# Patient Record
Sex: Female | Born: 1945 | Race: White | Hispanic: No | Smoking: Never smoker
Health system: Southern US, Community
[De-identification: ages and names within clinical notes are randomized; demographics above are authoritative.]

## PROBLEM LIST (undated history)

## (undated) DIAGNOSIS — F419 Anxiety disorder, unspecified: Secondary | ICD-10-CM

## (undated) DIAGNOSIS — I10 Essential (primary) hypertension: Secondary | ICD-10-CM

## (undated) DIAGNOSIS — Z9889 Other specified postprocedural states: Secondary | ICD-10-CM

## (undated) DIAGNOSIS — I499 Cardiac arrhythmia, unspecified: Secondary | ICD-10-CM

## (undated) DIAGNOSIS — Z87442 Personal history of urinary calculi: Secondary | ICD-10-CM

## (undated) DIAGNOSIS — M1990 Unspecified osteoarthritis, unspecified site: Secondary | ICD-10-CM

## (undated) DIAGNOSIS — R112 Nausea with vomiting, unspecified: Secondary | ICD-10-CM

## (undated) DIAGNOSIS — J189 Pneumonia, unspecified organism: Secondary | ICD-10-CM

## (undated) HISTORY — PX: OTHER SURGICAL HISTORY: SHX169

---

## 1998-06-29 ENCOUNTER — Other Ambulatory Visit: Admission: RE | Admit: 1998-06-29 | Discharge: 1998-06-29 | Payer: Self-pay | Admitting: Obstetrics and Gynecology

## 1998-08-03 ENCOUNTER — Ambulatory Visit (HOSPITAL_COMMUNITY): Admission: RE | Admit: 1998-08-03 | Discharge: 1998-08-03 | Payer: Self-pay | Admitting: Obstetrics and Gynecology

## 1998-09-29 ENCOUNTER — Ambulatory Visit (HOSPITAL_COMMUNITY): Admission: RE | Admit: 1998-09-29 | Discharge: 1998-09-29 | Payer: Self-pay | Admitting: Obstetrics and Gynecology

## 1999-09-21 ENCOUNTER — Other Ambulatory Visit: Admission: RE | Admit: 1999-09-21 | Discharge: 1999-09-21 | Payer: Self-pay | Admitting: Obstetrics and Gynecology

## 2000-11-05 ENCOUNTER — Other Ambulatory Visit: Admission: RE | Admit: 2000-11-05 | Discharge: 2000-11-05 | Payer: Self-pay | Admitting: Gynecology

## 2002-01-07 ENCOUNTER — Other Ambulatory Visit: Admission: RE | Admit: 2002-01-07 | Discharge: 2002-01-07 | Payer: Self-pay | Admitting: Gynecology

## 2016-08-03 ENCOUNTER — Ambulatory Visit (HOSPITAL_BASED_OUTPATIENT_CLINIC_OR_DEPARTMENT_OTHER): Payer: Self-pay | Admitting: Urology

## 2016-08-03 ENCOUNTER — Emergency Department (HOSPITAL_COMMUNITY): Payer: Medicare Other

## 2016-08-03 ENCOUNTER — Emergency Department (HOSPITAL_COMMUNITY): Payer: Medicare Other | Admitting: Certified Registered Nurse Anesthetist

## 2016-08-03 ENCOUNTER — Encounter (HOSPITAL_COMMUNITY)
Admission: EM | Disposition: A | Discharge status: Skilled Nursing Facility | Payer: Self-pay | Source: Home / Self Care | Attending: Internal Medicine

## 2016-08-03 ENCOUNTER — Inpatient Hospital Stay (HOSPITAL_COMMUNITY)
Admission: EM | Admit: 2016-08-03 | Discharge: 2016-08-09 | DRG: 871 | Disposition: A | Discharge status: Skilled Nursing Facility | Payer: Medicare Other | Attending: Internal Medicine | Admitting: Internal Medicine

## 2016-08-03 DIAGNOSIS — Z6831 Body mass index (BMI) 31.0-31.9, adult: Secondary | ICD-10-CM | POA: Diagnosis not present

## 2016-08-03 DIAGNOSIS — A4181 Sepsis due to Enterococcus: Secondary | ICD-10-CM | POA: Diagnosis not present

## 2016-08-03 DIAGNOSIS — F419 Anxiety disorder, unspecified: Secondary | ICD-10-CM | POA: Diagnosis present

## 2016-08-03 DIAGNOSIS — N132 Hydronephrosis with renal and ureteral calculous obstruction: Secondary | ICD-10-CM | POA: Diagnosis not present

## 2016-08-03 DIAGNOSIS — N136 Pyonephrosis: Secondary | ICD-10-CM | POA: Diagnosis present

## 2016-08-03 DIAGNOSIS — J9601 Acute respiratory failure with hypoxia: Secondary | ICD-10-CM | POA: Diagnosis not present

## 2016-08-03 DIAGNOSIS — R188 Other ascites: Secondary | ICD-10-CM | POA: Diagnosis not present

## 2016-08-03 DIAGNOSIS — I10 Essential (primary) hypertension: Secondary | ICD-10-CM | POA: Diagnosis present

## 2016-08-03 DIAGNOSIS — M6281 Muscle weakness (generalized): Secondary | ICD-10-CM | POA: Diagnosis not present

## 2016-08-03 DIAGNOSIS — J9811 Atelectasis: Secondary | ICD-10-CM | POA: Diagnosis present

## 2016-08-03 DIAGNOSIS — G9341 Metabolic encephalopathy: Secondary | ICD-10-CM | POA: Diagnosis present

## 2016-08-03 DIAGNOSIS — Z4682 Encounter for fitting and adjustment of non-vascular catheter: Secondary | ICD-10-CM | POA: Diagnosis not present

## 2016-08-03 DIAGNOSIS — K529 Noninfective gastroenteritis and colitis, unspecified: Secondary | ICD-10-CM | POA: Diagnosis present

## 2016-08-03 DIAGNOSIS — E162 Hypoglycemia, unspecified: Secondary | ICD-10-CM | POA: Diagnosis not present

## 2016-08-03 DIAGNOSIS — E861 Hypovolemia: Secondary | ICD-10-CM | POA: Diagnosis present

## 2016-08-03 DIAGNOSIS — R410 Disorientation, unspecified: Secondary | ICD-10-CM | POA: Diagnosis not present

## 2016-08-03 DIAGNOSIS — R7989 Other specified abnormal findings of blood chemistry: Secondary | ICD-10-CM | POA: Diagnosis present

## 2016-08-03 DIAGNOSIS — R140 Abdominal distension (gaseous): Secondary | ICD-10-CM | POA: Diagnosis not present

## 2016-08-03 DIAGNOSIS — R6520 Severe sepsis without septic shock: Secondary | ICD-10-CM

## 2016-08-03 DIAGNOSIS — N179 Acute kidney failure, unspecified: Secondary | ICD-10-CM | POA: Diagnosis present

## 2016-08-03 DIAGNOSIS — D696 Thrombocytopenia, unspecified: Secondary | ICD-10-CM | POA: Diagnosis present

## 2016-08-03 DIAGNOSIS — E669 Obesity, unspecified: Secondary | ICD-10-CM | POA: Diagnosis present

## 2016-08-03 DIAGNOSIS — R7881 Bacteremia: Secondary | ICD-10-CM | POA: Diagnosis present

## 2016-08-03 DIAGNOSIS — N202 Calculus of kidney with calculus of ureter: Secondary | ICD-10-CM | POA: Diagnosis not present

## 2016-08-03 DIAGNOSIS — E870 Hyperosmolality and hypernatremia: Secondary | ICD-10-CM | POA: Diagnosis present

## 2016-08-03 DIAGNOSIS — Z9289 Personal history of other medical treatment: Secondary | ICD-10-CM

## 2016-08-03 DIAGNOSIS — J9600 Acute respiratory failure, unspecified whether with hypoxia or hypercapnia: Secondary | ICD-10-CM | POA: Diagnosis not present

## 2016-08-03 DIAGNOSIS — J9690 Respiratory failure, unspecified, unspecified whether with hypoxia or hypercapnia: Secondary | ICD-10-CM | POA: Diagnosis not present

## 2016-08-03 DIAGNOSIS — F331 Major depressive disorder, recurrent, moderate: Secondary | ICD-10-CM | POA: Diagnosis not present

## 2016-08-03 DIAGNOSIS — N10 Acute tubulo-interstitial nephritis: Secondary | ICD-10-CM

## 2016-08-03 DIAGNOSIS — K922 Gastrointestinal hemorrhage, unspecified: Secondary | ICD-10-CM | POA: Diagnosis not present

## 2016-08-03 DIAGNOSIS — E872 Acidosis: Secondary | ICD-10-CM | POA: Diagnosis present

## 2016-08-03 DIAGNOSIS — E876 Hypokalemia: Secondary | ICD-10-CM | POA: Diagnosis present

## 2016-08-03 DIAGNOSIS — E860 Dehydration: Secondary | ICD-10-CM | POA: Diagnosis present

## 2016-08-03 DIAGNOSIS — K625 Hemorrhage of anus and rectum: Secondary | ICD-10-CM

## 2016-08-03 DIAGNOSIS — R404 Transient alteration of awareness: Secondary | ICD-10-CM | POA: Diagnosis not present

## 2016-08-03 DIAGNOSIS — I248 Other forms of acute ischemic heart disease: Secondary | ICD-10-CM | POA: Diagnosis present

## 2016-08-03 DIAGNOSIS — R030 Elevated blood-pressure reading, without diagnosis of hypertension: Secondary | ICD-10-CM | POA: Diagnosis present

## 2016-08-03 DIAGNOSIS — R6521 Severe sepsis with septic shock: Secondary | ICD-10-CM | POA: Diagnosis present

## 2016-08-03 DIAGNOSIS — A4151 Sepsis due to Escherichia coli [E. coli]: Principal | ICD-10-CM | POA: Diagnosis present

## 2016-08-03 DIAGNOSIS — K921 Melena: Secondary | ICD-10-CM | POA: Diagnosis not present

## 2016-08-03 DIAGNOSIS — A419 Sepsis, unspecified organism: Secondary | ICD-10-CM

## 2016-08-03 DIAGNOSIS — R918 Other nonspecific abnormal finding of lung field: Secondary | ICD-10-CM | POA: Diagnosis not present

## 2016-08-03 DIAGNOSIS — R2689 Other abnormalities of gait and mobility: Secondary | ICD-10-CM | POA: Diagnosis not present

## 2016-08-03 DIAGNOSIS — E161 Other hypoglycemia: Secondary | ICD-10-CM | POA: Diagnosis not present

## 2016-08-03 DIAGNOSIS — N12 Tubulo-interstitial nephritis, not specified as acute or chronic: Secondary | ICD-10-CM | POA: Diagnosis not present

## 2016-08-03 DIAGNOSIS — R4182 Altered mental status, unspecified: Secondary | ICD-10-CM | POA: Diagnosis not present

## 2016-08-03 DIAGNOSIS — Z452 Encounter for adjustment and management of vascular access device: Secondary | ICD-10-CM | POA: Diagnosis not present

## 2016-08-03 DIAGNOSIS — N200 Calculus of kidney: Secondary | ICD-10-CM | POA: Diagnosis not present

## 2016-08-03 HISTORY — PX: CYSTOSCOPY W/ URETERAL STENT PLACEMENT: SHX1429

## 2016-08-03 LAB — URINALYSIS, ROUTINE W REFLEX MICROSCOPIC
BILIRUBIN URINE: NEGATIVE
GLUCOSE, UA: NEGATIVE mg/dL
KETONES UR: NEGATIVE mg/dL
NITRITE: NEGATIVE
PH: 5.5 (ref 5.0–8.0)
PROTEIN: 100 mg/dL — AB
Specific Gravity, Urine: 1.025 (ref 1.005–1.030)

## 2016-08-03 LAB — COMPREHENSIVE METABOLIC PANEL
ALT: 70 U/L — AB (ref 14–54)
ANION GAP: 17 — AB (ref 5–15)
AST: 112 U/L — ABNORMAL HIGH (ref 15–41)
Albumin: 2.6 g/dL — ABNORMAL LOW (ref 3.5–5.0)
Alkaline Phosphatase: 188 U/L — ABNORMAL HIGH (ref 38–126)
BUN: 55 mg/dL — ABNORMAL HIGH (ref 6–20)
CHLORIDE: 100 mmol/L — AB (ref 101–111)
CO2: 17 mmol/L — AB (ref 22–32)
Calcium: 7.7 mg/dL — ABNORMAL LOW (ref 8.9–10.3)
Creatinine, Ser: 3.89 mg/dL — ABNORMAL HIGH (ref 0.44–1.00)
GFR, EST AFRICAN AMERICAN: 13 mL/min — AB (ref >60)
GFR, EST NON AFRICAN AMERICAN: 11 mL/min — AB (ref >60)
Glucose, Bld: 127 mg/dL — ABNORMAL HIGH (ref 65–99)
POTASSIUM: 3.1 mmol/L — AB (ref 3.5–5.1)
SODIUM: 134 mmol/L — AB (ref 135–145)
Total Bilirubin: 0.8 mg/dL (ref 0.3–1.2)
Total Protein: 6.1 g/dL — ABNORMAL LOW (ref 6.5–8.1)

## 2016-08-03 LAB — CBC WITH DIFFERENTIAL/PLATELET
BASOS ABS: 0 10*3/uL (ref 0.0–0.1)
Basophils Relative: 0 %
Eosinophils Absolute: 0 10*3/uL (ref 0.0–0.7)
Eosinophils Relative: 0 %
HCT: 33.8 % — ABNORMAL LOW (ref 36.0–46.0)
HEMOGLOBIN: 10.8 g/dL — AB (ref 12.0–15.0)
LYMPHS PCT: 5 %
Lymphs Abs: 0.3 10*3/uL — ABNORMAL LOW (ref 0.7–4.0)
MCH: 25.1 pg — AB (ref 26.0–34.0)
MCHC: 32 g/dL (ref 30.0–36.0)
MCV: 78.4 fL (ref 78.0–100.0)
MONOS PCT: 2 %
Monocytes Absolute: 0.1 10*3/uL (ref 0.1–1.0)
NEUTROS ABS: 5.5 10*3/uL (ref 1.7–7.7)
NEUTROS PCT: 93 %
PLATELETS: 41 10*3/uL — AB (ref 150–400)
RBC: 4.31 MIL/uL (ref 3.87–5.11)
RDW: 17.2 % — ABNORMAL HIGH (ref 11.5–15.5)
WBC MORPHOLOGY: INCREASED
WBC: 5.9 10*3/uL (ref 4.0–10.5)

## 2016-08-03 LAB — URINALYSIS, MICROSCOPIC (REFLEX)

## 2016-08-03 LAB — I-STAT CG4 LACTIC ACID, ED
LACTIC ACID, VENOUS: 6.64 mmol/L — AB (ref 0.5–1.9)
LACTIC ACID, VENOUS: 3.26 mmol/L — AB (ref 0.5–1.9)

## 2016-08-03 LAB — PROTIME-INR
INR: 1.64
PROTHROMBIN TIME: 19.6 s — AB (ref 11.4–15.2)

## 2016-08-03 LAB — CBG MONITORING, ED: Glucose-Capillary: 112 mg/dL — ABNORMAL HIGH (ref 65–99)

## 2016-08-03 SURGERY — CYSTOSCOPY, FLEXIBLE, WITH STENT REPLACEMENT
Anesthesia: General | Laterality: Bilateral

## 2016-08-03 MED ORDER — ROCURONIUM BROMIDE 10 MG/ML (PF) SYRINGE
PREFILLED_SYRINGE | INTRAVENOUS | Status: DC | PRN
  Administered 2016-08-03: 50 mg via INTRAVENOUS

## 2016-08-03 MED ORDER — PIPERACILLIN-TAZOBACTAM 3.375 G IVPB
3.3750 g | Freq: Three times a day (TID) | INTRAVENOUS | Status: DC
  Administered 2016-08-04 – 2016-08-07 (×10): 3.375 g via INTRAVENOUS
  Filled 2016-08-03 (×10): qty 50

## 2016-08-03 MED ORDER — SODIUM CHLORIDE 0.9 % IV BOLUS (SEPSIS)
1000.0000 mL | Freq: Once | INTRAVENOUS | Status: AC
  Administered 2016-08-03: 1000 mL via INTRAVENOUS

## 2016-08-03 MED ORDER — PHENYLEPHRINE 40 MCG/ML (10ML) SYRINGE FOR IV PUSH (FOR BLOOD PRESSURE SUPPORT)
PREFILLED_SYRINGE | INTRAVENOUS | Status: DC | PRN
  Administered 2016-08-03: 120 ug via INTRAVENOUS
  Administered 2016-08-03: 200 ug via INTRAVENOUS
  Administered 2016-08-03: 160 ug via INTRAVENOUS

## 2016-08-03 MED ORDER — VANCOMYCIN HCL IN DEXTROSE 1-5 GM/200ML-% IV SOLN: 1000.0000 mg | INTRAVENOUS | Status: DC

## 2016-08-03 MED ORDER — SODIUM CHLORIDE 0.9 % IV SOLN
Freq: Once | INTRAVENOUS | Status: AC
  Administered 2016-08-03: 22:00:00 via INTRAVENOUS

## 2016-08-03 MED ORDER — ACETAMINOPHEN 650 MG RE SUPP
650.0000 mg | Freq: Once | RECTAL | Status: AC
  Administered 2016-08-03: 650 mg via RECTAL
  Filled 2016-08-03: qty 1

## 2016-08-03 MED ORDER — PHENYLEPHRINE HCL 10 MG/ML IJ SOLN
INTRAVENOUS | Status: DC | PRN
  Administered 2016-08-03: 30 ug/min via INTRAVENOUS

## 2016-08-03 MED ORDER — VANCOMYCIN HCL IN DEXTROSE 1-5 GM/200ML-% IV SOLN
1000.0000 mg | Freq: Once | INTRAVENOUS | Status: AC
  Administered 2016-08-03: 1000 mg via INTRAVENOUS
  Filled 2016-08-03: qty 200

## 2016-08-03 MED ORDER — PIPERACILLIN-TAZOBACTAM 3.375 G IVPB 30 MIN
3.3750 g | Freq: Once | INTRAVENOUS | Status: AC
  Administered 2016-08-03: 3.375 g via INTRAVENOUS
  Filled 2016-08-03: qty 50

## 2016-08-03 MED ORDER — SODIUM CHLORIDE 0.9 % IR SOLN
Status: DC | PRN
  Administered 2016-08-03: 3000 mL via INTRAVESICAL

## 2016-08-03 MED ORDER — PROPOFOL 10 MG/ML IV BOLUS
INTRAVENOUS | Status: DC | PRN
  Administered 2016-08-03: 140 mg via INTRAVENOUS

## 2016-08-03 MED ORDER — SUCCINYLCHOLINE CHLORIDE 20 MG/ML IJ SOLN
INTRAMUSCULAR | Status: DC | PRN
  Administered 2016-08-03: 100 mg via INTRAVENOUS

## 2016-08-03 MED ORDER — SODIUM CHLORIDE 0.9 % IV BOLUS (SEPSIS)
250.0000 mL | Freq: Once | INTRAVENOUS | Status: AC
  Administered 2016-08-03: 250 mL via INTRAVENOUS

## 2016-08-03 MED ORDER — SODIUM CHLORIDE 0.9 % IV SOLN
INTRAVENOUS | Status: DC | PRN
  Administered 2016-08-03: 13 mL

## 2016-08-03 MED ORDER — FENTANYL CITRATE (PF) 100 MCG/2ML IJ SOLN
INTRAMUSCULAR | Status: DC | PRN
  Administered 2016-08-03: 100 ug via INTRAVENOUS

## 2016-08-03 MED ORDER — LIDOCAINE 2% (20 MG/ML) 5 ML SYRINGE
INTRAMUSCULAR | Status: DC | PRN
  Administered 2016-08-03: 50 mg via INTRAVENOUS

## 2016-08-03 MED ORDER — SODIUM BICARBONATE 8.4 % IV SOLN
INTRAVENOUS | Status: DC | PRN
  Administered 2016-08-03: 50 meq via INTRAVENOUS

## 2016-08-03 MED ORDER — MIDAZOLAM HCL 5 MG/5ML IJ SOLN
INTRAMUSCULAR | Status: DC | PRN
  Administered 2016-08-03: 2 mg via INTRAVENOUS

## 2016-08-03 MED ORDER — LACTATED RINGERS IV SOLN
INTRAVENOUS | Status: DC | PRN
  Administered 2016-08-03: 23:00:00 via INTRAVENOUS

## 2016-08-03 SURGICAL SUPPLY — 15 items
BAG URO CATCHER STRL LF (MISCELLANEOUS) ×2 IMPLANT
CATH FOLEY 2WAY SLVR  5CC 16FR (CATHETERS) ×1
CATH FOLEY 2WAY SLVR 5CC 16FR (CATHETERS) IMPLANT
CATH INTERMIT  6FR 70CM (CATHETERS) ×2 IMPLANT
CLOTH BEACON ORANGE TIMEOUT ST (SAFETY) ×2 IMPLANT
GLOVE BIOGEL M 8.0 STRL (GLOVE) ×2 IMPLANT
GOWN STRL REUS W/ TWL XL LVL3 (GOWN DISPOSABLE) ×1 IMPLANT
GOWN STRL REUS W/TWL XL LVL3 (GOWN DISPOSABLE) ×2
GUIDEWIRE STR DUAL SENSOR (WIRE) ×2 IMPLANT
MANIFOLD NEPTUNE II (INSTRUMENTS) ×2 IMPLANT
PACK CYSTO (CUSTOM PROCEDURE TRAY) ×2 IMPLANT
STENT URET 6FRX24 CONTOUR (STENTS) ×2 IMPLANT
SYR CONTROL 10ML LL (SYRINGE) ×1 IMPLANT
TUBING CONNECTING 10 (TUBING) ×2 IMPLANT
WATER STERILE IRR 500ML POUR (IV SOLUTION) ×1 IMPLANT

## 2016-08-03 NOTE — Discharge Instructions (Signed)

## 2016-08-03 NOTE — Progress Notes (Signed)
Called by [facility] ED physician Dr Oleta Mouse for admitting this patient tonight after trip to OR for ureteral stents. She presented with N/V/D and AMS.  In ED BP's good but tachy and tachypneic. Cr 3.8.  Has rec'd sepsis protocol IVF's and IV abx w vanc/ zosyn.  HR better. CT abd showed obstructing L renal stone with hydro, and extra renal pelvis on the R. Probable urosepsis.  Urology is going to take her from [facility] ED straight to OR for ureteral stent placement, then is to be admitted by [facility] after surgery.  Please call [facility] at [phone] after pt is done in the OR tonight.   Kelly Splinter MD [facility] 08/03/2016, 10:08 PM

## 2016-08-03 NOTE — ED Provider Notes (Addendum)
[place] DEPT Provider Note   CSN: [id number] Arrival date & time: 08/03/16  1659     History   Chief Complaint Chief Complaint  Patient presents with  . Altered Mental Status    HPI Regina Richards is a 70 y.o. female.  HPI Level V caveat due to AMS. History is provided by patient sisters. She has history of anxiety. They last saw her normal on Monday when she went to a concert with her sisters. On Tuesday she told them that she had some nausea and vomiting and diarrhea and was feeling unwell.  They have been unable to get a hold of her via telephone for the past 24 hours, and they came to find her today. She was sitting down without pants on, was confused and altered. Patient oriented to self. Unable to provide any history. Denies any pain.  No past medical history on file.  There are no active problems to display for this patient.   No past surgical history on file.  OB History    No data available       Home Medications    Prior to Admission medications   Not on File    Family History No family history on file.  reviewed, noncontributory  Social History Social History  Substance Use Topics  . Smoking status: Not on file  . Smokeless tobacco: Not on file  . Alcohol use Not on file     Allergies   Patient has no allergy information on record.   Review of Systems Review of Systems  Unable to obtain due to altered mental status  Physical Exam Updated Vital Signs BP 121/65   Pulse 116   Temp (S) 102 F (38.9 C) (Rectal)   Resp 22   Ht 5\' 2"  (1.575 m)   Wt 150 lb (68 kg)   SpO2 94%   BMI 27.44 kg/m   Physical Exam Physical Exam  Nursing note and vitals reviewed. Constitutional:  Diaphoretic, appears ill Head: Normocephalic and atraumatic.  Mouth/Throat: Oropharynx is clear and dry.  Neck: Normal range of motion. Neck supple.  Cardiovascular: Tachycardic rate and regular rhythm.  no edema Pulmonary/Chest: Tachypnea, difficult to  auscultate, grossly clear Abdominal: Soft. There is no tenderness. There is no rebound and no guarding.  Musculoskeletal: Normal range of motion.  Neurological: Alert, oriented only to self, no facial droop, moves all extremities symmetrically Skin: Skin is warm and dry.  Psychiatric: Cooperative   ED Treatments / Results  Labs (all labs ordered are listed, but only abnormal results are displayed) Labs Reviewed  COMPREHENSIVE METABOLIC PANEL - Abnormal; Notable for the following:       Result Value   Sodium 134 (*)    Potassium 3.1 (*)    Chloride 100 (*)    CO2 17 (*)    Glucose, Bld 127 (*)    BUN 55 (*)    Creatinine, Ser 3.89 (*)    Calcium 7.7 (*)    Total Protein 6.1 (*)    Albumin 2.6 (*)    AST 112 (*)    ALT 70 (*)    Alkaline Phosphatase 188 (*)    GFR calc non Af Amer 11 (*)    GFR calc Af Amer 13 (*)    Anion gap 17 (*)    All other components within normal limits  CBC WITH DIFFERENTIAL/PLATELET - Abnormal; Notable for the following:    Hemoglobin 10.8 (*)    HCT 33.8 (*)  MCH 25.1 (*)    RDW 17.2 (*)    Platelets 41 (*)    Lymphs Abs 0.3 (*)    All other components within normal limits  PROTIME-INR - Abnormal; Notable for the following:    Prothrombin Time 19.6 (*)    All other components within normal limits  URINALYSIS, ROUTINE W REFLEX MICROSCOPIC - Abnormal; Notable for the following:    APPearance HAZY (*)    Hgb urine dipstick LARGE (*)    Protein, ur 100 (*)    Leukocytes, UA MODERATE (*)    All other components within normal limits  URINALYSIS, MICROSCOPIC (REFLEX) - Abnormal; Notable for the following:    Bacteria, UA MANY (*)    Squamous Epithelial / LPF 0-5 (*)    All other components within normal limits  I-STAT CG4 LACTIC ACID, ED - Abnormal; Notable for the following:    Lactic Acid, Venous 6.64 (*)    All other components within normal limits  CBG MONITORING, ED - Abnormal; Notable for the following:    Glucose-Capillary 112 (*)      All other components within normal limits  CULTURE, BLOOD (ROUTINE X 2)  CULTURE, BLOOD (ROUTINE X 2)  URINE CULTURE  I-STAT CG4 LACTIC ACID, ED    EKG  EKG Interpretation  Date/Time:  Thursday August 03 2016 18:05:14 EST Ventricular Rate:  120 PR Interval:    QRS Duration: 82 QT Interval:  316 QTC Calculation: 447 R Axis:   54 Text Interpretation:  Sinus tachycardia Borderline repolarization abnormality other than tachycardia no other acute changes  Confirmed by Jehad Bisono MD, Sephira Zellman [phone]) on 08/03/2016 6:26:07 PM       Radiology Ct Abdomen Pelvis Wo Contrast  Result Date: 08/03/2016 CLINICAL DATA:  Sepsis with nausea, vomiting and diarrhea times 1-2 days. EXAM: CT ABDOMEN AND PELVIS WITHOUT CONTRAST TECHNIQUE: Multidetector CT imaging of the abdomen and pelvis was performed following the standard protocol without IV contrast. COMPARISON:  CXR from the same day FINDINGS: Lower chest: The visualized heart is within normal limits for size. There is no pericardial effusion. There is coronary arteriosclerosis as well as calcifications at the level of the aortic root. Trace bilateral pleural effusions. There is mild interstitial edema. There is bibasilar atelectasis with more confluent patchy airspace opacities in both lower lobes. Pneumonia is not excluded. Hepatobiliary: The unenhanced liver is unremarkable. The gallbladder is distended without calculi or wall thickening. No biliary dilatation. Pancreas: Unremarkable appearance of the pancreas. Spleen: No splenomegaly. Adrenals/Urinary Tract: There is a 4 mm left proximal ureteral stone causing mild to moderate left-sided hydronephrosis. There is an 8 x 9 mm nonobstructing right renal pelvic stone with extrarenal pelvis. Bilateral nephrolithiasis is noted. There are at least 3-4 punctate calcifications within the bladder which likely reflect previously passed stones. Bilateral perinephric fat stranding is noted. Cannot exclude urinary tract  infection and pyelonephritis given lack of IV contrast. Stomach/Bowel: Normal bowel rotation. No small bowel dilatation. Moderate stool burden in the rectosigmoid. There is mild transmural thickening of the sigmoid and colitis is not entirely exclude. Vascular/Lymphatic: The abdominal aorta is tortuous and partially calcified without aneurysm. Atherosclerosis of the left common iliac artery is also seen extending into the internal iliac. No pathologically enlarged lymph nodes. Reproductive: The uterus and adnexa are unremarkable. Other: Mild mesenteric fatty edema. Musculoskeletal: There is dextroconvex curvature of the lumbar spine with spondylosis. No acute osseous abnormality nor suspicious osseous abnormalities. IMPRESSION: 1. Bilateral nephrolithiasis with 4 mm left proximal ureteral stone  causing mild-to-moderate hydroureteronephrosis. There is an 8 x 9 mm nonobstructing right renal pelvic calculus within extrarenal pelvis. Bilateral perinephric fat stranding is seen. Cannot exclude urinary tract infection or pyelonephritis with these findings and in the absence of IV contrast. 2. Bilateral nephrolithiasis and tiny bladder calculi. 3. There is bibasilar patchy atelectasis with trace bilateral pleural effusions. More confluent areas of airspace opacity in both lower lobes cannot exclude pneumonia. 4. Mild transmural thickening of sigmoid colon. Findings are suspicious for colitis. Electronically Signed   By: Ashley Royalty M.D.   On: 08/03/2016 20:05   Ct Head Wo Contrast  Result Date: 08/03/2016 CLINICAL DATA:  70 year old female with confusion and altered mental status. Suspected sepsis. Initial encounter. EXAM: CT HEAD WITHOUT CONTRAST TECHNIQUE: Contiguous axial images were obtained from the base of the skull through the vertex without intravenous contrast. COMPARISON:  None. FINDINGS: Brain: Chronic encephalomalacia in the left frontal lobe along the anterior left frontal operculum. Mild ex vacuo  enlargement of the adjacent left lateral ventricle. Mild superimposed other white matter hypodensity, mostly along the frontal horns. No other cortical encephalomalacia identified. No midline shift, ventriculomegaly, mass effect, evidence of mass lesion, intracranial hemorrhage or evidence of cortically based acute infarction. Vascular: No suspicious intracranial vascular hyperdensity. Skull: No acute osseous abnormality identified. Sinuses/Orbits: Mild mucosal thickening right maxillary sinus. Trace fluid level right sphenoid sinus. Other Visualized paranasal sinuses and mastoids are well pneumatized. Other: Negative orbit and scalp soft tissues. IMPRESSION: 1.  No acute intracranial abnormality. 2. Chronic anterior division left MCA infarct. 3. Minimal right maxillary and sphenoid sinus inflammation, significance doubtful. Electronically Signed   By: Genevie Ann M.D.   On: 08/03/2016 18:34   Dg Chest Portable 1 View  Result Date: 08/03/2016 CLINICAL DATA:  Altered mental status EXAM: PORTABLE CHEST 1 VIEW COMPARISON:  None. FINDINGS: Low volume chest with streaky basilar opacities. No edema, effusion, or pneumothorax. Normal heart size and mediastinal contours when accounting for leftward rotation. IMPRESSION: Low volume chest with basilar atelectasis. Electronically Signed   By: Monte Fantasia M.D.   On: 08/03/2016 17:25    Procedures Procedures (including critical care time) CRITICAL CARE Performed by: Forde Dandy   Total critical care time: 45 minutes  Critical care time was exclusive of separately billable procedures and treating other patients.  Critical care was necessary to treat or prevent imminent or life-threatening deterioration.  Critical care was time spent personally by me on the following activities: development of treatment plan with patient and/or surrogate as well as nursing, discussions with consultants, evaluation of patient's response to treatment, examination of patient,  obtaining history from patient or surrogate, ordering and performing treatments and interventions, ordering and review of laboratory studies, ordering and review of radiographic studies, pulse oximetry and re-evaluation of patient's condition.  Medications Ordered in ED Medications  piperacillin-tazobactam (ZOSYN) IVPB 3.375 g (not administered)  vancomycin (VANCOCIN) IVPB 1000 mg/200 mL premix (not administered)  sodium chloride 0.9 % bolus 1,000 mL (0 mLs Intravenous Stopped 08/03/16 2117)    And  sodium chloride 0.9 % bolus 1,000 mL (0 mLs Intravenous Stopped 08/03/16 1812)    And  sodium chloride 0.9 % bolus 250 mL (0 mLs Intravenous Stopped 08/03/16 2117)  piperacillin-tazobactam (ZOSYN) IVPB 3.375 g (0 g Intravenous Stopped 08/03/16 1815)  vancomycin (VANCOCIN) IVPB 1000 mg/200 mL premix (0 mg Intravenous Stopped 08/03/16 2118)  acetaminophen (TYLENOL) suppository 650 mg (650 mg Rectal Given 08/03/16 1836)     Initial Impression / Assessment and  Plan / ED Course  I have reviewed the triage vital signs and the nursing notes.  Pertinent labs & imaging results that were available during my care of the patient were reviewed by me and considered in my medical decision making (see chart for details).  Clinical Course     Presenting with severe sepsis and code sepsis is paged on patient arrival. Patient confused, ill-appearing, with fever and tachycardia. Has been normotensive. With 2 L oxygen requirement. Sepsis workup is pursued, and she has notable lactic acidosis with a lactate above 6. She has acute kidney injury with a creatinine of 3.8. No leukocytosis but significant bandemia is noted. I received 30 mL/kg bolus of IV fluids, and started empirically on vancomycin and Zosyn.  Sepsis - Repeat Assessment  Performed at:   1800 08/03/2016   Vitals     Blood pressure 120/73, pulse 120, temperature (!) 96.1 F (35.6 C), temperature source Axillary, resp. rate 23, height 5\' 2"  (1.575 m),  weight 150 lb (68 kg), SpO2 99 %.  Sepsis - Repeat Assessment  Performed at:    1950 08/03/2016   Source of infection is due to infected ureteral stone. CT abdomen pelvis shows left 4 mm ureteral stone causing mild to moderate hydronephrosis. UA with evidence of infection. Discussed with Dr. Consuella Lose from urology who will plan on placing ureteral stents for source control. Plan to transfer to [place] and admit to hospitalist service afterward.  Final Clinical Impressions(s) / ED Diagnoses   Final diagnoses:  Severe sepsis ([place])  Acute kidney injury ([place])  Acute pyelonephritis  Ureteral stone with hydronephrosis    New Prescriptions New Prescriptions   No medications on file     Forde Dandy, MD 08/03/16 2133    Forde Dandy, MD 08/04/16 [phone]

## 2016-08-03 NOTE — Consult Note (Signed)
Urology Consult  CC: Referring physician: Forde Dandy, MD Reason for referral: Septic stone  Impression/Assessment: Left ureteral stone with obstruction, infected urine and sepsis - the patient is currently septic but stable.  She has a stone causing obstruction on the left side and source control is necessary.  In addition there is a stone free floating in the renal pelvis on the right side.  It is not causing obstruction currently however there is a significant enough risk for that to occur that I feel placement of a stent on both right and left sides is indicated at this time. I have discussed with the patient the procedure of cystoscopy and stent placement with the potential risks, complications and alternatives.  We discussed the probability of success as well as the anticipated postoperative course.  She will eventually need her stones treated once her infection has been controlled.   Plan:  1.  Emergent cystoscopy, bilateral retrograde pyelograms and bilateral double-J stent placement. 2.  Blood and urine cultures. 3.  Broad-spectrum antibiotics.    History of Present Illness:  Level V caveat due to AMS. History is provided by patient sisters. She has history of anxiety. They last saw her normal on Monday when she went to a concert with her sisters. On Tuesday she told them that she had some nausea and vomiting and diarrhea and was feeling unwell.  They have been unable to get a hold of her via telephone for the past 24 hours, and they came to find her today. She was sitting down without pants on, was confused and altered. Patient oriented to self. Unable to provide any history. Denies any pain.  She presented to the emergency room and was noted to be febrile to 102 and tachycardic.  Blood work was obtained which revealed an elevated lactic acid and her urinalysis appeared infected.  She was found to have an elevated creatinine of 3.89 and a CT scan was obtained which revealed a 4 mm stone  in the proximal left ureter with perinephric stranding noted.  She also had a stone located in the right renal pelvis.  It was not causing obstruction.    No past medical history on file. No past surgical history on file.  Medications: Prior to Admission:  (Not in a hospital admission)  Allergies: Allergies not on file  No family history on file.  Social History:  has no tobacco, alcohol, and drug history on file.  Review of Systems (10 point): Pertinent items are noted in HPI however patient was unable to give review of systems due to altered mental status.   Physical Exam:  Vital signs in last 24 hours: Temp:  [102 F (38.9 C)] 102 F (38.9 C) (12/07 1700) Pulse Rate:  [64-137] 116 (12/07 2045) Resp:  [21-31] 22 (12/07 2045) BP: (106-141)/(61-83) 121/65 (12/07 2045) SpO2:  [88 %-95 %] 94 % (12/07 2045) Weight:  [150 lb (68 kg)] 150 lb (68 kg) (12/07 1912) Constitutional:  Diaphoretic, appears ill Head: Normocephalic and atraumatic.  Mouth/Throat: Oropharynx is clear and dry.  Neck: Normal range of motion. Neck supple.  Cardiovascular: Tachycardic rate and regular rhythm.  no edema Pulmonary/Chest: Tachypnea, difficult to auscultate, grossly clear Abdominal: Soft. There is no tenderness. There is no rebound and no guarding.  Musculoskeletal: Normal range of motion.  Neurological: Alert, oriented only to self, no facial droop, moves all extremities symmetrically Skin: Skin is warm and dry.  Psychiatric: Cooperative  Laboratory Data:   Recent Labs  08/03/16 1655  WBC 5.9  HGB 10.8*  HCT 33.8*   BMET  Recent Labs  08/03/16 1655  NA 134*  K 3.1*  CL 100*  CO2 17*  GLUCOSE 127*  BUN 55*  CREATININE 3.89*  CALCIUM 7.7*    Recent Labs  08/03/16 1655  INR 1.64   No results for input(s): LABURIN in the last 72 hours. No results found for this or any previous visit. Creatinine:  Recent Labs  08/03/16 1655  CREATININE 3.89*    Imaging: Ct Abdomen  Pelvis Wo Contrast  Result Date: 08/03/2016 CLINICAL DATA:  Sepsis with nausea, vomiting and diarrhea times 1-2 days. EXAM: CT ABDOMEN AND PELVIS WITHOUT CONTRAST TECHNIQUE: Multidetector CT imaging of the abdomen and pelvis was performed following the standard protocol without IV contrast. COMPARISON:  CXR from the same day FINDINGS: Lower chest: The visualized heart is within normal limits for size. There is no pericardial effusion. There is coronary arteriosclerosis as well as calcifications at the level of the aortic root. Trace bilateral pleural effusions. There is mild interstitial edema. There is bibasilar atelectasis with more confluent patchy airspace opacities in both lower lobes. Pneumonia is not excluded. Hepatobiliary: The unenhanced liver is unremarkable. The gallbladder is distended without calculi or wall thickening. No biliary dilatation. Pancreas: Unremarkable appearance of the pancreas. Spleen: No splenomegaly. Adrenals/Urinary Tract: There is a 4 mm left proximal ureteral stone causing mild to moderate left-sided hydronephrosis. There is an 8 x 9 mm nonobstructing right renal pelvic stone with extrarenal pelvis. Bilateral nephrolithiasis is noted. There are at least 3-4 punctate calcifications within the bladder which likely reflect previously passed stones. Bilateral perinephric fat stranding is noted. Cannot exclude urinary tract infection and pyelonephritis given lack of IV contrast. Stomach/Bowel: Normal bowel rotation. No small bowel dilatation. Moderate stool burden in the rectosigmoid. There is mild transmural thickening of the sigmoid and colitis is not entirely exclude. Vascular/Lymphatic: The abdominal aorta is tortuous and partially calcified without aneurysm. Atherosclerosis of the left common iliac artery is also seen extending into the internal iliac. No pathologically enlarged lymph nodes. Reproductive: The uterus and adnexa are unremarkable. Other: Mild mesenteric fatty edema.  Musculoskeletal: There is dextroconvex curvature of the lumbar spine with spondylosis. No acute osseous abnormality nor suspicious osseous abnormalities. IMPRESSION: 1. Bilateral nephrolithiasis with 4 mm left proximal ureteral stone causing mild-to-moderate hydroureteronephrosis. There is an 8 x 9 mm nonobstructing right renal pelvic calculus within extrarenal pelvis. Bilateral perinephric fat stranding is seen. Cannot exclude urinary tract infection or pyelonephritis with these findings and in the absence of IV contrast. 2. Bilateral nephrolithiasis and tiny bladder calculi. 3. There is bibasilar patchy atelectasis with trace bilateral pleural effusions. More confluent areas of airspace opacity in both lower lobes cannot exclude pneumonia. 4. Mild transmural thickening of sigmoid colon. Findings are suspicious for colitis. Electronically Signed   By: Ashley Royalty M.D.   On: 08/03/2016 20:05   Ct Head Wo Contrast  Result Date: 08/03/2016 CLINICAL DATA:  70 year old female with confusion and altered mental status. Suspected sepsis. Initial encounter. EXAM: CT HEAD WITHOUT CONTRAST TECHNIQUE: Contiguous axial images were obtained from the base of the skull through the vertex without intravenous contrast. COMPARISON:  None. FINDINGS: Brain: Chronic encephalomalacia in the left frontal lobe along the anterior left frontal operculum. Mild ex vacuo enlargement of the adjacent left lateral ventricle. Mild superimposed other white matter hypodensity, mostly along the frontal horns. No other cortical encephalomalacia identified. No midline shift, ventriculomegaly, mass effect, evidence of mass lesion, intracranial  hemorrhage or evidence of cortically based acute infarction. Vascular: No suspicious intracranial vascular hyperdensity. Skull: No acute osseous abnormality identified. Sinuses/Orbits: Mild mucosal thickening right maxillary sinus. Trace fluid level right sphenoid sinus. Other Visualized paranasal sinuses and  mastoids are well pneumatized. Other: Negative orbit and scalp soft tissues. IMPRESSION: 1.  No acute intracranial abnormality. 2. Chronic anterior division left MCA infarct. 3. Minimal right maxillary and sphenoid sinus inflammation, significance doubtful. Electronically Signed   By: Genevie Ann M.D.   On: 08/03/2016 18:34   Dg Chest Portable 1 View  Result Date: 08/03/2016 CLINICAL DATA:  Altered mental status EXAM: PORTABLE CHEST 1 VIEW COMPARISON:  None. FINDINGS: Low volume chest with streaky basilar opacities. No edema, effusion, or pneumothorax. Normal heart size and mediastinal contours when accounting for leftward rotation. IMPRESSION: Low volume chest with basilar atelectasis. Electronically Signed   By: Monte Fantasia M.D.   On: 08/03/2016 17:25    CT scan images were independently reviewed as noted above.   Claybon Jabs 08/03/2016, 9:41 PM

## 2016-08-03 NOTE — H&P (Signed)
PULMONARY / CRITICAL CARE MEDICINE   Name: Regina Richards MRN: [id number] DOB: 1945/10/07    ADMISSION DATE:  08/03/2016 CONSULTATION DATE:  08/03/2016  REFERRING MD:  Sepsis secondary to obstructing nephrolithiasis.  CHIEF COMPLAINT:  AMS  HISTORY OF PRESENT ILLNESS:  Patient is encephalopathic and/or intubated. Therefore history has been obtained from chart review. 70 year old female with unknown medical history at this point. She apparently went to concert [id number]. Since that time she has had progressive nausea/vomiting and diarrhea. 12/7 her sisters attempted to contact her, which they had not been able to do for about 24 hours and went to her house where she was very confused. They brought her to ED where she was unable to provide any history. She was found to be febrile and tachycardic, bur normotensive. Lactic elevated at above 6 and she was given 60mL/Kg fluid bolus. She underwent CT to evaluate for septic source. CT abdomen showed 23mm L ureteral stone causing hydronephrosis. She was started on broad spectrum ABX and transferred to [facility] for urologic evaluation. She urgently underwent cytoscopy with bilateral double-J stent placement. Post-operatively she was left on ventilator and sent to ICU for recovery.   PAST MEDICAL HISTORY :  She  has no past medical history on file.  PAST SURGICAL HISTORY: She  has no past surgical history on file.  No Known Allergies  No current facility-administered medications on file prior to encounter.    No current outpatient prescriptions on file prior to encounter.    FAMILY HISTORY:  Her has no family status information on file.    SOCIAL HISTORY: She    REVIEW OF SYSTEMS:   Unable.   SUBJECTIVE:    VITAL SIGNS: BP 120/73   Pulse 120   Temp 97.8 F (36.6 C) (Oral)   Resp 23   Ht 5\' 2"  (1.575 m)   Wt 68 kg (150 lb)   SpO2 94%   BMI 27.44 kg/m   HEMODYNAMICS:    VENTILATOR SETTINGS:    INTAKE / OUTPUT: No intake/output data  recorded.  PHYSICAL EXAMINATION: General:  Obese female in NAD Neuro:  Sedated on vent HEENT:  [place]/AT, PERRL, no JVD Cardiovascular:  Tachy, regular, no MRG Lungs:  Clear Abdomen:  Soft, distended Musculoskeletal:  No acute deformity Skin:  Grossly intact  LABS:  BMET  Recent Labs Lab 08/03/16 1655  NA 134*  K 3.1*  CL 100*  CO2 17*  BUN 55*  CREATININE 3.89*  GLUCOSE 127*    Electrolytes  Recent Labs Lab 08/03/16 1655  CALCIUM 7.7*    CBC  Recent Labs Lab 08/03/16 1655  WBC 5.9  HGB 10.8*  HCT 33.8*  PLT 41*    Coag's  Recent Labs Lab 08/03/16 1655  INR 1.64    Sepsis Markers  Recent Labs Lab 08/03/16 1718 08/03/16 2134  LATICACIDVEN 6.64* 3.26*    ABG No results for input(s): PHART, PCO2ART, PO2ART in the last 168 hours.  Liver Enzymes  Recent Labs Lab 08/03/16 1655  AST 112*  ALT 70*  ALKPHOS 188*  BILITOT 0.8  ALBUMIN 2.6*    Cardiac Enzymes No results for input(s): TROPONINI, PROBNP in the last 168 hours.  Glucose  Recent Labs Lab 08/03/16 1703  GLUCAP 112*    Imaging Ct Abdomen Pelvis Wo Contrast  Result Date: 08/03/2016 CLINICAL DATA:  Sepsis with nausea, vomiting and diarrhea times 1-2 days. EXAM: CT ABDOMEN AND PELVIS WITHOUT CONTRAST TECHNIQUE: Multidetector CT imaging of the abdomen and pelvis  was performed following the standard protocol without IV contrast. COMPARISON:  CXR from the same day FINDINGS: Lower chest: The visualized heart is within normal limits for size. There is no pericardial effusion. There is coronary arteriosclerosis as well as calcifications at the level of the aortic root. Trace bilateral pleural effusions. There is mild interstitial edema. There is bibasilar atelectasis with more confluent patchy airspace opacities in both lower lobes. Pneumonia is not excluded. Hepatobiliary: The unenhanced liver is unremarkable. The gallbladder is distended without calculi or wall thickening. No biliary  dilatation. Pancreas: Unremarkable appearance of the pancreas. Spleen: No splenomegaly. Adrenals/Urinary Tract: There is a 4 mm left proximal ureteral stone causing mild to moderate left-sided hydronephrosis. There is an 8 x 9 mm nonobstructing right renal pelvic stone with extrarenal pelvis. Bilateral nephrolithiasis is noted. There are at least 3-4 punctate calcifications within the bladder which likely reflect previously passed stones. Bilateral perinephric fat stranding is noted. Cannot exclude urinary tract infection and pyelonephritis given lack of IV contrast. Stomach/Bowel: Normal bowel rotation. No small bowel dilatation. Moderate stool burden in the rectosigmoid. There is mild transmural thickening of the sigmoid and colitis is not entirely exclude. Vascular/Lymphatic: The abdominal aorta is tortuous and partially calcified without aneurysm. Atherosclerosis of the left common iliac artery is also seen extending into the internal iliac. No pathologically enlarged lymph nodes. Reproductive: The uterus and adnexa are unremarkable. Other: Mild mesenteric fatty edema. Musculoskeletal: There is dextroconvex curvature of the lumbar spine with spondylosis. No acute osseous abnormality nor suspicious osseous abnormalities. IMPRESSION: 1. Bilateral nephrolithiasis with 4 mm left proximal ureteral stone causing mild-to-moderate hydroureteronephrosis. There is an 8 x 9 mm nonobstructing right renal pelvic calculus within extrarenal pelvis. Bilateral perinephric fat stranding is seen. Cannot exclude urinary tract infection or pyelonephritis with these findings and in the absence of IV contrast. 2. Bilateral nephrolithiasis and tiny bladder calculi. 3. There is bibasilar patchy atelectasis with trace bilateral pleural effusions. More confluent areas of airspace opacity in both lower lobes cannot exclude pneumonia. 4. Mild transmural thickening of sigmoid colon. Findings are suspicious for colitis. Electronically Signed    By: Ashley Royalty M.D.   On: 08/03/2016 20:05   Ct Head Wo Contrast  Result Date: 08/03/2016 CLINICAL DATA:  70 year old female with confusion and altered mental status. Suspected sepsis. Initial encounter. EXAM: CT HEAD WITHOUT CONTRAST TECHNIQUE: Contiguous axial images were obtained from the base of the skull through the vertex without intravenous contrast. COMPARISON:  None. FINDINGS: Brain: Chronic encephalomalacia in the left frontal lobe along the anterior left frontal operculum. Mild ex vacuo enlargement of the adjacent left lateral ventricle. Mild superimposed other white matter hypodensity, mostly along the frontal horns. No other cortical encephalomalacia identified. No midline shift, ventriculomegaly, mass effect, evidence of mass lesion, intracranial hemorrhage or evidence of cortically based acute infarction. Vascular: No suspicious intracranial vascular hyperdensity. Skull: No acute osseous abnormality identified. Sinuses/Orbits: Mild mucosal thickening right maxillary sinus. Trace fluid level right sphenoid sinus. Other Visualized paranasal sinuses and mastoids are well pneumatized. Other: Negative orbit and scalp soft tissues. IMPRESSION: 1.  No acute intracranial abnormality. 2. Chronic anterior division left MCA infarct. 3. Minimal right maxillary and sphenoid sinus inflammation, significance doubtful. Electronically Signed   By: Genevie Ann M.D.   On: 08/03/2016 18:34   Dg Chest Portable 1 View  Result Date: 08/03/2016 CLINICAL DATA:  Altered mental status EXAM: PORTABLE CHEST 1 VIEW COMPARISON:  None. FINDINGS: Low volume chest with streaky basilar opacities. No edema, effusion, or  pneumothorax. Normal heart size and mediastinal contours when accounting for leftward rotation. IMPRESSION: Low volume chest with basilar atelectasis. Electronically Signed   By: Monte Fantasia M.D.   On: 08/03/2016 17:25     STUDIES:  CT abd 12/7 > Bilateral nephrolithiasis with 4 mm left proximal ureteral  stone causing mild-to-moderate hydroureteronephrosis. There is an 8 x 9 mm nonobstructing right renal pelvic calculus within extrarenal pelvis. There is bibasilar patchy atelectasis with trace bilateral pleural effusions.  CT head 12/7 > No acute intracranial abnormality. Chronic anterior division left MCA infarct. Minimal right maxillary and sphenoid sinus inflammation, significance doubtful.  CULTURES: Blood cultures 12/7 > Urine 12/7 >  ANTIBIOTICS: Zosyn 12/7 >> Vanco x1 12/7  SIGNIFICANT EVENTS: 12/7 admit, to OR for bilateral ureteral stent placement.  LINES/TUBES: ETT 12/8 >  DISCUSSION:   ASSESSMENT / PLAN:  PULMONARY A: Inability to protect airway in post-operative setting.  P:   Full vent support Follow CXR, ABG VAP bundle  CARDIOVASCULAR A:  Septic shock, likely a component of hypovolemia here as well  P:  Telemetry Map goal > 90mmHg Aggressive IVF phenylephrine for MAP goal, if dose increasing will place CVL, switch to norepinephrine. Ensure lactic clearing Strict I&O  RENAL A:   AKI likely post-renal in setting of below L hydronephrosis Bilateral nephrolithiasis Hypokalemia  P:   Hydrate Follow BMP Replete K  GASTROINTESTINAL A:   Nausea/vomiting Diarrhea  P:   NPO for now Monitor  HEMATOLOGIC A:   Anemia, unclear baseline Thrombocytopenia  P:  SCDs only for now Trend CBC  INFECTIOUS A:   Septic shock secondary to pyelo/hydronephrosis  P:   ABX as above Dc vanco Follow cultures  ENDOCRINE A:   No acute issues  P:    NEUROLOGIC A:   Anxiety P:   Holding home paroxetine Fentanyl gtt PRN versed  FAMILY  - Updates: sister updated bedside in ICU  - Inter-disciplinary family meet or Palliative Care meeting due by:  12/14   Georgann Housekeeper, AGACNP-BC [facility] Pager [phone] or [phone]  08/04/2016 12:32 AM

## 2016-08-03 NOTE — Progress Notes (Signed)
Pharmacy Antibiotic Note  Regina Richards is a 70 y.o. female admitted on 08/03/2016 with sepsis.  Pharmacy has been consulted for vancomycin and zosyn dosing.  Plan: Zosyn 3.375 gm iv q8h Vancomycin 1 g q48h Monitor cx, renal fx ,vt prn  Height: 5\' 2"  (157.5 cm) Weight: 150 lb (68 kg) IBW/kg (Calculated) : 50.1  Temp (24hrs), Avg:102 F (38.9 C), Min:102 F (38.9 C), Max:102 F (38.9 C)   Recent Labs Lab 08/03/16 1655 08/03/16 1718  WBC 5.9  --   CREATININE 3.89*  --   LATICACIDVEN  --  6.64*    Estimated Creatinine Clearance: 12.3 mL/min (by C-G formula based on SCr of 3.89 mg/dL (H)).    Allergies not on file  Levester Fresh, PharmD, BCPS, BCCCP Clinical Pharmacist Clinical phone for 08/03/2016 [phone] 08/03/2016 7:48 PM

## 2016-08-03 NOTE — ED Notes (Signed)
Pt transported to CT ?

## 2016-08-03 NOTE — ED Notes (Signed)
Pt being transferred by [facility] to [facility] OR. Report called to [place] RN. Pt family aware of transfer and consent signed.

## 2016-08-03 NOTE — Anesthesia Procedure Notes (Signed)
Procedure Name: Intubation Performed by: Justin Buechner J Pre-anesthesia Checklist: Patient identified, Emergency Drugs available, Suction available, Patient being monitored and Timeout performed Patient Re-evaluated:Patient Re-evaluated prior to inductionOxygen Delivery Method: Circle system utilized Preoxygenation: Pre-oxygenation with 100% oxygen Intubation Type: IV induction Ventilation: Mask ventilation without difficulty Laryngoscope Size: Mac and 4 Grade View: Grade II Tube type: Oral Tube size: 7.0 mm Number of attempts: 1 Airway Equipment and Method: Stylet Secured at: 21 cm Tube secured with: Tape Dental Injury: Teeth and Oropharynx as per pre-operative assessment      

## 2016-08-03 NOTE — ED Triage Notes (Signed)
Per EMS - pt from home, lives alone. Family went to visit pt today, noticed pt was not wearing pants and confused. Unknown if pt has fallen or had head injury. Pt confused upon initial assessment, inappropriately answering questions. Oriented only to self. Warm to touch, diaphoretic, tachycardic, tachypneic. Family reports pt having diarrhea x several days.

## 2016-08-03 NOTE — Op Note (Signed)
PATIENT:  Regina Richards  PRE-OPERATIVE DIAGNOSIS: 1. Obstructing left proximal ureteral stone. 2. Right renal pelvis stone 3. Florid sepsis  POST-OPERATIVE DIAGNOSIS: Same  PROCEDURE: 1. Cystoscopy with bilateral retrograde pyelograms including interpretation 2. Bilateral double-J stent placement  SURGEON:  Claybon Jabs  INDICATION: Regina Richards is a 70 year old female who was brought to the emergency room markedly confused and found to be septic. I was contacted regarding her obstructing left ureteral stone and we discussed the fact that source control was necessary. In discussing this with the ER physician I indicated that a percutaneous nephrostomy tube could be placed at [facility] however she felt the patient was stable enough to be transported to [facility] for a less invasive double-J stent placement. I discussed the case with her family who understand and elected to allow me to proceed.  ANESTHESIA:  General  EBL:  Minimal  DRAINS: 6 French, 24 cm double-J stents in the right and left ureters and a Foley catheter in the bladder  LOCAL MEDICATIONS USED:  None  SPECIMEN:  None  Description of procedure: After informed consent the patient was taken to the operating room and placed on the table in a supine position. General anesthesia was then administered. Once fully anesthetized the patient was moved to the dorsal lithotomy position and the genitalia were sterilely prepped and draped in standard fashion. An official timeout was then performed.  The 23 French cystoscope was then passed into the bladder. The urine was noted be cloudy and foul smelling. The bladder mucosa was normal appearing otherwise. Ureteral orifices were of normal position but somewhat flattened/effaced. A 6 French open-ended ureteral catheter was passed through the cystoscope and into the left ureteral orifice initially. Full-strength Omnipaque contrast was then used to perform a left retrograde pyelogram in the  standard fashion. While injecting full-strength contrast up the left ureter under direct fluoroscopy I noted a filling defect in the proximal ureter. Further contrast was not injected in order to maintain a low pressure.  A 0.038 inch floppy-tipped guidewire was then passed through the open ended catheter and up the left ureter beyond the stone into the area the renal pelvis under fluoroscopy. I removed the open-ended catheter and passed the double-J stent over the guidewire into the area the renal pelvis and as the guidewire was removed good curl was noted in the renal pelvis and bladder.  Attention was then directed to the right ureteral orifice and a right retrograde program was performed in the identical fashion. This revealed a normal ureter throughout its length. The intrarenal collecting system was normal. The stone in the renal pelvis was noted as a filling defect on the study. I then placed a stent on this side in identical fashion as to the contralateral side. I then drained the bladder, removed the cystoscope and placed a 16 French Foley catheter. The patient was then taken to the intensive care unit intubated. She tolerated the procedure well with no intraoperative complications.   PLAN OF CARE: Discharge to home after PACU  PATIENT DISPOSITION:  PACU - hemodynamically stable.

## 2016-08-03 NOTE — ED Notes (Signed)
Changed pt's brief 

## 2016-08-03 NOTE — Anesthesia Preprocedure Evaluation (Addendum)
Anesthesia Evaluation  Patient identified by MRN, date of birth, ID bandGeneral Assessment Comment:Has been confused acc to family  Reviewed: Patient's Chart, lab work & pertinent test results  Airway Mallampati: III  TM Distance: >3 FB Neck ROM: Full  Mouth opening: Limited Mouth Opening  Dental  (+) Teeth Intact   Pulmonary     + decreased breath sounds      Cardiovascular  Rhythm:Regular Rate:Tachycardia  tachycardic   Neuro/Psych    GI/Hepatic   Endo/Other    Renal/GU ARFRenal diseaseAcute pyelonephritis, sepsis likely increased CRT     Musculoskeletal   Abdominal (+) + obese,   Peds  Hematology  (+) anemia ,   Anesthesia Other Findings   Reproductive/Obstetrics                           Anesthesia Physical Anesthesia Plan  ASA: III and emergent  Anesthesia Plan: General   Post-op Pain Management:    Induction: Intravenous  Airway Management Planned: Oral ETT  Additional Equipment:   Intra-op Plan:   Post-operative Plan: Extubation in OR and Possible Post-op intubation/ventilation  Informed Consent: I have reviewed the patients History and Physical, chart, labs and discussed the procedure including the risks, benefits and alternatives for the proposed anesthesia with the patient or authorized representative who has indicated his/her understanding and acceptance.     Plan Discussed with:   Anesthesia Plan Comments:         Anesthesia Quick Evaluation

## 2016-08-04 ENCOUNTER — Inpatient Hospital Stay (HOSPITAL_COMMUNITY): Payer: Medicare Other

## 2016-08-04 ENCOUNTER — Encounter (HOSPITAL_COMMUNITY): Payer: Self-pay

## 2016-08-04 DIAGNOSIS — A419 Sepsis, unspecified organism: Secondary | ICD-10-CM | POA: Diagnosis present

## 2016-08-04 DIAGNOSIS — J9601 Acute respiratory failure with hypoxia: Secondary | ICD-10-CM

## 2016-08-04 DIAGNOSIS — N10 Acute tubulo-interstitial nephritis: Secondary | ICD-10-CM

## 2016-08-04 DIAGNOSIS — N179 Acute kidney failure, unspecified: Secondary | ICD-10-CM | POA: Diagnosis present

## 2016-08-04 DIAGNOSIS — R6521 Severe sepsis with septic shock: Secondary | ICD-10-CM

## 2016-08-04 LAB — ETHANOL: Alcohol, Ethyl (B): <5 mg/dL (ref <5)

## 2016-08-04 LAB — BLOOD GAS, VENOUS
Acid-base deficit: 5.8 mmol/L — ABNORMAL HIGH (ref 0.0–2.0)
Bicarbonate: 19.7 mmol/L — ABNORMAL LOW (ref 20.0–28.0)
Drawn by: 235321
FIO2: 40
MECHVT: 420 mL
O2 Saturation: 77.8 %
PATIENT TEMPERATURE: 98.6
PEEP: 5 cmH2O
PO2 VEN: 49.8 mmHg — AB (ref 32.0–45.0)
RATE: 30 resp/min
pCO2, Ven: 41.2 mmHg — ABNORMAL LOW (ref 44.0–60.0)
pH, Ven: 7.3 (ref 7.250–7.430)

## 2016-08-04 LAB — COMPREHENSIVE METABOLIC PANEL
ALT: 63 U/L — ABNORMAL HIGH (ref 14–54)
ANION GAP: 11 (ref 5–15)
AST: 96 U/L — AB (ref 15–41)
Albumin: 2.6 g/dL — ABNORMAL LOW (ref 3.5–5.0)
Alkaline Phosphatase: 92 U/L (ref 38–126)
BUN: 54 mg/dL — ABNORMAL HIGH (ref 6–20)
CHLORIDE: 108 mmol/L (ref 101–111)
CO2: 21 mmol/L — ABNORMAL LOW (ref 22–32)
Calcium: 7.1 mg/dL — ABNORMAL LOW (ref 8.9–10.3)
Creatinine, Ser: 3.06 mg/dL — ABNORMAL HIGH (ref 0.44–1.00)
GFR, EST AFRICAN AMERICAN: 17 mL/min — AB (ref >60)
GFR, EST NON AFRICAN AMERICAN: 15 mL/min — AB (ref >60)
Glucose, Bld: 87 mg/dL (ref 65–99)
POTASSIUM: 3.5 mmol/L (ref 3.5–5.1)
Sodium: 140 mmol/L (ref 135–145)
TOTAL PROTEIN: 5.6 g/dL — AB (ref 6.5–8.1)
Total Bilirubin: 0.9 mg/dL (ref 0.3–1.2)

## 2016-08-04 LAB — BLOOD GAS, ARTERIAL
Acid-base deficit: 7.6 mmol/L — ABNORMAL HIGH (ref 0.0–2.0)
BICARBONATE: 19.6 mmol/L — AB (ref 20.0–28.0)
Drawn by: 11249
FIO2: 100
LHR: 16 {breaths}/min
O2 Saturation: 98.5 %
PATIENT TEMPERATURE: 96.8
PCO2 ART: 48.4 mmHg — AB (ref 32.0–48.0)
PEEP: 5 cmH2O
VT: 420 mL
pH, Arterial: 7.225 — ABNORMAL LOW (ref 7.350–7.450)
pO2, Arterial: 170 mmHg — ABNORMAL HIGH (ref 83.0–108.0)

## 2016-08-04 LAB — BLOOD CULTURE ID PANEL (REFLEXED)
Acinetobacter baumannii: NOT DETECTED
CANDIDA ALBICANS: NOT DETECTED
CANDIDA GLABRATA: NOT DETECTED
Candida krusei: NOT DETECTED
Candida parapsilosis: NOT DETECTED
Candida tropicalis: NOT DETECTED
Carbapenem resistance: NOT DETECTED
ENTEROBACTER CLOACAE COMPLEX: NOT DETECTED
ENTEROBACTERIACEAE SPECIES: DETECTED — AB
ENTEROCOCCUS SPECIES: NOT DETECTED
ESCHERICHIA COLI: DETECTED — AB
Haemophilus influenzae: NOT DETECTED
Klebsiella oxytoca: NOT DETECTED
Klebsiella pneumoniae: NOT DETECTED
LISTERIA MONOCYTOGENES: NOT DETECTED
NEISSERIA MENINGITIDIS: NOT DETECTED
Proteus species: NOT DETECTED
Pseudomonas aeruginosa: NOT DETECTED
SERRATIA MARCESCENS: NOT DETECTED
STAPHYLOCOCCUS SPECIES: NOT DETECTED
STREPTOCOCCUS AGALACTIAE: NOT DETECTED
STREPTOCOCCUS PNEUMONIAE: NOT DETECTED
Staphylococcus aureus (BCID): NOT DETECTED
Streptococcus pyogenes: NOT DETECTED
Streptococcus species: NOT DETECTED

## 2016-08-04 LAB — CBC
HCT: 31.9 % — ABNORMAL LOW (ref 36.0–46.0)
Hemoglobin: 10 g/dL — ABNORMAL LOW (ref 12.0–15.0)
MCH: 25.2 pg — ABNORMAL LOW (ref 26.0–34.0)
MCHC: 31.3 g/dL (ref 30.0–36.0)
MCV: 80.4 fL (ref 78.0–100.0)
PLATELETS: 31 10*3/uL — AB (ref 150–400)
RBC: 3.97 MIL/uL (ref 3.87–5.11)
RDW: 17.5 % — AB (ref 11.5–15.5)
WBC: 15.5 10*3/uL — AB (ref 4.0–10.5)

## 2016-08-04 LAB — BASIC METABOLIC PANEL
Anion gap: 8 (ref 5–15)
BUN: 55 mg/dL — AB (ref 6–20)
CO2: 23 mmol/L (ref 22–32)
Calcium: 7.3 mg/dL — ABNORMAL LOW (ref 8.9–10.3)
Chloride: 109 mmol/L (ref 101–111)
Creatinine, Ser: 2.54 mg/dL — ABNORMAL HIGH (ref 0.44–1.00)
GFR calc Af Amer: 21 mL/min — ABNORMAL LOW (ref >60)
GFR, EST NON AFRICAN AMERICAN: 18 mL/min — AB (ref >60)
GLUCOSE: 102 mg/dL — AB (ref 65–99)
POTASSIUM: 4.2 mmol/L (ref 3.5–5.1)
Sodium: 140 mmol/L (ref 135–145)

## 2016-08-04 LAB — PHOSPHORUS
PHOSPHORUS: 5.5 mg/dL — AB (ref 2.5–4.6)
Phosphorus: 4.9 mg/dL — ABNORMAL HIGH (ref 2.5–4.6)

## 2016-08-04 LAB — MAGNESIUM
MAGNESIUM: 1.6 mg/dL — AB (ref 1.7–2.4)
Magnesium: 3 mg/dL — ABNORMAL HIGH (ref 1.7–2.4)

## 2016-08-04 LAB — LACTIC ACID, PLASMA
LACTIC ACID, VENOUS: 3.8 mmol/L — AB (ref 0.5–1.9)
LACTIC ACID, VENOUS: 1.5 mmol/L (ref 0.5–1.9)

## 2016-08-04 LAB — RAPID URINE DRUG SCREEN, HOSP PERFORMED
AMPHETAMINES: NOT DETECTED
BENZODIAZEPINES: POSITIVE — AB
Barbiturates: NOT DETECTED
COCAINE: NOT DETECTED
OPIATES: NOT DETECTED
TETRAHYDROCANNABINOL: NOT DETECTED

## 2016-08-04 LAB — MRSA PCR SCREENING: MRSA BY PCR: NEGATIVE

## 2016-08-04 MED ORDER — SODIUM CHLORIDE 0.9 % IV SOLN
25.0000 ug/h | INTRAVENOUS | Status: DC
  Administered 2016-08-04: 50 ug/h via INTRAVENOUS
  Filled 2016-08-04: qty 50

## 2016-08-04 MED ORDER — LORAZEPAM 0.5 MG PO TABS: 0.5000 mg | ORAL_TABLET | ORAL | Status: DC | PRN

## 2016-08-04 MED ORDER — CHLORHEXIDINE GLUCONATE 0.12% ORAL RINSE (MEDLINE KIT)
15.0000 mL | Freq: Two times a day (BID) | OROMUCOSAL | Status: DC
  Administered 2016-08-04 – 2016-08-06 (×5): 15 mL via OROMUCOSAL

## 2016-08-04 MED ORDER — ORAL CARE MOUTH RINSE
15.0000 mL | Freq: Four times a day (QID) | OROMUCOSAL | Status: DC
Start: 2016-08-04 — End: 2016-08-09
  Administered 2016-08-04 – 2016-08-09 (×16): 15 mL via OROMUCOSAL

## 2016-08-04 MED ORDER — SODIUM CHLORIDE 0.9 % IV SOLN
30.0000 meq | Freq: Once | INTRAVENOUS | Status: AC
  Administered 2016-08-04: 30 meq via INTRAVENOUS
  Filled 2016-08-04: qty 15

## 2016-08-04 MED ORDER — MIDAZOLAM HCL 2 MG/2ML IJ SOLN: 1.0000 mg | INTRAMUSCULAR | Status: DC | PRN

## 2016-08-04 MED ORDER — CHLORHEXIDINE GLUCONATE 0.12 % MT SOLN
OROMUCOSAL | Status: AC
  Administered 2016-08-04: 15 mL via OROMUCOSAL
  Filled 2016-08-04: qty 15

## 2016-08-04 MED ORDER — FENTANYL CITRATE (PF) 100 MCG/2ML IJ SOLN
50.0000 ug | Freq: Once | INTRAMUSCULAR | Status: AC
  Administered 2016-08-04: 50 ug via INTRAVENOUS

## 2016-08-04 MED ORDER — DEXMEDETOMIDINE HCL IN NACL 200 MCG/50ML IV SOLN: 0.2000 ug/kg/h | INTRAVENOUS | Status: DC

## 2016-08-04 MED ORDER — LIP MEDEX EX OINT
TOPICAL_OINTMENT | CUTANEOUS | Status: DC | PRN
  Filled 2016-08-04: qty 7

## 2016-08-04 MED ORDER — SODIUM CHLORIDE 0.9 % IV SOLN
INTRAVENOUS | Status: DC
  Administered 2016-08-04 – 2016-08-05 (×2): via INTRAVENOUS

## 2016-08-04 MED ORDER — SODIUM CHLORIDE 0.9 % IV BOLUS (SEPSIS)
1000.0000 mL | Freq: Once | INTRAVENOUS | Status: AC
  Administered 2016-08-04: 1000 mL via INTRAVENOUS

## 2016-08-04 MED ORDER — FAMOTIDINE IN NACL 20-0.9 MG/50ML-% IV SOLN
20.0000 mg | INTRAVENOUS | Status: DC
  Administered 2016-08-05 – 2016-08-06 (×3): 20 mg via INTRAVENOUS
  Filled 2016-08-04 (×3): qty 50

## 2016-08-04 MED ORDER — FENTANYL BOLUS VIA INFUSION
25.0000 ug | INTRAVENOUS | Status: DC | PRN
Start: 2016-08-04 — End: 2016-08-05
  Filled 2016-08-04: qty 25

## 2016-08-04 MED ORDER — MAGNESIUM SULFATE 4 GM/100ML IV SOLN
4.0000 g | Freq: Once | INTRAVENOUS | Status: AC
  Administered 2016-08-04: 4 g via INTRAVENOUS
  Filled 2016-08-04: qty 100

## 2016-08-04 MED ORDER — LORAZEPAM 2 MG/ML IJ SOLN
0.5000 mg | INTRAMUSCULAR | Status: DC | PRN
  Administered 2016-08-04: 1 mg via INTRAVENOUS
  Filled 2016-08-04: qty 1

## 2016-08-04 MED ORDER — DEXTROSE 5 % IV SOLN
2.0000 ug/min | INTRAVENOUS | Status: DC
  Administered 2016-08-04: 4 ug/min via INTRAVENOUS
  Filled 2016-08-04: qty 4

## 2016-08-04 NOTE — Progress Notes (Signed)
[place] Progress Note Patient Name: Regina Richards DOB: [birth year] MRN: [id number]   Date of Service  08/04/2016  HPI/Events of Note  Hypomag  eICU Interventions  Mag replaced     Intervention Category Intermediate Interventions: Electrolyte abnormality - evaluation and management  DETERDING,ELIZABETH 08/04/2016, 3:15 AM

## 2016-08-04 NOTE — Progress Notes (Signed)
PHARMACY - PHYSICIAN COMMUNICATION CRITICAL VALUE ALERT - BLOOD CULTURE IDENTIFICATION (BCID)  Results for orders placed or performed during the hospital encounter of 08/03/16  Blood Culture ID Panel (Reflexed) (Collected: 08/03/2016  5:30 PM)  Result Value Ref Range   Enterococcus species NOT DETECTED NOT DETECTED   Listeria monocytogenes NOT DETECTED NOT DETECTED   Staphylococcus species NOT DETECTED NOT DETECTED   Staphylococcus aureus NOT DETECTED NOT DETECTED   Streptococcus species NOT DETECTED NOT DETECTED   Streptococcus agalactiae NOT DETECTED NOT DETECTED   Streptococcus pneumoniae NOT DETECTED NOT DETECTED   Streptococcus pyogenes NOT DETECTED NOT DETECTED   Acinetobacter baumannii NOT DETECTED NOT DETECTED   Enterobacteriaceae species DETECTED (A) NOT DETECTED   Enterobacter cloacae complex NOT DETECTED NOT DETECTED   Escherichia coli DETECTED (A) NOT DETECTED   Klebsiella oxytoca NOT DETECTED NOT DETECTED   Klebsiella pneumoniae NOT DETECTED NOT DETECTED   Proteus species NOT DETECTED NOT DETECTED   Serratia marcescens NOT DETECTED NOT DETECTED   Carbapenem resistance NOT DETECTED NOT DETECTED   Haemophilus influenzae NOT DETECTED NOT DETECTED   Neisseria meningitidis NOT DETECTED NOT DETECTED   Pseudomonas aeruginosa NOT DETECTED NOT DETECTED   Candida albicans NOT DETECTED NOT DETECTED   Candida glabrata NOT DETECTED NOT DETECTED   Candida krusei NOT DETECTED NOT DETECTED   Candida parapsilosis NOT DETECTED NOT DETECTED   Candida tropicalis NOT DETECTED NOT DETECTED   Name of physician (or Provider) Contacted: Minor, Woody Seller  Changes to prescribed antibiotics required: none  Nyoka Cowden, Megha Agnes L 08/04/2016  9:05 AM

## 2016-08-04 NOTE — Transfer of Care (Signed)
Immediate Anesthesia Transfer of Care Note  Patient: Regina Richards  Procedure(s) Performed: Procedure(s): CYSTOSCOPY WITH BILATERAL STENT REPLACEMENT, BILATERAL RETROGRADE PYELOGRAM (Bilateral)  Patient Location: ICU  Anesthesia Type:General  Level of Consciousness: Patient remains intubated per anesthesia plan  Airway & Oxygen Therapy: Patient remains intubated per anesthesia plan  Post-op Assessment: Report given to RN and Post -op Vital signs reviewed and stable  Post vital signs: Reviewed and stable  Last Vitals:  Vitals:   08/03/16 2146 08/03/16 2200  BP: 126/78 120/73  Pulse: 120 120  Resp: 23 23  Temp: 36.6 C     Last Pain:  Vitals:   08/03/16 2216  TempSrc:   PainSc: 0-No pain         Complications: No apparent anesthesia complications

## 2016-08-04 NOTE — H&P (Signed)
PULMONARY / CRITICAL CARE MEDICINE   Name: Regina Richards MRN: [id number] DOB: [birth year]    ADMISSION DATE:  08/03/2016 CONSULTATION DATE:  08/03/2016  REFERRING MD:  Sepsis secondary to obstructing nephrolithiasis.  CHIEF COMPLAINT:  AMS  HISTORY OF PRESENT ILLNESS:  Patient is encephalopathic and/or intubated. Therefore history has been obtained from chart review. 70 year old female with unknown medical history at this point. She apparently went to concert [id number]. Since that time she has had progressive nausea/vomiting and diarrhea. 12/7 her sisters attempted to contact her, which they had not been able to do for about 24 hours and went to her house where she was very confused. They brought her to ED where she was unable to provide any history. She was found to be febrile and tachycardic, bur normotensive. Lactic elevated at above 6 and she was given 56mL/Kg fluid bolus. She underwent CT to evaluate for septic source. CT abdomen showed 48mm L ureteral stone causing hydronephrosis. She was started on broad spectrum ABX and transferred to [facility] for urologic evaluation. She urgently underwent cytoscopy with bilateral double-J stent placement. Post-operatively she was left on ventilator and sent to ICU for recovery.     SUBJECTIVE:   Awake and alert, placed on PS ventilation  VITAL SIGNS: BP (!) 87/51 (BP Location: Left Arm)   Pulse 89   Temp 97.7 F (36.5 C) (Oral)   Resp (!) 30   Ht 5\' 2"  (1.575 m)   Wt 167 lb 15.9 oz (76.2 kg)   LMP  (LMP Unknown) Comment: Pt is intubated and unable to tell me her LMP  SpO2 96%   BMI 30.73 kg/m   HEMODYNAMICS: CVP:  [2 mmHg-36 mmHg] 2 mmHg  VENTILATOR SETTINGS: Vent Mode: PSV;CPAP FiO2 (%):  [40 %-100 %] 40 % Set Rate:  [16 bmp-30 bmp] 30 bmp Vt Set:  [420 mL] 420 mL PEEP:  [5 cmH20] 5 cmH20 Pressure Support:  [5 cmH20] 5 cmH20 Plateau Pressure:  [18 cmH20-19 cmH20] 19 cmH20  INTAKE / OUTPUT: I/O last 3 completed shifts: In: 4418.8  [I.V.:1803.8; IV Piggyback:2615] Out: -   PHYSICAL EXAMINATION: General:  Obese female in NAD Neuro:  Sedated on vent, but awake and interactive HEENT:  [place]/AT, PERRL, no JVD Cardiovascular:  Tachy, regular, no MRG Lungs:  Clear Abdomen:  Soft, distended Musculoskeletal:  No acute deformity Skin:  Grossly intact  LABS:  BMET  Recent Labs Lab 08/03/16 1655 08/04/16 0201  NA 134* 140  K 3.1* 3.5  CL 100* 108  CO2 17* 21*  BUN 55* 54*  CREATININE 3.89* 3.06*  GLUCOSE 127* 87    Electrolytes  Recent Labs Lab 08/03/16 1655 08/04/16 0201  CALCIUM 7.7* 7.1*  MG  --  1.6*  PHOS  --  5.5*    CBC  Recent Labs Lab 08/03/16 1655 08/04/16 0201  WBC 5.9 15.5*  HGB 10.8* 10.0*  HCT 33.8* 31.9*  PLT 41* 31*    Coag's  Recent Labs Lab 08/03/16 1655  INR 1.64    Sepsis Markers  Recent Labs Lab 08/03/16 1718 08/03/16 2134 08/04/16 0201  LATICACIDVEN 6.64* 3.26* 3.8*    ABG  Recent Labs Lab 08/04/16 0030  PHART 7.225*  PCO2ART 48.4*  PO2ART 170*    Liver Enzymes  Recent Labs Lab 08/03/16 1655 08/04/16 0201  AST 112* 96*  ALT 70* 63*  ALKPHOS 188* 92  BILITOT 0.8 0.9  ALBUMIN 2.6* 2.6*    Cardiac Enzymes No results for input(s): TROPONINI, PROBNP  in the last 168 hours.  Glucose  Recent Labs Lab 08/03/16 1703  GLUCAP 112*    Imaging Ct Abdomen Pelvis Wo Contrast  Result Date: 08/03/2016 CLINICAL DATA:  Sepsis with nausea, vomiting and diarrhea times 1-2 days. EXAM: CT ABDOMEN AND PELVIS WITHOUT CONTRAST TECHNIQUE: Multidetector CT imaging of the abdomen and pelvis was performed following the standard protocol without IV contrast. COMPARISON:  CXR from the same day FINDINGS: Lower chest: The visualized heart is within normal limits for size. There is no pericardial effusion. There is coronary arteriosclerosis as well as calcifications at the level of the aortic root. Trace bilateral pleural effusions. There is mild interstitial  edema. There is bibasilar atelectasis with more confluent patchy airspace opacities in both lower lobes. Pneumonia is not excluded. Hepatobiliary: The unenhanced liver is unremarkable. The gallbladder is distended without calculi or wall thickening. No biliary dilatation. Pancreas: Unremarkable appearance of the pancreas. Spleen: No splenomegaly. Adrenals/Urinary Tract: There is a 4 mm left proximal ureteral stone causing mild to moderate left-sided hydronephrosis. There is an 8 x 9 mm nonobstructing right renal pelvic stone with extrarenal pelvis. Bilateral nephrolithiasis is noted. There are at least 3-4 punctate calcifications within the bladder which likely reflect previously passed stones. Bilateral perinephric fat stranding is noted. Cannot exclude urinary tract infection and pyelonephritis given lack of IV contrast. Stomach/Bowel: Normal bowel rotation. No small bowel dilatation. Moderate stool burden in the rectosigmoid. There is mild transmural thickening of the sigmoid and colitis is not entirely exclude. Vascular/Lymphatic: The abdominal aorta is tortuous and partially calcified without aneurysm. Atherosclerosis of the left common iliac artery is also seen extending into the internal iliac. No pathologically enlarged lymph nodes. Reproductive: The uterus and adnexa are unremarkable. Other: Mild mesenteric fatty edema. Musculoskeletal: There is dextroconvex curvature of the lumbar spine with spondylosis. No acute osseous abnormality nor suspicious osseous abnormalities. IMPRESSION: 1. Bilateral nephrolithiasis with 4 mm left proximal ureteral stone causing mild-to-moderate hydroureteronephrosis. There is an 8 x 9 mm nonobstructing right renal pelvic calculus within extrarenal pelvis. Bilateral perinephric fat stranding is seen. Cannot exclude urinary tract infection or pyelonephritis with these findings and in the absence of IV contrast. 2. Bilateral nephrolithiasis and tiny bladder calculi. 3. There is  bibasilar patchy atelectasis with trace bilateral pleural effusions. More confluent areas of airspace opacity in both lower lobes cannot exclude pneumonia. 4. Mild transmural thickening of sigmoid colon. Findings are suspicious for colitis. Electronically Signed   By: Ashley Royalty M.D.   On: 08/03/2016 20:05   Ct Head Wo Contrast  Result Date: 08/03/2016 CLINICAL DATA:  70 year old female with confusion and altered mental status. Suspected sepsis. Initial encounter. EXAM: CT HEAD WITHOUT CONTRAST TECHNIQUE: Contiguous axial images were obtained from the base of the skull through the vertex without intravenous contrast. COMPARISON:  None. FINDINGS: Brain: Chronic encephalomalacia in the left frontal lobe along the anterior left frontal operculum. Mild ex vacuo enlargement of the adjacent left lateral ventricle. Mild superimposed other white matter hypodensity, mostly along the frontal horns. No other cortical encephalomalacia identified. No midline shift, ventriculomegaly, mass effect, evidence of mass lesion, intracranial hemorrhage or evidence of cortically based acute infarction. Vascular: No suspicious intracranial vascular hyperdensity. Skull: No acute osseous abnormality identified. Sinuses/Orbits: Mild mucosal thickening right maxillary sinus. Trace fluid level right sphenoid sinus. Other Visualized paranasal sinuses and mastoids are well pneumatized. Other: Negative orbit and scalp soft tissues. IMPRESSION: 1.  No acute intracranial abnormality. 2. Chronic anterior division left MCA infarct. 3. Minimal right maxillary  and sphenoid sinus inflammation, significance doubtful. Electronically Signed   By: Genevie Ann M.D.   On: 08/03/2016 18:34   Dg Chest Port 1 View  Result Date: 08/04/2016 CLINICAL DATA:  Central line placement EXAM: PORTABLE CHEST 1 VIEW COMPARISON:  08/04/2016 at 00:17 FINDINGS: Right jugular central line has been placed. Tip is in the expected location of the SVC just below the azygos vein  junction. Endotracheal tube tip is 3.6 cm above the carina. Nasogastric tube extends into the stomach and beyond the inferior edge of the image. Dense consolidation persists in the left base with milder curvilinear opacity in the right base, unchanged. No pneumothorax. IMPRESSION: 1. New right jugular central line.  No pneumothorax. 2. ET and NG tubes appear satisfactorily positioned. 3. No significant interval change in the bilateral airspace opacities. Electronically Signed   By: Andreas Newport M.D.   On: 08/04/2016 01:59   Dg Chest Port 1 View  Result Date: 08/04/2016 CLINICAL DATA:  Encephalopathy.  Intubated. EXAM: PORTABLE CHEST 1 VIEW COMPARISON:  08/03/2016 FINDINGS: Endotracheal tube is 4.6 cm above the carina. Nasogastric tube extends into the stomach and beyond the inferior edge of the image. Airspace opacities are present in both bases, worsened on the left were there now was more confluent consolidation. Probable small left pleural effusion. No pneumothorax. IMPRESSION: 1.  Support equipment appears satisfactorily positioned. 2. Worsening airspace opacity in the left base. Electronically Signed   By: Andreas Newport M.D.   On: 08/04/2016 00:59   Dg Chest Portable 1 View  Result Date: 08/03/2016 CLINICAL DATA:  Altered mental status EXAM: PORTABLE CHEST 1 VIEW COMPARISON:  None. FINDINGS: Low volume chest with streaky basilar opacities. No edema, effusion, or pneumothorax. Normal heart size and mediastinal contours when accounting for leftward rotation. IMPRESSION: Low volume chest with basilar atelectasis. Electronically Signed   By: Monte Fantasia M.D.   On: 08/03/2016 17:25   Dg Abd Portable 1v  Result Date: 08/04/2016 CLINICAL DATA:  Abdominal distension. EXAM: PORTABLE ABDOMEN - 1 VIEW COMPARISON:  CT scan of August 03, 2016. FINDINGS: The bowel gas pattern is normal. Distal tip of nasogastric tube is seen in expected position of distal stomach. Bilateral ureteral stents are  noted. Right renal calculi are noted as described on prior CT scan. IMPRESSION: Right nephrolithiasis. Bilateral ureteral stents are noted. No evidence of bowel obstruction or ileus. Electronically Signed   By: Marijo Conception, M.D.   On: 08/04/2016 08:35     STUDIES:  CT abd 12/7 > Bilateral nephrolithiasis with 4 mm left proximal ureteral stone causing mild-to-moderate hydroureteronephrosis. There is an 8 x 9 mm nonobstructing right renal pelvic calculus within extrarenal pelvis. There is bibasilar patchy atelectasis with trace bilateral pleural effusions.  CT head 12/7 > No acute intracranial abnormality. Chronic anterior division left MCA infarct. Minimal right maxillary and sphenoid sinus inflammation, significance doubtful.  CULTURES: Blood cultures 12/7 > Urine 12/7 >  ANTIBIOTICS: Zosyn 12/7 >> Vanco x1 12/7  SIGNIFICANT EVENTS: 12/7 admit, to OR for bilateral ureteral stent placement.  LINES/TUBES: ETT 12/8 >  DISCUSSION: Awake and alert.  ASSESSMENT / PLAN:  PULMONARY A: Inability to protect airway in post-operative setting.  P:   Full vent support Follow CXR, ABG Vent bundle Wean to extubate    CARDIOVASCULAR A:  Septic shock, likely a component of hypovolemia here as well  P:  Telemetry Map goal > 45mmHg Aggressive IVF CVL with cvp 2 IVF Wean norepinephrine. Ensure lactic clearing Strict I&O  RENAL Lab Results  Component Value Date   CREATININE 3.06 (H) 08/04/2016   CREATININE 3.89 (H) 08/03/2016    Recent Labs Lab 08/03/16 1655 08/04/16 0201  K 3.1* 3.5     A:   AKI likely post-renal in setting of below L hydronephrosis Bilateral nephrolithiasis Hypokalemia Post bilateral stent placement  P:   Hydrate Follow BMP Replete K  GASTROINTESTINAL A:   Nausea/vomiting Diarrhea  P:   NPO for now Monitor  HEMATOLOGIC  Recent Labs  08/03/16 1655 08/04/16 0201  HGB 10.8* 10.0*    A:   Anemia, unclear  baseline Thrombocytopenia  P:  SCDs only for now Trend CBC  INFECTIOUS A:   Septic shock secondary to pyelo/hydronephrosis  P:   ABX as above Dc vanco Follow cultures  ENDOCRINE A:   No acute issues  P:    NEUROLOGIC A:   Anxiety Awake and alert P:   Holding home paroxetine Fentanyl gtt PRN versed  FAMILY  - Updates: sister updated bedside in ICU  - Inter-disciplinary family meet or Palliative Care meeting due by:  12/14   -CCT=30 min  Richardson Landry Minor ACNP Maryanna Shape PCCM Pager [phone] till 3 pm If no answer page [phone] 08/04/2016, 9:12 AM  ATTENDING NOTE / ATTESTATION NOTE :   I have discussed the case with the resident/APP  Richardson Landry Minor NP.   I agree with the resident/APP's  history, physical examination, assessment, and plans.    Briefly, patient admitted with worsening nausea, vomiting and abdominal pain. Workup revealed L hydronephrosis, bilateral nephrolithiasis. She presented with shock related to sepsis related to UTI related to kidney stones as well as hypovolemic shock.  Urology placed bilateral ureteral stents. She was hypotensive postoperatively and was placed on Neo-Synephrine which has since been weaned off. She did well on pressure support trial and has been extubated and she looks well.  Blood cultures growing Escherichia coli. Lactic acid is trending down. Still with acute kidney injury and leukocytosis. I anticipate she will tolerate extubation and transfer to the floors in the morning unless issues. Continue IV fluids. Continue antibiotics with Zosyn. Will d/c Vanc.  Check lytes today. Trend lactic acid.   I have edited the above note and modified it according to our agreed history, physical examination, assessment and plan.   I have spent 30  minutes of critical care time with this patient today.  Family :Family updated at length today.  Updated sisters at bedside.  Monica Becton, MD 08/04/2016, 12:40 PM [facility] Pager [phone] After 3 pm or if no answer, call [phone]

## 2016-08-04 NOTE — Progress Notes (Signed)
CRITICAL VALUE ALERT  Critical value received:  Lactic acid 3.8  Date of notification:  08/04/2016  Time of notification:  0313  Critical value read back:Yes.    Nurse who received alert:  Isabelle Course RN  MD notified (1st page):  CCMD  Time of first page:  0326  MD notified (2nd page):  Time of second page:  Responding MD:  ccmd  Time MD responded:  [phone]

## 2016-08-04 NOTE — Consult Note (Signed)
Assessment: Obstructing left ureteral stone and right renal pelvic stone with sepsis - double-J stents replaced last night and she has demonstrated some improvement in her renal function. She is much more alert and appears to have clinically improved. She is off pressors but remains intubated with anticipation of his getting her off of the ventilator today. She remains on broad spectrum antibiotics with cultures pending. She seems to be tolerating her stents with no pain or irritation.  Plan: 1. Continue broad-spectrum antibiotics. 2. Await culture results. 3. She will eventually follow up with me as an outpatient in order to schedule definitive treatment of her bilateral calculi.    Subjective: Patient remains intubated but is responsive and more alert. She does follow commands. She indicates she is not experiencing any pain.  Objective: Vital signs in last 24 hours: Temp:  [96.1 F (35.6 C)-102 F (38.9 C)] 97.1 F (36.2 C) (12/08 0406) Pulse Rate:  [64-137] 89 (12/08 0800) Resp:  [16-31] 30 (12/08 0800) BP: (74-173)/(49-110) 87/51 (12/08 0800) SpO2:  [88 %-100 %] 96 % (12/08 0800) FiO2 (%):  [40 %-100 %] 40 % (12/08 0800) Weight:  [150 lb (68 kg)-167 lb 15.9 oz (76.2 kg)] 167 lb 15.9 oz (76.2 kg) (12/08 0305)A  Intake/Output from previous day: 12/07 0701 - 12/08 0700 In: 4418.8 [I.V.:1803.8; IV Piggyback:2615] Out: -  Intake/Output this shift: No intake/output data recorded.  No past medical history on file.  Physical Exam:  Lungs - Remains intubated, chest expands symmetrically.  Abdomen - Soft, non-tender & non-distended. GU - Catheter draining clear urine.  Lab Results:  Recent Labs  08/03/16 1655 08/04/16 0201  WBC 5.9 15.5*  HGB 10.8* 10.0*  HCT 33.8* 31.9*   BMET  Recent Labs  08/03/16 1655 08/04/16 0201  NA 134* 140  K 3.1* 3.5  CL 100* 108  CO2 17* 21*  GLUCOSE 127* 87  BUN 55* 54*  CREATININE 3.89* 3.06*  CALCIUM 7.7* 7.1*   No results for  input(s): LABURIN in the last 72 hours. Results for orders placed or performed during the hospital encounter of 08/03/16  Culture, blood (Routine x 2)     Status: None (Preliminary result)   Collection Time: 08/03/16  5:20 PM  Result Value Ref Range Status   Specimen Description BLOOD RIGHT ANTECUBITAL  Final   Special Requests BOTTLES DRAWN AEROBIC AND ANAEROBIC 5CC  Final   Culture  Setup Time   Final    GRAM NEGATIVE RODS IN BOTH AEROBIC AND ANAEROBIC BOTTLES CRITICAL VALUE NOTED.  VALUE IS CONSISTENT WITH PREVIOUSLY REPORTED AND CALLED VALUE.    Culture PENDING  Incomplete   Report Status PENDING  Incomplete  Culture, blood (Routine x 2)     Status: None (Preliminary result)   Collection Time: 08/03/16  5:30 PM  Result Value Ref Range Status   Specimen Description BLOOD LEFT WRIST  Final   Special Requests BOTTLES DRAWN AEROBIC AND ANAEROBIC 5CC  Final   Culture  Setup Time   Final    GRAM NEGATIVE RODS IN BOTH AEROBIC AND ANAEROBIC BOTTLES Organism ID to follow    Culture PENDING  Incomplete   Report Status PENDING  Incomplete  MRSA PCR Screening     Status: None   Collection Time: 08/04/16 12:30 AM  Result Value Ref Range Status   MRSA by PCR NEGATIVE NEGATIVE Final    Comment:        The GeneXpert MRSA Assay (FDA approved for NASAL specimens only), is one component  of a comprehensive MRSA colonization surveillance program. It is not intended to diagnose MRSA infection nor to guide or monitor treatment for MRSA infections.     Studies/Results: Ct Abdomen Pelvis Wo Contrast  Result Date: 08/03/2016 CLINICAL DATA:  Sepsis with nausea, vomiting and diarrhea times 1-2 days. EXAM: CT ABDOMEN AND PELVIS WITHOUT CONTRAST TECHNIQUE: Multidetector CT imaging of the abdomen and pelvis was performed following the standard protocol without IV contrast. COMPARISON:  CXR from the same day FINDINGS: Lower chest: The visualized heart is within normal limits for size. There is no  pericardial effusion. There is coronary arteriosclerosis as well as calcifications at the level of the aortic root. Trace bilateral pleural effusions. There is mild interstitial edema. There is bibasilar atelectasis with more confluent patchy airspace opacities in both lower lobes. Pneumonia is not excluded. Hepatobiliary: The unenhanced liver is unremarkable. The gallbladder is distended without calculi or wall thickening. No biliary dilatation. Pancreas: Unremarkable appearance of the pancreas. Spleen: No splenomegaly. Adrenals/Urinary Tract: There is a 4 mm left proximal ureteral stone causing mild to moderate left-sided hydronephrosis. There is an 8 x 9 mm nonobstructing right renal pelvic stone with extrarenal pelvis. Bilateral nephrolithiasis is noted. There are at least 3-4 punctate calcifications within the bladder which likely reflect previously passed stones. Bilateral perinephric fat stranding is noted. Cannot exclude urinary tract infection and pyelonephritis given lack of IV contrast. Stomach/Bowel: Normal bowel rotation. No small bowel dilatation. Moderate stool burden in the rectosigmoid. There is mild transmural thickening of the sigmoid and colitis is not entirely exclude. Vascular/Lymphatic: The abdominal aorta is tortuous and partially calcified without aneurysm. Atherosclerosis of the left common iliac artery is also seen extending into the internal iliac. No pathologically enlarged lymph nodes. Reproductive: The uterus and adnexa are unremarkable. Other: Mild mesenteric fatty edema. Musculoskeletal: There is dextroconvex curvature of the lumbar spine with spondylosis. No acute osseous abnormality nor suspicious osseous abnormalities. IMPRESSION: 1. Bilateral nephrolithiasis with 4 mm left proximal ureteral stone causing mild-to-moderate hydroureteronephrosis. There is an 8 x 9 mm nonobstructing right renal pelvic calculus within extrarenal pelvis. Bilateral perinephric fat stranding is seen.  Cannot exclude urinary tract infection or pyelonephritis with these findings and in the absence of IV contrast. 2. Bilateral nephrolithiasis and tiny bladder calculi. 3. There is bibasilar patchy atelectasis with trace bilateral pleural effusions. More confluent areas of airspace opacity in both lower lobes cannot exclude pneumonia. 4. Mild transmural thickening of sigmoid colon. Findings are suspicious for colitis. Electronically Signed   By: Ashley Royalty M.D.   On: 08/03/2016 20:05   Ct Head Wo Contrast  Result Date: 08/03/2016 CLINICAL DATA:  70 year old female with confusion and altered mental status. Suspected sepsis. Initial encounter. EXAM: CT HEAD WITHOUT CONTRAST TECHNIQUE: Contiguous axial images were obtained from the base of the skull through the vertex without intravenous contrast. COMPARISON:  None. FINDINGS: Brain: Chronic encephalomalacia in the left frontal lobe along the anterior left frontal operculum. Mild ex vacuo enlargement of the adjacent left lateral ventricle. Mild superimposed other white matter hypodensity, mostly along the frontal horns. No other cortical encephalomalacia identified. No midline shift, ventriculomegaly, mass effect, evidence of mass lesion, intracranial hemorrhage or evidence of cortically based acute infarction. Vascular: No suspicious intracranial vascular hyperdensity. Skull: No acute osseous abnormality identified. Sinuses/Orbits: Mild mucosal thickening right maxillary sinus. Trace fluid level right sphenoid sinus. Other Visualized paranasal sinuses and mastoids are well pneumatized. Other: Negative orbit and scalp soft tissues. IMPRESSION: 1.  No acute intracranial abnormality. 2. Chronic  anterior division left MCA infarct. 3. Minimal right maxillary and sphenoid sinus inflammation, significance doubtful. Electronically Signed   By: Genevie Ann M.D.   On: 08/03/2016 18:34   Dg Chest Port 1 View  Result Date: 08/04/2016 CLINICAL DATA:  Central line placement EXAM:  PORTABLE CHEST 1 VIEW COMPARISON:  08/04/2016 at 00:17 FINDINGS: Right jugular central line has been placed. Tip is in the expected location of the SVC just below the azygos vein junction. Endotracheal tube tip is 3.6 cm above the carina. Nasogastric tube extends into the stomach and beyond the inferior edge of the image. Dense consolidation persists in the left base with milder curvilinear opacity in the right base, unchanged. No pneumothorax. IMPRESSION: 1. New right jugular central line.  No pneumothorax. 2. ET and NG tubes appear satisfactorily positioned. 3. No significant interval change in the bilateral airspace opacities. Electronically Signed   By: Andreas Newport M.D.   On: 08/04/2016 01:59   Dg Chest Port 1 View  Result Date: 08/04/2016 CLINICAL DATA:  Encephalopathy.  Intubated. EXAM: PORTABLE CHEST 1 VIEW COMPARISON:  08/03/2016 FINDINGS: Endotracheal tube is 4.6 cm above the carina. Nasogastric tube extends into the stomach and beyond the inferior edge of the image. Airspace opacities are present in both bases, worsened on the left were there now was more confluent consolidation. Probable small left pleural effusion. No pneumothorax. IMPRESSION: 1.  Support equipment appears satisfactorily positioned. 2. Worsening airspace opacity in the left base. Electronically Signed   By: Andreas Newport M.D.   On: 08/04/2016 00:59   Dg Chest Portable 1 View  Result Date: 08/03/2016 CLINICAL DATA:  Altered mental status EXAM: PORTABLE CHEST 1 VIEW COMPARISON:  None. FINDINGS: Low volume chest with streaky basilar opacities. No edema, effusion, or pneumothorax. Normal heart size and mediastinal contours when accounting for leftward rotation. IMPRESSION: Low volume chest with basilar atelectasis. Electronically Signed   By: Monte Fantasia M.D.   On: 08/03/2016 17:25      Vennela Jutte C 08/04/2016, 8:25 AM

## 2016-08-04 NOTE — Procedures (Signed)
Central Venous Catheter Insertion Procedure Note Kimila Synnott Kazlauskas [id number] [birth year]  Procedure: Insertion of Central Venous Catheter Indications: Assessment of intravascular volume, Drug and/or fluid administration and Frequent blood sampling  Procedure Details Consent: Risks of procedure as well as the alternatives and risks of each were explained to the (patient/caregiver).  Consent for procedure obtained. Time Out: Verified patient identification, verified procedure, site/side was marked, verified correct patient position, special equipment/implants available, medications/allergies/relevent history reviewed, required imaging and test results available.  Performed  Maximum sterile technique was used including antiseptics, cap, gloves, gown, hand hygiene, mask and sheet. Skin prep: Chlorhexidine; local anesthetic administered A antimicrobial bonded/coated triple lumen catheter was placed in the right internal jugular vein using the Seldinger technique. Ultrasound guidance used.Yes.   Catheter placed to 16 cm. Blood aspirated via all 3 ports and then flushed x 3. Line sutured x 2 and dressing applied.  Evaluation Blood flow good Complications: No apparent complications Patient did tolerate procedure well. Chest X-ray ordered to verify placement.  CXR: pending.  Georgann Housekeeper, AGACNP-BC [facility] Pulmonology/Critical Care Pager [phone] or [phone]  08/04/2016 1:24 AM

## 2016-08-04 NOTE — Care Management Note (Signed)
Case Management Note  Patient Details  Name: Regina Richards MRN: [id number] Date of Birth: [birth year]  Subjective/Objective:      Sepsis early pna, with ams requiring intubation              Action/Plan: from home Date:  August 04, 2016 Chart reviewed for concurrent status and case management needs. Will continue to follow patient progress. Discharge Planning: following for needs Expected discharge date: [id number] Velva Harman, BSN, [place], [place]  Expected Discharge Date:   (unknown)               Expected Discharge Plan:  Home/Self Care  In-House Referral:     Discharge planning Services     Post Acute Care Choice:    Choice offered to:     DME Arranged:    DME Agency:     HH Arranged:    HH Agency:     Status of Service:  In process, will continue to follow  If discussed at Long Length of Stay Meetings, dates discussed:    Additional Comments:  Leeroy Cha, RN 08/04/2016, 10:50 AM

## 2016-08-04 NOTE — Progress Notes (Addendum)
   [facility] PCCM  Pt extubated this am and she looks comfortable, NAD. VSS.  Some on and off confusion. Neuro exam unremarkable.  Pt drinks 3 beers nightly, last drink was Monday.  Will observe for withdrawal. Will check ETOH level. Will order CIWA protocol.   Case discussed with [facility] Dr. Tana Coast.  I wanted [facility] to pick up pt in am. She wanted [facility] on 12/9 to discuss case with her re:  transfer.   Monica Becton, MD 08/04/2016, 1:39 PM  Pulmonary and Critical Care Pager [phone] After 3 pm or if no answer, call [phone]

## 2016-08-04 NOTE — Anesthesia Postprocedure Evaluation (Signed)
Anesthesia Post Note  Patient: Maheen Dedios Giovanetti  Procedure(s) Performed: Procedure(s) (LRB): CYSTOSCOPY WITH BILATERAL STENT REPLACEMENT, BILATERAL RETROGRADE PYELOGRAM (Bilateral)  Patient location during evaluation: SICU Anesthesia Type: General Level of consciousness: patient remains intubated per anesthesia plan and sedated Pain management: pain level controlled Vital Signs Assessment: post-procedure vital signs reviewed and stable Respiratory status: patient remains intubated per anesthesia plan Cardiovascular status: tachycardic and unstable Postop Assessment: no signs of nausea or vomiting Anesthetic complications: no Comments: Patient septic and BP in 80s to SICU    Last Vitals:  Vitals:   08/03/16 2200 08/04/16 0009  BP: 120/73   Pulse: 120   Resp: 23   Temp:  (!) 35.6 C    Last Pain:  Vitals:   08/04/16 0009  TempSrc: Axillary  PainSc:                  Gauri Galvao,JAMES TERRILL

## 2016-08-05 ENCOUNTER — Inpatient Hospital Stay (HOSPITAL_COMMUNITY): Payer: Medicare Other

## 2016-08-05 LAB — BASIC METABOLIC PANEL WITH GFR
Anion gap: 7 (ref 5–15)
BUN: 56 mg/dL — ABNORMAL HIGH (ref 6–20)
CO2: 24 mmol/L (ref 22–32)
Calcium: 7.2 mg/dL — ABNORMAL LOW (ref 8.9–10.3)
Chloride: 113 mmol/L — ABNORMAL HIGH (ref 101–111)
Creatinine, Ser: 2.26 mg/dL — ABNORMAL HIGH (ref 0.44–1.00)
GFR calc Af Amer: 24 mL/min — ABNORMAL LOW
GFR calc non Af Amer: 21 mL/min — ABNORMAL LOW
Glucose, Bld: 96 mg/dL (ref 65–99)
Potassium: 3.9 mmol/L (ref 3.5–5.1)
Sodium: 144 mmol/L (ref 135–145)

## 2016-08-05 LAB — CBC
HCT: 28.9 % — ABNORMAL LOW (ref 36.0–46.0)
Hemoglobin: 9 g/dL — ABNORMAL LOW (ref 12.0–15.0)
MCH: 24.8 pg — ABNORMAL LOW (ref 26.0–34.0)
MCHC: 31.1 g/dL (ref 30.0–36.0)
MCV: 79.6 fL (ref 78.0–100.0)
Platelets: 32 K/uL — ABNORMAL LOW (ref 150–400)
RBC: 3.63 MIL/uL — ABNORMAL LOW (ref 3.87–5.11)
RDW: 17.5 % — ABNORMAL HIGH (ref 11.5–15.5)
WBC: 13.1 K/uL — ABNORMAL HIGH (ref 4.0–10.5)

## 2016-08-05 LAB — URINE CULTURE

## 2016-08-05 LAB — PROTIME-INR
INR: 1.2
Prothrombin Time: 15.3 s — ABNORMAL HIGH (ref 11.4–15.2)

## 2016-08-05 LAB — MAGNESIUM: MAGNESIUM: 2.6 mg/dL — AB (ref 1.7–2.4)

## 2016-08-05 LAB — PROCALCITONIN: Procalcitonin: 78.75 ng/mL

## 2016-08-05 LAB — APTT: aPTT: 28 s (ref 24–36)

## 2016-08-05 LAB — PHOSPHORUS: Phosphorus: 3.4 mg/dL (ref 2.5–4.6)

## 2016-08-05 MED ORDER — SODIUM CHLORIDE 0.9 % IV BOLUS (SEPSIS)
1000.0000 mL | Freq: Once | INTRAVENOUS | Status: AC
  Administered 2016-08-05: 1000 mL via INTRAVENOUS

## 2016-08-05 MED ORDER — SODIUM CHLORIDE 0.9 % IV SOLN
INTRAVENOUS | Status: DC
  Administered 2016-08-05 – 2016-08-06 (×2): via INTRAVENOUS

## 2016-08-05 MED ORDER — PAROXETINE HCL 20 MG PO TABS
40.0000 mg | ORAL_TABLET | Freq: Every day | ORAL | Status: DC
  Administered 2016-08-05 – 2016-08-09 (×5): 40 mg via ORAL
  Filled 2016-08-05 (×5): qty 2

## 2016-08-05 MED ORDER — HYDRALAZINE HCL 20 MG/ML IJ SOLN
10.0000 mg | INTRAMUSCULAR | Status: DC | PRN
  Administered 2016-08-07 – 2016-08-08 (×2): 10 mg via INTRAVENOUS
  Filled 2016-08-05 (×4): qty 1

## 2016-08-05 MED ORDER — CHLORHEXIDINE GLUCONATE 0.12 % MT SOLN
OROMUCOSAL | Status: AC
  Administered 2016-08-05: 15 mL via OROMUCOSAL
  Filled 2016-08-05: qty 15

## 2016-08-05 MED ORDER — DEXMEDETOMIDINE HCL IN NACL 200 MCG/50ML IV SOLN
0.2000 ug/kg/h | INTRAVENOUS | Status: DC
  Administered 2016-08-05: 0.2 ug/kg/h via INTRAVENOUS
  Administered 2016-08-06: 0.3 ug/kg/h via INTRAVENOUS
  Administered 2016-08-06: 0.2 ug/kg/h via INTRAVENOUS
  Filled 2016-08-05 (×3): qty 50

## 2016-08-05 NOTE — Progress Notes (Signed)
CRITICAL VALUE ALERT  Critical value received:  Platelet 29  Date of notification:  08/05/2016  Time of notification:  [id number]  Critical value read back:Yes.    Nurse who received alert:  Jeannie Fend RN  MD notified (1st page):  Dr. Katheran James  Time of first page:  [phone]  MD notified (2nd page):  Time of second page:  Responding MD:  Dr. Sabino Donovan  Time MD responded:  [phone]

## 2016-08-05 NOTE — Progress Notes (Signed)
Pt increasingly agitated, hypertensive, and had a small bloody BM.  Nothing PRN to administer.  Called [facility] and left message for Dr. Sabino Donovan.  Irven Baltimore, RN

## 2016-08-05 NOTE — Progress Notes (Signed)
[facility] Progress Note Patient Name: KRISSY MISQUEZ DOB: [birth year] MRN: [id number]   Date of Service  08/05/2016  HPI/Events of Note  Platelet count = 32 >> 29.   eICU Interventions  Will transfuse platelets if BRBPR persists.      Intervention Category Intermediate Interventions: Diagnostic test evaluation  Sommer,Steven Eugene 08/05/2016, 7:02 PM

## 2016-08-05 NOTE — Progress Notes (Signed)
Critical lab value platelets 29 received from bedside RN Shanon Brow to this [place].  Message relayed to Dr Quentin Ore MD.

## 2016-08-05 NOTE — Progress Notes (Signed)
PULMONARY / CRITICAL CARE MEDICINE   Name: Regina Richards MRN: [id number] DOB: [birth year]    ADMISSION DATE:  08/03/2016 CONSULTATION DATE:  08/03/2016  REFERRING MD:  Sepsis secondary to obstructing nephrolithiasis.  CHIEF COMPLAINT:  AMS   BRIEF:  Patient is encephalopathic and/or intubated. Therefore history has been obtained from chart review. 70 year old female with unknown medical history at this point. She apparently went to concert [id number]. Since that time she has had progressive nausea/vomiting and diarrhea. 12/7 her sisters attempted to contact her, which they had not been able to do for about 24 hours and went to her house where she was very confused. They brought her to ED where she was unable to provide any history. She was found to be febrile and tachycardic, bur normotensive. Lactic elevated at above 6 and she was given 81mL/Kg fluid bolus. She underwent CT to evaluate for septic source. CT abdomen showed 52mm L ureteral stone causing hydronephrosis. She was started on broad spectrum ABX and transferred to [facility] for urologic evaluation. She urgently underwent cytoscopy with bilateral double-J stent placement. Post-operatively she was left on ventilator and sent to ICU for recovery.    SIGNIFICANT EVENTS: 12/7 admit, to OR for bilateral ureteral stent placement. ETT 12/8 > 12/8   SUBJECTIVE/OVERNIGHT/INTERVAL HX 12/9 - fever resolved,  wbc down,  Remains off vent. But still very confused - hypoactie delirium. Did not recognize sister Dahlia Client at bedside. Did not know year. Not oritented to place. Sister gives hx of 3 beers/day and anxiety on PAXIL.Lives alone Only family is siblings.  > AKi Improving. Did   VITAL SIGNS: BP (!) 153/83   Pulse 94   Temp 99 F (37.2 C) (Axillary)   Resp 16   Ht 5\' 2"  (1.575 m)   Wt 78.4 kg (172 lb 13.5 oz)   LMP  (LMP Unknown) Comment: Pt is intubated and unable to tell me her LMP  SpO2 93%   BMI 31.61 kg/m   HEMODYNAMICS:    VENTILATOR  SETTINGS:    INTAKE / OUTPUT: I/O last 3 completed shifts: In: 6224.8 [I.V.:3409.8; IV [id number] Out: 2150 [Urine:2150]  PHYSICAL EXAMINATION: General:  Obese female in NAD Neuro: awake calm, follows confused. But not oriented HEENT:  [place]/AT, PERRL, no JVD. MOUTH LOOKS DRY Cardiovascular:  Tachy, regular, no MRG Lungs:  Clear Abdomen:  Soft, distended Musculoskeletal:  No acute deformity Skin:  Grossly intact  LABS:  PULMONARY  Recent Labs Lab 08/04/16 0030 08/04/16 0430  PHART 7.225*  --   PCO2ART 48.4*  --   PO2ART 170*  --   HCO3 19.6* 19.7*  O2SAT 98.5 77.8    CBC  Recent Labs Lab 08/03/16 1655 08/04/16 0201 08/05/16 0555  HGB 10.8* 10.0* 9.0*  HCT 33.8* 31.9* 28.9*  WBC 5.9 15.5* 13.1*  PLT 41* 31* 32*    COAGULATION  Recent Labs Lab 08/03/16 1655  INR 1.64    CARDIAC  No results for input(s): TROPONINI in the last 168 hours. No results for input(s): PROBNP in the last 168 hours.   CHEMISTRY  Recent Labs Lab 08/03/16 1655 08/04/16 0201 08/04/16 1204 08/05/16 0555  NA 134* 140 140 144  K 3.1* 3.5 4.2 3.9  CL 100* 108 109 113*  CO2 17* 21* 23 24  GLUCOSE 127* 87 102* 96  BUN 55* 54* 55* 56*  CREATININE 3.89* 3.06* 2.54* 2.26*  CALCIUM 7.7* 7.1* 7.3* 7.2*  MG  --  1.6* 3.0*  --   PHOS  --  5.5* 4.9*  --    Estimated Creatinine Clearance: 22.8 mL/min (by C-G formula based on SCr of 2.26 mg/dL (H)).   LIVER  Recent Labs Lab 08/03/16 1655 08/04/16 0201  AST 112* 96*  ALT 70* 63*  ALKPHOS 188* 92  BILITOT 0.8 0.9  PROT 6.1* 5.6*  ALBUMIN 2.6* 2.6*  INR 1.64  --      INFECTIOUS  Recent Labs Lab 08/03/16 2134 08/04/16 0201 08/04/16 1203  LATICACIDVEN 3.26* 3.8* 1.5     ENDOCRINE CBG (last 3)   Recent Labs  08/03/16 1703  GLUCAP 112*         IMAGING x48h  - image(s) personally visualized  -   highlighted in bold Ct Abdomen Pelvis Wo Contrast  Result Date: 08/03/2016 CLINICAL DATA:  Sepsis with  nausea, vomiting and diarrhea times 1-2 days. EXAM: CT ABDOMEN AND PELVIS WITHOUT CONTRAST TECHNIQUE: Multidetector CT imaging of the abdomen and pelvis was performed following the standard protocol without IV contrast. COMPARISON:  CXR from the same day FINDINGS: Lower chest: The visualized heart is within normal limits for size. There is no pericardial effusion. There is coronary arteriosclerosis as well as calcifications at the level of the aortic root. Trace bilateral pleural effusions. There is mild interstitial edema. There is bibasilar atelectasis with more confluent patchy airspace opacities in both lower lobes. Pneumonia is not excluded. Hepatobiliary: The unenhanced liver is unremarkable. The gallbladder is distended without calculi or wall thickening. No biliary dilatation. Pancreas: Unremarkable appearance of the pancreas. Spleen: No splenomegaly. Adrenals/Urinary Tract: There is a 4 mm left proximal ureteral stone causing mild to moderate left-sided hydronephrosis. There is an 8 x 9 mm nonobstructing right renal pelvic stone with extrarenal pelvis. Bilateral nephrolithiasis is noted. There are at least 3-4 punctate calcifications within the bladder which likely reflect previously passed stones. Bilateral perinephric fat stranding is noted. Cannot exclude urinary tract infection and pyelonephritis given lack of IV contrast. Stomach/Bowel: Normal bowel rotation. No small bowel dilatation. Moderate stool burden in the rectosigmoid. There is mild transmural thickening of the sigmoid and colitis is not entirely exclude. Vascular/Lymphatic: The abdominal aorta is tortuous and partially calcified without aneurysm. Atherosclerosis of the left common iliac artery is also seen extending into the internal iliac. No pathologically enlarged lymph nodes. Reproductive: The uterus and adnexa are unremarkable. Other: Mild mesenteric fatty edema. Musculoskeletal: There is dextroconvex curvature of the lumbar spine with  spondylosis. No acute osseous abnormality nor suspicious osseous abnormalities. IMPRESSION: 1. Bilateral nephrolithiasis with 4 mm left proximal ureteral stone causing mild-to-moderate hydroureteronephrosis. There is an 8 x 9 mm nonobstructing right renal pelvic calculus within extrarenal pelvis. Bilateral perinephric fat stranding is seen. Cannot exclude urinary tract infection or pyelonephritis with these findings and in the absence of IV contrast. 2. Bilateral nephrolithiasis and tiny bladder calculi. 3. There is bibasilar patchy atelectasis with trace bilateral pleural effusions. More confluent areas of airspace opacity in both lower lobes cannot exclude pneumonia. 4. Mild transmural thickening of sigmoid colon. Findings are suspicious for colitis. Electronically Signed   By: Ashley Royalty M.D.   On: 08/03/2016 20:05   Ct Head Wo Contrast  Result Date: 08/03/2016 CLINICAL DATA:  70 year old female with confusion and altered mental status. Suspected sepsis. Initial encounter. EXAM: CT HEAD WITHOUT CONTRAST TECHNIQUE: Contiguous axial images were obtained from the base of the skull through the vertex without intravenous contrast. COMPARISON:  None. FINDINGS: Brain: Chronic encephalomalacia in the left frontal lobe along the anterior left frontal operculum. Mild  ex vacuo enlargement of the adjacent left lateral ventricle. Mild superimposed other white matter hypodensity, mostly along the frontal horns. No other cortical encephalomalacia identified. No midline shift, ventriculomegaly, mass effect, evidence of mass lesion, intracranial hemorrhage or evidence of cortically based acute infarction. Vascular: No suspicious intracranial vascular hyperdensity. Skull: No acute osseous abnormality identified. Sinuses/Orbits: Mild mucosal thickening right maxillary sinus. Trace fluid level right sphenoid sinus. Other Visualized paranasal sinuses and mastoids are well pneumatized. Other: Negative orbit and scalp soft  tissues. IMPRESSION: 1.  No acute intracranial abnormality. 2. Chronic anterior division left MCA infarct. 3. Minimal right maxillary and sphenoid sinus inflammation, significance doubtful. Electronically Signed   By: Genevie Ann M.D.   On: 08/03/2016 18:34   Dg Chest Port 1 View  Result Date: 08/05/2016 CLINICAL DATA:  Respiratory failure EXAM: PORTABLE CHEST 1 VIEW COMPARISON:  08/04/2016 FINDINGS: Endotracheal tube is been removed. Central venous catheter tip remains in the SVC. NG removed. Hypoventilation with bibasilar atelectasis unchanged from the prior study. Decrease in vascular congestion since prior study. IMPRESSION: Hypoventilation with bibasilar atelectasis unchanged. Decrease in vascular congestion. Endotracheal tube and NG tube have been removed. Central venous catheter tip remains in the SVC. Electronically Signed   By: Franchot Gallo M.D.   On: 08/05/2016 06:58   Dg Chest Port 1 View  Result Date: 08/04/2016 CLINICAL DATA:  Central line placement EXAM: PORTABLE CHEST 1 VIEW COMPARISON:  08/04/2016 at 00:17 FINDINGS: Right jugular central line has been placed. Tip is in the expected location of the SVC just below the azygos vein junction. Endotracheal tube tip is 3.6 cm above the carina. Nasogastric tube extends into the stomach and beyond the inferior edge of the image. Dense consolidation persists in the left base with milder curvilinear opacity in the right base, unchanged. No pneumothorax. IMPRESSION: 1. New right jugular central line.  No pneumothorax. 2. ET and NG tubes appear satisfactorily positioned. 3. No significant interval change in the bilateral airspace opacities. Electronically Signed   By: Andreas Newport M.D.   On: 08/04/2016 01:59   Dg Chest Port 1 View  Result Date: 08/04/2016 CLINICAL DATA:  Encephalopathy.  Intubated. EXAM: PORTABLE CHEST 1 VIEW COMPARISON:  08/03/2016 FINDINGS: Endotracheal tube is 4.6 cm above the carina. Nasogastric tube extends into the stomach  and beyond the inferior edge of the image. Airspace opacities are present in both bases, worsened on the left were there now was more confluent consolidation. Probable small left pleural effusion. No pneumothorax. IMPRESSION: 1.  Support equipment appears satisfactorily positioned. 2. Worsening airspace opacity in the left base. Electronically Signed   By: Andreas Newport M.D.   On: 08/04/2016 00:59   Dg Chest Portable 1 View  Result Date: 08/03/2016 CLINICAL DATA:  Altered mental status EXAM: PORTABLE CHEST 1 VIEW COMPARISON:  None. FINDINGS: Low volume chest with streaky basilar opacities. No edema, effusion, or pneumothorax. Normal heart size and mediastinal contours when accounting for leftward rotation. IMPRESSION: Low volume chest with basilar atelectasis. Electronically Signed   By: Monte Fantasia M.D.   On: 08/03/2016 17:25   Dg Abd Portable 1v  Result Date: 08/04/2016 CLINICAL DATA:  Abdominal distension. EXAM: PORTABLE ABDOMEN - 1 VIEW COMPARISON:  CT scan of August 03, 2016. FINDINGS: The bowel gas pattern is normal. Distal tip of nasogastric tube is seen in expected position of distal stomach. Bilateral ureteral stents are noted. Right renal calculi are noted as described on prior CT scan. IMPRESSION: Right nephrolithiasis. Bilateral ureteral stents are noted.  No evidence of bowel obstruction or ileus. Electronically Signed   By: Marijo Conception, M.D.   On: 08/04/2016 08:35        STUDIES:  CT abd 12/7 > Bilateral nephrolithiasis with 4 mm left proximal ureteral stone causing mild-to-moderate hydroureteronephrosis. There is an 8 x 9 mm nonobstructing right renal pelvic calculus within extrarenal pelvis. There is bibasilar patchy atelectasis with trace bilateral pleural effusions.  CT head 12/7 > No acute intracranial abnormality. Chronic anterior division left MCA infarct. Minimal right maxillary and sphenoid sinus inflammation, significance doubtful.  CULTURES: Blood cultures  12/7 > Urine 12/7 >  ANTIBIOTICS: Zosyn 12/7 >> Vanco x1 12/7 DISCUSSION: Awake and alert.  ASSESSMENT / PLAN:  PULMONARY A: S/p extuation 12/8 and doing well 08/05/16  P:   pulm toilet   CARDIOVASCULAR A:  Septic shock, likely a component of hypovolemia here as well   - resolved as of 12/9 and off pressors but still looks dry clinically  P:  Telemetry Map goal > 65mmHg 2L fluid bolus and then 100cc/h   RENAL Lab Results  Component Value Date   CREATININE 2.26 (H) 08/05/2016   CREATININE 2.54 (H) 08/04/2016   CREATININE 3.06 (H) 08/04/2016    Recent Labs Lab 08/04/16 0201 08/04/16 1204 08/05/16 0555  K 3.5 4.2 3.9     A:   AKI likely post-renal in setting of below L hydronephrosis Bilateral nephrolithiasis Hypokalemia Post bilateral stent placement   - improving 12/9; looks dry  P:   Hydrate (2l bolus and then 100cc/h) Follow BMP Check mag / phos  GASTROINTESTINAL A:   Nausea/vomiting Diarrhea  P:   NPO for now Monitor  HEMATOLOGIC   Recent Labs Lab 08/03/16 1655 08/04/16 0201 08/05/16 0555  HGB 10.8* 10.0* 9.0*  HCT 33.8* 31.9* 28.9*  WBC 5.9 15.5* 13.1*  PLT 41* 31* 32*     A:   Anemia, unclear baseline Thrombocytopenia  P:  SCDs only for now Trend CBC  INFECTIOUS A:   Septic shock secondary to pyelo/hydronephrosis  P:   Check PCT Anti-infectives    Start     Dose/Rate Route Frequency Ordered Stop   08/05/16 1700  vancomycin (VANCOCIN) IVPB 1000 mg/200 mL premix  Status:  Discontinued     1,000 mg 200 mL/hr over 60 Minutes Intravenous Every 48 hours 08/03/16 1947 08/04/16 0035   08/04/16 0000  piperacillin-tazobactam (ZOSYN) IVPB 3.375 g     3.375 g 12.5 mL/hr over 240 Minutes Intravenous Every 8 hours 08/03/16 1947     08/03/16 1730  piperacillin-tazobactam (ZOSYN) IVPB 3.375 g     3.375 g 100 mL/hr over 30 Minutes Intravenous  Once 08/03/16 1729 08/03/16 1815   08/03/16 1730  vancomycin (VANCOCIN) IVPB  1000 mg/200 mL premix     1,000 mg 200 mL/hr over 60 Minutes Intravenous  Once 08/03/16 1729 08/03/16 2118       ENDOCRINE A:   No acute issues  P:    NEUROLOGIC A:   #baseline  - Anxiety on paxil - Baseline ETOH daily  #Currently  Hypoactgive delirum - signficant due to AKI, dehydration, sepsis and chronic issues. Non focal  P:   Restart  home paroxetine Aggressive IV fluids If agiated do precedex  Try to avoid benzo   FAMILY  - Updates: sister Dahlia Client and her husband  updated bedside in ICU 08/05/2016   - Inter-disciplinary family meet or Palliative Care meeting due by:  12/14   GLOBAL 12/9- move to SDU  08/05/2016 and then [facility] primary with PCCM off from 12/10. Awaiting page from Dr Tana Coast    The patient is critically ill with multiple organ systems failure and requires high complexity decision making for assessment and support, frequent evaluation and titration of therapies, application of advanced monitoring technologies and extensive interpretation of multiple databases.   Critical Care Time devoted to patient care services described in this note is  30  Minutes. This time reflects time of care of this signee Dr Brand Males. This critical care time does not reflect procedure time, or teaching time or supervisory time of PA/NP/Med student/Med Resident etc but could involve care discussion time    Dr. Brand Males, M.D., [facility].C.P Pulmonary and Critical Care Medicine Staff Physician [place] Pulmonary and Critical Care Pager: [phone], If no answer or between  15:00h - 7:00h: call [phone]  08/05/2016 10:28 AM

## 2016-08-05 NOTE — Progress Notes (Signed)
eLink Physician-Brief Progress Note Patient Name: Regina Richards DOB: 1945/10/16 MRN: [id number]   Date of Service  08/05/2016  HPI/Events of Note  Multiple issues: 1. Agitation, 2. Hypertension - BP = 175/85 and BRBPR X 2. Last Hgb = 9.0. Not on Lovenox or Heparin.  eICU Interventions  Will order: 1. Low dose Precedex IV infusion. Titrate to RASS = 0. 2. Hydralazine 10 mg IV Q 4 hours PRN SBP > 160 or DBP > 100. 3. CBC w/platelets. PT/INR and PTT now.      Intervention Category Intermediate Interventions: Hypertension - evaluation and management;Other: Minor Interventions: Agitation / anxiety - evaluation and management  Sommer,Steven Eugene 08/05/2016, 5:08 PM

## 2016-08-06 LAB — TROPONIN I
TROPONIN I: 0.05 ng/mL — AB (ref <0.03)
TROPONIN I: 0.04 ng/mL — AB (ref <0.03)
TROPONIN I: 0.04 ng/mL — AB (ref <0.03)
Troponin I: 0.04 ng/mL (ref <0.03)

## 2016-08-06 LAB — BASIC METABOLIC PANEL
ANION GAP: 6 (ref 5–15)
BUN: 51 mg/dL — AB (ref 6–20)
CALCIUM: 7.7 mg/dL — AB (ref 8.9–10.3)
CO2: 23 mmol/L (ref 22–32)
CREATININE: 1.95 mg/dL — AB (ref 0.44–1.00)
Chloride: 120 mmol/L — ABNORMAL HIGH (ref 101–111)
GFR calc Af Amer: 29 mL/min — ABNORMAL LOW (ref >60)
GFR, EST NON AFRICAN AMERICAN: 25 mL/min — AB (ref >60)
GLUCOSE: 110 mg/dL — AB (ref 65–99)
Potassium: 3.6 mmol/L (ref 3.5–5.1)
Sodium: 149 mmol/L — ABNORMAL HIGH (ref 135–145)

## 2016-08-06 LAB — CBC WITH DIFFERENTIAL/PLATELET
BASOS ABS: 0 10*3/uL (ref 0.0–0.1)
BASOS ABS: 0 10*3/uL (ref 0.0–0.1)
BASOS PCT: 0 %
Basophils Relative: 0 %
EOS ABS: 0 10*3/uL (ref 0.0–0.7)
EOS ABS: 0 10*3/uL (ref 0.0–0.7)
EOS PCT: 0 %
Eosinophils Relative: 0 %
HCT: 29.4 % — ABNORMAL LOW (ref 36.0–46.0)
HCT: 28.4 % — ABNORMAL LOW (ref 36.0–46.0)
HEMOGLOBIN: 9.2 g/dL — AB (ref 12.0–15.0)
Hemoglobin: 8.8 g/dL — ABNORMAL LOW (ref 12.0–15.0)
Lymphocytes Relative: 5 %
Lymphocytes Relative: 6 %
Lymphs Abs: 0.8 10*3/uL (ref 0.7–4.0)
Lymphs Abs: 0.7 10*3/uL (ref 0.7–4.0)
MCH: 24.8 pg — ABNORMAL LOW (ref 26.0–34.0)
MCH: 24.7 pg — ABNORMAL LOW (ref 26.0–34.0)
MCHC: 31.3 g/dL (ref 30.0–36.0)
MCHC: 31 g/dL (ref 30.0–36.0)
MCV: 79.2 fL (ref 78.0–100.0)
MCV: 79.8 fL (ref 78.0–100.0)
MONO ABS: 1.4 10*3/uL — AB (ref 0.1–1.0)
Monocytes Absolute: 1.2 10*3/uL — ABNORMAL HIGH (ref 0.1–1.0)
Monocytes Relative: 9 %
Monocytes Relative: 12 %
NEUTROS PCT: 86 %
Neutro Abs: 12 10*3/uL — ABNORMAL HIGH (ref 1.7–7.7)
Neutro Abs: 9.2 10*3/uL — ABNORMAL HIGH (ref 1.7–7.7)
Neutrophils Relative %: 82 %
PLATELETS: 36 10*3/uL — AB (ref 150–400)
Platelets: 29 10*3/uL — CL (ref 150–400)
RBC: 3.71 MIL/uL — AB (ref 3.87–5.11)
RBC: 3.56 MIL/uL — AB (ref 3.87–5.11)
RDW: 17.4 % — ABNORMAL HIGH (ref 11.5–15.5)
RDW: 17.7 % — AB (ref 11.5–15.5)
WBC: 14 10*3/uL — AB (ref 4.0–10.5)
WBC: 11.2 10*3/uL — AB (ref 4.0–10.5)

## 2016-08-06 LAB — HEPATIC FUNCTION PANEL
ALT: 57 U/L — ABNORMAL HIGH (ref 14–54)
AST: 60 U/L — ABNORMAL HIGH (ref 15–41)
Albumin: 2.1 g/dL — ABNORMAL LOW (ref 3.5–5.0)
Alkaline Phosphatase: 127 U/L — ABNORMAL HIGH (ref 38–126)
BILIRUBIN DIRECT: 0.3 mg/dL (ref 0.1–0.5)
BILIRUBIN INDIRECT: 0.8 mg/dL (ref 0.3–0.9)
BILIRUBIN TOTAL: 1.1 mg/dL (ref 0.3–1.2)
Total Protein: 5.5 g/dL — ABNORMAL LOW (ref 6.5–8.1)

## 2016-08-06 LAB — MAGNESIUM: MAGNESIUM: 2.5 mg/dL — AB (ref 1.7–2.4)

## 2016-08-06 LAB — PROCALCITONIN: PROCALCITONIN: 59.76 ng/mL

## 2016-08-06 LAB — PHOSPHORUS: Phosphorus: 3.8 mg/dL (ref 2.5–4.6)

## 2016-08-06 MED ORDER — DEXTROSE-NACL 5-0.45 % IV SOLN
INTRAVENOUS | Status: DC
  Administered 2016-08-06: 11:00:00 via INTRAVENOUS
  Administered 2016-08-06: 100 mL via INTRAVENOUS
  Administered 2016-08-07 – 2016-08-08 (×2): via INTRAVENOUS
  Administered 2016-08-09: 1000 mL via INTRAVENOUS

## 2016-08-06 MED ORDER — HALOPERIDOL LACTATE 5 MG/ML IJ SOLN
2.0000 mg | INTRAMUSCULAR | Status: DC | PRN
  Administered 2016-08-06: 2 mg via INTRAVENOUS
  Filled 2016-08-06 (×2): qty 1

## 2016-08-06 NOTE — Progress Notes (Signed)
PROGRESS NOTE                                                                                                                                                                                                             Patient Demographics:    Regina Richards, is a 70 y.o. female, DOB - May 02, 1946, NVB:[id number]  Admit date - 08/03/2016   Admitting Physician No admitting provider for patient encounter.  Outpatient Primary MD for the patient is No PCP Per Patient  LOS - 3   Chief Complaint  Patient presents with  . Altered Mental Status       Brief Narrative  70 year old female without significant past medical history, presents with altered mental status and sepsis secondary to infected bilateral obstructive renal stone, in septic shock, required intubation and pressor initially, successfully extubated and weaned off pressor on 12/8, at this bilateral J stent placement by urology, workup significant for acute renal failure, bacteremia and multiple electrolytes abnormalities, care was transferred [facility]10.  Significant events: 12/7 admit, to OR for bilateral ureteral stent placement. ETT 12/8 > 12/8   Subjective:    Regina Richards today Is awake, but confused, denies any headache, chest pain, abdominal pain nausea or vomiting .    Assessment  & Plan :    Active Problems:   Sepsis ([place])   Septic shock ([place])   Acute kidney injury ([place])   Acute pyelonephritis   Acute hypoxemic respiratory failure ([facility])   Septic shock secondary to infected obstructive uropathy and Escherichia coli bacteremia - Patient presents with sepsis, cruising tachypnea, tachycardia, fever and leukocytosis, at one point hypotensive requiring pressors, with evidence of end organ damage including acute renal failure and elevated lactic acid. - Currently weaned off pressors, continue to trend her calcitonin, lactic acid within normal limits. - Continue  with IV  fluid  Obstructing left ureteral stone and right renal pelvic stone with sepsis - Urology consult appreciated, status post double-J stent placement 12/7. - Renal Functions improving gradually - Urine culture growing Escherichia coli  Escherichia coli bacteremia - Continue to above, continue with IV Zosyn,  - We'll repeat blood cultures  Acute renal failure - Most likely related to affective uropathy and hypotension, improving with IV fluids, her baseline is unknown  Thrombocytopenia - Baseline is unknown, but this is most likely  due to sepsis, continue to monitor, no indication for transfusion, continue with SCD for DVT prophylaxis.  Elevated troponins - Denies any chest pain or shortness of breath, this is most likely in the setting of acute renal failure and sepsis, will trend  Hypernatremia - Secondary to volume depletion and dehydration, will change fluid to D5 half-normal saline  Encephalopathy - Patient 2 months confused, this is most likely due to hypoactive delirium, continue supportive care.  Patient with right IJ CVL, will discontinue in 24 H if  vital signs are stable.  Code Status : Full  Family Communication  : Spoke with sister via phone  Disposition Plan  : pending further work up, may downgrade from step down in am  Consults  :  PCCM, Urology  Procedures  : status post double-J stent placement 12/7  DVT Prophylaxis  : SCDs   Lab Results  Component Value Date   PLT 36 (L) 08/06/2016    Antibiotics  :    Anti-infectives    Start     Dose/Rate Route Frequency Ordered Stop   08/05/16 1700  vancomycin (VANCOCIN) IVPB 1000 mg/200 mL premix  Status:  Discontinued     1,000 mg 200 mL/hr over 60 Minutes Intravenous Every 48 hours 08/03/16 1947 08/04/16 0035   08/04/16 0000  piperacillin-tazobactam (ZOSYN) IVPB 3.375 g     3.375 g 12.5 mL/hr over 240 Minutes Intravenous Every 8 hours 08/03/16 1947     08/03/16 1730  piperacillin-tazobactam (ZOSYN) IVPB  3.375 g     3.375 g 100 mL/hr over 30 Minutes Intravenous  Once 08/03/16 1729 08/03/16 1815   08/03/16 1730  vancomycin (VANCOCIN) IVPB 1000 mg/200 mL premix     1,000 mg 200 mL/hr over 60 Minutes Intravenous  Once 08/03/16 1729 08/03/16 2118        Objective:   Vitals:   08/06/16 0500 08/06/16 0600 08/06/16 0700 08/06/16 0701  BP:  (!) 145/83 (!) 151/98   Pulse:  83 78   Resp:  13 13   Temp:    98.4 F (36.9 C)  TempSrc:    Oral  SpO2:  99% 98%   Weight: 80.2 kg (176 lb 12.9 oz)     Height:        Wt Readings from Last 3 Encounters:  08/06/16 80.2 kg (176 lb 12.9 oz)     Intake/Output Summary (Last 24 hours) at 08/06/16 1019 Last data filed at 08/06/16 0700  Gross per 24 hour  Intake          2302.72 ml  Output             1500 ml  Net           802.72 ml     Physical Exam  Awake Alert,confused Supple Neck,No JVD, right IJ CVL  Symmetrical Chest wall movement, Good air movement bilaterally, CTAB RRR,No Gallops,Rubs or new Murmurs, No Parasternal Heave +ve B.Sounds, Abd Soft, No tenderness, No rebound - guarding or rigidity. No Cyanosis, Clubbing or edema, No new Rash or bruise     Data Review:    CBC  Recent Labs Lab 08/03/16 1655 08/04/16 0201 08/05/16 0555 08/05/16 1730 08/06/16 0548  WBC 5.9 15.5* 13.1* 14.0* 11.2*  HGB 10.8* 10.0* 9.0* 9.2* 8.8*  HCT 33.8* 31.9* 28.9* 29.4* 28.4*  PLT 41* 31* 32* 29* 36*  MCV 78.4 80.4 79.6 79.2 79.8  MCH 25.1* 25.2* 24.8* 24.8* 24.7*  MCHC 32.0 31.3 31.1 31.3 31.0  RDW  17.2* 17.5* 17.5* 17.4* 17.7*  LYMPHSABS 0.3*  --   --  0.8 0.7  MONOABS 0.1  --   --  1.2* 1.4*  EOSABS 0.0  --   --  0.0 0.0  BASOSABS 0.0  --   --  0.0 0.0    Chemistries   Recent Labs Lab 08/03/16 1655 08/04/16 0201 08/04/16 1204 08/05/16 0555 08/05/16 1143 08/06/16 0059 08/06/16 0548  NA 134* 140 140 144  --  149*  --   K 3.1* 3.5 4.2 3.9  --  3.6  --   CL 100* 108 109 113*  --  120*  --   CO2 17* 21* 23 24  --  23  --    GLUCOSE 127* 87 102* 96  --  110*  --   BUN 55* 54* 55* 56*  --  51*  --   CREATININE 3.89* 3.06* 2.54* 2.26*  --  1.95*  --   CALCIUM 7.7* 7.1* 7.3* 7.2*  --  7.7*  --   MG  --  1.6* 3.0*  --  2.6*  --  2.5*  AST 112* 96*  --   --   --   --  60*  ALT 70* 63*  --   --   --   --  57*  ALKPHOS 188* 92  --   --   --   --  127*  BILITOT 0.8 0.9  --   --   --   --  1.1   ------------------------------------------------------------------------------------------------------------------ No results for input(s): CHOL, HDL, LDLCALC, TRIG, CHOLHDL, LDLDIRECT in the last 72 hours.  No results found for: HGBA1C ------------------------------------------------------------------------------------------------------------------ No results for input(s): TSH, T4TOTAL, T3FREE, THYROIDAB in the last 72 hours.  Invalid input(s): FREET3 ------------------------------------------------------------------------------------------------------------------ No results for input(s): VITAMINB12, FOLATE, FERRITIN, TIBC, IRON, RETICCTPCT in the last 72 hours.  Coagulation profile  Recent Labs Lab 08/03/16 1655 08/05/16 1730  INR 1.64 1.20    No results for input(s): DDIMER in the last 72 hours.  Cardiac Enzymes  Recent Labs Lab 08/06/16 0548  TROPONINI 0.05*   ------------------------------------------------------------------------------------------------------------------ No results found for: BNP  Inpatient Medications  Scheduled Meds: . chlorhexidine gluconate (MEDLINE KIT)  15 mL Mouth Rinse BID  . famotidine (PEPCID) IV  20 mg Intravenous Q24H  . mouth rinse  15 mL Mouth Rinse QID  . PARoxetine  40 mg Oral Daily  . piperacillin-tazobactam (ZOSYN)  IV  3.375 g Intravenous Q8H   Continuous Infusions: . dexmedetomidine 0.3 mcg/kg/hr (08/06/16 0719)  . dextrose 5 % and 0.45% NaCl     PRN Meds:.hydrALAZINE, lip balm  Micro Results Recent Results (from the past 240 hour(s))  Urine culture      Status: Abnormal   Collection Time: 08/03/16  5:10 PM  Result Value Ref Range Status   Specimen Description URINE, RANDOM  Final   Special Requests NONE  Final   Culture >=100,000 COLONIES/mL ESCHERICHIA COLI (A)  Final   Report Status 08/05/2016 FINAL  Final   Organism ID, Bacteria ESCHERICHIA COLI (A)  Final      Susceptibility   Escherichia coli - MIC*    AMPICILLIN >=32 RESISTANT Resistant     CEFAZOLIN <=4 SENSITIVE Sensitive     CEFTRIAXONE <=1 SENSITIVE Sensitive     CIPROFLOXACIN <=0.25 SENSITIVE Sensitive     GENTAMICIN <=1 SENSITIVE Sensitive     IMIPENEM <=0.25 SENSITIVE Sensitive     NITROFURANTOIN <=16 SENSITIVE Sensitive     TRIMETH/SULFA <=20 SENSITIVE Sensitive  AMPICILLIN/SULBACTAM 16 INTERMEDIATE Intermediate     PIP/TAZO <=4 SENSITIVE Sensitive     Extended ESBL NEGATIVE Sensitive     * >=100,000 COLONIES/mL ESCHERICHIA COLI  Culture, blood (Routine x 2)     Status: Abnormal (Preliminary result)   Collection Time: 08/03/16  5:20 PM  Result Value Ref Range Status   Specimen Description BLOOD RIGHT ANTECUBITAL  Final   Special Requests BOTTLES DRAWN AEROBIC AND ANAEROBIC 5CC  Final   Culture  Setup Time   Final    GRAM NEGATIVE RODS IN BOTH AEROBIC AND ANAEROBIC BOTTLES CRITICAL VALUE NOTED.  VALUE IS CONSISTENT WITH PREVIOUSLY REPORTED AND CALLED VALUE.    Culture ESCHERICHIA COLI (A)  Final   Report Status PENDING  Incomplete  Culture, blood (Routine x 2)     Status: Abnormal (Preliminary result)   Collection Time: 08/03/16  5:30 PM  Result Value Ref Range Status   Specimen Description BLOOD LEFT WRIST  Final   Special Requests BOTTLES DRAWN AEROBIC AND ANAEROBIC 5CC  Final   Culture  Setup Time   Final    GRAM NEGATIVE RODS IN BOTH AEROBIC AND ANAEROBIC BOTTLES Organism ID to follow CRITICAL RESULT CALLED TO, READ BACK BY AND VERIFIED WITH: Sheffield Slider, [place] AT 0349 ON [id number] BY Rhea Bleacher    Culture ESCHERICHIA COLI repeating susceptibility   (A)  Final   Report Status PENDING  Incomplete  Blood Culture ID Panel (Reflexed)     Status: Abnormal   Collection Time: 08/03/16  5:30 PM  Result Value Ref Range Status   Enterococcus species NOT DETECTED NOT DETECTED Final   Listeria monocytogenes NOT DETECTED NOT DETECTED Final   Staphylococcus species NOT DETECTED NOT DETECTED Final   Staphylococcus aureus NOT DETECTED NOT DETECTED Final   Streptococcus species NOT DETECTED NOT DETECTED Final   Streptococcus agalactiae NOT DETECTED NOT DETECTED Final   Streptococcus pneumoniae NOT DETECTED NOT DETECTED Final   Streptococcus pyogenes NOT DETECTED NOT DETECTED Final   Acinetobacter baumannii NOT DETECTED NOT DETECTED Final   Enterobacteriaceae species DETECTED (A) NOT DETECTED Final    Comment: CRITICAL RESULT CALLED TO, READ BACK BY AND VERIFIED WITH: Sheffield Slider, [place] AT [phone] ON [id number] BY S. YARBROUGH    Enterobacter cloacae complex NOT DETECTED NOT DETECTED Final   Escherichia coli DETECTED (A) NOT DETECTED Final    Comment: CRITICAL RESULT CALLED TO, READ BACK BY AND VERIFIED WITH: Sheffield Slider, [place] AT 5537 ON [id number] BY S. YARBROUGH    Klebsiella oxytoca NOT DETECTED NOT DETECTED Final   Klebsiella pneumoniae NOT DETECTED NOT DETECTED Final   Proteus species NOT DETECTED NOT DETECTED Final   Serratia marcescens NOT DETECTED NOT DETECTED Final   Carbapenem resistance NOT DETECTED NOT DETECTED Final   Haemophilus influenzae NOT DETECTED NOT DETECTED Final   Neisseria meningitidis NOT DETECTED NOT DETECTED Final   Pseudomonas aeruginosa NOT DETECTED NOT DETECTED Final   Candida albicans NOT DETECTED NOT DETECTED Final   Candida glabrata NOT DETECTED NOT DETECTED Final   Candida krusei NOT DETECTED NOT DETECTED Final   Candida parapsilosis NOT DETECTED NOT DETECTED Final   Candida tropicalis NOT DETECTED NOT DETECTED Final  MRSA PCR Screening     Status: None   Collection Time: 08/04/16 12:30 AM  Result Value Ref Range  Status   MRSA by PCR NEGATIVE NEGATIVE Final    Comment:        The GeneXpert MRSA Assay (FDA approved for NASAL specimens only), is one component  of a comprehensive MRSA colonization surveillance program. It is not intended to diagnose MRSA infection nor to guide or monitor treatment for MRSA infections.     Radiology Reports Ct Abdomen Pelvis Wo Contrast  Result Date: 08/03/2016 CLINICAL DATA:  Sepsis with nausea, vomiting and diarrhea times 1-2 days. EXAM: CT ABDOMEN AND PELVIS WITHOUT CONTRAST TECHNIQUE: Multidetector CT imaging of the abdomen and pelvis was performed following the standard protocol without IV contrast. COMPARISON:  CXR from the same day FINDINGS: Lower chest: The visualized heart is within normal limits for size. There is no pericardial effusion. There is coronary arteriosclerosis as well as calcifications at the level of the aortic root. Trace bilateral pleural effusions. There is mild interstitial edema. There is bibasilar atelectasis with more confluent patchy airspace opacities in both lower lobes. Pneumonia is not excluded. Hepatobiliary: The unenhanced liver is unremarkable. The gallbladder is distended without calculi or wall thickening. No biliary dilatation. Pancreas: Unremarkable appearance of the pancreas. Spleen: No splenomegaly. Adrenals/Urinary Tract: There is a 4 mm left proximal ureteral stone causing mild to moderate left-sided hydronephrosis. There is an 8 x 9 mm nonobstructing right renal pelvic stone with extrarenal pelvis. Bilateral nephrolithiasis is noted. There are at least 3-4 punctate calcifications within the bladder which likely reflect previously passed stones. Bilateral perinephric fat stranding is noted. Cannot exclude urinary tract infection and pyelonephritis given lack of IV contrast. Stomach/Bowel: Normal bowel rotation. No small bowel dilatation. Moderate stool burden in the rectosigmoid. There is mild transmural thickening of the sigmoid  and colitis is not entirely exclude. Vascular/Lymphatic: The abdominal aorta is tortuous and partially calcified without aneurysm. Atherosclerosis of the left common iliac artery is also seen extending into the internal iliac. No pathologically enlarged lymph nodes. Reproductive: The uterus and adnexa are unremarkable. Other: Mild mesenteric fatty edema. Musculoskeletal: There is dextroconvex curvature of the lumbar spine with spondylosis. No acute osseous abnormality nor suspicious osseous abnormalities. IMPRESSION: 1. Bilateral nephrolithiasis with 4 mm left proximal ureteral stone causing mild-to-moderate hydroureteronephrosis. There is an 8 x 9 mm nonobstructing right renal pelvic calculus within extrarenal pelvis. Bilateral perinephric fat stranding is seen. Cannot exclude urinary tract infection or pyelonephritis with these findings and in the absence of IV contrast. 2. Bilateral nephrolithiasis and tiny bladder calculi. 3. There is bibasilar patchy atelectasis with trace bilateral pleural effusions. More confluent areas of airspace opacity in both lower lobes cannot exclude pneumonia. 4. Mild transmural thickening of sigmoid colon. Findings are suspicious for colitis. Electronically Signed   By: Ashley Royalty M.D.   On: 08/03/2016 20:05   Ct Head Wo Contrast  Result Date: 08/03/2016 CLINICAL DATA:  70 year old female with confusion and altered mental status. Suspected sepsis. Initial encounter. EXAM: CT HEAD WITHOUT CONTRAST TECHNIQUE: Contiguous axial images were obtained from the base of the skull through the vertex without intravenous contrast. COMPARISON:  None. FINDINGS: Brain: Chronic encephalomalacia in the left frontal lobe along the anterior left frontal operculum. Mild ex vacuo enlargement of the adjacent left lateral ventricle. Mild superimposed other white matter hypodensity, mostly along the frontal horns. No other cortical encephalomalacia identified. No midline shift, ventriculomegaly, mass  effect, evidence of mass lesion, intracranial hemorrhage or evidence of cortically based acute infarction. Vascular: No suspicious intracranial vascular hyperdensity. Skull: No acute osseous abnormality identified. Sinuses/Orbits: Mild mucosal thickening right maxillary sinus. Trace fluid level right sphenoid sinus. Other Visualized paranasal sinuses and mastoids are well pneumatized. Other: Negative orbit and scalp soft tissues. IMPRESSION: 1.  No acute intracranial abnormality. 2.  Chronic anterior division left MCA infarct. 3. Minimal right maxillary and sphenoid sinus inflammation, significance doubtful. Electronically Signed   By: Genevie Ann M.D.   On: 08/03/2016 18:34   Dg Chest Port 1 View  Result Date: 08/05/2016 CLINICAL DATA:  Respiratory failure EXAM: PORTABLE CHEST 1 VIEW COMPARISON:  08/04/2016 FINDINGS: Endotracheal tube is been removed. Central venous catheter tip remains in the SVC. NG removed. Hypoventilation with bibasilar atelectasis unchanged from the prior study. Decrease in vascular congestion since prior study. IMPRESSION: Hypoventilation with bibasilar atelectasis unchanged. Decrease in vascular congestion. Endotracheal tube and NG tube have been removed. Central venous catheter tip remains in the SVC. Electronically Signed   By: Franchot Gallo M.D.   On: 08/05/2016 06:58   Dg Chest Port 1 View  Result Date: 08/04/2016 CLINICAL DATA:  Central line placement EXAM: PORTABLE CHEST 1 VIEW COMPARISON:  08/04/2016 at 00:17 FINDINGS: Right jugular central line has been placed. Tip is in the expected location of the SVC just below the azygos vein junction. Endotracheal tube tip is 3.6 cm above the carina. Nasogastric tube extends into the stomach and beyond the inferior edge of the image. Dense consolidation persists in the left base with milder curvilinear opacity in the right base, unchanged. No pneumothorax. IMPRESSION: 1. New right jugular central line.  No pneumothorax. 2. ET and NG tubes  appear satisfactorily positioned. 3. No significant interval change in the bilateral airspace opacities. Electronically Signed   By: Andreas Newport M.D.   On: 08/04/2016 01:59   Dg Chest Port 1 View  Result Date: 08/04/2016 CLINICAL DATA:  Encephalopathy.  Intubated. EXAM: PORTABLE CHEST 1 VIEW COMPARISON:  08/03/2016 FINDINGS: Endotracheal tube is 4.6 cm above the carina. Nasogastric tube extends into the stomach and beyond the inferior edge of the image. Airspace opacities are present in both bases, worsened on the left were there now was more confluent consolidation. Probable small left pleural effusion. No pneumothorax. IMPRESSION: 1.  Support equipment appears satisfactorily positioned. 2. Worsening airspace opacity in the left base. Electronically Signed   By: Andreas Newport M.D.   On: 08/04/2016 00:59   Dg Chest Portable 1 View  Result Date: 08/03/2016 CLINICAL DATA:  Altered mental status EXAM: PORTABLE CHEST 1 VIEW COMPARISON:  None. FINDINGS: Low volume chest with streaky basilar opacities. No edema, effusion, or pneumothorax. Normal heart size and mediastinal contours when accounting for leftward rotation. IMPRESSION: Low volume chest with basilar atelectasis. Electronically Signed   By: Monte Fantasia M.D.   On: 08/03/2016 17:25   Dg Abd Portable 1v  Result Date: 08/04/2016 CLINICAL DATA:  Abdominal distension. EXAM: PORTABLE ABDOMEN - 1 VIEW COMPARISON:  CT scan of August 03, 2016. FINDINGS: The bowel gas pattern is normal. Distal tip of nasogastric tube is seen in expected position of distal stomach. Bilateral ureteral stents are noted. Right renal calculi are noted as described on prior CT scan. IMPRESSION: Right nephrolithiasis. Bilateral ureteral stents are noted. No evidence of bowel obstruction or ileus. Electronically Signed   By: Marijo Conception, M.D.   On: 08/04/2016 08:35    Time Spent in minutes  35 minutes   Dayjah Selman M.D on 08/06/2016 at 10:19 AM  Between  7am to 7pm - Pager - [phone]  After 7pm go to [email or web address] - password [facility]  [facility] -  Office  [phone]

## 2016-08-06 NOTE — Progress Notes (Signed)
CRITICAL VALUE ALERT  Critical value received:  Troponin 0.05  Date of notification:  08/06/2016  Time of notification:  0700  Critical value read back:Yes.    Nurse who received alert:  Lorretta Harp RN  MD notified (1st page):  Dr. Waldron Labs  Time of first page:  0730  MD notified (2nd page):  Time of second page:  Responding MD:  Dr. Waldron Labs  Time MD responded:  [id number]

## 2016-08-06 NOTE — Progress Notes (Signed)
Pt bradys down into the 30's when drinking cold water.  Informed Dr. Waldron Labs.  See orders.  Irven Baltimore, RN

## 2016-08-07 ENCOUNTER — Inpatient Hospital Stay (HOSPITAL_COMMUNITY): Payer: Medicare Other

## 2016-08-07 LAB — CBC
HCT: 28.7 % — ABNORMAL LOW (ref 36.0–46.0)
Hemoglobin: 8.9 g/dL — ABNORMAL LOW (ref 12.0–15.0)
MCH: 23.9 pg — AB (ref 26.0–34.0)
MCHC: 31 g/dL (ref 30.0–36.0)
MCV: 76.9 fL — AB (ref 78.0–100.0)
PLATELETS: 42 10*3/uL — AB (ref 150–400)
RBC: 3.73 MIL/uL — AB (ref 3.87–5.11)
RDW: 17.6 % — AB (ref 11.5–15.5)
WBC: 13.2 10*3/uL — ABNORMAL HIGH (ref 4.0–10.5)

## 2016-08-07 LAB — CULTURE, BLOOD (ROUTINE X 2)

## 2016-08-07 LAB — BASIC METABOLIC PANEL
Anion gap: 7 (ref 5–15)
BUN: 42 mg/dL — AB (ref 6–20)
CO2: 20 mmol/L — ABNORMAL LOW (ref 22–32)
Calcium: 7.4 mg/dL — ABNORMAL LOW (ref 8.9–10.3)
Chloride: 116 mmol/L — ABNORMAL HIGH (ref 101–111)
Creatinine, Ser: 1.58 mg/dL — ABNORMAL HIGH (ref 0.44–1.00)
GFR calc Af Amer: 37 mL/min — ABNORMAL LOW (ref >60)
GFR, EST NON AFRICAN AMERICAN: 32 mL/min — AB (ref >60)
GLUCOSE: 87 mg/dL (ref 65–99)
POTASSIUM: 2.9 mmol/L — AB (ref 3.5–5.1)
Sodium: 143 mmol/L (ref 135–145)

## 2016-08-07 LAB — PROCALCITONIN: PROCALCITONIN: 29.32 ng/mL

## 2016-08-07 LAB — BLOOD GAS, ARTERIAL
ACID-BASE DEFICIT: 2.4 mmol/L — AB (ref 0.0–2.0)
Bicarbonate: 20 mmol/L (ref 20.0–28.0)
Drawn by: 308601
O2 Content: 2 L/min
O2 Saturation: 97.8 %
PCO2 ART: 27.7 mmHg — AB (ref 32.0–48.0)
PH ART: 7.473 — AB (ref 7.350–7.450)
Patient temperature: 37
pO2, Arterial: 97.9 mmHg (ref 83.0–108.0)

## 2016-08-07 LAB — PHOSPHORUS: Phosphorus: 1.5 mg/dL — ABNORMAL LOW (ref 2.5–4.6)

## 2016-08-07 LAB — MAGNESIUM: Magnesium: 2 mg/dL (ref 1.7–2.4)

## 2016-08-07 MED ORDER — POTASSIUM CHLORIDE CRYS ER 20 MEQ PO TBCR
20.0000 meq | EXTENDED_RELEASE_TABLET | Freq: Once | ORAL | Status: AC
  Administered 2016-08-07: 20 meq via ORAL
  Filled 2016-08-07: qty 1

## 2016-08-07 MED ORDER — HYDRALAZINE HCL 20 MG/ML IJ SOLN
10.0000 mg | Freq: Once | INTRAMUSCULAR | Status: AC
  Administered 2016-08-07: 10 mg via INTRAVENOUS
  Filled 2016-08-07: qty 1

## 2016-08-07 MED ORDER — DEXTROSE 5 % IV SOLN
2.0000 g | INTRAVENOUS | Status: DC
  Administered 2016-08-07 – 2016-08-09 (×3): 2 g via INTRAVENOUS
  Filled 2016-08-07 (×3): qty 2

## 2016-08-07 MED ORDER — POTASSIUM CHLORIDE CRYS ER 20 MEQ PO TBCR: 40.0000 meq | EXTENDED_RELEASE_TABLET | Freq: Two times a day (BID) | ORAL | Status: DC

## 2016-08-07 MED ORDER — SODIUM CHLORIDE 0.9 % IV SOLN
30.0000 meq | Freq: Once | INTRAVENOUS | Status: AC
  Administered 2016-08-07: 30 meq via INTRAVENOUS
  Filled 2016-08-07: qty 15

## 2016-08-07 MED ORDER — BISACODYL 10 MG RE SUPP
10.0000 mg | Freq: Every day | RECTAL | Status: DC | PRN
  Administered 2016-08-07: 10 mg via RECTAL
  Filled 2016-08-07: qty 1

## 2016-08-07 MED ORDER — IOPAMIDOL (ISOVUE-300) INJECTION 61%
INTRAVENOUS | Status: AC
  Filled 2016-08-07: qty 30

## 2016-08-07 MED ORDER — FLEET ENEMA 7-19 GM/118ML RE ENEM: 1.0000 | ENEMA | Freq: Once | RECTAL | Status: DC

## 2016-08-07 MED ORDER — BISACODYL 10 MG RE SUPP
10.0000 mg | Freq: Two times a day (BID) | RECTAL | Status: DC
  Administered 2016-08-09 (×2): 10 mg via RECTAL
  Filled 2016-08-07 (×2): qty 1

## 2016-08-07 MED ORDER — POTASSIUM CHLORIDE CRYS ER 20 MEQ PO TBCR
40.0000 meq | EXTENDED_RELEASE_TABLET | Freq: Once | ORAL | Status: AC
  Administered 2016-08-07: 40 meq via ORAL
  Filled 2016-08-07: qty 2

## 2016-08-07 MED ORDER — FAMOTIDINE 20 MG PO TABS
20.0000 mg | ORAL_TABLET | Freq: Every day | ORAL | Status: DC
  Administered 2016-08-07 – 2016-08-09 (×3): 20 mg via ORAL
  Filled 2016-08-07 (×3): qty 1

## 2016-08-07 MED ORDER — POTASSIUM PHOSPHATES 15 MMOLE/5ML IV SOLN
40.0000 meq | Freq: Once | INTRAVENOUS | Status: AC
  Administered 2016-08-07: 40 meq via INTRAVENOUS
  Filled 2016-08-07: qty 9.09

## 2016-08-07 MED ORDER — MAGNESIUM SULFATE 2 GM/50ML IV SOLN: 2.0000 g | Freq: Once | INTRAVENOUS | Status: DC

## 2016-08-07 MED ORDER — SIMETHICONE 40 MG/0.6ML PO SUSP
40.0000 mg | Freq: Four times a day (QID) | ORAL | Status: DC
  Administered 2016-08-07 – 2016-08-09 (×9): 40 mg via ORAL
  Filled 2016-08-07 (×13): qty 0.6

## 2016-08-07 NOTE — Progress Notes (Signed)
Reviewed at bedside.  Pt hemodynamically stable.  RN reports mild distention, small BM.  Bowel regimen in place - dulcolax + fleet).  Pt in no distress.  Hemodynamically stable.  AKI improving, PCT improving, lactate clearing.  K/phos being replaced. PCCM will be available PRN.  Please call back if new needs arise.    Noe Gens, NP-C [facility] Pgr: [phone] or if no answer [phone] 08/07/2016, 10:56 AM

## 2016-08-07 NOTE — Progress Notes (Signed)
PROGRESS NOTE                                                                                                                                                                                                             Patient Demographics:    Regina Richards, is a 70 y.o. female, DOB - October 15, 1945, [id number]  Admit date - 08/03/2016   Admitting Physician No admitting provider for patient encounter.  Outpatient Primary MD for the patient is No PCP Per Patient  LOS - 4   Chief Complaint  Patient presents with  . Altered Mental Status       Brief Narrative  70 year old female without significant past medical history, presents with altered mental status and sepsis secondary to infected bilateral obstructive renal stone, in septic shock, required intubation and pressor initially, successfully extubated and weaned off pressor on 12/8, at this bilateral J stent placement by urology, workup significant for acute renal failure, bacteremia and multiple electrolytes abnormalities, care was transferred to [facility] on 12/10.  Significant events: 12/7 admit, to OR for bilateral ureteral stent placement. ETT 12/8 > 12/8   Subjective:    Mckinley Coln today Is awake, but confused, denies any headache, chest pain, abdominal pain nausea or vomiting .    Assessment  & Plan :    Active Problems:   Sepsis ([place])   Septic shock ([place])   Acute kidney injury ([place])   Acute pyelonephritis   Acute hypoxemic respiratory failure ([facility])   Septic shock secondary to infected obstructive uropathy and Escherichia coli bacteremia - Patient presents with sepsis, cruising tachypnea, tachycardia, fever and leukocytosis, at one point hypotensive requiring pressors, with evidence of end organ damage including acute renal failure and elevated lactic acid. - Currently weaned off pressors, procalcitonin trending down, lactic acid within normal limits. - Continue  with IV  fluid  Obstructing left ureteral stone and right renal pelvic stone with sepsis - Urology consult appreciated, status post double-J stent placement 12/7. - Renal Functions improving gradually - Urine culture growing Escherichia coli  Escherichia coli bacteremia - Continue to above, treated with IV Zosyn, giving current susceptibilities, will change to Rocephin, a total of 14 days treatment - Follow on repeat blood cultures 12/10  Acute renal failure - Most likely related to affective uropathy and hypotension, improving with IV fluids, her baseline is  unknown  Thrombocytopenia - Baseline is unknown, but this is most likely due to sepsis, continue to monitor, no indication for transfusion, continue with SCD for DVT prophylaxis.  Elevated troponins - Denies any chest pain or shortness of breath, this is most likely in the setting of acute renal failure and sepsis, troponin trend  non-ACS pattern.  Hypernatremia - Secondary to volume depletion and dehydration, improving  D5 half-normal saline  Encephalopathy - Patient with significant confusion, this is most likely due to hypoactive delirium, continue supportive care.  Hypokalemia/hypophosphatemia/hypomagnesemia - Repeating, will continue to monitor closely.   Patient with abdominal distention today, abdominal exam nonacute, passing flatus and stools, x-ray with nonspecific gaseous distention, most likely to poor mobility, and possible early ileus with multiple electrolytes abnormalities, will continue with good bowel regimen, and will repeat electrolyte.  Patient with right IJ CVL, will discontinue today.  Code Status : Full  Family Communication  : Spoke with sister via phone  Disposition Plan  : pending further work up, may downgrade from step down in am  Consults  :  PCCM, Urology  Procedures  : status post double-J stent placement 12/7  DVT Prophylaxis  : SCDs   Lab Results  Component Value Date   PLT 42 (L)  08/07/2016    Antibiotics  :    Anti-infectives    Start     Dose/Rate Route Frequency Ordered Stop   08/05/16 1700  vancomycin (VANCOCIN) IVPB 1000 mg/200 mL premix  Status:  Discontinued     1,000 mg 200 mL/hr over 60 Minutes Intravenous Every 48 hours 08/03/16 1947 08/04/16 0035   08/04/16 0000  piperacillin-tazobactam (ZOSYN) IVPB 3.375 g     3.375 g 12.5 mL/hr over 240 Minutes Intravenous Every 8 hours 08/03/16 1947     08/03/16 1730  piperacillin-tazobactam (ZOSYN) IVPB 3.375 g     3.375 g 100 mL/hr over 30 Minutes Intravenous  Once 08/03/16 1729 08/03/16 1815   08/03/16 1730  vancomycin (VANCOCIN) IVPB 1000 mg/200 mL premix     1,000 mg 200 mL/hr over 60 Minutes Intravenous  Once 08/03/16 1729 08/03/16 2118        Objective:   Vitals:   08/07/16 0449 08/07/16 0500 08/07/16 0600 08/07/16 0730  BP: (!) 165/94  (!) 157/75   Pulse: (!) 44  82   Resp: (!) 21  17   Temp:    97.6 F (36.4 C)  TempSrc:    Oral  SpO2: 97%  100%   Weight:  82.9 kg (182 lb 12.2 oz)    Height:        Wt Readings from Last 3 Encounters:  08/07/16 82.9 kg (182 lb 12.2 oz)     Intake/Output Summary (Last 24 hours) at 08/07/16 1058 Last data filed at 08/07/16 0615  Gross per 24 hour  Intake          1891.68 ml  Output             1900 ml  Net            -8.32 ml     Physical Exam  Awake Alert,confused Supple Neck,No JVD, right IJ CVL  Symmetrical Chest wall movement, Good air movement bilaterally, CTAB RRR,No Gallops,Rubs or new Murmurs, No Parasternal Heave +ve B.Sounds, Abd Soft, No tenderness, No rebound - guarding or rigidity. No Cyanosis, Clubbing or edema, No new Rash or bruise     Data Review:    CBC  Recent Labs Lab 08/03/16 1655  08/04/16 0201 08/05/16 0555 08/05/16 1730 08/06/16 0548 08/07/16 0348  WBC 5.9 15.5* 13.1* 14.0* 11.2* 13.2*  HGB 10.8* 10.0* 9.0* 9.2* 8.8* 8.9*  HCT 33.8* 31.9* 28.9* 29.4* 28.4* 28.7*  PLT 41* 31* 32* 29* 36* 42*  MCV 78.4 80.4  79.6 79.2 79.8 76.9*  MCH 25.1* 25.2* 24.8* 24.8* 24.7* 23.9*  MCHC 32.0 31.3 31.1 31.3 31.0 31.0  RDW 17.2* 17.5* 17.5* 17.4* 17.7* 17.6*  LYMPHSABS 0.3*  --   --  0.8 0.7  --   MONOABS 0.1  --   --  1.2* 1.4*  --   EOSABS 0.0  --   --  0.0 0.0  --   BASOSABS 0.0  --   --  0.0 0.0  --     Chemistries   Recent Labs Lab 08/03/16 1655 08/04/16 0201 08/04/16 1204 08/05/16 0555 08/05/16 1143 08/06/16 0059 08/06/16 0548 08/07/16 0348  NA 134* 140 140 144  --  149*  --  143  K 3.1* 3.5 4.2 3.9  --  3.6  --  2.9*  CL 100* 108 109 113*  --  120*  --  116*  CO2 17* 21* 23 24  --  23  --  20*  GLUCOSE 127* 87 102* 96  --  110*  --  87  BUN 55* 54* 55* 56*  --  51*  --  42*  CREATININE 3.89* 3.06* 2.54* 2.26*  --  1.95*  --  1.58*  CALCIUM 7.7* 7.1* 7.3* 7.2*  --  7.7*  --  7.4*  MG  --  1.6* 3.0*  --  2.6*  --  2.5* 2.0  AST 112* 96*  --   --   --   --  60*  --   ALT 70* 63*  --   --   --   --  57*  --   ALKPHOS 188* 92  --   --   --   --  127*  --   BILITOT 0.8 0.9  --   --   --   --  1.1  --    ------------------------------------------------------------------------------------------------------------------ No results for input(s): CHOL, HDL, LDLCALC, TRIG, CHOLHDL, LDLDIRECT in the last 72 hours.  No results found for: HGBA1C ------------------------------------------------------------------------------------------------------------------ No results for input(s): TSH, T4TOTAL, T3FREE, THYROIDAB in the last 72 hours.  Invalid input(s): FREET3 ------------------------------------------------------------------------------------------------------------------ No results for input(s): VITAMINB12, FOLATE, FERRITIN, TIBC, IRON, RETICCTPCT in the last 72 hours.  Coagulation profile  Recent Labs Lab 08/03/16 1655 08/05/16 1730  INR 1.64 1.20    No results for input(s): DDIMER in the last 72 hours.  Cardiac Enzymes  Recent Labs Lab 08/06/16 1040 08/06/16 1654  08/06/16 2039  TROPONINI 0.04* 0.04* 0.04*   ------------------------------------------------------------------------------------------------------------------ No results found for: BNP  Inpatient Medications  Scheduled Meds: . bisacodyl  10 mg Rectal BID  . famotidine  20 mg Oral Daily  . magnesium sulfate 1 - 4 g bolus IVPB  2 g Intravenous Once  . mouth rinse  15 mL Mouth Rinse QID  . PARoxetine  40 mg Oral Daily  . piperacillin-tazobactam (ZOSYN)  IV  3.375 g Intravenous Q8H  . potassium chloride (KCL MULTIRUN) 30 mEq in 265 mL IVPB  30 mEq Intravenous Once  . potassium phosphate IVPB (mEq)  40 mEq Intravenous Once  . simethicone  40 mg Oral QID  . sodium phosphate  1 enema Rectal Once   Continuous Infusions: . dextrose 5 % and 0.45% NaCl 100 mL (  08/06/16 2000)   PRN Meds:.bisacodyl, haloperidol lactate, hydrALAZINE, lip balm  Micro Results Recent Results (from the past 240 hour(s))  Urine culture     Status: Abnormal   Collection Time: 08/03/16  5:10 PM  Result Value Ref Range Status   Specimen Description URINE, RANDOM  Final   Special Requests NONE  Final   Culture >=100,000 COLONIES/mL ESCHERICHIA COLI (A)  Final   Report Status 08/05/2016 FINAL  Final   Organism ID, Bacteria ESCHERICHIA COLI (A)  Final      Susceptibility   Escherichia coli - MIC*    AMPICILLIN >=32 RESISTANT Resistant     CEFAZOLIN <=4 SENSITIVE Sensitive     CEFTRIAXONE <=1 SENSITIVE Sensitive     CIPROFLOXACIN <=0.25 SENSITIVE Sensitive     GENTAMICIN <=1 SENSITIVE Sensitive     IMIPENEM <=0.25 SENSITIVE Sensitive     NITROFURANTOIN <=16 SENSITIVE Sensitive     TRIMETH/SULFA <=20 SENSITIVE Sensitive     AMPICILLIN/SULBACTAM 16 INTERMEDIATE Intermediate     PIP/TAZO <=4 SENSITIVE Sensitive     Extended ESBL NEGATIVE Sensitive     * >=100,000 COLONIES/mL ESCHERICHIA COLI  Culture, blood (Routine x 2)     Status: Abnormal   Collection Time: 08/03/16  5:20 PM  Result Value Ref Range Status    Specimen Description BLOOD RIGHT ANTECUBITAL  Final   Special Requests BOTTLES DRAWN AEROBIC AND ANAEROBIC 5CC  Final   Culture  Setup Time   Final    GRAM NEGATIVE RODS IN BOTH AEROBIC AND ANAEROBIC BOTTLES CRITICAL VALUE NOTED.  VALUE IS CONSISTENT WITH PREVIOUSLY REPORTED AND CALLED VALUE.    Culture (A)  Final    ESCHERICHIA COLI SUSCEPTIBILITIES PERFORMED ON PREVIOUS CULTURE WITHIN THE LAST 5 DAYS.    Report Status 08/07/2016 FINAL  Final  Culture, blood (Routine x 2)     Status: Abnormal   Collection Time: 08/03/16  5:30 PM  Result Value Ref Range Status   Specimen Description BLOOD LEFT WRIST  Final   Special Requests BOTTLES DRAWN AEROBIC AND ANAEROBIC 5CC  Final   Culture  Setup Time   Final    GRAM NEGATIVE RODS IN BOTH AEROBIC AND ANAEROBIC BOTTLES CRITICAL RESULT CALLED TO, READ BACK BY AND VERIFIED WITH: Sheffield Slider, [place] AT [id number] ON [id number] BY Rhea Bleacher    Culture ESCHERICHIA COLI (A)  Final   Report Status 08/07/2016 FINAL  Final   Organism ID, Bacteria ESCHERICHIA COLI  Final      Susceptibility   Escherichia coli - MIC*    AMPICILLIN >=32 RESISTANT Resistant     CEFAZOLIN <=4 SENSITIVE Sensitive     CEFEPIME <=1 SENSITIVE Sensitive     CEFTAZIDIME <=1 SENSITIVE Sensitive     CEFTRIAXONE <=1 SENSITIVE Sensitive     CIPROFLOXACIN <=0.25 SENSITIVE Sensitive     GENTAMICIN <=1 SENSITIVE Sensitive     IMIPENEM <=0.25 SENSITIVE Sensitive     TRIMETH/SULFA <=20 SENSITIVE Sensitive     AMPICILLIN/SULBACTAM 16 INTERMEDIATE Intermediate     PIP/TAZO <=4 SENSITIVE Sensitive     Extended ESBL NEGATIVE Sensitive     * ESCHERICHIA COLI  Blood Culture ID Panel (Reflexed)     Status: Abnormal   Collection Time: 08/03/16  5:30 PM  Result Value Ref Range Status   Enterococcus species NOT DETECTED NOT DETECTED Final   Listeria monocytogenes NOT DETECTED NOT DETECTED Final   Staphylococcus species NOT DETECTED NOT DETECTED Final   Staphylococcus aureus NOT DETECTED  NOT DETECTED Final  Streptococcus species NOT DETECTED NOT DETECTED Final   Streptococcus agalactiae NOT DETECTED NOT DETECTED Final   Streptococcus pneumoniae NOT DETECTED NOT DETECTED Final   Streptococcus pyogenes NOT DETECTED NOT DETECTED Final   Acinetobacter baumannii NOT DETECTED NOT DETECTED Final   Enterobacteriaceae species DETECTED (A) NOT DETECTED Final    Comment: CRITICAL RESULT CALLED TO, READ BACK BY AND VERIFIED WITH: Sheffield Slider, [place] AT 0853 ON [id number] BY S. YARBROUGH    Enterobacter cloacae complex NOT DETECTED NOT DETECTED Final   Escherichia coli DETECTED (A) NOT DETECTED Final    Comment: CRITICAL RESULT CALLED TO, READ BACK BY AND VERIFIED WITH: Sheffield Slider, [place] AT [id number] ON [id number] BY S. YARBROUGH    Klebsiella oxytoca NOT DETECTED NOT DETECTED Final   Klebsiella pneumoniae NOT DETECTED NOT DETECTED Final   Proteus species NOT DETECTED NOT DETECTED Final   Serratia marcescens NOT DETECTED NOT DETECTED Final   Carbapenem resistance NOT DETECTED NOT DETECTED Final   Haemophilus influenzae NOT DETECTED NOT DETECTED Final   Neisseria meningitidis NOT DETECTED NOT DETECTED Final   Pseudomonas aeruginosa NOT DETECTED NOT DETECTED Final   Candida albicans NOT DETECTED NOT DETECTED Final   Candida glabrata NOT DETECTED NOT DETECTED Final   Candida krusei NOT DETECTED NOT DETECTED Final   Candida parapsilosis NOT DETECTED NOT DETECTED Final   Candida tropicalis NOT DETECTED NOT DETECTED Final  MRSA PCR Screening     Status: None   Collection Time: 08/04/16 12:30 AM  Result Value Ref Range Status   MRSA by PCR NEGATIVE NEGATIVE Final    Comment:        The GeneXpert MRSA Assay (FDA approved for NASAL specimens only), is one component of a comprehensive MRSA colonization surveillance program. It is not intended to diagnose MRSA infection nor to guide or monitor treatment for MRSA infections.     Radiology Reports Ct Abdomen Pelvis Wo Contrast  Result  Date: 08/03/2016 CLINICAL DATA:  Sepsis with nausea, vomiting and diarrhea times 1-2 days. EXAM: CT ABDOMEN AND PELVIS WITHOUT CONTRAST TECHNIQUE: Multidetector CT imaging of the abdomen and pelvis was performed following the standard protocol without IV contrast. COMPARISON:  CXR from the same day FINDINGS: Lower chest: The visualized heart is within normal limits for size. There is no pericardial effusion. There is coronary arteriosclerosis as well as calcifications at the level of the aortic root. Trace bilateral pleural effusions. There is mild interstitial edema. There is bibasilar atelectasis with more confluent patchy airspace opacities in both lower lobes. Pneumonia is not excluded. Hepatobiliary: The unenhanced liver is unremarkable. The gallbladder is distended without calculi or wall thickening. No biliary dilatation. Pancreas: Unremarkable appearance of the pancreas. Spleen: No splenomegaly. Adrenals/Urinary Tract: There is a 4 mm left proximal ureteral stone causing mild to moderate left-sided hydronephrosis. There is an 8 x 9 mm nonobstructing right renal pelvic stone with extrarenal pelvis. Bilateral nephrolithiasis is noted. There are at least 3-4 punctate calcifications within the bladder which likely reflect previously passed stones. Bilateral perinephric fat stranding is noted. Cannot exclude urinary tract infection and pyelonephritis given lack of IV contrast. Stomach/Bowel: Normal bowel rotation. No small bowel dilatation. Moderate stool burden in the rectosigmoid. There is mild transmural thickening of the sigmoid and colitis is not entirely exclude. Vascular/Lymphatic: The abdominal aorta is tortuous and partially calcified without aneurysm. Atherosclerosis of the left common iliac artery is also seen extending into the internal iliac. No pathologically enlarged lymph nodes. Reproductive: The uterus and adnexa are unremarkable. Other:  Mild mesenteric fatty edema. Musculoskeletal: There is  dextroconvex curvature of the lumbar spine with spondylosis. No acute osseous abnormality nor suspicious osseous abnormalities. IMPRESSION: 1. Bilateral nephrolithiasis with 4 mm left proximal ureteral stone causing mild-to-moderate hydroureteronephrosis. There is an 8 x 9 mm nonobstructing right renal pelvic calculus within extrarenal pelvis. Bilateral perinephric fat stranding is seen. Cannot exclude urinary tract infection or pyelonephritis with these findings and in the absence of IV contrast. 2. Bilateral nephrolithiasis and tiny bladder calculi. 3. There is bibasilar patchy atelectasis with trace bilateral pleural effusions. More confluent areas of airspace opacity in both lower lobes cannot exclude pneumonia. 4. Mild transmural thickening of sigmoid colon. Findings are suspicious for colitis. Electronically Signed   By: Ashley Royalty M.D.   On: 08/03/2016 20:05   Ct Head Wo Contrast  Result Date: 08/03/2016 CLINICAL DATA:  70 year old female with confusion and altered mental status. Suspected sepsis. Initial encounter. EXAM: CT HEAD WITHOUT CONTRAST TECHNIQUE: Contiguous axial images were obtained from the base of the skull through the vertex without intravenous contrast. COMPARISON:  None. FINDINGS: Brain: Chronic encephalomalacia in the left frontal lobe along the anterior left frontal operculum. Mild ex vacuo enlargement of the adjacent left lateral ventricle. Mild superimposed other white matter hypodensity, mostly along the frontal horns. No other cortical encephalomalacia identified. No midline shift, ventriculomegaly, mass effect, evidence of mass lesion, intracranial hemorrhage or evidence of cortically based acute infarction. Vascular: No suspicious intracranial vascular hyperdensity. Skull: No acute osseous abnormality identified. Sinuses/Orbits: Mild mucosal thickening right maxillary sinus. Trace fluid level right sphenoid sinus. Other Visualized paranasal sinuses and mastoids are well  pneumatized. Other: Negative orbit and scalp soft tissues. IMPRESSION: 1.  No acute intracranial abnormality. 2. Chronic anterior division left MCA infarct. 3. Minimal right maxillary and sphenoid sinus inflammation, significance doubtful. Electronically Signed   By: Genevie Ann M.D.   On: 08/03/2016 18:34   Dg Chest Port 1 View  Result Date: 08/05/2016 CLINICAL DATA:  Respiratory failure EXAM: PORTABLE CHEST 1 VIEW COMPARISON:  08/04/2016 FINDINGS: Endotracheal tube is been removed. Central venous catheter tip remains in the SVC. NG removed. Hypoventilation with bibasilar atelectasis unchanged from the prior study. Decrease in vascular congestion since prior study. IMPRESSION: Hypoventilation with bibasilar atelectasis unchanged. Decrease in vascular congestion. Endotracheal tube and NG tube have been removed. Central venous catheter tip remains in the SVC. Electronically Signed   By: Franchot Gallo M.D.   On: 08/05/2016 06:58   Dg Chest Port 1 View  Result Date: 08/04/2016 CLINICAL DATA:  Central line placement EXAM: PORTABLE CHEST 1 VIEW COMPARISON:  08/04/2016 at 00:17 FINDINGS: Right jugular central line has been placed. Tip is in the expected location of the SVC just below the azygos vein junction. Endotracheal tube tip is 3.6 cm above the carina. Nasogastric tube extends into the stomach and beyond the inferior edge of the image. Dense consolidation persists in the left base with milder curvilinear opacity in the right base, unchanged. No pneumothorax. IMPRESSION: 1. New right jugular central line.  No pneumothorax. 2. ET and NG tubes appear satisfactorily positioned. 3. No significant interval change in the bilateral airspace opacities. Electronically Signed   By: Andreas Newport M.D.   On: 08/04/2016 01:59   Dg Chest Port 1 View  Result Date: 08/04/2016 CLINICAL DATA:  Encephalopathy.  Intubated. EXAM: PORTABLE CHEST 1 VIEW COMPARISON:  08/03/2016 FINDINGS: Endotracheal tube is 4.6 cm above the  carina. Nasogastric tube extends into the stomach and beyond the inferior edge of  the image. Airspace opacities are present in both bases, worsened on the left were there now was more confluent consolidation. Probable small left pleural effusion. No pneumothorax. IMPRESSION: 1.  Support equipment appears satisfactorily positioned. 2. Worsening airspace opacity in the left base. Electronically Signed   By: Andreas Newport M.D.   On: 08/04/2016 00:59   Dg Chest Portable 1 View  Result Date: 08/03/2016 CLINICAL DATA:  Altered mental status EXAM: PORTABLE CHEST 1 VIEW COMPARISON:  None. FINDINGS: Low volume chest with streaky basilar opacities. No edema, effusion, or pneumothorax. Normal heart size and mediastinal contours when accounting for leftward rotation. IMPRESSION: Low volume chest with basilar atelectasis. Electronically Signed   By: Monte Fantasia M.D.   On: 08/03/2016 17:25   Dg Abd Portable 1v  Result Date: 08/07/2016 CLINICAL DATA:  Abdominal distension. EXAM: PORTABLE ABDOMEN - 1 VIEW COMPARISON:  08/04/2016 and CT abdomen pelvis 08/03/2016. FINDINGS: Mild nonspecific gaseous distention of small bowel and colon. There are bilateral ureteral stents in place. Nasogastric tube has been removed. Dextroconvex scoliosis, centered in the mid lumbar spine. IMPRESSION: Mild nonspecific gaseous distention of small bowel and colon. Electronically Signed   By: Lorin Picket M.D.   On: 08/07/2016 07:07   Dg Abd Portable 1v  Result Date: 08/04/2016 CLINICAL DATA:  Abdominal distension. EXAM: PORTABLE ABDOMEN - 1 VIEW COMPARISON:  CT scan of August 03, 2016. FINDINGS: The bowel gas pattern is normal. Distal tip of nasogastric tube is seen in expected position of distal stomach. Bilateral ureteral stents are noted. Right renal calculi are noted as described on prior CT scan. IMPRESSION: Right nephrolithiasis. Bilateral ureteral stents are noted. No evidence of bowel obstruction or ileus. Electronically  Signed   By: Marijo Conception, M.D.   On: 08/04/2016 08:35    Time Spent in minutes  35 minutes   Karthika Glasper M.D on 08/07/2016 at 10:58 AM  Between 7am to 7pm - Pager - [phone]  After 7pm go to [email or web address] - password [facility]  [facility] -  Office  [phone]

## 2016-08-07 NOTE — Progress Notes (Signed)
Center Line Progress Note Patient Name: Regina Richards DOB: 09-12-1945 MRN: [id number]   Date of Service  08/07/2016  HPI/Events of Note    eICU Interventions  K Phos given     Intervention Category Minor Interventions: Electrolytes abnormality - evaluation and management  Witney Huie S. 08/07/2016, 6:08 AM

## 2016-08-07 NOTE — Progress Notes (Signed)
Date:  August 07, 2016 Chart reviewed for concurrent status and case management needs. Will continue to follow patient progress. Remains septic and on iv vasopressors. Discharge Planning: following for needs Expected discharge date: [id number] Velva Harman, BSN, [place], [place]

## 2016-08-07 NOTE — Progress Notes (Addendum)
Patient restless in bed, still trying to get out of bed. She is currently on 2L oxygen via nasal cannula and saturation in the high 90s. Chest and lungs sound clear. Abdomen more distended than what it was at the start of this shift, hypoactive bowel sounds noted in all 4 quadrants, tympanic to percussion and firm to palpation, she denied any pain. E- link MD made aware of these findings. Order for a KUB received and Ducolax for constipation. Patient will continue to be monitored thru this shift.

## 2016-08-07 NOTE — Final Consult Note (Signed)
Assessment: 1.  Obstructing left ureteral stone with sepsis - She now has a stent in place.  On antibiotics she has made significant improvement. 2.  Right renal pelvis stone - she was found to have a stone in the renal pelvis with the potential to cause obstruction and pain.  I therefore placed a stent on the right side as well.  This will need to be treated with either lithotripsy or ureteroscopy.  Plan:  1.  Continue antibiotics for 2 weeks upon discharge. 2.  She will follow-up with me as an outpatient to schedule definitive management of her bilateral stones.    Subjective: Patient reports   Objective: Vital signs in last 24 hours: Temp:  [97.2 F (36.2 C)-98.1 F (36.7 C)] 97.7 F (36.5 C) (12/11 0309) Pulse Rate:  [44-114] 82 (12/11 0600) Resp:  [10-26] 17 (12/11 0600) BP: (109-217)/(64-111) 157/75 (12/11 0600) SpO2:  [96 %-100 %] 100 % (12/11 0600) Weight:  [182 lb 12.2 oz (82.9 kg)] 182 lb 12.2 oz (82.9 kg) (12/11 0500)A  Intake/Output from previous day: 12/10 0701 - 12/11 0700 In: 1908.8 [I.V.:1586.3; IV Piggyback:222.5] Out: 2200 [Urine:2200] Intake/Output this shift: No intake/output data recorded.  History reviewed. No pertinent past medical history.  Physical Exam:  Lungs - Normal respiratory effort, chest expands symmetrically.  Abdomen - Soft, non-tender & non-distended.  Lab Results:  Recent Labs  08/05/16 1730 08/06/16 0548 08/07/16 0348  WBC 14.0* 11.2* 13.2*  HGB 9.2* 8.8* 8.9*  HCT 29.4* 28.4* 28.7*   BMET  Recent Labs  08/06/16 0059 08/07/16 0348  NA 149* 143  K 3.6 2.9*  CL 120* 116*  CO2 23 20*  GLUCOSE 110* 87  BUN 51* 42*  CREATININE 1.95* 1.58*  CALCIUM 7.7* 7.4*   No results for input(s): LABURIN in the last 72 hours. Results for orders placed or performed during the hospital encounter of 08/03/16  Urine culture     Status: Abnormal   Collection Time: 08/03/16  5:10 PM  Result Value Ref Range Status   Specimen  Description URINE, RANDOM  Final   Special Requests NONE  Final   Culture >=100,000 COLONIES/mL ESCHERICHIA COLI (A)  Final   Report Status 08/05/2016 FINAL  Final   Organism ID, Bacteria ESCHERICHIA COLI (A)  Final      Susceptibility   Escherichia coli - MIC*    AMPICILLIN >=32 RESISTANT Resistant     CEFAZOLIN <=4 SENSITIVE Sensitive     CEFTRIAXONE <=1 SENSITIVE Sensitive     CIPROFLOXACIN <=0.25 SENSITIVE Sensitive     GENTAMICIN <=1 SENSITIVE Sensitive     IMIPENEM <=0.25 SENSITIVE Sensitive     NITROFURANTOIN <=16 SENSITIVE Sensitive     TRIMETH/SULFA <=20 SENSITIVE Sensitive     AMPICILLIN/SULBACTAM 16 INTERMEDIATE Intermediate     PIP/TAZO <=4 SENSITIVE Sensitive     Extended ESBL NEGATIVE Sensitive     * >=100,000 COLONIES/mL ESCHERICHIA COLI  Culture, blood (Routine x 2)     Status: Abnormal (Preliminary result)   Collection Time: 08/03/16  5:20 PM  Result Value Ref Range Status   Specimen Description BLOOD RIGHT ANTECUBITAL  Final   Special Requests BOTTLES DRAWN AEROBIC AND ANAEROBIC 5CC  Final   Culture  Setup Time   Final    GRAM NEGATIVE RODS IN BOTH AEROBIC AND ANAEROBIC BOTTLES CRITICAL VALUE NOTED.  VALUE IS CONSISTENT WITH PREVIOUSLY REPORTED AND CALLED VALUE.    Culture ESCHERICHIA COLI (A)  Final   Report Status PENDING  Incomplete  Culture, blood (Routine x 2)     Status: Abnormal (Preliminary result)   Collection Time: 08/03/16  5:30 PM  Result Value Ref Range Status   Specimen Description BLOOD LEFT WRIST  Final   Special Requests BOTTLES DRAWN AEROBIC AND ANAEROBIC 5CC  Final   Culture  Setup Time   Final    GRAM NEGATIVE RODS IN BOTH AEROBIC AND ANAEROBIC BOTTLES Organism ID to follow CRITICAL RESULT CALLED TO, READ BACK BY AND VERIFIED WITH: Sheffield Slider, [place] AT [id number] ON [id number] BY Rhea Bleacher    Culture ESCHERICHIA COLI repeating susceptibility  (A)  Final   Report Status PENDING  Incomplete  Blood Culture ID Panel (Reflexed)     Status:  Abnormal   Collection Time: 08/03/16  5:30 PM  Result Value Ref Range Status   Enterococcus species NOT DETECTED NOT DETECTED Final   Listeria monocytogenes NOT DETECTED NOT DETECTED Final   Staphylococcus species NOT DETECTED NOT DETECTED Final   Staphylococcus aureus NOT DETECTED NOT DETECTED Final   Streptococcus species NOT DETECTED NOT DETECTED Final   Streptococcus agalactiae NOT DETECTED NOT DETECTED Final   Streptococcus pneumoniae NOT DETECTED NOT DETECTED Final   Streptococcus pyogenes NOT DETECTED NOT DETECTED Final   Acinetobacter baumannii NOT DETECTED NOT DETECTED Final   Enterobacteriaceae species DETECTED (A) NOT DETECTED Final    Comment: CRITICAL RESULT CALLED TO, READ BACK BY AND VERIFIED WITH: Sheffield Slider, PHARM AT [id number] ON [id number] BY S. YARBROUGH    Enterobacter cloacae complex NOT DETECTED NOT DETECTED Final   Escherichia coli DETECTED (A) NOT DETECTED Final    Comment: CRITICAL RESULT CALLED TO, READ BACK BY AND VERIFIED WITH: Sheffield Slider, PHARM AT [id number] ON [id number] BY S. YARBROUGH    Klebsiella oxytoca NOT DETECTED NOT DETECTED Final   Klebsiella pneumoniae NOT DETECTED NOT DETECTED Final   Proteus species NOT DETECTED NOT DETECTED Final   Serratia marcescens NOT DETECTED NOT DETECTED Final   Carbapenem resistance NOT DETECTED NOT DETECTED Final   Haemophilus influenzae NOT DETECTED NOT DETECTED Final   Neisseria meningitidis NOT DETECTED NOT DETECTED Final   Pseudomonas aeruginosa NOT DETECTED NOT DETECTED Final   Candida albicans NOT DETECTED NOT DETECTED Final   Candida glabrata NOT DETECTED NOT DETECTED Final   Candida krusei NOT DETECTED NOT DETECTED Final   Candida parapsilosis NOT DETECTED NOT DETECTED Final   Candida tropicalis NOT DETECTED NOT DETECTED Final  MRSA PCR Screening     Status: None   Collection Time: 08/04/16 12:30 AM  Result Value Ref Range Status   MRSA by PCR NEGATIVE NEGATIVE Final    Comment:        The GeneXpert MRSA Assay  (FDA approved for NASAL specimens only), is one component of a comprehensive MRSA colonization surveillance program. It is not intended to diagnose MRSA infection nor to guide or monitor treatment for MRSA infections.     Studies/Results: No results found.    Shelsie Tijerino C 08/07/2016, 7:04 AM

## 2016-08-07 NOTE — Progress Notes (Signed)
[place] Progress Note Patient Name: Regina Richards DOB: Jul 18, 1946 MRN: [id number]   Date of Service  08/07/2016  HPI/Events of Note  Called regarding patient's abdominal exam and respiratory pattern. Her abd has become distended and tympanitic. She is tachypneic, takes small breaths. Denies pain but also appears to be somnolent. RN indicates that she has progressive delirium. Entire picture concerning for evolving resp failure/ decompensation.   eICU Interventions  KUB ordered Dulcolax prn ABG now Will follow, have her evaluated at bedside if she changes.      Intervention Category Intermediate Interventions: Other:  Peggi Yono S. 08/07/2016, 4:17 AM

## 2016-08-07 NOTE — Progress Notes (Signed)
Patient becoming increasingly confused, trying to get out of bed saying " I'm ready to go home". She received 2mg  of Haldol at 2140- this seems to make her agitation worse. She is requiring a lot of redirecting and reorientation and high safety risk.[facility] floor coverage was made aware and order for a Tele sitter was received. Patient was educated on the purpose of the Telesitter, she will continue to be monitored through out this shift.

## 2016-08-07 NOTE — Progress Notes (Signed)
Pharmacy Antibiotic Note  Regina Richards is a 70 y.o. female admitted on 08/03/2016 with sepsis.  Pharmacy has been consulted for zosyn dosing.  E. Coli isolated from urine and blood cultures.    Day #4 antibiotics  SCr improving  WBC unchanged  No fevers  PCT trending down  UCx with sensitive E. Coli  Final susc for BCx pending  Plan:  Zosyn 3.375 gm iv q8h pending final Blood cx susceptilities.  Suggest de-escalation based on these results.  One would anticipate they would mirror urine cx results but taking longer than expected for susc to return.    Height: 5\' 2"  (157.5 cm) Weight: 182 lb 12.2 oz (82.9 kg) IBW/kg (Calculated) : 50.1  Temp (24hrs), Avg:97.5 F (36.4 C), Min:97.2 F (36.2 C), Max:98.1 F (36.7 C)   Recent Labs Lab 08/03/16 1718 08/03/16 2134 08/04/16 0201 08/04/16 1203 08/04/16 1204 08/05/16 0555 08/05/16 1730 08/06/16 0059 08/06/16 0548 08/07/16 0348  WBC  --   --  15.5*  --   --  13.1* 14.0*  --  11.2* 13.2*  CREATININE  --   --  3.06*  --  2.54* 2.26*  --  1.95*  --  1.58*  LATICACIDVEN 6.64* 3.26* 3.8* 1.5  --   --   --   --   --   --     Estimated Creatinine Clearance: 33.5 mL/min (by C-G formula based on SCr of 1.58 mg/dL (H)).    No Known Allergies  Antimicrobials this admission:  Vancomycin 12/7>> 12/8 Zosyn 12/7>>  Microbiology results:  12/7 BCx: 2/2 E coli, sens pending 12/7 UCx: E coli; Amp res, Unasyn intermed, o/w sens 12/8 MRSA PCR: neg 12/10 rpt BCx: sent  Doreene Eland, PharmD, BCPS.   Pager: [phone] 08/07/2016 8:41 AM

## 2016-08-07 NOTE — Progress Notes (Signed)
PHARMACIST - PHYSICIAN COMMUNICATION  DR:   TRH  CONCERNING: IV to Oral Route Change Policy  RECOMMENDATION: This patient is receiving famotidine by the intravenous route.  Based on criteria approved by the Pharmacy and Therapeutics Committee, the intravenous medication(s) is/are being converted to the equivalent oral dose form(s).   DESCRIPTION: These criteria include:  The patient is eating (either orally or via tube) and/or has been taking other orally administered medications for a least 24 hours  The patient has no evidence of active gastrointestinal bleeding or impaired GI absorption (gastrectomy, short bowel, patient on TNA or NPO).  If you have questions about this conversion, please contact the Pharmacy Department  []   [phone] )  [facility] []   [phone] )  [facility] []   [phone] )  [facility] []   [phone] )  [facility] [x]   [phone] )  [place], [place], [facility] 08/07/2016 8:49 AM

## 2016-08-07 NOTE — Consult Note (Signed)
Referring Provider: Dr. Waldron Labs Primary Care Physician:  No PCP Per Patient Primary Gastroenterologist:  Althia Forts  Reason for Consultation:  GI bleed; Abdominal distention  HPI: Regina Richards is a 70 y.o. female admitted on 08/03/16 for sepsis thought to be due to obstructive kidney stones with E. Coli bacteremia, who is extubated and off of pressors called for a consult today due to onset of bloody stools and abdominal distention. Had bilateral ureteral stents placed at admit. Had acute renal failure on admit that has improved with IVFs. Has had several episodes of bloody loose stools with persistent abdominal distention today. One episode of bloody stools today and one on 08/05/16 described as small amount each time. Denies any abdominal pain, nausea, or vomiting. No history of melena or hematemesis. Denies ever having a colonoscopy. Denies family history of colon cancer. Abd Xray today showed a nonobstructive bowel gas pattern. Noncontrast CT on admit showed mild sigmoid colon thickening and no other GI findings seen.  History reviewed. No pertinent past medical history.  Past Surgical History:  Procedure Laterality Date  . CYSTOSCOPY W/ URETERAL STENT PLACEMENT Bilateral 08/03/2016   Procedure: CYSTOSCOPY WITH BILATERAL STENT REPLACEMENT, BILATERAL RETROGRADE PYELOGRAM;  Surgeon: Kathie Rhodes, MD;  Location: [facility];  Service: Urology;  Laterality: Bilateral;    Prior to Admission medications   Medication Sig Start Date End Date Taking? Authorizing Provider  PARoxetine (PAXIL) 40 MG tablet Take 40 mg by mouth daily. 07/06/16   Historical Provider, MD    Scheduled Meds: . bisacodyl  10 mg Rectal BID  . cefTRIAXone (ROCEPHIN)  IV  2 g Intravenous Q24H  . famotidine  20 mg Oral Daily  . mouth rinse  15 mL Mouth Rinse QID  . PARoxetine  40 mg Oral Daily  . potassium chloride (KCL MULTIRUN) 30 mEq in 265 mL IVPB  30 mEq Intravenous Once  . simethicone  40 mg Oral QID  . sodium phosphate  1  enema Rectal Once   Continuous Infusions: . dextrose 5 % and 0.45% NaCl 100 mL (08/06/16 2000)   PRN Meds:.bisacodyl, haloperidol lactate, hydrALAZINE, lip balm  Allergies as of 08/03/2016  . (No Known Allergies)    History reviewed. No pertinent family history.  Social History   Social History  . Marital status: Married    Spouse name: N/A  . Number of children: N/A  . Years of education: N/A   Occupational History  . Not on file.   Social History Main Topics  . Smoking status: Never Smoker  . Smokeless tobacco: Never Used  . Alcohol use No  . Drug use: No  . Sexual activity: No   Other Topics Concern  . Not on file   Social History Narrative  . No narrative on file    Review of Systems: All negative except as stated above in HPI.  Physical Exam: Vital signs: Vitals:   08/07/16 1145 08/07/16 1545  BP:    Pulse:    Resp:    Temp: 97.6 F (36.4 C) 97.7 F (36.5 C)   BP 157/75 P 82  Last BM Date: 08/07/16 General:   Lethargic, elderly, no acute distress, well-nourished HEENT: anicteric sclera Neck: supple, nontender Lungs:  Coarse breath sounds Heart:  Regular rate and rhythm; no murmurs, clicks, rubs,  or gallops. Abdomen: marked distention without tenderness, active, high-pitched bowel sounds  Rectal:  Deferred Ext: no edema   GI:  Lab Results:  Recent Labs  08/05/16 1730 08/06/16 0548 08/07/16 0348  WBC 14.0* 11.2* 13.2*  HGB 9.2* 8.8* 8.9*  HCT 29.4* 28.4* 28.7*  PLT 29* 36* 42*   BMET  Recent Labs  08/05/16 0555 08/06/16 0059 08/07/16 0348  NA 144 149* 143  K 3.9 3.6 2.9*  CL 113* 120* 116*  CO2 24 23 20*  GLUCOSE 96 110* 87  BUN 56* 51* 42*  CREATININE 2.26* 1.95* 1.58*  CALCIUM 7.2* 7.7* 7.4*   LFT  Recent Labs  08/06/16 0548  PROT 5.5*  ALBUMIN 2.1*  AST 60*  ALT 57*  ALKPHOS 127*  BILITOT 1.1  BILIDIR 0.3  IBILI 0.8   PT/INR  Recent Labs  08/05/16 1730  LABPROT 15.3*  INR 1.20      Studies/Results: Dg Abd Portable 1v  Result Date: 08/07/2016 CLINICAL DATA:  Abdominal distension. EXAM: PORTABLE ABDOMEN - 1 VIEW COMPARISON:  08/04/2016 and CT abdomen pelvis 08/03/2016. FINDINGS: Mild nonspecific gaseous distention of small bowel and colon. There are bilateral ureteral stents in place. Nasogastric tube has been removed. Dextroconvex scoliosis, centered in the mid lumbar spine. IMPRESSION: Mild nonspecific gaseous distention of small bowel and colon. Electronically Signed   By: Lorin Picket M.D.   On: 08/07/2016 07:07    Impression/Plan: 70 yo with marked abdominal distention today and onset of bloody stools. She could have mild ischemic colitis causing the bleeding but is too stable to be having severe ischemic colitis with abdominal distention from that. Suspect her abdominal distention is multifactorial and could be due to an ileus due to her recent sepsis. Hypokalemia also a contributing factor. Will order a repeat noncontrast abd/pelvis CT to look for any obstructive features. I do not think a flex sig is needed at this time and would manage conservatively but if the CT is unrevealing and the bleeding continues then may need to reconsider that plan. Replace K to keep in the normal range (will request TRH to complete this need). Will follow.     LOS: 4 days   [place] C.  08/07/2016, 4:34 PM  Pager [phone]  If no answer or after 5 PM call [phone]

## 2016-08-08 DIAGNOSIS — K625 Hemorrhage of anus and rectum: Secondary | ICD-10-CM

## 2016-08-08 LAB — CBC
HCT: 33.9 % — ABNORMAL LOW (ref 36.0–46.0)
Hemoglobin: 10.7 g/dL — ABNORMAL LOW (ref 12.0–15.0)
MCH: 25.3 pg — AB (ref 26.0–34.0)
MCHC: 31.6 g/dL (ref 30.0–36.0)
MCV: 80.1 fL (ref 78.0–100.0)
PLATELETS: 55 10*3/uL — AB (ref 150–400)
RBC: 4.23 MIL/uL (ref 3.87–5.11)
RDW: 17.9 % — AB (ref 11.5–15.5)
WBC: 13.1 10*3/uL — ABNORMAL HIGH (ref 4.0–10.5)

## 2016-08-08 LAB — MAGNESIUM: Magnesium: 2 mg/dL (ref 1.7–2.4)

## 2016-08-08 LAB — BASIC METABOLIC PANEL
Anion gap: 8 (ref 5–15)
BUN: 30 mg/dL — AB (ref 6–20)
CHLORIDE: 111 mmol/L (ref 101–111)
CO2: 22 mmol/L (ref 22–32)
Calcium: 7.9 mg/dL — ABNORMAL LOW (ref 8.9–10.3)
Creatinine, Ser: 1.38 mg/dL — ABNORMAL HIGH (ref 0.44–1.00)
GFR calc Af Amer: 44 mL/min — ABNORMAL LOW (ref >60)
GFR calc non Af Amer: 38 mL/min — ABNORMAL LOW (ref >60)
GLUCOSE: 100 mg/dL — AB (ref 65–99)
POTASSIUM: 3.9 mmol/L (ref 3.5–5.1)
Sodium: 141 mmol/L (ref 135–145)

## 2016-08-08 LAB — PHOSPHORUS: Phosphorus: 2.9 mg/dL (ref 2.5–4.6)

## 2016-08-08 MED ORDER — POTASSIUM CHLORIDE CRYS ER 20 MEQ PO TBCR
40.0000 meq | EXTENDED_RELEASE_TABLET | Freq: Once | ORAL | Status: AC
  Administered 2016-08-08: 40 meq via ORAL
  Filled 2016-08-08: qty 2

## 2016-08-08 MED ORDER — AMLODIPINE BESYLATE 10 MG PO TABS
10.0000 mg | ORAL_TABLET | Freq: Every day | ORAL | Status: DC
  Administered 2016-08-08 – 2016-08-09 (×2): 10 mg via ORAL
  Filled 2016-08-08 (×3): qty 1

## 2016-08-08 MED ORDER — POTASSIUM CHLORIDE CRYS ER 20 MEQ PO TBCR
20.0000 meq | EXTENDED_RELEASE_TABLET | Freq: Once | ORAL | Status: AC
  Administered 2016-08-08: 20 meq via ORAL
  Filled 2016-08-08: qty 1

## 2016-08-08 NOTE — Progress Notes (Signed)
[facility] Gastroenterology Progress Note  Regina Richards 70 y.o. 26-Mar-1946   Subjective: Sitting in chair. Denies abdominal pain. No further rectal bleeding.  Objective: Vital signs in last 24 hours: Vitals:   08/08/16 1000 08/08/16 1100  BP: (!) 188/78 (!) 160/78  Pulse: 81 87  Resp: 16 19  Temp:     T 98.2  Physical Exam: Gen: lethargic, no acute distress HEENT: anicteric sclera CV: RRR Chest: CTA B Abd: distended, nontender, +BS  Lab Results:  Recent Labs  08/07/16 0348 08/08/16 0426  NA 143 141  K 2.9* 3.9  CL 116* 111  CO2 20* 22  GLUCOSE 87 100*  BUN 42* 30*  CREATININE 1.58* 1.38*  CALCIUM 7.4* 7.9*  MG 2.0 2.0  PHOS 1.5* 2.9    Recent Labs  08/06/16 0548  AST 60*  ALT 57*  ALKPHOS 127*  BILITOT 1.1  PROT 5.5*  ALBUMIN 2.1*    Recent Labs  08/05/16 1730 08/06/16 0548 08/07/16 0348 08/08/16 0426  WBC 14.0* 11.2* 13.2* 13.1*  NEUTROABS 12.0* 9.2*  --   --   HGB 9.2* 8.8* 8.9* 10.7*  HCT 29.4* 28.4* 28.7* 33.9*  MCV 79.2 79.8 76.9* 80.1  PLT 29* 36* 42* 55*    Recent Labs  08/05/16 1730  LABPROT 15.3*  INR 1.20      Assessment/Plan: Abdominal distention - suspect ileus. CT negative for obstruction or inflammation. Rectal bleeding has resolved and question a rectal outlet source. Continue supportive care. Outpt colonoscopy in 2018. Will sign off. Call if questions. F/U with me in 4-6 weeks.   [place] C. 08/08/2016, 12:15 PM   Pager [phone]  If no answer or after 5 PM call [phone] ID: Regina Richards, female   DOB: 13-Nov-1945, 70 y.o.   MRN: [id number]

## 2016-08-08 NOTE — Evaluation (Signed)
Physical Therapy Evaluation Patient Details Name: Regina Richards MRN: [id number] DOB: 1946/06/19 Today's Date: 08/08/2016   History of Present Illness   70 year old female found to be confused by family on 12/7and  brought her to ED . She was found to be febrile and tachycardic. Lactic elevated at above 6. CT of  abdomen showed 25mm L ureteral stone causing hydronephrosis.  She urgently underwent cytoscopy with bilateral double-J stent placement.   Clinical Impression  The patient is confused, not able to state month and day.he may benefit from post acute rehab unless family support is available. Pt admitted with above diagnosis. Pt currently with functional limitations due to the deficits listed below (see PT Problem List).  Pt will benefit from skilled PT to increase their independence and safety with mobility to allow discharge to the venue listed below.       Follow Up Recommendations SNF;Supervision/Assistance - 24 hour;Home health PT (depends on progress and family support)    Equipment Recommendations   (tbd)    Recommendations for Other Services       Precautions / Restrictions Precautions Precautions: Fall      Mobility  Bed Mobility Overal bed mobility: Needs Assistance Bed Mobility: Supine to Sit     Supine to sit: Mod assist     General bed mobility comments: extra time, cues to scoot  to edge, bed pad used to assist  Transfers Overall transfer level: Needs assistance Equipment used: Rolling walker (2 wheeled) Transfers: Sit to/from [facility] Sit to Stand: Min assist Stand pivot transfers: Min assist       General transfer comment: cues for safety, multimodal cues for following commands for safety. with RW able to take steps to  transfer to recliner. about 5 feet  Ambulation/Gait                Stairs            Wheelchair Mobility    Modified Rankin (Stroke Patients Only)       Balance Overall balance assessment:  Needs assistance Sitting-balance support: Feet supported;No upper extremity supported Sitting balance-Leahy Scale: Fair     Standing balance support: During functional activity;Bilateral upper extremity supported Standing balance-Leahy Scale: Fair                               Pertinent Vitals/Pain Pain Assessment: No/denies pain    Home Living Family/patient expects to be discharged to:: Private residence Living Arrangements: Alone Available Help at Discharge: Family;Available PRN/intermittently Type of Home: House         Home Equipment: None Additional Comments: 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    Prior Function                 Hand Dominance        Extremity/Trunk Assessment   Upper Extremity Assessment: Generalized weakness           Lower Extremity Assessment: RLE deficits/detail;LLE deficits/detail RLE Deficits / Details: noted mottling of the plantar surface, more on right than left LLE Deficits / Details: mottling of skin  Cervical / Trunk Assessment: Normal  Communication      Cognition Arousal/Alertness: Awake/alert Behavior During Therapy: WFL for tasks assessed/performed Overall Cognitive Status: Impaired/Different from baseline Area of Impairment: Following commands Orientation Level: Place;Time;Situation   Memory: Decreased short-term memory Following Commands: Follows one step commands with increased time  Awareness: Emergent        General Comments      Exercises     Assessment/Plan    PT Assessment Patient needs continued PT services  PT Problem List Decreased strength;Decreased activity tolerance;Decreased mobility;Decreased knowledge of precautions;Decreased safety awareness;Decreased knowledge of use of DME;Decreased cognition          PT Treatment Interventions DME instruction;Gait training;Functional mobility training;Therapeutic activities;Therapeutic exercise;Cognitive  remediation;Patient/family education    PT Goals (Current goals can be found in the Care Plan section)  Acute Rehab PT Goals Patient Stated Goal: agreed with OOB PT Goal Formulation: With patient/family Time For Goal Achievement: 08/22/16 Potential to Achieve Goals: Good    Frequency Min 3X/week   Barriers to discharge Decreased caregiver support uncertain if family is availabl;e    Co-evaluation               End of Session Equipment Utilized During Treatment: Gait belt Activity Tolerance: Patient tolerated treatment well Patient left: in chair;with call bell/phone within reach;with chair alarm set;with family/visitor present Nurse Communication: Mobility status         Time: [id number] PT Time Calculation (min) (ACUTE ONLY): 24 min   Charges:   PT Evaluation $PT Eval Low Complexity: 1 Procedure PT Treatments $Therapeutic Activity: 8-22 mins   PT G Codes:        Claretha Cooper 08/08/2016, 11:38 AM Tresa Endo PT [phone]

## 2016-08-08 NOTE — Progress Notes (Addendum)
PROGRESS NOTE                                                                                                                                                                                                             Patient Demographics:    Regina Richards, is a 70 y.o. female, DOB - 1945/09/26, PT:[id number]  Admit date - 08/03/2016   Admitting Physician No admitting provider for patient encounter.  Outpatient Primary MD for the patient is No PCP Per Patient  LOS - 5   Chief Complaint  Patient presents with  . Altered Mental Status       Brief Narrative  70 year old female without significant past medical history, presents with altered mental status and sepsis secondary to infected bilateral obstructive renal stone, in septic shock, required intubation and pressor initially, successfully extubated and weaned off pressor on 12/8, at this bilateral J stent placement by urology, workup significant for acute renal failure, bacteremia and multiple electrolytes abnormalities, care was transferred to triad on 12/10.  Failure continues to improve with IV fluids and after obstruction resolved, patients with significant abdominal distention and bright red blood per rectum 12/11, seen by GI, no acute finding on CT abdomen and pelvis.  Significant events: 12/7 admit, to OR for bilateral ureteral stent placement. ETT 12/8 > 12/8   Subjective:    Regina Richards today Is awake, More coherent today , denies any headache, chest pain, abdominal pain, nausea or vomiting , had 3 bowel movements with blood in stool yesterday, but nothing overnight .    Assessment  & Plan :    Active Problems:   Sepsis ([place])   Septic shock ([place])   Acute kidney injury ([place])   Acute pyelonephritis   Acute hypoxemic respiratory failure ([facility])   Septic shock secondary to infected obstructive uropathy and Escherichia coli bacteremia - Patient presents with sepsis, tachypnea,  tachycardia, fever and leukocytosis, at one point hypotensive requiring pressors, with evidence of end organ damage including acute renal failure and elevated lactic acid. - Currently weaned off pressors, procalcitonin trending down, lactic acid within normal limits. - Continue  with IV fluid - Old transferred to medical floor  Obstructing left ureteral stone and right renal pelvic stone with sepsis - Urology consult appreciated, status post double-J stent placement 12/7. - Renal Functions improving gradually - Urine culture  growing Escherichia coli - Urology signed off, to follow with them as an outpatient  Escherichia coli bacteremia - Continue to above, treated with IV Zosyn, changed to Rocephin giving susceptibilities, will need total of 14 days of antibiotic treatment, antibiotics can be transitioned to by mouth on discharge. - Follow on repeat blood cultures 12/10  Abdominal distention with bright red blood per rectum - Patient  with significant abdomen the abdominal distention on 12/11, new onset, otherwise abdominal exam benign, she denies any complaints, GI input greatly appreciated, no evidence of inflammation or ischemic colitis on CT abdomen and pelvis 12/11, possibly related to early ileus from multiple electrolytes abnormalities, monitor closely and replete aggressively, tolerating full liquid diet.  Acute renal failure - Most likely related to affective uropathy and hypotension, improving with IV fluids, her baseline is unknown  Thrombocytopenia - Baseline is unknown, but this is most likely due to sepsis, no indication for transfusion, continues to improve , continue with SCD for DVT prophylaxis.  Elevated troponins - Denies any chest pain or shortness of breath, this is most likely in the setting of acute renal failure and sepsis, troponin trend  non-ACS pattern.  Hypernatremia - Secondary to volume depletion and dehydration, improving  D5 half-normal  saline  Encephalopathy - Patient with significant confusion, this is most likely due to hypoactive delirium, continue supportive care.  Hypokalemia/hypophosphatemia/hypomagnesemia - Repeating, will continue to monitor closely.     Patient with right IJ CVL, discontinued 12/11  Code Status : Full  Family Communication  : Spoke with sister at bedside  Disposition Plan  : Downgraded to medical floor today, PT consult pending  Consults  :  PCCM, Urology, GI  Procedures  : status post double-J stent placement 12/7  DVT Prophylaxis  : SCDs , no chemical DVT prophylaxis with thrombocytopenia and bright red blood per rectum  Lab Results  Component Value Date   PLT 55 (L) 08/08/2016    Antibiotics  :    Anti-infectives    Start     Dose/Rate Route Frequency Ordered Stop   08/07/16 1200  cefTRIAXone (ROCEPHIN) 2 g in dextrose 5 % 50 mL IVPB     2 g 100 mL/hr over 30 Minutes Intravenous Every 24 hours 08/07/16 1109     08/05/16 1700  vancomycin (VANCOCIN) IVPB 1000 mg/200 mL premix  Status:  Discontinued     1,000 mg 200 mL/hr over 60 Minutes Intravenous Every 48 hours 08/03/16 1947 08/04/16 0035   08/04/16 0000  piperacillin-tazobactam (ZOSYN) IVPB 3.375 g  Status:  Discontinued     3.375 g 12.5 mL/hr over 240 Minutes Intravenous Every 8 hours 08/03/16 1947 08/07/16 1109   08/03/16 1730  piperacillin-tazobactam (ZOSYN) IVPB 3.375 g     3.375 g 100 mL/hr over 30 Minutes Intravenous  Once 08/03/16 1729 08/03/16 1815   08/03/16 1730  vancomycin (VANCOCIN) IVPB 1000 mg/200 mL premix     1,000 mg 200 mL/hr over 60 Minutes Intravenous  Once 08/03/16 1729 08/03/16 2118        Objective:   Vitals:   08/08/16 0500 08/08/16 0600 08/08/16 0700 08/08/16 0800  BP: (!) 171/77 (!) 170/86 (!) 157/83   Pulse: 83 81 80   Resp: 16 18 16    Temp:    98.2 F (36.8 C)  TempSrc:    Axillary  SpO2: 100% 100% 99%   Weight:  83.2 kg (183 lb 6.8 oz)    Height:        Wt Readings  from  Last 3 Encounters:  08/08/16 83.2 kg (183 lb 6.8 oz)     Intake/Output Summary (Last 24 hours) at 08/08/16 1116 Last data filed at 08/08/16 0700  Gross per 24 hour  Intake             2650 ml  Output             3450 ml  Net             -800 ml     Physical Exam  Awake Alert,Much more coherent and appropriate today Supple Neck,No JVD, right IJ CVL  Symmetrical Chest wall movement, Good air movement bilaterally, CTAB RRR,No Gallops,Rubs or new Murmurs, No Parasternal Heave +ve B.Sounds,Abdomen significantly distended, but benign on exam, no tenderness, guarding or rigidity. No Cyanosis, Clubbing or edema, No new Rash or bruise     Data Review:    CBC  Recent Labs Lab 08/03/16 1655  08/05/16 0555 08/05/16 1730 08/06/16 0548 08/07/16 0348 08/08/16 0426  WBC 5.9  < > 13.1* 14.0* 11.2* 13.2* 13.1*  HGB 10.8*  < > 9.0* 9.2* 8.8* 8.9* 10.7*  HCT 33.8*  < > 28.9* 29.4* 28.4* 28.7* 33.9*  PLT 41*  < > 32* 29* 36* 42* 55*  MCV 78.4  < > 79.6 79.2 79.8 76.9* 80.1  MCH 25.1*  < > 24.8* 24.8* 24.7* 23.9* 25.3*  MCHC 32.0  < > 31.1 31.3 31.0 31.0 31.6  RDW 17.2*  < > 17.5* 17.4* 17.7* 17.6* 17.9*  LYMPHSABS 0.3*  --   --  0.8 0.7  --   --   MONOABS 0.1  --   --  1.2* 1.4*  --   --   EOSABS 0.0  --   --  0.0 0.0  --   --   BASOSABS 0.0  --   --  0.0 0.0  --   --   < > = values in this interval not displayed.  Chemistries   Recent Labs Lab 08/03/16 1655  08/04/16 0201 08/04/16 1204 08/05/16 0555 08/05/16 1143 08/06/16 0059 08/06/16 0548 08/07/16 0348 08/08/16 0426  NA 134*  --  140 140 144  --  149*  --  143 141  K 3.1*  --  3.5 4.2 3.9  --  3.6  --  2.9* 3.9  CL 100*  --  108 109 113*  --  120*  --  116* 111  CO2 17*  --  21* 23 24  --  23  --  20* 22  GLUCOSE 127*  --  87 102* 96  --  110*  --  87 100*  BUN 55*  --  54* 55* 56*  --  51*  --  42* 30*  CREATININE 3.89*  --  3.06* 2.54* 2.26*  --  1.95*  --  1.58* 1.38*  CALCIUM 7.7*  --  7.1* 7.3* 7.2*  --  7.7*   --  7.4* 7.9*  MG  --   < > 1.6* 3.0*  --  2.6*  --  2.5* 2.0 2.0  AST 112*  --  96*  --   --   --   --  60*  --   --   ALT 70*  --  63*  --   --   --   --  57*  --   --   ALKPHOS 188*  --  92  --   --   --   --  127*  --   --   BILITOT 0.8  --  0.9  --   --   --   --  1.1  --   --   < > = values in this interval not displayed. ------------------------------------------------------------------------------------------------------------------ No results for input(s): CHOL, HDL, LDLCALC, TRIG, CHOLHDL, LDLDIRECT in the last 72 hours.  No results found for: HGBA1C ------------------------------------------------------------------------------------------------------------------ No results for input(s): TSH, T4TOTAL, T3FREE, THYROIDAB in the last 72 hours.  Invalid input(s): FREET3 ------------------------------------------------------------------------------------------------------------------ No results for input(s): VITAMINB12, FOLATE, FERRITIN, TIBC, IRON, RETICCTPCT in the last 72 hours.  Coagulation profile  Recent Labs Lab 08/03/16 1655 08/05/16 1730  INR 1.64 1.20    No results for input(s): DDIMER in the last 72 hours.  Cardiac Enzymes  Recent Labs Lab 08/06/16 1040 08/06/16 1654 08/06/16 2039  TROPONINI 0.04* 0.04* 0.04*   ------------------------------------------------------------------------------------------------------------------ No results found for: BNP  Inpatient Medications  Scheduled Meds: . amLODipine  10 mg Oral Daily  . bisacodyl  10 mg Rectal BID  . cefTRIAXone (ROCEPHIN)  IV  2 g Intravenous Q24H  . famotidine  20 mg Oral Daily  . mouth rinse  15 mL Mouth Rinse QID  . PARoxetine  40 mg Oral Daily  . simethicone  40 mg Oral QID  . sodium phosphate  1 enema Rectal Once   Continuous Infusions: . dextrose 5 % and 0.45% NaCl 100 mL/hr at 08/08/16 0100   PRN Meds:.bisacodyl, haloperidol lactate, hydrALAZINE, lip balm  Micro Results Recent  Results (from the past 240 hour(s))  Urine culture     Status: Abnormal   Collection Time: 08/03/16  5:10 PM  Result Value Ref Range Status   Specimen Description URINE, RANDOM  Final   Special Requests NONE  Final   Culture >=100,000 COLONIES/mL ESCHERICHIA COLI (A)  Final   Report Status 08/05/2016 FINAL  Final   Organism ID, Bacteria ESCHERICHIA COLI (A)  Final      Susceptibility   Escherichia coli - MIC*    AMPICILLIN >=32 RESISTANT Resistant     CEFAZOLIN <=4 SENSITIVE Sensitive     CEFTRIAXONE <=1 SENSITIVE Sensitive     CIPROFLOXACIN <=0.25 SENSITIVE Sensitive     GENTAMICIN <=1 SENSITIVE Sensitive     IMIPENEM <=0.25 SENSITIVE Sensitive     NITROFURANTOIN <=16 SENSITIVE Sensitive     TRIMETH/SULFA <=20 SENSITIVE Sensitive     AMPICILLIN/SULBACTAM 16 INTERMEDIATE Intermediate     PIP/TAZO <=4 SENSITIVE Sensitive     Extended ESBL NEGATIVE Sensitive     * >=100,000 COLONIES/mL ESCHERICHIA COLI  Culture, blood (Routine x 2)     Status: Abnormal   Collection Time: 08/03/16  5:20 PM  Result Value Ref Range Status   Specimen Description BLOOD RIGHT ANTECUBITAL  Final   Special Requests BOTTLES DRAWN AEROBIC AND ANAEROBIC 5CC  Final   Culture  Setup Time   Final    GRAM NEGATIVE RODS IN BOTH AEROBIC AND ANAEROBIC BOTTLES CRITICAL VALUE NOTED.  VALUE IS CONSISTENT WITH PREVIOUSLY REPORTED AND CALLED VALUE.    Culture (A)  Final    ESCHERICHIA COLI SUSCEPTIBILITIES PERFORMED ON PREVIOUS CULTURE WITHIN THE LAST 5 DAYS.    Report Status 08/07/2016 FINAL  Final  Culture, blood (Routine x 2)     Status: Abnormal   Collection Time: 08/03/16  5:30 PM  Result Value Ref Range Status   Specimen Description BLOOD LEFT WRIST  Final   Special Requests BOTTLES DRAWN AEROBIC AND ANAEROBIC 5CC  Final   Culture  Setup Time   Final    GRAM NEGATIVE RODS IN BOTH AEROBIC AND ANAEROBIC BOTTLES CRITICAL RESULT CALLED TO, READ BACK BY AND VERIFIED WITH: Sheffield Slider, [place] AT [id number] ON [id number]  BY Rhea Bleacher    Culture ESCHERICHIA COLI (A)  Final   Report Status 08/07/2016 FINAL  Final   Organism ID, Bacteria ESCHERICHIA COLI  Final      Susceptibility   Escherichia coli - MIC*    AMPICILLIN >=32 RESISTANT Resistant     CEFAZOLIN <=4 SENSITIVE Sensitive     CEFEPIME <=1 SENSITIVE Sensitive     CEFTAZIDIME <=1 SENSITIVE Sensitive     CEFTRIAXONE <=1 SENSITIVE Sensitive     CIPROFLOXACIN <=0.25 SENSITIVE Sensitive     GENTAMICIN <=1 SENSITIVE Sensitive     IMIPENEM <=0.25 SENSITIVE Sensitive     TRIMETH/SULFA <=20 SENSITIVE Sensitive     AMPICILLIN/SULBACTAM 16 INTERMEDIATE Intermediate     PIP/TAZO <=4 SENSITIVE Sensitive     Extended ESBL NEGATIVE Sensitive     * ESCHERICHIA COLI  Blood Culture ID Panel (Reflexed)     Status: Abnormal   Collection Time: 08/03/16  5:30 PM  Result Value Ref Range Status   Enterococcus species NOT DETECTED NOT DETECTED Final   Listeria monocytogenes NOT DETECTED NOT DETECTED Final   Staphylococcus species NOT DETECTED NOT DETECTED Final   Staphylococcus aureus NOT DETECTED NOT DETECTED Final   Streptococcus species NOT DETECTED NOT DETECTED Final   Streptococcus agalactiae NOT DETECTED NOT DETECTED Final   Streptococcus pneumoniae NOT DETECTED NOT DETECTED Final   Streptococcus pyogenes NOT DETECTED NOT DETECTED Final   Acinetobacter baumannii NOT DETECTED NOT DETECTED Final   Enterobacteriaceae species DETECTED (A) NOT DETECTED Final    Comment: CRITICAL RESULT CALLED TO, READ BACK BY AND VERIFIED WITH: Sheffield Slider, [place] AT [id number] ON [id number] BY S. YARBROUGH    Enterobacter cloacae complex NOT DETECTED NOT DETECTED Final   Escherichia coli DETECTED (A) NOT DETECTED Final    Comment: CRITICAL RESULT CALLED TO, READ BACK BY AND VERIFIED WITH: Sheffield Slider, [place] AT [id number] ON [id number] BY S. YARBROUGH    Klebsiella oxytoca NOT DETECTED NOT DETECTED Final   Klebsiella pneumoniae NOT DETECTED NOT DETECTED Final   Proteus species NOT DETECTED  NOT DETECTED Final   Serratia marcescens NOT DETECTED NOT DETECTED Final   Carbapenem resistance NOT DETECTED NOT DETECTED Final   Haemophilus influenzae NOT DETECTED NOT DETECTED Final   Neisseria meningitidis NOT DETECTED NOT DETECTED Final   Pseudomonas aeruginosa NOT DETECTED NOT DETECTED Final   Candida albicans NOT DETECTED NOT DETECTED Final   Candida glabrata NOT DETECTED NOT DETECTED Final   Candida krusei NOT DETECTED NOT DETECTED Final   Candida parapsilosis NOT DETECTED NOT DETECTED Final   Candida tropicalis NOT DETECTED NOT DETECTED Final  MRSA PCR Screening     Status: None   Collection Time: 08/04/16 12:30 AM  Result Value Ref Range Status   MRSA by PCR NEGATIVE NEGATIVE Final    Comment:        The GeneXpert MRSA Assay (FDA approved for NASAL specimens only), is one component of a comprehensive MRSA colonization surveillance program. It is not intended to diagnose MRSA infection nor to guide or monitor treatment for MRSA infections.   Culture, blood (Routine X 2) w Reflex to ID Panel     Status: None (Preliminary result)   Collection Time: 08/06/16 10:40 AM  Result Value Ref Range Status   Specimen Description BLOOD LEFT WRIST  10 ML IN AEROBIC ONLY  Final   Special Requests NONE  Final   Culture   Final    NO GROWTH < 24 HOURS Performed at [facility]    Report Status PENDING  Incomplete  Culture, blood (Routine X 2) w Reflex to ID Panel     Status: None (Preliminary result)   Collection Time: 08/06/16 10:40 AM  Result Value Ref Range Status   Specimen Description BLOOD RIGHT ARM  5 ML IN AEROBIC ONLY  Final   Special Requests NONE  Final   Culture   Final    NO GROWTH < 24 HOURS Performed at [facility]    Report Status PENDING  Incomplete    Radiology Reports Ct Abdomen Pelvis Wo Contrast  Result Date: 08/07/2016 CLINICAL DATA:  Sepsis in new onset of bloody stools and abdominal distention EXAM: CT ABDOMEN AND PELVIS WITHOUT  CONTRAST TECHNIQUE: Multidetector CT imaging of the abdomen and pelvis was performed following the standard protocol without IV contrast. COMPARISON:  08/03/2016 FINDINGS: Lower chest: Small bilateral pleural effusions are noted as well as bibasilar atelectasis. Hepatobiliary: The liver and gallbladder appear within normal limits. Pancreas: Unremarkable. No pancreatic ductal dilatation or surrounding inflammatory changes. Spleen: Normal in size without focal abnormality. Adrenals/Urinary Tract: Bilateral ureteral stents are now seen. The previously seen proximal left ureteral stone is again identified. The previously noted right renal pelvic stone is again identified. Additionally a second renal stone is noted on the right which was not as well appreciated on the prior exam. It lies in the interpolar region posteriorly. Bladder is decompressed by Foley catheter. Stomach/Bowel: No obstructive changes are seen. The appendix is not well visualized. No inflammatory changes are seen. Vascular/Lymphatic: No significant vascular findings are present. No enlarged abdominal or pelvic lymph nodes. Reproductive: Uterus and bilateral adnexa are unremarkable. Other: New ascites is seen of a mild degree when compared with the prior exam. Mild changes of anasarca are noted as well Musculoskeletal: Stable chronic scoliosis of the lumbar spine is seen. IMPRESSION: New ascites when compared with the prior study. Bilateral pleural effusions and bibasilar atelectasis. Bilateral ureteral stents with calculi as described. Electronically Signed   By: Inez Catalina M.D.   On: 08/07/2016 18:08   Ct Abdomen Pelvis Wo Contrast  Result Date: 08/03/2016 CLINICAL DATA:  Sepsis with nausea, vomiting and diarrhea times 1-2 days. EXAM: CT ABDOMEN AND PELVIS WITHOUT CONTRAST TECHNIQUE: Multidetector CT imaging of the abdomen and pelvis was performed following the standard protocol without IV contrast. COMPARISON:  CXR from the same day FINDINGS:  Lower chest: The visualized heart is within normal limits for size. There is no pericardial effusion. There is coronary arteriosclerosis as well as calcifications at the level of the aortic root. Trace bilateral pleural effusions. There is mild interstitial edema. There is bibasilar atelectasis with more confluent patchy airspace opacities in both lower lobes. Pneumonia is not excluded. Hepatobiliary: The unenhanced liver is unremarkable. The gallbladder is distended without calculi or wall thickening. No biliary dilatation. Pancreas: Unremarkable appearance of the pancreas. Spleen: No splenomegaly. Adrenals/Urinary Tract: There is a 4 mm left proximal ureteral stone causing mild to moderate left-sided hydronephrosis. There is an 8 x 9 mm nonobstructing right renal pelvic stone with extrarenal pelvis. Bilateral nephrolithiasis is noted. There are at least 3-4 punctate calcifications within the bladder which likely reflect previously passed stones. Bilateral perinephric fat stranding is noted. Cannot exclude urinary tract infection and pyelonephritis given lack of IV contrast. Stomach/Bowel: Normal  bowel rotation. No small bowel dilatation. Moderate stool burden in the rectosigmoid. There is mild transmural thickening of the sigmoid and colitis is not entirely exclude. Vascular/Lymphatic: The abdominal aorta is tortuous and partially calcified without aneurysm. Atherosclerosis of the left common iliac artery is also seen extending into the internal iliac. No pathologically enlarged lymph nodes. Reproductive: The uterus and adnexa are unremarkable. Other: Mild mesenteric fatty edema. Musculoskeletal: There is dextroconvex curvature of the lumbar spine with spondylosis. No acute osseous abnormality nor suspicious osseous abnormalities. IMPRESSION: 1. Bilateral nephrolithiasis with 4 mm left proximal ureteral stone causing mild-to-moderate hydroureteronephrosis. There is an 8 x 9 mm nonobstructing right renal pelvic  calculus within extrarenal pelvis. Bilateral perinephric fat stranding is seen. Cannot exclude urinary tract infection or pyelonephritis with these findings and in the absence of IV contrast. 2. Bilateral nephrolithiasis and tiny bladder calculi. 3. There is bibasilar patchy atelectasis with trace bilateral pleural effusions. More confluent areas of airspace opacity in both lower lobes cannot exclude pneumonia. 4. Mild transmural thickening of sigmoid colon. Findings are suspicious for colitis. Electronically Signed   By: Ashley Royalty M.D.   On: 08/03/2016 20:05   Ct Head Wo Contrast  Result Date: 08/03/2016 CLINICAL DATA:  70 year old female with confusion and altered mental status. Suspected sepsis. Initial encounter. EXAM: CT HEAD WITHOUT CONTRAST TECHNIQUE: Contiguous axial images were obtained from the base of the skull through the vertex without intravenous contrast. COMPARISON:  None. FINDINGS: Brain: Chronic encephalomalacia in the left frontal lobe along the anterior left frontal operculum. Mild ex vacuo enlargement of the adjacent left lateral ventricle. Mild superimposed other white matter hypodensity, mostly along the frontal horns. No other cortical encephalomalacia identified. No midline shift, ventriculomegaly, mass effect, evidence of mass lesion, intracranial hemorrhage or evidence of cortically based acute infarction. Vascular: No suspicious intracranial vascular hyperdensity. Skull: No acute osseous abnormality identified. Sinuses/Orbits: Mild mucosal thickening right maxillary sinus. Trace fluid level right sphenoid sinus. Other Visualized paranasal sinuses and mastoids are well pneumatized. Other: Negative orbit and scalp soft tissues. IMPRESSION: 1.  No acute intracranial abnormality. 2. Chronic anterior division left MCA infarct. 3. Minimal right maxillary and sphenoid sinus inflammation, significance doubtful. Electronically Signed   By: Genevie Ann M.D.   On: 08/03/2016 18:34   Dg Chest  Port 1 View  Result Date: 08/05/2016 CLINICAL DATA:  Respiratory failure EXAM: PORTABLE CHEST 1 VIEW COMPARISON:  08/04/2016 FINDINGS: Endotracheal tube is been removed. Central venous catheter tip remains in the SVC. NG removed. Hypoventilation with bibasilar atelectasis unchanged from the prior study. Decrease in vascular congestion since prior study. IMPRESSION: Hypoventilation with bibasilar atelectasis unchanged. Decrease in vascular congestion. Endotracheal tube and NG tube have been removed. Central venous catheter tip remains in the SVC. Electronically Signed   By: Franchot Gallo M.D.   On: 08/05/2016 06:58   Dg Chest Port 1 View  Result Date: 08/04/2016 CLINICAL DATA:  Central line placement EXAM: PORTABLE CHEST 1 VIEW COMPARISON:  08/04/2016 at 00:17 FINDINGS: Right jugular central line has been placed. Tip is in the expected location of the SVC just below the azygos vein junction. Endotracheal tube tip is 3.6 cm above the carina. Nasogastric tube extends into the stomach and beyond the inferior edge of the image. Dense consolidation persists in the left base with milder curvilinear opacity in the right base, unchanged. No pneumothorax. IMPRESSION: 1. New right jugular central line.  No pneumothorax. 2. ET and NG tubes appear satisfactorily positioned. 3. No significant interval change in  the bilateral airspace opacities. Electronically Signed   By: Andreas Newport M.D.   On: 08/04/2016 01:59   Dg Chest Port 1 View  Result Date: 08/04/2016 CLINICAL DATA:  Encephalopathy.  Intubated. EXAM: PORTABLE CHEST 1 VIEW COMPARISON:  08/03/2016 FINDINGS: Endotracheal tube is 4.6 cm above the carina. Nasogastric tube extends into the stomach and beyond the inferior edge of the image. Airspace opacities are present in both bases, worsened on the left were there now was more confluent consolidation. Probable small left pleural effusion. No pneumothorax. IMPRESSION: 1.  Support equipment appears  satisfactorily positioned. 2. Worsening airspace opacity in the left base. Electronically Signed   By: Andreas Newport M.D.   On: 08/04/2016 00:59   Dg Chest Portable 1 View  Result Date: 08/03/2016 CLINICAL DATA:  Altered mental status EXAM: PORTABLE CHEST 1 VIEW COMPARISON:  None. FINDINGS: Low volume chest with streaky basilar opacities. No edema, effusion, or pneumothorax. Normal heart size and mediastinal contours when accounting for leftward rotation. IMPRESSION: Low volume chest with basilar atelectasis. Electronically Signed   By: Monte Fantasia M.D.   On: 08/03/2016 17:25   Dg Abd Portable 1v  Result Date: 08/07/2016 CLINICAL DATA:  Abdominal distension. EXAM: PORTABLE ABDOMEN - 1 VIEW COMPARISON:  08/04/2016 and CT abdomen pelvis 08/03/2016. FINDINGS: Mild nonspecific gaseous distention of small bowel and colon. There are bilateral ureteral stents in place. Nasogastric tube has been removed. Dextroconvex scoliosis, centered in the mid lumbar spine. IMPRESSION: Mild nonspecific gaseous distention of small bowel and colon. Electronically Signed   By: Lorin Picket M.D.   On: 08/07/2016 07:07   Dg Abd Portable 1v  Result Date: 08/04/2016 CLINICAL DATA:  Abdominal distension. EXAM: PORTABLE ABDOMEN - 1 VIEW COMPARISON:  CT scan of August 03, 2016. FINDINGS: The bowel gas pattern is normal. Distal tip of nasogastric tube is seen in expected position of distal stomach. Bilateral ureteral stents are noted. Right renal calculi are noted as described on prior CT scan. IMPRESSION: Right nephrolithiasis. Bilateral ureteral stents are noted. No evidence of bowel obstruction or ileus. Electronically Signed   By: Marijo Conception, M.D.   On: 08/04/2016 08:35      Mikaella Escalona M.D on 08/08/2016 at 11:16 AM  Between 7am to 7pm - Pager - [phone]  After 7pm go to [email or web address] - password [facility]  [facility] -  Office  [phone]

## 2016-08-09 DIAGNOSIS — R7881 Bacteremia: Secondary | ICD-10-CM | POA: Diagnosis not present

## 2016-08-09 DIAGNOSIS — K625 Hemorrhage of anus and rectum: Secondary | ICD-10-CM | POA: Diagnosis not present

## 2016-08-09 DIAGNOSIS — N179 Acute kidney failure, unspecified: Secondary | ICD-10-CM

## 2016-08-09 DIAGNOSIS — N12 Tubulo-interstitial nephritis, not specified as acute or chronic: Secondary | ICD-10-CM | POA: Diagnosis not present

## 2016-08-09 DIAGNOSIS — J9601 Acute respiratory failure with hypoxia: Secondary | ICD-10-CM | POA: Diagnosis not present

## 2016-08-09 DIAGNOSIS — F329 Major depressive disorder, single episode, unspecified: Secondary | ICD-10-CM | POA: Diagnosis not present

## 2016-08-09 DIAGNOSIS — F331 Major depressive disorder, recurrent, moderate: Secondary | ICD-10-CM | POA: Diagnosis not present

## 2016-08-09 DIAGNOSIS — N10 Acute tubulo-interstitial nephritis: Secondary | ICD-10-CM | POA: Diagnosis not present

## 2016-08-09 DIAGNOSIS — R6521 Severe sepsis with septic shock: Secondary | ICD-10-CM | POA: Diagnosis not present

## 2016-08-09 DIAGNOSIS — A4181 Sepsis due to Enterococcus: Secondary | ICD-10-CM | POA: Diagnosis not present

## 2016-08-09 DIAGNOSIS — A419 Sepsis, unspecified organism: Secondary | ICD-10-CM | POA: Diagnosis not present

## 2016-08-09 DIAGNOSIS — G9341 Metabolic encephalopathy: Secondary | ICD-10-CM | POA: Diagnosis present

## 2016-08-09 DIAGNOSIS — D6949 Other primary thrombocytopenia: Secondary | ICD-10-CM | POA: Diagnosis not present

## 2016-08-09 DIAGNOSIS — R6520 Severe sepsis without septic shock: Secondary | ICD-10-CM | POA: Diagnosis not present

## 2016-08-09 DIAGNOSIS — N132 Hydronephrosis with renal and ureteral calculous obstruction: Secondary | ICD-10-CM

## 2016-08-09 DIAGNOSIS — R030 Elevated blood-pressure reading, without diagnosis of hypertension: Secondary | ICD-10-CM | POA: Diagnosis present

## 2016-08-09 DIAGNOSIS — N202 Calculus of kidney with calculus of ureter: Secondary | ICD-10-CM | POA: Diagnosis not present

## 2016-08-09 DIAGNOSIS — I10 Essential (primary) hypertension: Secondary | ICD-10-CM | POA: Diagnosis not present

## 2016-08-09 DIAGNOSIS — E43 Unspecified severe protein-calorie malnutrition: Secondary | ICD-10-CM | POA: Diagnosis not present

## 2016-08-09 DIAGNOSIS — N136 Pyonephrosis: Secondary | ICD-10-CM | POA: Diagnosis not present

## 2016-08-09 DIAGNOSIS — R2689 Other abnormalities of gait and mobility: Secondary | ICD-10-CM | POA: Diagnosis not present

## 2016-08-09 DIAGNOSIS — J9600 Acute respiratory failure, unspecified whether with hypoxia or hypercapnia: Secondary | ICD-10-CM | POA: Diagnosis not present

## 2016-08-09 DIAGNOSIS — R945 Abnormal results of liver function studies: Secondary | ICD-10-CM | POA: Diagnosis not present

## 2016-08-09 DIAGNOSIS — K922 Gastrointestinal hemorrhage, unspecified: Secondary | ICD-10-CM | POA: Diagnosis not present

## 2016-08-09 DIAGNOSIS — E876 Hypokalemia: Secondary | ICD-10-CM

## 2016-08-09 DIAGNOSIS — N200 Calculus of kidney: Secondary | ICD-10-CM | POA: Diagnosis not present

## 2016-08-09 DIAGNOSIS — D6489 Other specified anemias: Secondary | ICD-10-CM | POA: Diagnosis not present

## 2016-08-09 DIAGNOSIS — A4151 Sepsis due to Escherichia coli [E. coli]: Secondary | ICD-10-CM | POA: Diagnosis present

## 2016-08-09 DIAGNOSIS — M6281 Muscle weakness (generalized): Secondary | ICD-10-CM | POA: Diagnosis not present

## 2016-08-09 DIAGNOSIS — N201 Calculus of ureter: Secondary | ICD-10-CM | POA: Diagnosis not present

## 2016-08-09 DIAGNOSIS — J9610 Chronic respiratory failure, unspecified whether with hypoxia or hypercapnia: Secondary | ICD-10-CM | POA: Diagnosis not present

## 2016-08-09 LAB — CBC
HCT: 28.8 % — ABNORMAL LOW (ref 36.0–46.0)
HEMOGLOBIN: 9.1 g/dL — AB (ref 12.0–15.0)
MCH: 25.1 pg — ABNORMAL LOW (ref 26.0–34.0)
MCHC: 31.6 g/dL (ref 30.0–36.0)
MCV: 79.3 fL (ref 78.0–100.0)
Platelets: 79 10*3/uL — ABNORMAL LOW (ref 150–400)
RBC: 3.63 MIL/uL — AB (ref 3.87–5.11)
RDW: 17.6 % — ABNORMAL HIGH (ref 11.5–15.5)
WBC: 15 10*3/uL — ABNORMAL HIGH (ref 4.0–10.5)

## 2016-08-09 LAB — BASIC METABOLIC PANEL WITH GFR
Anion gap: 5 (ref 5–15)
BUN: 20 mg/dL (ref 6–20)
CO2: 25 mmol/L (ref 22–32)
Calcium: 7.7 mg/dL — ABNORMAL LOW (ref 8.9–10.3)
Chloride: 108 mmol/L (ref 101–111)
Creatinine, Ser: 1 mg/dL (ref 0.44–1.00)
GFR calc Af Amer: >60 mL/min
GFR calc non Af Amer: 56 mL/min — ABNORMAL LOW
Glucose, Bld: 123 mg/dL — ABNORMAL HIGH (ref 65–99)
Potassium: 3.4 mmol/L — ABNORMAL LOW (ref 3.5–5.1)
Sodium: 138 mmol/L (ref 135–145)

## 2016-08-09 LAB — MAGNESIUM: MAGNESIUM: 1.8 mg/dL (ref 1.7–2.4)

## 2016-08-09 LAB — PHOSPHORUS: PHOSPHORUS: 2.3 mg/dL — AB (ref 2.5–4.6)

## 2016-08-09 MED ORDER — AMLODIPINE BESYLATE 10 MG PO TABS: 10.0000 mg | ORAL_TABLET | Freq: Every day | ORAL | 0 refills | Status: DC

## 2016-08-09 MED ORDER — CEFPODOXIME PROXETIL 200 MG PO TABS: 200.0000 mg | ORAL_TABLET | Freq: Two times a day (BID) | ORAL | 0 refills | Status: AC

## 2016-08-09 MED ORDER — BISACODYL 10 MG RE SUPP: 10.0000 mg | Freq: Every day | RECTAL | 0 refills | Status: DC | PRN

## 2016-08-09 NOTE — Discharge Summary (Signed)
Physician Discharge Summary  Regina Richards [id number] DOB: 1946-08-06 DOA: 08/03/2016  PCP: No PCP Per Patient  Admit date: 08/03/2016 Discharge date: 08/09/2016  Admitted From: Home Disposition:  SNF  Recommendations for Outpatient Follow-up:  1. Follow-up with M.D. at SNF in 1 week. Patient will complete 2 weeks course of antibiotics on 12/21. 2. Patient to follow-up with urologist Dr. Karsten Ro in 2-3 weeks 3. Follow-up with Dr. Michail Sermon in 4-6 weeks.   Equipment/Devices: As per PT at SNF  Discharge Condition: Fair CODE STATUS: Full code Diet recommendation: 2 g sodium    Discharge Diagnoses:  Principal Problem:   Septic shock ([place])   Active Problems:   Acute kidney injury ([place])   Acute pyelonephritis   Acute hypoxemic respiratory failure ([place])   Sepsis due to Escherichia coli with acute renal failure ([place])   Bacteremia due to Escherichia coli   Essential hypertension, benign   Ureteral stone with hydronephrosis   Septic encephalopathy   Hypokalemia   Hypomagnesemia   Hypophosphatemia   Abdominal distention with bright red blood per rectum.  Brief narrative/history of present illness Please refer to admission H&P for details, in brief, 70 year old female with no past medical history presented with acute encephalopathy with septic shock secondary to infected bilateral obstructive renal stone required intubation and monitored in ICU on pressors. Patient was successfully extubated and weaned off pressors on 12/8 and underwent bilateral J stent placement by urology. Patient was found to have significant acute kidney injury, Escherichia coli bacteremia and multiple electrolyte abnormalities.   Hospital course Septic shock secondary to infected obstructive uropathy. Escherichia coli bacteremia. Required intubation and pressors with end organ damage including acute kidney injury and lactic acidosis. -Placed on empiric antibiotics, weaned off pressors and successfully  extubated. -Renal function normalized with IV hydration and double ureteral stent placement. -Antibiotics transitioned to IV Rocephin based on sensitivity. Will discharge on oral Vantin to complete total 2 weeks course.  Obstructive left ureteral stone and right renal pelvic stone with sepsis. Seen by urology and underwent double-J stent placement on 12/7. Off Foley. Renal function improved to normal. Urine culture growing Escherichia coli with bacteremia. Antibiotic course as above.  Abdominal distention with bright red blood per rectum New onset noted on 12/11. CT abdomen and pelvis done without signs of inflammation or ischemic colitis. Suspected to be related to early ileus with multiple ileal right abnormalities. -Seen by GI and no further recommendations except for outpatient follow-up with plan on colonoscopy. -Symptoms resolved with hydration and electrolytes corrected and added bowel regimen.  Acute kidney injury Combination of hypovolemia with sepsis and obstructive uropathy. Now resolved.  Elevated troponin Suspected dementia ischemia with sepsis. No chest pain symptoms or EKG changes.  Hypernatremia  secondary to dehydration. Improved with fluids.  Acute septic encephalopathy Resolved   Hypokalemia/hypomagnesemia/hypophosphatemia  Repleted  Essential hypertension Blood pressure persistently elevated. Added amlodipine. Monitor as outpatient.  Generalized weakness Seen by PT and recommends SNF. Patient lives alone and needs a lot of help at home.  transminitis Mild. Started due to sepsis. Improving. Follow-up as outpatient.  Family communication: Sister is at bedside  Consults: [facility] Urology (Dr. Karsten Ro) [facility] (schooler)  Discharge Instructions     Medication List    TAKE these medications   amLODipine 10 MG tablet Commonly known as:  NORVASC Take 1 tablet (10 mg total) by mouth daily. Start taking on:  08/10/2016   bisacodyl 10 MG  suppository Commonly known as:  DULCOLAX Place 1 suppository (10  mg total) rectally daily as needed for moderate constipation.   cefpodoxime 200 MG tablet Commonly known as:  VANTIN Take 1 tablet (200 mg total) by mouth 2 (two) times daily.   PARoxetine 40 MG tablet Commonly known as:  PAXIL Take 40 mg by mouth daily.      Follow-up Information    Call Claybon Jabs, MD.   Specialty:  Urology Why:  For an appointment in 1-2 weeks when you get home. Contact information: [place] [address] [phone]        MD at SNF in 1 week Follow up.          No Known Allergies   Procedures/Studies: Ct Abdomen Pelvis Wo Contrast  Result Date: 08/07/2016 CLINICAL DATA:  Sepsis in new onset of bloody stools and abdominal distention EXAM: CT ABDOMEN AND PELVIS WITHOUT CONTRAST TECHNIQUE: Multidetector CT imaging of the abdomen and pelvis was performed following the standard protocol without IV contrast. COMPARISON:  08/03/2016 FINDINGS: Lower chest: Small bilateral pleural effusions are noted as well as bibasilar atelectasis. Hepatobiliary: The liver and gallbladder appear within normal limits. Pancreas: Unremarkable. No pancreatic ductal dilatation or surrounding inflammatory changes. Spleen: Normal in size without focal abnormality. Adrenals/Urinary Tract: Bilateral ureteral stents are now seen. The previously seen proximal left ureteral stone is again identified. The previously noted right renal pelvic stone is again identified. Additionally a second renal stone is noted on the right which was not as well appreciated on the prior exam. It lies in the interpolar region posteriorly. Bladder is decompressed by Foley catheter. Stomach/Bowel: No obstructive changes are seen. The appendix is not well visualized. No inflammatory changes are seen. Vascular/Lymphatic: No significant vascular findings are present. No enlarged abdominal or pelvic lymph nodes. Reproductive: Uterus and  bilateral adnexa are unremarkable. Other: New ascites is seen of a mild degree when compared with the prior exam. Mild changes of anasarca are noted as well Musculoskeletal: Stable chronic scoliosis of the lumbar spine is seen. IMPRESSION: New ascites when compared with the prior study. Bilateral pleural effusions and bibasilar atelectasis. Bilateral ureteral stents with calculi as described. Electronically Signed   By: Inez Catalina M.D.   On: 08/07/2016 18:08   Ct Abdomen Pelvis Wo Contrast  Result Date: 08/03/2016 CLINICAL DATA:  Sepsis with nausea, vomiting and diarrhea times 1-2 days. EXAM: CT ABDOMEN AND PELVIS WITHOUT CONTRAST TECHNIQUE: Multidetector CT imaging of the abdomen and pelvis was performed following the standard protocol without IV contrast. COMPARISON:  CXR from the same day FINDINGS: Lower chest: The visualized heart is within normal limits for size. There is no pericardial effusion. There is coronary arteriosclerosis as well as calcifications at the level of the aortic root. Trace bilateral pleural effusions. There is mild interstitial edema. There is bibasilar atelectasis with more confluent patchy airspace opacities in both lower lobes. Pneumonia is not excluded. Hepatobiliary: The unenhanced liver is unremarkable. The gallbladder is distended without calculi or wall thickening. No biliary dilatation. Pancreas: Unremarkable appearance of the pancreas. Spleen: No splenomegaly. Adrenals/Urinary Tract: There is a 4 mm left proximal ureteral stone causing mild to moderate left-sided hydronephrosis. There is an 8 x 9 mm nonobstructing right renal pelvic stone with extrarenal pelvis. Bilateral nephrolithiasis is noted. There are at least 3-4 punctate calcifications within the bladder which likely reflect previously passed stones. Bilateral perinephric fat stranding is noted. Cannot exclude urinary tract infection and pyelonephritis given lack of IV contrast. Stomach/Bowel: Normal bowel rotation.  No small bowel dilatation. Moderate  stool burden in the rectosigmoid. There is mild transmural thickening of the sigmoid and colitis is not entirely exclude. Vascular/Lymphatic: The abdominal aorta is tortuous and partially calcified without aneurysm. Atherosclerosis of the left common iliac artery is also seen extending into the internal iliac. No pathologically enlarged lymph nodes. Reproductive: The uterus and adnexa are unremarkable. Other: Mild mesenteric fatty edema. Musculoskeletal: There is dextroconvex curvature of the lumbar spine with spondylosis. No acute osseous abnormality nor suspicious osseous abnormalities. IMPRESSION: 1. Bilateral nephrolithiasis with 4 mm left proximal ureteral stone causing mild-to-moderate hydroureteronephrosis. There is an 8 x 9 mm nonobstructing right renal pelvic calculus within extrarenal pelvis. Bilateral perinephric fat stranding is seen. Cannot exclude urinary tract infection or pyelonephritis with these findings and in the absence of IV contrast. 2. Bilateral nephrolithiasis and tiny bladder calculi. 3. There is bibasilar patchy atelectasis with trace bilateral pleural effusions. More confluent areas of airspace opacity in both lower lobes cannot exclude pneumonia. 4. Mild transmural thickening of sigmoid colon. Findings are suspicious for colitis. Electronically Signed   By: Ashley Royalty M.D.   On: 08/03/2016 20:05   Ct Head Wo Contrast  Result Date: 08/03/2016 CLINICAL DATA:  70 year old female with confusion and altered mental status. Suspected sepsis. Initial encounter. EXAM: CT HEAD WITHOUT CONTRAST TECHNIQUE: Contiguous axial images were obtained from the base of the skull through the vertex without intravenous contrast. COMPARISON:  None. FINDINGS: Brain: Chronic encephalomalacia in the left frontal lobe along the anterior left frontal operculum. Mild ex vacuo enlargement of the adjacent left lateral ventricle. Mild superimposed other white matter hypodensity,  mostly along the frontal horns. No other cortical encephalomalacia identified. No midline shift, ventriculomegaly, mass effect, evidence of mass lesion, intracranial hemorrhage or evidence of cortically based acute infarction. Vascular: No suspicious intracranial vascular hyperdensity. Skull: No acute osseous abnormality identified. Sinuses/Orbits: Mild mucosal thickening right maxillary sinus. Trace fluid level right sphenoid sinus. Other Visualized paranasal sinuses and mastoids are well pneumatized. Other: Negative orbit and scalp soft tissues. IMPRESSION: 1.  No acute intracranial abnormality. 2. Chronic anterior division left MCA infarct. 3. Minimal right maxillary and sphenoid sinus inflammation, significance doubtful. Electronically Signed   By: Genevie Ann M.D.   On: 08/03/2016 18:34   Dg Chest Port 1 View  Result Date: 08/05/2016 CLINICAL DATA:  Respiratory failure EXAM: PORTABLE CHEST 1 VIEW COMPARISON:  08/04/2016 FINDINGS: Endotracheal tube is been removed. Central venous catheter tip remains in the SVC. NG removed. Hypoventilation with bibasilar atelectasis unchanged from the prior study. Decrease in vascular congestion since prior study. IMPRESSION: Hypoventilation with bibasilar atelectasis unchanged. Decrease in vascular congestion. Endotracheal tube and NG tube have been removed. Central venous catheter tip remains in the SVC. Electronically Signed   By: Franchot Gallo M.D.   On: 08/05/2016 06:58   Dg Chest Port 1 View  Result Date: 08/04/2016 CLINICAL DATA:  Central line placement EXAM: PORTABLE CHEST 1 VIEW COMPARISON:  08/04/2016 at 00:17 FINDINGS: Right jugular central line has been placed. Tip is in the expected location of the SVC just below the azygos vein junction. Endotracheal tube tip is 3.6 cm above the carina. Nasogastric tube extends into the stomach and beyond the inferior edge of the image. Dense consolidation persists in the left base with milder curvilinear opacity in the right  base, unchanged. No pneumothorax. IMPRESSION: 1. New right jugular central line.  No pneumothorax. 2. ET and NG tubes appear satisfactorily positioned. 3. No significant interval change in the bilateral airspace opacities. Electronically Signed  By: Andreas Newport M.D.   On: 08/04/2016 01:59   Dg Chest Port 1 View  Result Date: 08/04/2016 CLINICAL DATA:  Encephalopathy.  Intubated. EXAM: PORTABLE CHEST 1 VIEW COMPARISON:  08/03/2016 FINDINGS: Endotracheal tube is 4.6 cm above the carina. Nasogastric tube extends into the stomach and beyond the inferior edge of the image. Airspace opacities are present in both bases, worsened on the left were there now was more confluent consolidation. Probable small left pleural effusion. No pneumothorax. IMPRESSION: 1.  Support equipment appears satisfactorily positioned. 2. Worsening airspace opacity in the left base. Electronically Signed   By: Andreas Newport M.D.   On: 08/04/2016 00:59   Dg Chest Portable 1 View  Result Date: 08/03/2016 CLINICAL DATA:  Altered mental status EXAM: PORTABLE CHEST 1 VIEW COMPARISON:  None. FINDINGS: Low volume chest with streaky basilar opacities. No edema, effusion, or pneumothorax. Normal heart size and mediastinal contours when accounting for leftward rotation. IMPRESSION: Low volume chest with basilar atelectasis. Electronically Signed   By: Monte Fantasia M.D.   On: 08/03/2016 17:25   Dg Abd Portable 1v  Result Date: 08/07/2016 CLINICAL DATA:  Abdominal distension. EXAM: PORTABLE ABDOMEN - 1 VIEW COMPARISON:  08/04/2016 and CT abdomen pelvis 08/03/2016. FINDINGS: Mild nonspecific gaseous distention of small bowel and colon. There are bilateral ureteral stents in place. Nasogastric tube has been removed. Dextroconvex scoliosis, centered in the mid lumbar spine. IMPRESSION: Mild nonspecific gaseous distention of small bowel and colon. Electronically Signed   By: Lorin Picket M.D.   On: 08/07/2016 07:07   Dg Abd Portable  1v  Result Date: 08/04/2016 CLINICAL DATA:  Abdominal distension. EXAM: PORTABLE ABDOMEN - 1 VIEW COMPARISON:  CT scan of August 03, 2016. FINDINGS: The bowel gas pattern is normal. Distal tip of nasogastric tube is seen in expected position of distal stomach. Bilateral ureteral stents are noted. Right renal calculi are noted as described on prior CT scan. IMPRESSION: Right nephrolithiasis. Bilateral ureteral stents are noted. No evidence of bowel obstruction or ileus. Electronically Signed   By: Marijo Conception, M.D.   On: 08/04/2016 08:35       Subjective: -Further abdominal pain or rectal bleed. Appetite improved. No overnight issues.  Discharge Exam: Vitals:   08/09/16 0456 08/09/16 1014  BP: (!) 163/83 (!) 173/84  Pulse: 85   Resp: 16   Temp: 97.7 F (36.5 C)    Vitals:   08/08/16 2143 08/09/16 0456 08/09/16 0607 08/09/16 1014  BP: (!) 161/94 (!) 163/83  (!) 173/84  Pulse: 96 85    Resp: 16 16    Temp: 97.8 F (36.6 C) 97.7 F (36.5 C)    TempSrc: Oral Oral    SpO2: 99% 99%    Weight:   84.2 kg (185 lb 10 oz)   Height:        General: Elderly female not in distress HEENT: No pallor, moist mucosa, supple neck Chest: Clear to auscultation bilaterally CVS: Normal S1 and S2, no murmurs or gallop GI: Soft, nondistended, nontender, bowel sounds present Musculoskeletal: Warm, no edema CNS: Alert and oriented    The results of significant diagnostics from this hospitalization (including imaging, microbiology, ancillary and laboratory) are listed below for reference.     Microbiology: Recent Results (from the past 240 hour(s))  Urine culture     Status: Abnormal   Collection Time: 08/03/16  5:10 PM  Result Value Ref Range Status   Specimen Description URINE, RANDOM  Final   Special Requests  NONE  Final   Culture >=100,000 COLONIES/mL ESCHERICHIA COLI (A)  Final   Report Status 08/05/2016 FINAL  Final   Organism ID, Bacteria ESCHERICHIA COLI (A)  Final       Susceptibility   Escherichia coli - MIC*    AMPICILLIN >=32 RESISTANT Resistant     CEFAZOLIN <=4 SENSITIVE Sensitive     CEFTRIAXONE <=1 SENSITIVE Sensitive     CIPROFLOXACIN <=0.25 SENSITIVE Sensitive     GENTAMICIN <=1 SENSITIVE Sensitive     IMIPENEM <=0.25 SENSITIVE Sensitive     NITROFURANTOIN <=16 SENSITIVE Sensitive     TRIMETH/SULFA <=20 SENSITIVE Sensitive     AMPICILLIN/SULBACTAM 16 INTERMEDIATE Intermediate     PIP/TAZO <=4 SENSITIVE Sensitive     Extended ESBL NEGATIVE Sensitive     * >=100,000 COLONIES/mL ESCHERICHIA COLI  Culture, blood (Routine x 2)     Status: Abnormal   Collection Time: 08/03/16  5:20 PM  Result Value Ref Range Status   Specimen Description BLOOD RIGHT ANTECUBITAL  Final   Special Requests BOTTLES DRAWN AEROBIC AND ANAEROBIC 5CC  Final   Culture  Setup Time   Final    GRAM NEGATIVE RODS IN BOTH AEROBIC AND ANAEROBIC BOTTLES CRITICAL VALUE NOTED.  VALUE IS CONSISTENT WITH PREVIOUSLY REPORTED AND CALLED VALUE.    Culture (A)  Final    ESCHERICHIA COLI SUSCEPTIBILITIES PERFORMED ON PREVIOUS CULTURE WITHIN THE LAST 5 DAYS.    Report Status 08/07/2016 FINAL  Final  Culture, blood (Routine x 2)     Status: Abnormal   Collection Time: 08/03/16  5:30 PM  Result Value Ref Range Status   Specimen Description BLOOD LEFT WRIST  Final   Special Requests BOTTLES DRAWN AEROBIC AND ANAEROBIC 5CC  Final   Culture  Setup Time   Final    GRAM NEGATIVE RODS IN BOTH AEROBIC AND ANAEROBIC BOTTLES CRITICAL RESULT CALLED TO, READ BACK BY AND VERIFIED WITH: Sheffield Slider, [place] AT [id number] ON [id number] BY Rhea Bleacher    Culture ESCHERICHIA COLI (A)  Final   Report Status 08/07/2016 FINAL  Final   Organism ID, Bacteria ESCHERICHIA COLI  Final      Susceptibility   Escherichia coli - MIC*    AMPICILLIN >=32 RESISTANT Resistant     CEFAZOLIN <=4 SENSITIVE Sensitive     CEFEPIME <=1 SENSITIVE Sensitive     CEFTAZIDIME <=1 SENSITIVE Sensitive     CEFTRIAXONE <=1  SENSITIVE Sensitive     CIPROFLOXACIN <=0.25 SENSITIVE Sensitive     GENTAMICIN <=1 SENSITIVE Sensitive     IMIPENEM <=0.25 SENSITIVE Sensitive     TRIMETH/SULFA <=20 SENSITIVE Sensitive     AMPICILLIN/SULBACTAM 16 INTERMEDIATE Intermediate     PIP/TAZO <=4 SENSITIVE Sensitive     Extended ESBL NEGATIVE Sensitive     * ESCHERICHIA COLI  Blood Culture ID Panel (Reflexed)     Status: Abnormal   Collection Time: 08/03/16  5:30 PM  Result Value Ref Range Status   Enterococcus species NOT DETECTED NOT DETECTED Final   Listeria monocytogenes NOT DETECTED NOT DETECTED Final   Staphylococcus species NOT DETECTED NOT DETECTED Final   Staphylococcus aureus NOT DETECTED NOT DETECTED Final   Streptococcus species NOT DETECTED NOT DETECTED Final   Streptococcus agalactiae NOT DETECTED NOT DETECTED Final   Streptococcus pneumoniae NOT DETECTED NOT DETECTED Final   Streptococcus pyogenes NOT DETECTED NOT DETECTED Final   Acinetobacter baumannii NOT DETECTED NOT DETECTED Final   Enterobacteriaceae species DETECTED (A) NOT DETECTED Final  Comment: CRITICAL RESULT CALLED TO, READ BACK BY AND VERIFIED WITH: Sheffield Slider, [place] AT [id number] ON [id number] BY S. YARBROUGH    Enterobacter cloacae complex NOT DETECTED NOT DETECTED Final   Escherichia coli DETECTED (A) NOT DETECTED Final    Comment: CRITICAL RESULT CALLED TO, READ BACK BY AND VERIFIED WITH: Sheffield Slider, [place] AT 0853 ON [id number] BY S. YARBROUGH    Klebsiella oxytoca NOT DETECTED NOT DETECTED Final   Klebsiella pneumoniae NOT DETECTED NOT DETECTED Final   Proteus species NOT DETECTED NOT DETECTED Final   Serratia marcescens NOT DETECTED NOT DETECTED Final   Carbapenem resistance NOT DETECTED NOT DETECTED Final   Haemophilus influenzae NOT DETECTED NOT DETECTED Final   Neisseria meningitidis NOT DETECTED NOT DETECTED Final   Pseudomonas aeruginosa NOT DETECTED NOT DETECTED Final   Candida albicans NOT DETECTED NOT DETECTED Final   Candida  glabrata NOT DETECTED NOT DETECTED Final   Candida krusei NOT DETECTED NOT DETECTED Final   Candida parapsilosis NOT DETECTED NOT DETECTED Final   Candida tropicalis NOT DETECTED NOT DETECTED Final  MRSA PCR Screening     Status: None   Collection Time: 08/04/16 12:30 AM  Result Value Ref Range Status   MRSA by PCR NEGATIVE NEGATIVE Final    Comment:        The GeneXpert MRSA Assay (FDA approved for NASAL specimens only), is one component of a comprehensive MRSA colonization surveillance program. It is not intended to diagnose MRSA infection nor to guide or monitor treatment for MRSA infections.   Culture, blood (Routine X 2) w Reflex to ID Panel     Status: None (Preliminary result)   Collection Time: 08/06/16 10:40 AM  Result Value Ref Range Status   Specimen Description BLOOD LEFT WRIST  10 ML IN AEROBIC ONLY  Final   Special Requests NONE  Final   Culture   Final    NO GROWTH 2 DAYS Performed at [facility]    Report Status PENDING  Incomplete  Culture, blood (Routine X 2) w Reflex to ID Panel     Status: None (Preliminary result)   Collection Time: 08/06/16 10:40 AM  Result Value Ref Range Status   Specimen Description BLOOD RIGHT ARM  5 ML IN AEROBIC ONLY  Final   Special Requests NONE  Final   Culture   Final    NO GROWTH 2 DAYS Performed at [facility]    Report Status PENDING  Incomplete     Labs: BNP (last 3 results) No results for input(s): BNP in the last 8760 hours. Basic Metabolic Panel:  Recent Labs Lab 08/05/16 0555 08/05/16 1143 08/06/16 0059 08/06/16 0548 08/07/16 0348 08/08/16 0426 08/09/16 0355  NA 144  --  149*  --  143 141 138  K 3.9  --  3.6  --  2.9* 3.9 3.4*  CL 113*  --  120*  --  116* 111 108  CO2 24  --  23  --  20* 22 25  GLUCOSE 96  --  110*  --  87 100* 123*  BUN 56*  --  51*  --  42* 30* 20  CREATININE 2.26*  --  1.95*  --  1.58* 1.38* 1.00  CALCIUM 7.2*  --  7.7*  --  7.4* 7.9* 7.7*  MG  --  2.6*  --  2.5*  2.0 2.0 1.8  PHOS  --  3.4  --  3.8 1.5* 2.9 2.3*   Liver Function Tests:  Recent Labs Lab 08/03/16 1655 08/04/16 0201 08/06/16 0548  AST 112* 96* 60*  ALT 70* 63* 57*  ALKPHOS 188* 92 127*  BILITOT 0.8 0.9 1.1  PROT 6.1* 5.6* 5.5*  ALBUMIN 2.6* 2.6* 2.1*   No results for input(s): LIPASE, AMYLASE in the last 168 hours. No results for input(s): AMMONIA in the last 168 hours. CBC:  Recent Labs Lab 08/03/16 1655  08/05/16 1730 08/06/16 0548 08/07/16 0348 08/08/16 0426 08/09/16 0355  WBC 5.9  < > 14.0* 11.2* 13.2* 13.1* 15.0*  NEUTROABS 5.5  --  12.0* 9.2*  --   --   --   HGB 10.8*  < > 9.2* 8.8* 8.9* 10.7* 9.1*  HCT 33.8*  < > 29.4* 28.4* 28.7* 33.9* 28.8*  MCV 78.4  < > 79.2 79.8 76.9* 80.1 79.3  PLT 41*  < > 29* 36* 42* 55* 79*  < > = values in this interval not displayed. Cardiac Enzymes:  Recent Labs Lab 08/06/16 0548 08/06/16 1040 08/06/16 1654 08/06/16 2039  TROPONINI 0.05* 0.04* 0.04* 0.04*   BNP: Invalid input(s): POCBNP CBG:  Recent Labs Lab 08/03/16 1703  GLUCAP 112*   D-Dimer No results for input(s): DDIMER in the last 72 hours. Hgb A1c No results for input(s): HGBA1C in the last 72 hours. Lipid Profile No results for input(s): CHOL, HDL, LDLCALC, TRIG, CHOLHDL, LDLDIRECT in the last 72 hours. Thyroid function studies No results for input(s): TSH, T4TOTAL, T3FREE, THYROIDAB in the last 72 hours.  Invalid input(s): FREET3 Anemia work up No results for input(s): VITAMINB12, FOLATE, FERRITIN, TIBC, IRON, RETICCTPCT in the last 72 hours. Urinalysis    Component Value Date/Time   COLORURINE YELLOW 08/03/2016 1710   APPEARANCEUR HAZY (A) 08/03/2016 1710   LABSPEC 1.025 08/03/2016 1710   PHURINE 5.5 08/03/2016 1710   GLUCOSEU NEGATIVE 08/03/2016 1710   HGBUR LARGE (A) 08/03/2016 1710   BILIRUBINUR NEGATIVE 08/03/2016 1710   KETONESUR NEGATIVE 08/03/2016 1710   PROTEINUR 100 (A) 08/03/2016 1710   NITRITE NEGATIVE 08/03/2016 1710    LEUKOCYTESUR MODERATE (A) 08/03/2016 1710   Sepsis Labs Invalid input(s): PROCALCITONIN,  WBC,  LACTICIDVEN Microbiology Recent Results (from the past 240 hour(s))  Urine culture     Status: Abnormal   Collection Time: 08/03/16  5:10 PM  Result Value Ref Range Status   Specimen Description URINE, RANDOM  Final   Special Requests NONE  Final   Culture >=100,000 COLONIES/mL ESCHERICHIA COLI (A)  Final   Report Status 08/05/2016 FINAL  Final   Organism ID, Bacteria ESCHERICHIA COLI (A)  Final      Susceptibility   Escherichia coli - MIC*    AMPICILLIN >=32 RESISTANT Resistant     CEFAZOLIN <=4 SENSITIVE Sensitive     CEFTRIAXONE <=1 SENSITIVE Sensitive     CIPROFLOXACIN <=0.25 SENSITIVE Sensitive     GENTAMICIN <=1 SENSITIVE Sensitive     IMIPENEM <=0.25 SENSITIVE Sensitive     NITROFURANTOIN <=16 SENSITIVE Sensitive     TRIMETH/SULFA <=20 SENSITIVE Sensitive     AMPICILLIN/SULBACTAM 16 INTERMEDIATE Intermediate     PIP/TAZO <=4 SENSITIVE Sensitive     Extended ESBL NEGATIVE Sensitive     * >=100,000 COLONIES/mL ESCHERICHIA COLI  Culture, blood (Routine x 2)     Status: Abnormal   Collection Time: 08/03/16  5:20 PM  Result Value Ref Range Status   Specimen Description BLOOD RIGHT ANTECUBITAL  Final   Special Requests BOTTLES DRAWN AEROBIC AND ANAEROBIC 5CC  Final   Culture  Setup Time   Final    GRAM NEGATIVE RODS IN BOTH AEROBIC AND ANAEROBIC BOTTLES CRITICAL VALUE NOTED.  VALUE IS CONSISTENT WITH PREVIOUSLY REPORTED AND CALLED VALUE.    Culture (A)  Final    ESCHERICHIA COLI SUSCEPTIBILITIES PERFORMED ON PREVIOUS CULTURE WITHIN THE LAST 5 DAYS.    Report Status 08/07/2016 FINAL  Final  Culture, blood (Routine x 2)     Status: Abnormal   Collection Time: 08/03/16  5:30 PM  Result Value Ref Range Status   Specimen Description BLOOD LEFT WRIST  Final   Special Requests BOTTLES DRAWN AEROBIC AND ANAEROBIC 5CC  Final   Culture  Setup Time   Final    GRAM NEGATIVE RODS IN  BOTH AEROBIC AND ANAEROBIC BOTTLES CRITICAL RESULT CALLED TO, READ BACK BY AND VERIFIED WITH: Sheffield Slider, [place] AT [id number] ON [id number] BY Rhea Bleacher    Culture ESCHERICHIA COLI (A)  Final   Report Status 08/07/2016 FINAL  Final   Organism ID, Bacteria ESCHERICHIA COLI  Final      Susceptibility   Escherichia coli - MIC*    AMPICILLIN >=32 RESISTANT Resistant     CEFAZOLIN <=4 SENSITIVE Sensitive     CEFEPIME <=1 SENSITIVE Sensitive     CEFTAZIDIME <=1 SENSITIVE Sensitive     CEFTRIAXONE <=1 SENSITIVE Sensitive     CIPROFLOXACIN <=0.25 SENSITIVE Sensitive     GENTAMICIN <=1 SENSITIVE Sensitive     IMIPENEM <=0.25 SENSITIVE Sensitive     TRIMETH/SULFA <=20 SENSITIVE Sensitive     AMPICILLIN/SULBACTAM 16 INTERMEDIATE Intermediate     PIP/TAZO <=4 SENSITIVE Sensitive     Extended ESBL NEGATIVE Sensitive     * ESCHERICHIA COLI  Blood Culture ID Panel (Reflexed)     Status: Abnormal   Collection Time: 08/03/16  5:30 PM  Result Value Ref Range Status   Enterococcus species NOT DETECTED NOT DETECTED Final   Listeria monocytogenes NOT DETECTED NOT DETECTED Final   Staphylococcus species NOT DETECTED NOT DETECTED Final   Staphylococcus aureus NOT DETECTED NOT DETECTED Final   Streptococcus species NOT DETECTED NOT DETECTED Final   Streptococcus agalactiae NOT DETECTED NOT DETECTED Final   Streptococcus pneumoniae NOT DETECTED NOT DETECTED Final   Streptococcus pyogenes NOT DETECTED NOT DETECTED Final   Acinetobacter baumannii NOT DETECTED NOT DETECTED Final   Enterobacteriaceae species DETECTED (A) NOT DETECTED Final    Comment: CRITICAL RESULT CALLED TO, READ BACK BY AND VERIFIED WITH: Sheffield Slider, [place] AT [id number] ON [id number] BY S. YARBROUGH    Enterobacter cloacae complex NOT DETECTED NOT DETECTED Final   Escherichia coli DETECTED (A) NOT DETECTED Final    Comment: CRITICAL RESULT CALLED TO, READ BACK BY AND VERIFIED WITH: Sheffield Slider, PHARM AT [id number] ON [id number] BY S. YARBROUGH     Klebsiella oxytoca NOT DETECTED NOT DETECTED Final   Klebsiella pneumoniae NOT DETECTED NOT DETECTED Final   Proteus species NOT DETECTED NOT DETECTED Final   Serratia marcescens NOT DETECTED NOT DETECTED Final   Carbapenem resistance NOT DETECTED NOT DETECTED Final   Haemophilus influenzae NOT DETECTED NOT DETECTED Final   Neisseria meningitidis NOT DETECTED NOT DETECTED Final   Pseudomonas aeruginosa NOT DETECTED NOT DETECTED Final   Candida albicans NOT DETECTED NOT DETECTED Final   Candida glabrata NOT DETECTED NOT DETECTED Final   Candida krusei NOT DETECTED NOT DETECTED Final   Candida parapsilosis NOT DETECTED NOT DETECTED Final   Candida tropicalis NOT DETECTED NOT DETECTED Final  MRSA PCR Screening  Status: None   Collection Time: 08/04/16 12:30 AM  Result Value Ref Range Status   MRSA by PCR NEGATIVE NEGATIVE Final    Comment:        The GeneXpert MRSA Assay (FDA approved for NASAL specimens only), is one component of a comprehensive MRSA colonization surveillance program. It is not intended to diagnose MRSA infection nor to guide or monitor treatment for MRSA infections.   Culture, blood (Routine X 2) w Reflex to ID Panel     Status: None (Preliminary result)   Collection Time: 08/06/16 10:40 AM  Result Value Ref Range Status   Specimen Description BLOOD LEFT WRIST  10 ML IN AEROBIC ONLY  Final   Special Requests NONE  Final   Culture   Final    NO GROWTH 2 DAYS Performed at [facility]    Report Status PENDING  Incomplete  Culture, blood (Routine X 2) w Reflex to ID Panel     Status: None (Preliminary result)   Collection Time: 08/06/16 10:40 AM  Result Value Ref Range Status   Specimen Description BLOOD RIGHT ARM  5 ML IN AEROBIC ONLY  Final   Special Requests NONE  Final   Culture   Final    NO GROWTH 2 DAYS Performed at [facility]    Report Status PENDING  Incomplete     Time coordinating discharge: Over 30  minutes  SIGNED:   Louellen Molder, MD  [facility] 08/09/2016, 2:05 PM Pager   If 7PM-7AM, please contact night-coverage [email or web address] Password [email or web address]

## 2016-08-09 NOTE — Clinical Social Work Placement (Signed)
   CLINICAL SOCIAL WORK PLACEMENT  NOTE  Date:  08/09/2016  Patient Details  Name: Regina Richards MRN: [id number] Date of Birth: 05-14-1946  Clinical Social Work is seeking post-discharge placement for this patient at the [place] level of care (*CSW will initial, date and re-position this form in  chart as items are completed):  Yes   Patient/family provided with [place] Work Department's list of facilities offering this level of care within the geographic area requested by the patient (or if unable, by the patient's family).  Yes   Patient/family informed of their freedom to choose among providers that offer the needed level of care, that participate in Medicare, Medicaid or managed care program needed by the patient, have an available bed and are willing to accept the patient.  Yes   Patient/family informed of [facility]'s ownership interest in [facility] and [facility], as well as of the fact that they are under no obligation to receive care at these facilities.  PASRR submitted to EDS on 08/09/16     PASRR number received on 08/09/16     Existing PASRR number confirmed on       FL2 transmitted to all facilities in geographic area requested by pt/family on 08/09/16     FL2 transmitted to all facilities within larger geographic area on       Patient informed that his/her managed care company has contracts with or will negotiate with certain facilities, including the following:            Patient/family informed of bed offers received.  Patient chooses bed at       Physician recommends and patient chooses bed at      Patient to be transferred to   on  .  Patient to be transferred to facility by       Patient family notified on   of transfer.  Name of family member notified:        PHYSICIAN       Additional Comment:    _______________________________________________ Standley Brooking, LCSW 08/09/2016, 11:00 AM

## 2016-08-09 NOTE — Progress Notes (Signed)
Nursing Note: Pt incont of small loose brown stool.Noted generalized edema to arms,legs and abd.A: Paged on-call and IV rate decreased to 75 cc/hr.Pt awake,alert and denies pain or any distress.wbb

## 2016-08-09 NOTE — Progress Notes (Signed)
Pt calm,cooperative and made no attempts to get OOB all shift.wbb

## 2016-08-09 NOTE — Clinical Social Work Placement (Signed)
Patient is set to discharge to [facility] today. Patient & sister, [name] made aware. Discharge packet given to RN, Festus Holts. [facility] called for transport to pickup at 4:00pm.     Raynaldo Opitz, [place] Worker cell #: [phone]    CLINICAL SOCIAL WORK PLACEMENT  NOTE  Date:  08/09/2016  Patient Details  Name: Regina Richards MRN: [id number] Date of Birth: Aug 23, 1946  Clinical Social Work is seeking post-discharge placement for this patient at the [place] level of care (*CSW will initial, date and re-position this form in  chart as items are completed):  Yes   Patient/family provided with [place] Work Department's list of facilities offering this level of care within the geographic area requested by the patient (or if unable, by the patient's family).  Yes   Patient/family informed of their freedom to choose among providers that offer the needed level of care, that participate in Medicare, Medicaid or managed care program needed by the patient, have an available bed and are willing to accept the patient.  Yes   Patient/family informed of [facility]'s ownership interest in [facility] and [facility], as well as of the fact that they are under no obligation to receive care at these facilities.  PASRR submitted to EDS on 08/09/16     PASRR number received on 08/09/16     Existing PASRR number confirmed on       FL2 transmitted to all facilities in geographic area requested by pt/family on 08/09/16     FL2 transmitted to all facilities within larger geographic area on       Patient informed that his/her managed care company has contracts with or will negotiate with certain facilities, including the following:        Yes   Patient/family informed of bed offers received.  Patient chooses bed at [facility]     Physician recommends and patient chooses bed at      Patient to be  transferred to [facility] on 08/09/16.  Patient to be transferred to facility by [facility]     Patient family notified on 08/09/16 of transfer.  Name of family member notified:  patient's sister, [name] via phone     PHYSICIAN       Additional Comment:    _______________________________________________ Standley Brooking, LCSW 08/09/2016, 1:54 PM

## 2016-08-09 NOTE — Care Management Important Message (Signed)
Important Message  Patient Details  Name: Regina Richards MRN: [id number] Date of Birth: 09-28-1945   Medicare Important Message Given:  Yes    Camillo Flaming 08/09/2016, 10:54 AMImportant Message  Patient Details  Name: Regina Richards MRN: [id number] Date of Birth: 07/09/1946   Medicare Important Message Given:  Yes    Camillo Flaming 08/09/2016, 10:54 AM

## 2016-08-09 NOTE — NC FL2 (Signed)
[place] LEVEL OF CARE SCREENING TOOL     IDENTIFICATION  Patient Name: Regina Richards [id number] Birthdate: [birth year] Sex: female Admission Date (Current Location): 08/03/2016  [facility] [place] and [place] Number:  Herbalist and Address:  [facility],  [place] [address] [place]      Provider Number: [id number]  Attending Physician Name and Address:  Louellen Molder, MD  Relative Name and Phone Number:       Current Level of Care: Hospital Recommended Level of Care: [place] Prior Approval Number:    Date Approved/Denied:   PASRR Number: [id number]  Discharge Plan: SNF    Current Diagnoses: Patient Active Problem List   Diagnosis Date Noted  . Septic shock ([place]) 08/04/2016  . Acute kidney injury ([place])   . Acute pyelonephritis   . Acute hypoxemic respiratory failure ([place])   . Sepsis ([place]) 08/03/2016    Orientation RESPIRATION BLADDER Height & Weight     Self, Time, Situation, Place  Normal Continent Weight: 185 lb 10 oz (84.2 kg) Height:  5\' 7"  (170.2 cm)  BEHAVIORAL SYMPTOMS/MOOD NEUROLOGICAL BOWEL NUTRITION STATUS      Continent Diet (Full Liquid Diet)  AMBULATORY STATUS COMMUNICATION OF NEEDS Skin   Extensive Assist Verbally Normal                       Personal Care Assistance Level of Assistance  Bathing, Dressing Bathing Assistance: Limited assistance   Dressing Assistance: Limited assistance     Functional Limitations Info             SPECIAL CARE FACTORS FREQUENCY  PT (By licensed PT), OT (By licensed OT)     PT Frequency: 5 OT Frequency: 5            Contractures      Additional Factors Info  Code Status, Allergies Code Status Info: Fullcode Allergies Info: NKDA           Current Medications (08/09/2016):  This is the current hospital active medication list Current Facility-Administered Medications  Medication Dose Route Frequency Provider Last Rate Last Dose  . amLODipine  (NORVASC) tablet 10 mg  10 mg Oral Daily Albertine Patricia, MD   10 mg at 08/09/16 1013  . bisacodyl (DULCOLAX) suppository 10 mg  10 mg Rectal Daily PRN Collene Gobble, MD   10 mg at 08/07/16 1036  . bisacodyl (DULCOLAX) suppository 10 mg  10 mg Rectal BID Albertine Patricia, MD   10 mg at 08/09/16 1013  . cefTRIAXone (ROCEPHIN) 2 g in dextrose 5 % 50 mL IVPB  2 g Intravenous Q24H Albertine Patricia, MD   2 g at 08/08/16 1222  . dextrose 5 %-0.45 % sodium chloride infusion   Intravenous Continuous Rhetta Mura Schorr, NP 75 mL/hr at 08/09/16 0455 1,000 mL at 08/09/16 0455  . famotidine (PEPCID) tablet 20 mg  20 mg Oral Daily Berton Mount, RPH   20 mg at 08/09/16 1013  . haloperidol lactate (HALDOL) injection 2 mg  2 mg Intravenous Q4H PRN Albertine Patricia, MD   2 mg at 08/06/16 2140  . hydrALAZINE (APRESOLINE) injection 10 mg  10 mg Intravenous Q4H PRN Anders Simmonds, MD   10 mg at 08/08/16 1020  . lip balm (CARMEX) ointment   Topical PRN Grace Bushy Minor, NP      . MEDLINE mouth rinse  15 mL Mouth Rinse QID Marshell Garfinkel, MD  15 mL at 08/09/16 0400  . PARoxetine (PAXIL) tablet 40 mg  40 mg Oral Daily Brand Males, MD   40 mg at 08/09/16 1013  . simethicone (MYLICON) 40 [id number] suspension 40 mg  40 mg Oral QID Albertine Patricia, MD   40 mg at 08/09/16 1025  . sodium phosphate (FLEET) 7-19 GM/118ML enema 1 enema  1 enema Rectal Once Albertine Patricia, MD   Stopped at 08/07/16 1200     Discharge Medications: Please see discharge summary for a list of discharge medications.  Relevant Imaging Results:  Relevant Lab Results:   Additional Information SSN: [id number]  Standley Brooking, LCSW

## 2016-08-09 NOTE — Progress Notes (Signed)
Report called to [facility] . Patient to be transported via ambulance.

## 2016-08-09 NOTE — Progress Notes (Signed)
Met with pt at bedside for DC planning. Pt appears alert and oriented however has had confusion reported during hospitalization. Pt gave this CM permission to contact sister to discuss DC plans. Per sister Bryson Ha, they plan to bring pt home with caregivers but would like pt to be faxed out to snfs as a back up plan. CSW made aware of conversation. Bryson Ha to call this CM back with decision on plan. Marney Doctor RN,BSN,NCM [phone]

## 2016-08-11 DIAGNOSIS — K922 Gastrointestinal hemorrhage, unspecified: Secondary | ICD-10-CM | POA: Diagnosis not present

## 2016-08-11 DIAGNOSIS — R945 Abnormal results of liver function studies: Secondary | ICD-10-CM | POA: Diagnosis not present

## 2016-08-11 DIAGNOSIS — F329 Major depressive disorder, single episode, unspecified: Secondary | ICD-10-CM | POA: Diagnosis not present

## 2016-08-11 DIAGNOSIS — N200 Calculus of kidney: Secondary | ICD-10-CM | POA: Diagnosis not present

## 2016-08-11 DIAGNOSIS — J9610 Chronic respiratory failure, unspecified whether with hypoxia or hypercapnia: Secondary | ICD-10-CM | POA: Diagnosis not present

## 2016-08-11 DIAGNOSIS — I10 Essential (primary) hypertension: Secondary | ICD-10-CM | POA: Diagnosis not present

## 2016-08-11 DIAGNOSIS — D6949 Other primary thrombocytopenia: Secondary | ICD-10-CM | POA: Diagnosis not present

## 2016-08-11 DIAGNOSIS — E43 Unspecified severe protein-calorie malnutrition: Secondary | ICD-10-CM | POA: Diagnosis not present

## 2016-08-11 DIAGNOSIS — D6489 Other specified anemias: Secondary | ICD-10-CM | POA: Diagnosis not present

## 2016-08-11 DIAGNOSIS — A4151 Sepsis due to Escherichia coli [E. coli]: Secondary | ICD-10-CM | POA: Diagnosis not present

## 2016-08-11 LAB — CULTURE, BLOOD (ROUTINE X 2)
Culture: NO GROWTH
Culture: NO GROWTH

## 2016-08-14 DIAGNOSIS — N201 Calculus of ureter: Secondary | ICD-10-CM | POA: Diagnosis not present

## 2016-08-14 DIAGNOSIS — I10 Essential (primary) hypertension: Secondary | ICD-10-CM | POA: Diagnosis not present

## 2016-08-14 DIAGNOSIS — N132 Hydronephrosis with renal and ureteral calculous obstruction: Secondary | ICD-10-CM | POA: Diagnosis not present

## 2016-08-14 DIAGNOSIS — R6521 Severe sepsis with septic shock: Secondary | ICD-10-CM | POA: Diagnosis not present

## 2016-08-14 DIAGNOSIS — N136 Pyonephrosis: Secondary | ICD-10-CM | POA: Diagnosis not present

## 2016-08-16 DIAGNOSIS — R6521 Severe sepsis with septic shock: Secondary | ICD-10-CM | POA: Diagnosis not present

## 2016-08-16 DIAGNOSIS — K625 Hemorrhage of anus and rectum: Secondary | ICD-10-CM | POA: Diagnosis not present

## 2016-08-16 DIAGNOSIS — N201 Calculus of ureter: Secondary | ICD-10-CM | POA: Diagnosis not present

## 2016-08-16 DIAGNOSIS — N136 Pyonephrosis: Secondary | ICD-10-CM | POA: Diagnosis not present

## 2016-08-16 DIAGNOSIS — N132 Hydronephrosis with renal and ureteral calculous obstruction: Secondary | ICD-10-CM | POA: Diagnosis not present

## 2016-08-17 DIAGNOSIS — N202 Calculus of kidney with calculus of ureter: Secondary | ICD-10-CM | POA: Diagnosis not present

## 2016-08-18 ENCOUNTER — Other Ambulatory Visit: Payer: Self-pay | Admitting: Urology

## 2016-08-22 DIAGNOSIS — Z9181 History of falling: Secondary | ICD-10-CM | POA: Diagnosis not present

## 2016-08-22 DIAGNOSIS — R2689 Other abnormalities of gait and mobility: Secondary | ICD-10-CM | POA: Diagnosis not present

## 2016-08-22 DIAGNOSIS — M6281 Muscle weakness (generalized): Secondary | ICD-10-CM | POA: Diagnosis not present

## 2016-08-22 DIAGNOSIS — I10 Essential (primary) hypertension: Secondary | ICD-10-CM | POA: Diagnosis not present

## 2016-08-22 DIAGNOSIS — F329 Major depressive disorder, single episode, unspecified: Secondary | ICD-10-CM | POA: Diagnosis not present

## 2016-08-24 DIAGNOSIS — I10 Essential (primary) hypertension: Secondary | ICD-10-CM | POA: Diagnosis not present

## 2016-08-24 DIAGNOSIS — F329 Major depressive disorder, single episode, unspecified: Secondary | ICD-10-CM | POA: Diagnosis not present

## 2016-08-24 DIAGNOSIS — M6281 Muscle weakness (generalized): Secondary | ICD-10-CM | POA: Diagnosis not present

## 2016-08-24 DIAGNOSIS — R2689 Other abnormalities of gait and mobility: Secondary | ICD-10-CM | POA: Diagnosis not present

## 2016-08-24 DIAGNOSIS — Z9181 History of falling: Secondary | ICD-10-CM | POA: Diagnosis not present

## 2016-08-29 DIAGNOSIS — Z9181 History of falling: Secondary | ICD-10-CM | POA: Diagnosis not present

## 2016-08-29 DIAGNOSIS — F329 Major depressive disorder, single episode, unspecified: Secondary | ICD-10-CM | POA: Diagnosis not present

## 2016-08-29 DIAGNOSIS — M6281 Muscle weakness (generalized): Secondary | ICD-10-CM | POA: Diagnosis not present

## 2016-08-29 DIAGNOSIS — R2689 Other abnormalities of gait and mobility: Secondary | ICD-10-CM | POA: Diagnosis not present

## 2016-08-29 DIAGNOSIS — I10 Essential (primary) hypertension: Secondary | ICD-10-CM | POA: Diagnosis not present

## 2016-09-01 DIAGNOSIS — Z9181 History of falling: Secondary | ICD-10-CM | POA: Diagnosis not present

## 2016-09-01 DIAGNOSIS — F329 Major depressive disorder, single episode, unspecified: Secondary | ICD-10-CM | POA: Diagnosis not present

## 2016-09-01 DIAGNOSIS — I10 Essential (primary) hypertension: Secondary | ICD-10-CM | POA: Diagnosis not present

## 2016-09-01 DIAGNOSIS — R2689 Other abnormalities of gait and mobility: Secondary | ICD-10-CM | POA: Diagnosis not present

## 2016-09-01 DIAGNOSIS — M6281 Muscle weakness (generalized): Secondary | ICD-10-CM | POA: Diagnosis not present

## 2016-09-04 DIAGNOSIS — M6281 Muscle weakness (generalized): Secondary | ICD-10-CM | POA: Diagnosis not present

## 2016-09-04 DIAGNOSIS — I10 Essential (primary) hypertension: Secondary | ICD-10-CM | POA: Diagnosis not present

## 2016-09-04 DIAGNOSIS — F329 Major depressive disorder, single episode, unspecified: Secondary | ICD-10-CM | POA: Diagnosis not present

## 2016-09-04 DIAGNOSIS — Z9181 History of falling: Secondary | ICD-10-CM | POA: Diagnosis not present

## 2016-09-04 DIAGNOSIS — R2689 Other abnormalities of gait and mobility: Secondary | ICD-10-CM | POA: Diagnosis not present

## 2016-09-05 NOTE — Patient Instructions (Signed)
Zaina Kemmerling Kanode  09/05/2016   Your procedure is scheduled on: 09/08/16  Report to [facility] Main  Entrance take [place]  elevators to 3rd floor to  [place] at   0900 AM.  Call this number if you have problems the morning of surgery [phone]   Remember: ONLY 1 PERSON MAY GO WITH YOU TO SHORT STAY TO GET  READY MORNING OF [place].  Do not eat food or drink liquids :After Midnight.     Take these medicines the morning of surgery with A SIP OF WATER: Norvasc,Paxil             You may not have any metal on your body including hair pins and              piercings  Do not wear jewelry, make-up, lotions, powders or perfumes, deodorant             Do not wear nail polish.  Do not shave  48 hours prior to surgery.                Do not bring valuables to the hospital. [place].  Contacts, dentures or bridgework may not be worn into surgery.     Patients discharged the day of surgery will not be allowed to drive home.  Name and phone number of your driver:  Special Instructions: N/A              Please read over the following fact sheets you were given: _____________________________________________________________________             [facility] - Preparing for Surgery Before surgery, you can play an important role.  Because skin is not sterile, your skin needs to be as free of germs as possible.  You can reduce the number of germs on your skin by washing with CHG (chlorahexidine gluconate) soap before surgery.  CHG is an antiseptic cleaner which kills germs and bonds with the skin to continue killing germs even after washing. Please DO NOT use if you have an allergy to CHG or antibacterial soaps.  If your skin becomes reddened/irritated stop using the CHG and inform your nurse when you arrive at Short Stay. Do not shave (including legs and underarms) for at least 48 hours prior to the first CHG shower.   You may shave your face/neck. Please follow these instructions carefully:  1.  Shower with CHG Soap the night before surgery and the  morning of Surgery.  2.  If you choose to wash your hair, wash your hair first as usual with your  normal  shampoo.  3.  After you shampoo, rinse your hair and body thoroughly to remove the  shampoo.                           4.  Use CHG as you would any other liquid soap.  You can apply chg directly  to the skin and wash                       Gently with a scrungie or clean washcloth.  5.  Apply the CHG Soap to your body ONLY FROM THE NECK DOWN.   Do not use  on face/ open                           Wound or open sores. Avoid contact with eyes, ears mouth and genitals (private parts).                       Wash face,  Genitals (private parts) with your normal soap.             6.  Wash thoroughly, paying special attention to the area where your surgery  will be performed.  7.  Thoroughly rinse your body with warm water from the neck down.  8.  DO NOT shower/wash with your normal soap after using and rinsing off  the CHG Soap.                9.  Pat yourself dry with a clean towel.            10.  Wear clean pajamas.            11.  Place clean sheets on your bed the night of your first shower and do not  sleep with pets. Day of Surgery : Do not apply any lotions/deodorants the morning of surgery.  Please wear clean clothes to the hospital/surgery center.  FAILURE TO FOLLOW THESE INSTRUCTIONS MAY RESULT IN THE CANCELLATION OF YOUR SURGERY PATIENT SIGNATURE_________________________________  NURSE SIGNATURE__________________________________  ________________________________________________________________________

## 2016-09-06 ENCOUNTER — Ambulatory Visit (HOSPITAL_COMMUNITY)
Admission: RE | Admit: 2016-09-06 | Discharge: 2016-09-06 | Disposition: A | Discharge status: Home / Self Care | Payer: Medicare Other | Source: Ambulatory Visit | Attending: Anesthesiology | Admitting: Anesthesiology

## 2016-09-06 ENCOUNTER — Encounter (HOSPITAL_COMMUNITY)
Admission: RE | Admit: 2016-09-06 | Discharge: 2016-09-06 | Disposition: A | Discharge status: Home / Self Care | Payer: Medicare Other | Source: Ambulatory Visit | Attending: Urology | Admitting: Urology

## 2016-09-06 ENCOUNTER — Encounter (HOSPITAL_COMMUNITY): Payer: Self-pay

## 2016-09-06 ENCOUNTER — Encounter (INDEPENDENT_AMBULATORY_CARE_PROVIDER_SITE_OTHER): Payer: Self-pay

## 2016-09-06 DIAGNOSIS — J9601 Acute respiratory failure with hypoxia: Secondary | ICD-10-CM | POA: Diagnosis not present

## 2016-09-06 DIAGNOSIS — Z01812 Encounter for preprocedural laboratory examination: Secondary | ICD-10-CM | POA: Diagnosis not present

## 2016-09-06 DIAGNOSIS — E876 Hypokalemia: Secondary | ICD-10-CM | POA: Insufficient documentation

## 2016-09-06 DIAGNOSIS — R509 Fever, unspecified: Secondary | ICD-10-CM | POA: Insufficient documentation

## 2016-09-06 DIAGNOSIS — Z01810 Encounter for preprocedural cardiovascular examination: Secondary | ICD-10-CM | POA: Insufficient documentation

## 2016-09-06 DIAGNOSIS — R05 Cough: Secondary | ICD-10-CM | POA: Diagnosis not present

## 2016-09-06 DIAGNOSIS — N179 Acute kidney failure, unspecified: Secondary | ICD-10-CM | POA: Diagnosis not present

## 2016-09-06 DIAGNOSIS — R0600 Dyspnea, unspecified: Secondary | ICD-10-CM | POA: Diagnosis not present

## 2016-09-06 DIAGNOSIS — Z01818 Encounter for other preprocedural examination: Secondary | ICD-10-CM | POA: Insufficient documentation

## 2016-09-06 DIAGNOSIS — I10 Essential (primary) hypertension: Secondary | ICD-10-CM | POA: Insufficient documentation

## 2016-09-06 DIAGNOSIS — I252 Old myocardial infarction: Secondary | ICD-10-CM | POA: Insufficient documentation

## 2016-09-06 DIAGNOSIS — N10 Acute tubulo-interstitial nephritis: Secondary | ICD-10-CM | POA: Insufficient documentation

## 2016-09-06 DIAGNOSIS — N132 Hydronephrosis with renal and ureteral calculous obstruction: Secondary | ICD-10-CM | POA: Insufficient documentation

## 2016-09-06 HISTORY — DX: Cardiac arrhythmia, unspecified: I49.9

## 2016-09-06 HISTORY — DX: Essential (primary) hypertension: I10

## 2016-09-06 HISTORY — DX: Unspecified osteoarthritis, unspecified site: M19.90

## 2016-09-06 HISTORY — DX: Personal history of urinary calculi: Z87.442

## 2016-09-06 HISTORY — DX: Nausea with vomiting, unspecified: Z98.890

## 2016-09-06 HISTORY — DX: Pneumonia, unspecified organism: J18.9

## 2016-09-06 HISTORY — DX: Other specified postprocedural states: R11.2

## 2016-09-06 HISTORY — DX: Anxiety disorder, unspecified: F41.9

## 2016-09-06 LAB — CBC
HCT: 30.6 % — ABNORMAL LOW (ref 36.0–46.0)
Hemoglobin: 9.5 g/dL — ABNORMAL LOW (ref 12.0–15.0)
MCH: 26 pg (ref 26.0–34.0)
MCHC: 31 g/dL (ref 30.0–36.0)
MCV: 83.8 fL (ref 78.0–100.0)
Platelets: 227 10*3/uL (ref 150–400)
RBC: 3.65 MIL/uL — AB (ref 3.87–5.11)
RDW: 18.7 % — ABNORMAL HIGH (ref 11.5–15.5)
WBC: 8.3 10*3/uL (ref 4.0–10.5)

## 2016-09-06 LAB — BASIC METABOLIC PANEL
ANION GAP: 7 (ref 5–15)
BUN: 17 mg/dL (ref 6–20)
CALCIUM: 8.9 mg/dL (ref 8.9–10.3)
CHLORIDE: 105 mmol/L (ref 101–111)
CO2: 26 mmol/L (ref 22–32)
Creatinine, Ser: 1.14 mg/dL — ABNORMAL HIGH (ref 0.44–1.00)
GFR calc non Af Amer: 48 mL/min — ABNORMAL LOW (ref >60)
GFR, EST AFRICAN AMERICAN: 56 mL/min — AB (ref >60)
Glucose, Bld: 95 mg/dL (ref 65–99)
Potassium: 2.9 mmol/L — ABNORMAL LOW (ref 3.5–5.1)
SODIUM: 138 mmol/L (ref 135–145)

## 2016-09-06 NOTE — Progress Notes (Signed)
EKG done 09/06/16 shown to anesthesia ( Dr Lissa Hoard).  No new orders given.

## 2016-09-06 NOTE — Progress Notes (Signed)
Final EKG done 09/06/16- epic

## 2016-09-06 NOTE — Progress Notes (Addendum)
CBC and BMP done 09/06/2016 faxed via epic to Dr. Karsten Ro. Spoke with Nira Conn (triage) at [facility] and she will make sure Dr. Karsten Ro is aware of K 2.9 done 1/10 .

## 2016-09-08 ENCOUNTER — Encounter (HOSPITAL_COMMUNITY): Payer: Self-pay | Admitting: *Deleted

## 2016-09-08 ENCOUNTER — Ambulatory Visit (HOSPITAL_COMMUNITY): Payer: Medicare Other | Admitting: Certified Registered Nurse Anesthetist

## 2016-09-08 ENCOUNTER — Encounter (HOSPITAL_COMMUNITY)
Admission: RE | Disposition: A | Discharge status: Home / Self Care | Payer: Self-pay | Source: Ambulatory Visit | Attending: Urology

## 2016-09-08 ENCOUNTER — Ambulatory Visit (HOSPITAL_COMMUNITY)
Admission: RE | Admit: 2016-09-08 | Discharge: 2016-09-08 | Disposition: A | Discharge status: Home / Self Care | Payer: Medicare Other | Source: Ambulatory Visit | Attending: Urology | Admitting: Urology

## 2016-09-08 DIAGNOSIS — N132 Hydronephrosis with renal and ureteral calculous obstruction: Secondary | ICD-10-CM

## 2016-09-08 DIAGNOSIS — F329 Major depressive disorder, single episode, unspecified: Secondary | ICD-10-CM | POA: Diagnosis not present

## 2016-09-08 DIAGNOSIS — N200 Calculus of kidney: Secondary | ICD-10-CM | POA: Insufficient documentation

## 2016-09-08 DIAGNOSIS — E876 Hypokalemia: Secondary | ICD-10-CM | POA: Diagnosis not present

## 2016-09-08 DIAGNOSIS — Z8249 Family history of ischemic heart disease and other diseases of the circulatory system: Secondary | ICD-10-CM | POA: Insufficient documentation

## 2016-09-08 DIAGNOSIS — I10 Essential (primary) hypertension: Secondary | ICD-10-CM | POA: Insufficient documentation

## 2016-09-08 DIAGNOSIS — N202 Calculus of kidney with calculus of ureter: Secondary | ICD-10-CM | POA: Diagnosis not present

## 2016-09-08 HISTORY — PX: CYSTOSCOPY/RETROGRADE/URETEROSCOPY/STONE EXTRACTION WITH BASKET: SHX5317

## 2016-09-08 LAB — POTASSIUM: Potassium: 3.2 mmol/L — ABNORMAL LOW (ref 3.5–5.1)

## 2016-09-08 SURGERY — CYSTOSCOPY, WITH CALCULUS REMOVAL USING BASKET
Anesthesia: General | Laterality: Bilateral

## 2016-09-08 MED ORDER — ONDANSETRON HCL 4 MG/2ML IJ SOLN
INTRAMUSCULAR | Status: DC | PRN
  Administered 2016-09-08: 4 mg via INTRAVENOUS

## 2016-09-08 MED ORDER — EPHEDRINE SULFATE-NACL 50-0.9 MG/10ML-% IV SOSY
PREFILLED_SYRINGE | INTRAVENOUS | Status: DC | PRN
  Administered 2016-09-08: 10 mg via INTRAVENOUS

## 2016-09-08 MED ORDER — FENTANYL CITRATE (PF) 100 MCG/2ML IJ SOLN
INTRAMUSCULAR | Status: AC
  Filled 2016-09-08: qty 2

## 2016-09-08 MED ORDER — FENTANYL CITRATE (PF) 100 MCG/2ML IJ SOLN
INTRAMUSCULAR | Status: DC | PRN
  Administered 2016-09-08: 50 ug via INTRAVENOUS

## 2016-09-08 MED ORDER — SODIUM CHLORIDE 0.9 % IR SOLN
Status: DC | PRN
  Administered 2016-09-08: 4500 mL via INTRAVESICAL

## 2016-09-08 MED ORDER — PHENYLEPHRINE 40 MCG/ML (10ML) SYRINGE FOR IV PUSH (FOR BLOOD PRESSURE SUPPORT)
PREFILLED_SYRINGE | INTRAVENOUS | Status: AC
  Filled 2016-09-08: qty 20

## 2016-09-08 MED ORDER — LIDOCAINE 2% (20 MG/ML) 5 ML SYRINGE
INTRAMUSCULAR | Status: DC | PRN
  Administered 2016-09-08: 60 mg via INTRAVENOUS

## 2016-09-08 MED ORDER — PROPOFOL 10 MG/ML IV BOLUS
INTRAVENOUS | Status: DC | PRN
  Administered 2016-09-08: 160 mg via INTRAVENOUS

## 2016-09-08 MED ORDER — CEFAZOLIN SODIUM-DEXTROSE 2-4 GM/100ML-% IV SOLN
INTRAVENOUS | Status: AC
  Filled 2016-09-08: qty 100

## 2016-09-08 MED ORDER — MIDAZOLAM HCL 2 MG/2ML IJ SOLN
INTRAMUSCULAR | Status: AC
  Filled 2016-09-08: qty 2

## 2016-09-08 MED ORDER — TAMSULOSIN HCL 0.4 MG PO CAPS: 0.4000 mg | ORAL_CAPSULE | ORAL | 0 refills | Status: DC

## 2016-09-08 MED ORDER — MEPERIDINE HCL 50 MG/ML IJ SOLN: 6.2500 mg | INTRAMUSCULAR | Status: DC | PRN

## 2016-09-08 MED ORDER — EPHEDRINE 5 MG/ML INJ
INTRAVENOUS | Status: AC
  Filled 2016-09-08: qty 10

## 2016-09-08 MED ORDER — IOHEXOL 300 MG/ML  SOLN
INTRAMUSCULAR | Status: DC | PRN
  Administered 2016-09-08: 4 mL via URETHRAL

## 2016-09-08 MED ORDER — MIDAZOLAM HCL 5 MG/5ML IJ SOLN
INTRAMUSCULAR | Status: DC | PRN
  Administered 2016-09-08: 2 mg via INTRAVENOUS

## 2016-09-08 MED ORDER — HYDROMORPHONE HCL 1 MG/ML IJ SOLN: 0.2500 mg | INTRAMUSCULAR | Status: DC | PRN

## 2016-09-08 MED ORDER — ONDANSETRON HCL 4 MG/2ML IJ SOLN: 4.0000 mg | Freq: Once | INTRAMUSCULAR | Status: DC | PRN

## 2016-09-08 MED ORDER — LACTATED RINGERS IV SOLN
INTRAVENOUS | Status: DC
  Administered 2016-09-08 (×2): via INTRAVENOUS

## 2016-09-08 MED ORDER — CEFAZOLIN SODIUM-DEXTROSE 2-4 GM/100ML-% IV SOLN
2.0000 g | INTRAVENOUS | Status: AC
  Administered 2016-09-08: 2 g via INTRAVENOUS

## 2016-09-08 MED ORDER — OXYCODONE HCL 10 MG PO TABS: 10.0000 mg | ORAL_TABLET | ORAL | 0 refills | Status: DC | PRN

## 2016-09-08 SURGICAL SUPPLY — 18 items
BAG URO CATCHER STRL LF (MISCELLANEOUS) ×2 IMPLANT
BASKET DAKOTA 1.9FR 11X120 (BASKET) ×1 IMPLANT
BASKET ZERO TIP 1.9FR (BASKET) ×2 IMPLANT
BSKT STON RTRVL ZERO TP 1.9FR (BASKET) ×1
CATH INTERMIT  6FR 70CM (CATHETERS) ×2 IMPLANT
CLOTH BEACON ORANGE TIMEOUT ST (SAFETY) ×2 IMPLANT
FIBER LASER TRAC TIP (UROLOGICAL SUPPLIES) ×1 IMPLANT
GLOVE BIOGEL M 8.0 STRL (GLOVE) ×2 IMPLANT
GOWN STRL REUS W/TWL XL LVL3 (GOWN DISPOSABLE) ×2 IMPLANT
GUIDEWIRE STR DUAL SENSOR (WIRE) ×2 IMPLANT
IV NS 1000ML (IV SOLUTION) ×2
IV NS 1000ML BAXH (IV SOLUTION) ×1 IMPLANT
MANIFOLD NEPTUNE II (INSTRUMENTS) ×2 IMPLANT
PACK CYSTO (CUSTOM PROCEDURE TRAY) ×2 IMPLANT
SHEATH ACCESS URETERAL 24CM (SHEATH) IMPLANT
SHEATH ACCESS URETERAL 38CM (SHEATH) ×1 IMPLANT
SHEATH ACCESS URETERAL 54CM (SHEATH) IMPLANT
TUBING CONNECTING 10 (TUBING) ×2 IMPLANT

## 2016-09-08 NOTE — Op Note (Signed)
PATIENT:  Regina Richards  PRE-OPERATIVE DIAGNOSIS: 1. Left renal calculus 2. Right renal calculi  POST-OPERATIVE DIAGNOSIS: Same  PROCEDURE:  1. Left ureteral stent removal  2. Left ureteroscopy and stone extraction 3. Right ureteral stent removal 4. Right retrograde pyelogram with interpretation 5. Right ureteroscopy, laser lithotripsy, stone extraction  6. Fluoroscopy used greater than one hour and less than 2 hours   SURGEON: Claybon Jabs, MD  INDICATION: Mrs. Yohn is a 71 year old female who developed urosepsis due to an obstructing 4 mm left proximal ureteral stone. She was taken emergently to the operating room for stent placement and on her CT scan she also had a stone that was free-floating in the right renal pelvis. Because of my concern that this could cause obstruction a right double-J stent was placed as well. Her right kidney had 2 large stones. The left kidney had the single 4 mm stone. She brought to the operating room today for management of her bilateral renal calculi.  ANESTHESIA:  General  EBL:  Minimal  DRAINS: None   SPECIMEN:  Stone given to patient  DESCRIPTION OF PROCEDURE: The patient was taken to the major OR and placed on the table. General anesthesia was administered and then the patient was moved to the dorsal lithotomy position. The genitalia was sterilely prepped and draped. An official timeout was performed.  Initially the 76 French cystoscope with 30 lens was passed under direct vision into the bladder. The bladder was then fully inspected. It was noted be free of any tumors, stones or inflammatory lesions. Ureteral orifices were of normal configuration and position with stents exiting both ureteral orifices. The stent on the left-hand side was grasped with alligator grasper and withdrawn through the urethral meatus. A 0.038 inch floppy-tipped guidewire was then passed through the stent and into the area the renal pelvis under fluoroscopy. This was  left in place and the stent was removed. Over the guidewire I was able to pass a ureteral access sheath that easily slid up the ureter to the area of the UPJ. I then used a 6 [place], digital, flexible ureteroscope and passed this through the access sheath into the renal pelvis and identified the stone. I used a [employer] and was able to engage the stone and withdrew it into the access sheath. I then was able to easily remove the access sheath, ureteroscope and stone simultaneously.  I then directed my attention to the right ureteral stent. The cystoscope was reinserted and the bladder, the stent was grasped and drawn to the meatus and a guidewire was passed up the stent into the renal pelvis and left in place. The ureteral access sheath was then passed over the guidewire and I positioned this at the ureteral pelvic junction and remove the guidewire. Through the ureteral access sheath I then performed a right retrograde pyelogram.  Full-strength Omnipaque contrast was then injected through the access sheath and this outlined a stone in the renal pelvis as well as in the upper pole. I therefore removed the inner portion of the access sheath leaving the access sheath in place and then passed the 6 French ureteroscope through the access sheath into the upper pole and used the 200  holmium laser fiber to fragment the stone. Once the stone was fragmented into smaller pieces I used the [place] basket to engage the stone and remove all of the fragments that were larger then 0.5 mm. I then directed my attention to the stone in the renal pelvis  and grasped this with a basket and placed in the upper pole. I then used the holmium laser fiber to fragment the stone in an identical fashion and then remove the fragments. Reinspection of all of the calyces was then undertaken with fragments noted in some of these which were grasped and extracted. A final inspection revealed no fragments greater than 0.5 mm could be  identified. I therefore removed the ureteral access sheath and ureteroscope simultaneously and because the sheath had advanced up the ureter so smoothly and easily I elected not to leave a double-J stent but we'll place the patient on alpha blockade therapy for 5 days.   The patient tolerated the procedure well no intraoperative complications.  PLAN OF CARE: Discharge to home after PACU  PATIENT DISPOSITION:  PACU - hemodynamically stable.

## 2016-09-08 NOTE — Anesthesia Procedure Notes (Signed)
Procedure Name: LMA Insertion Performed by: Andrell Tallman J Pre-anesthesia Checklist: Patient identified, Emergency Drugs available, Suction available, Patient being monitored and Timeout performed Patient Re-evaluated:Patient Re-evaluated prior to inductionOxygen Delivery Method: Circle system utilized Preoxygenation: Pre-oxygenation with 100% oxygen Intubation Type: IV induction Ventilation: Mask ventilation without difficulty LMA: LMA inserted LMA Size: 4.0 Number of attempts: 1 Placement Confirmation: positive ETCO2,  CO2 detector and breath sounds checked- equal and bilateral Tube secured with: Tape Dental Injury: Teeth and Oropharynx as per pre-operative assessment        

## 2016-09-08 NOTE — H&P (Signed)
Regina Richards is a 71 year-old female established patient who is here for a post operative visit.  Denies passing any stone material. States she feels really good and is being discharged today from rehab.    The surgery she had done was cystourethroscopy, (B) RPG, (B) double J stent . Her surgery was done 08/04/2016.   She has not had post operative nausea. She has not had vomiting. She has not had post operative fever. She has not had post operative chills. She does have a good appetite. BOWEL HABITS: her bowels are moving normally.     ALLERGIES: None   MEDICATIONS: Cefpodoxime Proxetil 200 mg tablet  Dulcolax 10 mg suppository, rectal  Florastor 250 mg capsule  Norvasc 10 mg tablet  Paxil 40 mg tablet     GU PSH: Cystoscopy Insert Stent - 08/04/2016    NON-GU PSH: None   GU PMH: None   NON-GU PMH: Depression Hypertension    FAMILY HISTORY: Heart problem - Father   SOCIAL HISTORY: Marital Status: Single Current Smoking Status: Patient has never smoked.   Tobacco Use Assessment Completed: Used Tobacco in last 30 days? Moderate Drinker.  Drinks 1 caffeinated drink per day. Patient's occupation Chiropodist.    REVIEW OF SYSTEMS:    GU Review Female:   Patient denies frequent urination, hard to postpone urination, burning /pain with urination, get up at night to urinate, leakage of urine, stream starts and stops, trouble starting your stream, have to strain to urinate, and currently pregnant.  Gastrointestinal (Upper):   Patient reports vomiting. Patient denies nausea and indigestion/ heartburn.  Gastrointestinal (Lower):   Patient reports diarrhea. Patient denies constipation.  Constitutional:   Patient denies fever, night sweats, weight loss, and fatigue.  Skin:   Patient denies skin rash/ lesion and itching.  Eyes:   Patient denies blurred vision and double vision.  Ears/ Nose/ Throat:   Patient denies sore throat and sinus problems.  Hematologic/Lymphatic:    Patient denies swollen glands and easy bruising.  Cardiovascular:   Patient denies leg swelling and chest pains.  Respiratory:   Patient denies cough and shortness of breath.  Endocrine:   Patient denies excessive thirst.  Musculoskeletal:   Patient denies joint pain and back pain.  Neurological:   Patient denies headaches and dizziness.  Psychologic:   Patient denies depression and anxiety.   VITAL SIGNS:      08/17/2016 08:56 AM Weight 168 lb / 76.2 kg  Height 66 in / 167.64 cm  BP 124/70 mmHg  Pulse 98 /min  Temperature 97.4 F / 36 C  BMI 27.1 kg/m   MULTI-SYSTEM PHYSICAL EXAMINATION:    Constitutional: Well-nourished. No physical deformities. Normally developed. Good grooming.   Respiratory: No labored breathing, no use of accessory muscles.   Cardiovascular: Normal temperature, normal extremity pulses, no swelling, no varicosities.   Neurologic / Psychiatric: Oriented to time, oriented to place, oriented to person. No depression, no anxiety, no agitation.   Gastrointestinal: No mass, no tenderness, no rigidity, non obese abdomen.      PAST DATA REVIEWED:  Source Of History:  Patient  Records Review:   Previous Hospital Records   PROCEDURES:         KUB - 74000  A single view of the abdomen is obtained.      Bilateral ureteral stents in good position. No definitive left ureteral calculus noted along stent. Large right UPJ calculus noted.    ASSESSMENT:      ICD-10  Details  2 GU:   Calculus Kidney and Ureter - N20.2 Bilateral, Discussed with Dr. Karsten Ro and he recommends to proceed with repeat cystourethroscopy, (B) RPG, (B) stone extraction, and possible (B) double J stent exchange. Risks and benefits discussed and pt voices understanding. Will proceed.   1 NON-GU:   Other specified postprocedural states - Z98.89 cystourethroscopy, (B) RPG, (B) double J stent    PLAN: Cysto, bilat stent removal, bilat ureteroscopy, laser litho and poss. bilat stent replacement.

## 2016-09-08 NOTE — Anesthesia Preprocedure Evaluation (Signed)
Anesthesia Evaluation  Patient identified by MRN, date of birth, ID band Patient awake    Reviewed: Allergy & Precautions, NPO status , Patient's Chart, lab work & pertinent test results  History of Anesthesia Complications (+) PONV  Airway Mallampati: I  TM Distance: >3 FB Neck ROM: Full    Dental   Pulmonary    Pulmonary exam normal        Cardiovascular hypertension, Pt. on medications Normal cardiovascular exam     Neuro/Psych    GI/Hepatic   Endo/Other    Renal/GU      Musculoskeletal   Abdominal   Peds  Hematology   Anesthesia Other Findings   Reproductive/Obstetrics                             Anesthesia Physical Anesthesia Plan  ASA: II  Anesthesia Plan: General   Post-op Pain Management:    Induction: Intravenous  Airway Management Planned: LMA  Additional Equipment:   Intra-op Plan:   Post-operative Plan: Extubation in OR  Informed Consent: I have reviewed the patients History and Physical, chart, labs and discussed the procedure including the risks, benefits and alternatives for the proposed anesthesia with the patient or authorized representative who has indicated his/her understanding and acceptance.     Plan Discussed with: CRNA and Surgeon  Anesthesia Plan Comments:         Anesthesia Quick Evaluation

## 2016-09-08 NOTE — Discharge Instructions (Signed)

## 2016-09-08 NOTE — Transfer of Care (Signed)
Immediate Anesthesia Transfer of Care Note  Patient: Regina Richards  Procedure(s) Performed: Procedure(s): BILATERAL CYSTOSCOPY/RETROGRADE/URETEROSCOPY/STONE EXTRACTION / STENT EXCHANGE/ HOLMIUM LASER (Bilateral)  Patient Location: PACU  Anesthesia Type:General  Level of Consciousness: awake, alert  and oriented  Airway & Oxygen Therapy: Patient Spontanous Breathing and Patient connected to face mask oxygen  Post-op Assessment: Report given to RN and Post -op Vital signs reviewed and stable  Post vital signs: Reviewed and stable  Last Vitals:  Vitals:   09/08/16 0914  BP: (!) 160/89  Pulse: 100  Resp: 18  Temp: 36.5 C    Last Pain:  Vitals:   09/08/16 0914  TempSrc: Oral         Complications: No apparent anesthesia complications

## 2016-09-08 NOTE — Anesthesia Postprocedure Evaluation (Signed)
Anesthesia Post Note  Patient: Daune Sobelman Pontillo  Procedure(s) Performed: Procedure(s) (LRB): BILATERAL CYSTOSCOPY/RETROGRADE/URETEROSCOPY/STONE EXTRACTION / STENT EXCHANGE/ HOLMIUM LASER (Bilateral)  Patient location during evaluation: PACU Anesthesia Type: General Level of consciousness: awake and alert Pain management: pain level controlled Vital Signs Assessment: post-procedure vital signs reviewed and stable Respiratory status: spontaneous breathing, nonlabored ventilation, respiratory function stable and patient connected to nasal cannula oxygen Cardiovascular status: blood pressure returned to baseline and stable Postop Assessment: no signs of nausea or vomiting Anesthetic complications: no       Last Vitals:  Vitals:   09/08/16 1315 09/08/16 1336  BP: 137/76 125/72  Pulse: 89 87  Resp: 14 16  Temp: 36.4 C 36.5 C    Last Pain:  Vitals:   09/08/16 1315  TempSrc:   PainSc: 0-No pain                 Jasten Guyette DAVID

## 2016-09-11 ENCOUNTER — Encounter (HOSPITAL_COMMUNITY): Payer: Self-pay | Admitting: Urology

## 2016-09-21 DIAGNOSIS — N202 Calculus of kidney with calculus of ureter: Secondary | ICD-10-CM | POA: Diagnosis not present

## 2016-10-18 DIAGNOSIS — N3080 Other cystitis without hematuria: Secondary | ICD-10-CM | POA: Diagnosis not present

## 2016-11-10 DIAGNOSIS — N200 Calculus of kidney: Secondary | ICD-10-CM | POA: Diagnosis not present

## 2016-11-10 DIAGNOSIS — N3080 Other cystitis without hematuria: Secondary | ICD-10-CM | POA: Diagnosis not present

## 2016-12-12 DIAGNOSIS — N952 Postmenopausal atrophic vaginitis: Secondary | ICD-10-CM | POA: Diagnosis not present

## 2016-12-12 DIAGNOSIS — R300 Dysuria: Secondary | ICD-10-CM | POA: Diagnosis not present

## 2016-12-12 DIAGNOSIS — N3021 Other chronic cystitis with hematuria: Secondary | ICD-10-CM | POA: Diagnosis not present

## 2016-12-29 DIAGNOSIS — N3021 Other chronic cystitis with hematuria: Secondary | ICD-10-CM | POA: Diagnosis not present

## 2017-01-16 DIAGNOSIS — N3021 Other chronic cystitis with hematuria: Secondary | ICD-10-CM | POA: Diagnosis not present

## 2017-01-16 DIAGNOSIS — R300 Dysuria: Secondary | ICD-10-CM | POA: Diagnosis not present

## 2017-02-27 DIAGNOSIS — M546 Pain in thoracic spine: Secondary | ICD-10-CM | POA: Diagnosis not present

## 2017-02-27 DIAGNOSIS — M9903 Segmental and somatic dysfunction of lumbar region: Secondary | ICD-10-CM | POA: Diagnosis not present

## 2017-02-27 DIAGNOSIS — M9902 Segmental and somatic dysfunction of thoracic region: Secondary | ICD-10-CM | POA: Diagnosis not present

## 2017-02-27 DIAGNOSIS — M545 Low back pain: Secondary | ICD-10-CM | POA: Diagnosis not present

## 2017-02-28 DIAGNOSIS — M9903 Segmental and somatic dysfunction of lumbar region: Secondary | ICD-10-CM | POA: Diagnosis not present

## 2017-02-28 DIAGNOSIS — M546 Pain in thoracic spine: Secondary | ICD-10-CM | POA: Diagnosis not present

## 2017-02-28 DIAGNOSIS — M545 Low back pain: Secondary | ICD-10-CM | POA: Diagnosis not present

## 2017-02-28 DIAGNOSIS — M9902 Segmental and somatic dysfunction of thoracic region: Secondary | ICD-10-CM | POA: Diagnosis not present

## 2017-03-20 DIAGNOSIS — Z008 Encounter for other general examination: Secondary | ICD-10-CM | POA: Diagnosis not present

## 2017-04-13 DIAGNOSIS — N952 Postmenopausal atrophic vaginitis: Secondary | ICD-10-CM | POA: Diagnosis not present

## 2017-04-13 DIAGNOSIS — N3021 Other chronic cystitis with hematuria: Secondary | ICD-10-CM | POA: Diagnosis not present

## 2018-07-28 DIAGNOSIS — N390 Urinary tract infection, site not specified: Secondary | ICD-10-CM | POA: Diagnosis not present

## 2018-08-07 ENCOUNTER — Telehealth: Payer: Self-pay | Admitting: Physician Assistant

## 2018-08-07 NOTE — Telephone Encounter (Signed)
Left voicemail we need to know pharmacy

## 2018-08-07 NOTE — Telephone Encounter (Signed)
Patient stated if she ever needed medication to take the edge off you would be ever to prescribe something. She couldn't remember the name. Next appointment 5/20. Chart in your box.

## 2018-08-07 NOTE — Telephone Encounter (Signed)
Please call in Xanax 0.25 mg #30, 1 q8h prn anxiety.  No RF

## 2018-08-08 ENCOUNTER — Other Ambulatory Visit: Payer: Self-pay

## 2018-08-08 MED ORDER — ALPRAZOLAM 0.25 MG PO TABS: 0.2500 mg | ORAL_TABLET | Freq: Three times a day (TID) | ORAL | 0 refills | Status: DC | PRN

## 2018-08-08 NOTE — Telephone Encounter (Signed)
Called back with pharmacy, called into [place]

## 2018-08-09 DIAGNOSIS — R310 Gross hematuria: Secondary | ICD-10-CM | POA: Diagnosis not present

## 2018-08-09 DIAGNOSIS — N200 Calculus of kidney: Secondary | ICD-10-CM | POA: Diagnosis not present

## 2018-08-09 DIAGNOSIS — N3021 Other chronic cystitis with hematuria: Secondary | ICD-10-CM | POA: Diagnosis not present

## 2018-08-19 DIAGNOSIS — K7460 Unspecified cirrhosis of liver: Secondary | ICD-10-CM | POA: Diagnosis not present

## 2018-08-19 DIAGNOSIS — R310 Gross hematuria: Secondary | ICD-10-CM | POA: Diagnosis not present

## 2018-08-27 DIAGNOSIS — R310 Gross hematuria: Secondary | ICD-10-CM | POA: Diagnosis not present

## 2018-09-03 DIAGNOSIS — N200 Calculus of kidney: Secondary | ICD-10-CM | POA: Diagnosis not present

## 2018-09-03 DIAGNOSIS — R310 Gross hematuria: Secondary | ICD-10-CM | POA: Diagnosis not present

## 2018-09-12 ENCOUNTER — Other Ambulatory Visit: Payer: Self-pay | Admitting: Urology

## 2018-09-18 ENCOUNTER — Other Ambulatory Visit: Payer: Self-pay

## 2018-09-18 ENCOUNTER — Encounter (HOSPITAL_BASED_OUTPATIENT_CLINIC_OR_DEPARTMENT_OTHER): Payer: Self-pay | Admitting: *Deleted

## 2018-09-18 NOTE — Progress Notes (Signed)
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

## 2018-09-20 NOTE — H&P (Signed)
HPI: Regina Richards is a 73 year-old female with recurrent right renal calculi.  The problem is on both sides. Her symptoms include blood in urine. Patient denies having flank pain, back pain, groin pain, nausea, vomiting, fever, and chills. She first began experiencing symptoms 07/27/2018. This is not her first kidney stone. Her first stone was 08/03/2016. She has had 2 stones prior to getting this one. She is not currently having flank pain, back pain, groin pain, nausea, vomiting, fever or chills. She has not caught a stone in her urine strainer since her symptoms began.   She has had Ureteral Stent and Ureteroscopy for treatment of her stones in the past.   Calculus disease: On 08/03/16 the patient underwent emergent bilateral stent placement for obstructing left ureteral stone and a right UPJ stone with sepsis. She subsequently underwent ureteroscopic management of her right renal and left ureteral stones on 09/08/16.     CC: I have blood in my urine.  HPI: She first noticed the symptoms 07/27/2018. She did see the blood in her urine. She has not seen blood clots.   She does not have a burning sensation when she urinates. She is not currently having trouble urinating.   She has had kidney stones. She is not having pain. She has not recently had unwanted weight loss.   Her last U/S or CT Scan was 08/19/2018. This condition would be considered of mild to moderate severity with no modifying factors or associated signs or symptoms other than as noted above.   09/03/18: She is doing well. She has no flank pain and has not experienced any further hematuria. She underwent further evaluation of her upper tract with a CT scan.     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 Karsten Ro and he recommends to proceed with repeat cystourethroscopy, (B) RPG, (B) stone extraction, and possible (B) double J stent exchange. Risks and benefits discussed and pt voices understanding. Will proceed. - 08/17/2016    NON-GU PMH: Other specified postprocedural states, cystourethroscopy, (B) RPG, (B) double J stent - 08/17/2016 Depression Hypertension    FAMILY HISTORY: Death - Father, Mother Heart problem - Father   SOCIAL HISTORY: Marital Status: Single Preferred Language: English; Ethnicity: Not Hispanic Or Latino; Race: White Current Smoking Status: Patient has never smoked.   Tobacco Use Assessment Completed: Used Tobacco in last 30 days? Types of alcohol consumed: Beer. Social Drinker.  Drinks 1 caffeinated drink per day. Patient's occupation Chiropodist.    REVIEW OF SYSTEMS:    GU Review Female:   Patient denies frequent urination, hard to postpone urination, burning /pain with urination, get up at night to urinate, leakage of urine, stream starts and stops, trouble starting your stream, have to strain to urinate, and being pregnant.  Gastrointestinal (Upper):   Patient denies nausea, vomiting, and indigestion/ heartburn.  Gastrointestinal (Lower):   Patient denies diarrhea and constipation.  Constitutional:  Patient denies fever, night sweats, weight loss, and fatigue.  Skin:   Patient denies skin rash/ lesion and itching.  Eyes:   Patient denies blurred vision and double vision.  Ears/ Nose/ Throat:   Patient denies sore throat and sinus problems.   Hematologic/Lymphatic:   Patient denies swollen glands and easy bruising.  Cardiovascular:   Patient denies chest pains and leg swelling.  Respiratory:   Patient denies cough and shortness of breath.  Endocrine:   Patient denies excessive thirst.  Musculoskeletal:   Patient denies back pain and joint pain.  Neurological:   Patient denies headaches and dizziness.  Psychologic:   Patient denies depression and anxiety.   VITAL SIGNS:    Weight 160 lb / 72.57 kg  Height 64 in / 162.56 cm  BP 182/92 mmHg  Pulse 115 /min  BMI 27.5 kg/m   MULTI-SYSTEM PHYSICAL EXAMINATION:    Constitutional: Well-nourished. No physical deformities. Normally developed. Good grooming.   Respiratory: No labored breathing, no use of accessory muscles.   Cardiovascular: Normal temperature, normal extremity pulses, no swelling, no varicosities.   Neurologic / Psychiatric: Oriented to time, oriented to place, oriented to person. No depression, no anxiety, no agitation.   Gastrointestinal: No mass, no tenderness, no rigidity, non obese abdomen.  PAST DATA REVIEWED:  Source Of History:  Patient  Lab Test Review:   BUN/Creatinine, Calcium  Records Review:   Previous Patient Records, POC Tool  X-Ray Review: C.T. Abdomen/Pelvis: Reviewed Films. Reviewed Report. Discussed With Patient. IMPRESSION: 1. Hepatic cirrhosis. Faint micronodularity throughout the liver is likely a manifestation of this hepatic cirrhosis. Reactive lymph nodes in the porta hepatis. Portal venous hypertension with a segment 4 portal venous branch extending through the liver capsule to anterior collateral vessels which extend up into the thorax. 2. Numerous bilateral nonobstructive renal calculi. Is mild right hydronephrosis and prominent right collecting system without hydroureter, possibly from a narrowing at the right UPJ. There is also a nonobstructive 5 mm calculus in the right renal collecting system, not currently obstructive. 3. Other imaging  findings of potential clinical significance: Mild cardiomegaly. Left anterior descending coronary artery atherosclerosis. Aortic Atherosclerosis (ICD10-I70.0). Prominent dextroconvex lumbar scoliosis with rotary component and solid interbody fusion of most of the lumbar levels.    08/09/18  General Chemistry  Creatinine 0.6 mg/dL  Notes:                     She was found to have a normal serum calcium in 1/18 of 8.9.   PROCEDURES:          Urinalysis w/Scope Dipstick Dipstick Cont'd Micro  Color: Yellow Bilirubin: Neg mg/dL WBC/hpf: 0 - 5/hpf  Appearance: Cloudy Ketones: Neg mg/dL RBC/hpf: 0 - 2/hpf  Specific Gravity: 1.015 Blood: Neg ery/uL Bacteria: Rare (0-9/hpf)  pH: 7.5 Protein: Trace mg/dL Cystals: Amorph Phosphates  Glucose: Neg mg/dL Urobilinogen: 0.2 mg/dL Casts: NS (Not Seen)    Nitrites: Neg Trichomonas: Not Present    Leukocyte Esterase: 3+ leu/uL Mucous: Not Present      Epithelial Cells: 0 - 5/hpf      Yeast: NS (Not Seen)      Sperm: Not Present    ASSESSMENT/PLAN:      ICD-10 Details  1 GU:   Renal calculus - N20.0 Bilateral, Stable - She was found to have redeveloped bilateral renal calculi with a stone that was nonobstructing in the lower pole of her left kidney, punctate right renal calculi that were peripherally located  and a single stone lying in a dependent portion of a capacious right renal pelvis. The stone measured 5 x 6 mm with Hounsfield units of 1100.  Although she has bilateral renal calculi the stone located in the renal pelvis of her right kidney is what is causing her hematuria and we therefore have discussed proceeding with treatment of this stone. Once treated she will need to undergo a metabolic evaluation.   2   Gross hematuria - R31.0 Stable - We discussed the fact that her hematuria is almost certainly secondary to the stone present in her right renal pelvis. I will evaluate her completely at the time that I dressed her stone with cystoscopy.

## 2018-09-22 NOTE — Anesthesia Preprocedure Evaluation (Addendum)
Anesthesia Evaluation  Patient identified by MRN, date of birth, ID band Patient awake    Reviewed: Allergy & Precautions, H&P , NPO status , Patient's Chart, lab work & pertinent test results  History of Anesthesia Complications (+) PONV  Airway Mallampati: II  TM Distance: >3 FB Neck ROM: Full    Dental no notable dental hx. (+) Teeth Intact, Dental Advisory Given   Pulmonary    Pulmonary exam normal breath sounds clear to auscultation       Cardiovascular Exercise Tolerance: Good hypertension, Normal cardiovascular exam Rhythm:Regular Rate:Normal  09/23/2018 EKG Sr R 98   Neuro/Psych Anxiety negative neurological ROS     GI/Hepatic negative GI ROS, Neg liver ROS,   Endo/Other  negative endocrine ROS  Renal/GU Cr 1.14, K+ 3.2     Musculoskeletal   Abdominal   Peds  Hematology  (+) anemia , Hgb 9.5   Anesthesia Other Findings   Reproductive/Obstetrics                           Lab Results  Component Value Date   CREATININE 1.14 (H) 09/06/2016   BUN 17 09/06/2016   NA 138 09/06/2016   K 3.2 (L) 09/08/2016   CL 105 09/06/2016   CO2 26 09/06/2016    Lab Results  Component Value Date   WBC 8.3 09/06/2016   HGB 9.5 (L) 09/06/2016   HCT 30.6 (L) 09/06/2016   MCV 83.8 09/06/2016   PLT 227 09/06/2016    Anesthesia Physical Anesthesia Plan  ASA: III  Anesthesia Plan: General   Post-op Pain Management:    Induction: Intravenous  PONV Risk Score and Plan: 4 or greater and Treatment may vary due to age or medical condition, Ondansetron and Dexamethasone  Airway Management Planned: LMA  Additional Equipment:   Intra-op Plan:   Post-operative Plan:   Informed Consent: I have reviewed the patients History and Physical, chart, labs and discussed the procedure including the risks, benefits and alternatives for the proposed anesthesia with the patient or authorized  representative who has indicated his/her understanding and acceptance.     Dental advisory given  Plan Discussed with: CRNA  Anesthesia Plan Comments:       Anesthesia Quick Evaluation

## 2018-09-23 ENCOUNTER — Encounter (HOSPITAL_BASED_OUTPATIENT_CLINIC_OR_DEPARTMENT_OTHER): Payer: Self-pay | Admitting: Anesthesiology

## 2018-09-23 ENCOUNTER — Encounter (HOSPITAL_BASED_OUTPATIENT_CLINIC_OR_DEPARTMENT_OTHER)
Admission: RE | Disposition: A | Discharge status: Home / Self Care | Payer: Self-pay | Source: Home / Self Care | Attending: Urology

## 2018-09-23 ENCOUNTER — Ambulatory Visit (HOSPITAL_COMMUNITY): Payer: Medicare Other

## 2018-09-23 ENCOUNTER — Ambulatory Visit (HOSPITAL_BASED_OUTPATIENT_CLINIC_OR_DEPARTMENT_OTHER)
Admission: RE | Admit: 2018-09-23 | Discharge: 2018-09-23 | Disposition: A | Discharge status: Home / Self Care | Payer: Medicare Other | Attending: Urology | Admitting: Urology

## 2018-09-23 ENCOUNTER — Ambulatory Visit (HOSPITAL_BASED_OUTPATIENT_CLINIC_OR_DEPARTMENT_OTHER): Payer: Medicare Other | Admitting: Anesthesiology

## 2018-09-23 ENCOUNTER — Other Ambulatory Visit: Payer: Self-pay

## 2018-09-23 DIAGNOSIS — N200 Calculus of kidney: Secondary | ICD-10-CM | POA: Insufficient documentation

## 2018-09-23 DIAGNOSIS — I10 Essential (primary) hypertension: Secondary | ICD-10-CM | POA: Diagnosis not present

## 2018-09-23 DIAGNOSIS — Z79899 Other long term (current) drug therapy: Secondary | ICD-10-CM | POA: Diagnosis not present

## 2018-09-23 DIAGNOSIS — F329 Major depressive disorder, single episode, unspecified: Secondary | ICD-10-CM | POA: Insufficient documentation

## 2018-09-23 DIAGNOSIS — Z880 Allergy status to penicillin: Secondary | ICD-10-CM | POA: Diagnosis not present

## 2018-09-23 DIAGNOSIS — Z881 Allergy status to other antibiotic agents status: Secondary | ICD-10-CM | POA: Insufficient documentation

## 2018-09-23 DIAGNOSIS — E876 Hypokalemia: Secondary | ICD-10-CM | POA: Diagnosis not present

## 2018-09-23 DIAGNOSIS — Z87442 Personal history of urinary calculi: Secondary | ICD-10-CM | POA: Insufficient documentation

## 2018-09-23 DIAGNOSIS — R310 Gross hematuria: Secondary | ICD-10-CM | POA: Insufficient documentation

## 2018-09-23 DIAGNOSIS — Z882 Allergy status to sulfonamides status: Secondary | ICD-10-CM | POA: Diagnosis not present

## 2018-09-23 HISTORY — PX: CYSTOSCOPY/URETEROSCOPY/HOLMIUM LASER/STENT PLACEMENT: SHX6546

## 2018-09-23 LAB — POCT I-STAT 4, (NA,K, GLUC, HGB,HCT)
Glucose, Bld: 114 mg/dL — ABNORMAL HIGH (ref 70–99)
HCT: 28 % — ABNORMAL LOW (ref 36.0–46.0)
Hemoglobin: 9.5 g/dL — ABNORMAL LOW (ref 12.0–15.0)
Potassium: 3.1 mmol/L — ABNORMAL LOW (ref 3.5–5.1)
Sodium: 144 mmol/L (ref 135–145)

## 2018-09-23 SURGERY — CYSTOSCOPY/URETEROSCOPY/HOLMIUM LASER/STENT PLACEMENT
Anesthesia: General | Site: Ureter | Laterality: Right

## 2018-09-23 MED ORDER — ONDANSETRON HCL 4 MG/2ML IJ SOLN
4.0000 mg | Freq: Once | INTRAMUSCULAR | Status: DC | PRN
  Filled 2018-09-23: qty 2

## 2018-09-23 MED ORDER — PHENYLEPHRINE 40 MCG/ML (10ML) SYRINGE FOR IV PUSH (FOR BLOOD PRESSURE SUPPORT)
PREFILLED_SYRINGE | INTRAVENOUS | Status: DC | PRN
  Administered 2018-09-23: 120 ug via INTRAVENOUS
  Administered 2018-09-23: 80 ug via INTRAVENOUS

## 2018-09-23 MED ORDER — PROPOFOL 10 MG/ML IV BOLUS
INTRAVENOUS | Status: AC
  Filled 2018-09-23: qty 40

## 2018-09-23 MED ORDER — ONDANSETRON HCL 4 MG/2ML IJ SOLN
INTRAMUSCULAR | Status: DC | PRN
  Administered 2018-09-23: 4 mg via INTRAVENOUS

## 2018-09-23 MED ORDER — FENTANYL CITRATE (PF) 100 MCG/2ML IJ SOLN
25.0000 ug | INTRAMUSCULAR | Status: DC | PRN
  Filled 2018-09-23: qty 1

## 2018-09-23 MED ORDER — LIDOCAINE 2% (20 MG/ML) 5 ML SYRINGE
INTRAMUSCULAR | Status: DC | PRN
  Administered 2018-09-23: 100 mg via INTRAVENOUS

## 2018-09-23 MED ORDER — PROPOFOL 10 MG/ML IV BOLUS
INTRAVENOUS | Status: DC | PRN
  Administered 2018-09-23: 150 mg via INTRAVENOUS

## 2018-09-23 MED ORDER — EPHEDRINE 5 MG/ML INJ
INTRAVENOUS | Status: AC
  Filled 2018-09-23: qty 10

## 2018-09-23 MED ORDER — MIDAZOLAM HCL 5 MG/5ML IJ SOLN
INTRAMUSCULAR | Status: DC | PRN
  Administered 2018-09-23: 1 mg via INTRAVENOUS

## 2018-09-23 MED ORDER — ARTIFICIAL TEARS OPHTHALMIC OINT
TOPICAL_OINTMENT | OPHTHALMIC | Status: AC
  Filled 2018-09-23: qty 3.5

## 2018-09-23 MED ORDER — ACETAMINOPHEN 500 MG PO TABS
1000.0000 mg | ORAL_TABLET | Freq: Once | ORAL | Status: AC
  Administered 2018-09-23: 1000 mg via ORAL
  Filled 2018-09-23: qty 2

## 2018-09-23 MED ORDER — FENTANYL CITRATE (PF) 100 MCG/2ML IJ SOLN
INTRAMUSCULAR | Status: AC
  Filled 2018-09-23: qty 2

## 2018-09-23 MED ORDER — HYDROCODONE-ACETAMINOPHEN 10-325 MG PO TABS: 1.0000 | ORAL_TABLET | ORAL | 0 refills | Status: DC | PRN

## 2018-09-23 MED ORDER — PHENYLEPHRINE 40 MCG/ML (10ML) SYRINGE FOR IV PUSH (FOR BLOOD PRESSURE SUPPORT)
PREFILLED_SYRINGE | INTRAVENOUS | Status: AC
  Filled 2018-09-23: qty 10

## 2018-09-23 MED ORDER — PHENAZOPYRIDINE HCL 200 MG PO TABS: 200.0000 mg | ORAL_TABLET | Freq: Three times a day (TID) | ORAL | 0 refills | Status: DC | PRN

## 2018-09-23 MED ORDER — LACTATED RINGERS IV SOLN
INTRAVENOUS | Status: DC
  Administered 2018-09-23: 11:00:00 via INTRAVENOUS
  Administered 2018-09-23: 50 mL/h via INTRAVENOUS
  Filled 2018-09-23: qty 1000

## 2018-09-23 MED ORDER — CIPROFLOXACIN IN D5W 400 MG/200ML IV SOLN
INTRAVENOUS | Status: AC
  Filled 2018-09-23: qty 200

## 2018-09-23 MED ORDER — KETOROLAC TROMETHAMINE 15 MG/ML IJ SOLN
15.0000 mg | Freq: Once | INTRAMUSCULAR | Status: DC | PRN
  Filled 2018-09-23: qty 1

## 2018-09-23 MED ORDER — LIDOCAINE 2% (20 MG/ML) 5 ML SYRINGE
INTRAMUSCULAR | Status: AC
  Filled 2018-09-23: qty 5

## 2018-09-23 MED ORDER — DEXAMETHASONE SODIUM PHOSPHATE 10 MG/ML IJ SOLN
INTRAMUSCULAR | Status: AC
  Filled 2018-09-23: qty 1

## 2018-09-23 MED ORDER — ONDANSETRON HCL 4 MG/2ML IJ SOLN
INTRAMUSCULAR | Status: AC
  Filled 2018-09-23: qty 2

## 2018-09-23 MED ORDER — DEXAMETHASONE SODIUM PHOSPHATE 4 MG/ML IJ SOLN
INTRAMUSCULAR | Status: DC | PRN
  Administered 2018-09-23: 10 mg via INTRAVENOUS

## 2018-09-23 MED ORDER — ACETAMINOPHEN 500 MG PO TABS
ORAL_TABLET | ORAL | Status: AC
  Filled 2018-09-23: qty 2

## 2018-09-23 MED ORDER — IOHEXOL 300 MG/ML  SOLN
INTRAMUSCULAR | Status: DC | PRN
  Administered 2018-09-23: 10 mL via INTRAVENOUS

## 2018-09-23 MED ORDER — MIDAZOLAM HCL 2 MG/2ML IJ SOLN
INTRAMUSCULAR | Status: AC
  Filled 2018-09-23: qty 2

## 2018-09-23 MED ORDER — EPHEDRINE SULFATE-NACL 50-0.9 MG/10ML-% IV SOSY
PREFILLED_SYRINGE | INTRAVENOUS | Status: DC | PRN
  Administered 2018-09-23 (×2): 10 mg via INTRAVENOUS
  Administered 2018-09-23: 15 mg via INTRAVENOUS

## 2018-09-23 MED ORDER — CIPROFLOXACIN IN D5W 400 MG/200ML IV SOLN
400.0000 mg | Freq: Once | INTRAVENOUS | Status: AC
  Administered 2018-09-23: 400 mg via INTRAVENOUS
  Filled 2018-09-23: qty 200

## 2018-09-23 MED ORDER — SODIUM CHLORIDE 0.9 % IR SOLN
Status: DC | PRN
  Administered 2018-09-23: 3000 mL

## 2018-09-23 MED ORDER — FENTANYL CITRATE (PF) 100 MCG/2ML IJ SOLN
INTRAMUSCULAR | Status: DC | PRN
  Administered 2018-09-23: 50 ug via INTRAVENOUS

## 2018-09-23 SURGICAL SUPPLY — 31 items
BAG DRAIN URO-CYSTO SKYTR STRL (DRAIN) ×2 IMPLANT
BAG DRN UROCATH (DRAIN) ×1
BASKET ZERO TIP NITINOL 2.4FR (BASKET) ×1 IMPLANT
BSKT STON RTRVL ZERO TP 2.4FR (BASKET) ×1
CATH INTERMIT  6FR 70CM (CATHETERS) ×1 IMPLANT
CATH URET 5FR 28IN CONE TIP (BALLOONS)
CATH URET 5FR 70CM CONE TIP (BALLOONS) IMPLANT
CATH URET DUAL LUMEN 6-10FR 50 (CATHETERS) ×1 IMPLANT
CLOTH BEACON ORANGE TIMEOUT ST (SAFETY) ×2 IMPLANT
EXTRACTOR STONE 1.7FRX115CM (UROLOGICAL SUPPLIES) IMPLANT
FIBER LASER FLEXIVA 365 (UROLOGICAL SUPPLIES) IMPLANT
FIBER LASER TRAC TIP (UROLOGICAL SUPPLIES) IMPLANT
GLOVE BIO SURGEON STRL SZ7 (GLOVE) ×1 IMPLANT
GLOVE BIO SURGEON STRL SZ8 (GLOVE) ×2 IMPLANT
GLOVE BIOGEL PI IND STRL 7.0 (GLOVE) IMPLANT
GLOVE BIOGEL PI INDICATOR 7.0 (GLOVE) ×1
GOWN STRL REUS W/TWL LRG LVL3 (GOWN DISPOSABLE) ×1 IMPLANT
GOWN STRL REUS W/TWL XL LVL3 (GOWN DISPOSABLE) ×2 IMPLANT
GUIDEWIRE ANG ZIPWIRE 038X150 (WIRE) ×1 IMPLANT
GUIDEWIRE STR DUAL SENSOR (WIRE) ×2 IMPLANT
INFUSOR MANOMETER BAG 3000ML (MISCELLANEOUS) ×2 IMPLANT
IV NS IRRIG 3000ML ARTHROMATIC (IV SOLUTION) ×3 IMPLANT
KIT TURNOVER CYSTO (KITS) ×2 IMPLANT
MANIFOLD NEPTUNE II (INSTRUMENTS) ×1 IMPLANT
NS IRRIG 500ML POUR BTL (IV SOLUTION) ×2 IMPLANT
PACK CYSTO (CUSTOM PROCEDURE TRAY) ×2 IMPLANT
SHEATH URETERAL 12FRX35CM (MISCELLANEOUS) ×1 IMPLANT
STENT POLARIS 5FRX24 (STENTS) ×1 IMPLANT
TUBE CONNECTING 12X1/4 (SUCTIONS) ×1 IMPLANT
TUBING UROLOGY SET (TUBING) ×1 IMPLANT
WATER STERILE IRR 3000ML UROMA (IV SOLUTION) IMPLANT

## 2018-09-23 NOTE — Anesthesia Procedure Notes (Signed)
Procedure Name: LMA Insertion Date/Time: 09/23/2018 9:53 AM Performed by: Barnet Glasgow, MD Pre-anesthesia Checklist: Patient identified, Emergency Drugs available, Suction available and Patient being monitored Patient Re-evaluated:Patient Re-evaluated prior to induction Oxygen Delivery Method: Circle system utilized Preoxygenation: Pre-oxygenation with 100% oxygen Induction Type: IV induction Ventilation: Mask ventilation without difficulty LMA: LMA inserted LMA Size: 4.0 Number of attempts: 1 Airway Equipment and Method: Bite block Placement Confirmation: positive ETCO2 Tube secured with: Tape Dental Injury: Teeth and Oropharynx as per pre-operative assessment

## 2018-09-23 NOTE — Anesthesia Postprocedure Evaluation (Signed)
Anesthesia Post Note  Patient: Regina Richards  Procedure(s) Performed: CYSTOSCOPY/RETROGRADE/URETEROSCOPY/STENT PLACEMENT RIGHT  (Right Ureter)     Patient location during evaluation: PACU Anesthesia Type: General Level of consciousness: awake and alert Pain management: pain level controlled Vital Signs Assessment: post-procedure vital signs reviewed and stable Respiratory status: spontaneous breathing, nonlabored ventilation, respiratory function stable and patient connected to nasal cannula oxygen Cardiovascular status: blood pressure returned to baseline and stable Postop Assessment: no apparent nausea or vomiting Anesthetic complications: no    Last Vitals:  Vitals:   09/23/18 1115 09/23/18 1158  BP: 134/71 (!) 166/81  Pulse: 90 94  Resp: (!) 22 20  Temp:  36.7 C  SpO2: 92% 98%    Last Pain:  Vitals:   09/23/18 1145  TempSrc:   PainSc: 0-No pain   Pain Goal: Patients Stated Pain Goal: 4 (09/23/18 0847)                 Barnet Glasgow

## 2018-09-23 NOTE — Transfer of Care (Signed)
   Last Vitals:  Vitals Value Taken Time  BP    Temp    Pulse 89 09/23/2018 10:38 AM  Resp    SpO2 98 % 09/23/2018 10:38 AM  Vitals shown include unvalidated device data.  Last Pain:  Vitals:   09/23/18 0847  TempSrc:   PainSc: 0-No pain      Patients Stated Pain Goal: 4 (09/23/18 0847) Immediate Anesthesia Transfer of Care Note  Patient: Regina Richards  Procedure(s) Performed: Procedure(s) (LRB): CYSTOSCOPY/RETROGRADE/URETEROSCOPY/STENT PLACEMENT RIGHT  (Right)  Patient Location: PACU  Anesthesia Type: General  Level of Consciousness: awake, alert  and oriented  Airway & Oxygen Therapy: Patient Spontanous Breathing and Patient connected to nasal cannula oxygen  Post-op Assessment: Report given to PACU RN and Post -op Vital signs reviewed and stable  Post vital signs: Reviewed and stable  Complications: No apparent anesthesia complications

## 2018-09-23 NOTE — Discharge Instructions (Signed)

## 2018-09-23 NOTE — Op Note (Signed)
PATIENT:  Regina Richards  PRE-OPERATIVE DIAGNOSIS: 1.  Right renal calculi. 2.  Gross hematuria  POST-OPERATIVE DIAGNOSIS: Same  PROCEDURE:  1. Cystoscopy with right retrograde pyelogram including interpretation. 2. Right ureteroscopy and stone extraction. 3. Right double-J stent placement 4. Fluoroscopy time less than 1 hour  SURGEON: Claybon Jabs, MD  INDICATION: Regina Richards is a 73 year old female who has developed new right renal calculi and has been experiencing gross hematuria.  She presented with gross hematuria and a CT scan was obtained to evaluate this with the finding of a stone in the dependent portion of a capacious right renal pelvis measuring 5 x 6 mm.  Due to the anatomic configuration of her renal pelvis and UPJ I felt ureteroscopic management was indicated as opposed to lithotripsy and she presents today for surgery.  ANESTHESIA:  General  EBL:  Minimal  DRAINS: 5 French, 24 cm Polaris stent (with string, tucked in vagina)  SPECIMEN: Stone given to patient.  DESCRIPTION OF PROCEDURE: The patient was taken to the major OR and placed on the table. General anesthesia was administered and then the patient was moved to the dorsal lithotomy position. The genitalia was sterilely prepped and draped. An official timeout was performed.  Initially the 108 French cystoscope with 30 lens was passed under direct vision into the bladder. The bladder was then fully inspected. It was noted be free of any tumors, stones or inflammatory lesions. Ureteral orifices were of normal configuration and position. A 6 French open-ended ureteral catheter was then passed through the cystoscope into the ureteral orifice in order to perform a right retrograde pyelogram.  A retrograde pyelogram was performed by injecting full-strength contrast up the right ureter under direct fluoroscopic control. It revealed a capacious renal pelvis but the calyces were sharp and the insertion of the ureter did not  appear to be narrow.  The ureter throughout its length was noted to be normal.  I then passed a 0.038 inch floppy-tipped guidewire through the open ended catheter and into the area of the renal pelvis and this was left in place.  A dual-lumen ureteral catheter was then passed over the initial guidewire and a second guidewire was passed with the first being used as a safety guidewire and the dual-lumen catheter was removed.  Over the working guidewire I then proceeded with ureteroscopy after passing a ureteral access sheath over this working guidewire into the area of the renal pelvis and removing the inner portion of the access sheath.  A 6 French flexible ureteroscope was then passed through the access sheath and into the area of the renal pelvis.  The renal pelvis was fully inspected and noted to be free of any lesions.  I did not see any stones initially.  I then performed a systematic inspection starting at the upper pole and noted no stones but several Randall's plaques were identified.  The middle pole was inspected and a small stone was identified.  I then used the 0 tip nitinol basket to grasp the stone and remove it.  I then found 2 stones in the lower pole calyces.  The smaller stone was grasped with the basket and removed and then the larger stone was grasped and I was able to remove it without difficulty.  Reinspection of the collecting system revealed no further stones present.  I then removed the ureteroscope and access sheath.  Over the working guidewire I passed the stent and positioned this in the renal pelvis.  Good curl  was noted as the guidewire was removed and I then tucked the string affixed to the distal aspect of the stent, and shortened somewhat, into the vagina.  The patient tolerated the procedure well no intraoperative complications.  PLAN OF CARE: Discharge to home after PACU  PATIENT DISPOSITION:  PACU - hemodynamically stable.

## 2018-09-24 ENCOUNTER — Encounter (HOSPITAL_BASED_OUTPATIENT_CLINIC_OR_DEPARTMENT_OTHER): Payer: Self-pay | Admitting: Urology

## 2018-10-01 ENCOUNTER — Encounter: Payer: Self-pay | Admitting: Emergency Medicine

## 2018-10-01 DIAGNOSIS — F411 Generalized anxiety disorder: Secondary | ICD-10-CM | POA: Insufficient documentation

## 2018-10-11 DIAGNOSIS — N200 Calculus of kidney: Secondary | ICD-10-CM | POA: Diagnosis not present

## 2018-12-31 ENCOUNTER — Ambulatory Visit: Payer: Self-pay | Admitting: Physician Assistant

## 2019-01-20 ENCOUNTER — Other Ambulatory Visit: Payer: Self-pay | Admitting: Physician Assistant

## 2019-02-04 ENCOUNTER — Ambulatory Visit: Payer: Self-pay | Admitting: Physician Assistant

## 2019-02-06 ENCOUNTER — Telehealth: Payer: Self-pay | Admitting: Physician Assistant

## 2019-02-06 NOTE — Telephone Encounter (Signed)
Patient had a refill called in for Paxil was not able to pick up script please re-send refill to [facility] on [address]

## 2019-03-26 ENCOUNTER — Other Ambulatory Visit: Payer: Self-pay

## 2019-03-26 ENCOUNTER — Telehealth: Payer: Self-pay | Admitting: Physician Assistant

## 2019-03-26 MED ORDER — ALPRAZOLAM 0.25 MG PO TABS: 0.2500 mg | ORAL_TABLET | Freq: Three times a day (TID) | ORAL | 0 refills | Status: DC | PRN

## 2019-03-26 NOTE — Telephone Encounter (Signed)
Pt needs refill on xanax sent to [facility] on [address]

## 2019-03-26 NOTE — Telephone Encounter (Signed)
Pt last fill 08/08/2018 #30 Last office visit 12/2017, pt normally comes yearly. Pt did schedule appt for Septembet with Helene Kelp.  Will submit to [facility] per request.

## 2019-05-07 ENCOUNTER — Other Ambulatory Visit: Payer: Self-pay

## 2019-05-07 ENCOUNTER — Ambulatory Visit (INDEPENDENT_AMBULATORY_CARE_PROVIDER_SITE_OTHER): Payer: Medicare Other | Admitting: Physician Assistant

## 2019-05-07 ENCOUNTER — Encounter: Payer: Self-pay | Admitting: Physician Assistant

## 2019-05-07 DIAGNOSIS — F411 Generalized anxiety disorder: Secondary | ICD-10-CM | POA: Diagnosis not present

## 2019-05-07 MED ORDER — ALPRAZOLAM 0.25 MG PO TABS: 0.2500 mg | ORAL_TABLET | Freq: Three times a day (TID) | ORAL | 5 refills | Status: DC | PRN

## 2019-05-07 MED ORDER — PAROXETINE HCL 40 MG PO TABS: 40.0000 mg | ORAL_TABLET | Freq: Every morning | ORAL | 1 refills | Status: DC

## 2019-05-07 NOTE — Progress Notes (Signed)
[facility]  Patient ID: Regina Richards,  MRN: [id number]  PCP: Patient, No Pcp Per  Date of Evaluation: 05/07/2019 Time spent:15 minutes  Chief Complaint:  Chief Complaint    Depression; Anxiety; Follow-up     Virtual Visit via Telephone Note  I connected with patient by a video enabled telemedicine application or telephone, with their informed consent, and verified patient privacy and that I am speaking with the correct person using two identifiers.  I am private, in my home and the patient is home.   I discussed the limitations, risks, security and privacy concerns of performing an evaluation and management service by telephone and the availability of in person appointments. I also discussed with the patient that there may be a patient responsible charge related to this service. The patient expressed understanding and agreed to proceed.   I discussed the assessment and treatment plan with the patient. The patient was provided an opportunity to ask questions and all were answered. The patient agreed with the plan and demonstrated an understanding of the instructions.   The patient was advised to call back or seek an in-person evaluation if the symptoms worsen or if the condition fails to improve as anticipated.  I provided 15 minutes of non-face-to-face time during this encounter.  HISTORY/CURRENT STATUS: HPI For routine med check.  Anxious b/c of the current situation with the pandemic, social unrest, etc.  She does take the Xanax on occasion but does not want to rely on it.  Is also concerned about getting addicted.  She does use it sometimes to help her relax so that she can get drowsy enough to go to sleep.  States that being on the Paxil has really helped the anxiety a lot and she is nowhere near as anxious as she was prior to starting that.  She sleeps well most of the time.  Patient denies loss of interest in usual activities and is able to enjoy things.  Denies  decreased energy or motivation.  Appetite has not changed.  No extreme sadness, tearfulness, or feelings of hopelessness.  Denies any changes in concentration, making decisions or remembering things.  Denies suicidal or homicidal thoughts.  Denies dizziness, syncope, seizures, numbness, tingling, tremor, tics, unsteady gait, slurred speech, confusion. Denies muscle or joint pain, stiffness, or dystonia.  Individual Medical History/ Review of Systems: Changes? :No    Past medications for mental health diagnoses include: unknown  Allergies: Sulfur  Current Medications:  Current Outpatient Medications:  .  ALPRAZolam (XANAX) 0.25 MG tablet, Take 1 tablet (0.25 mg total) by mouth every 8 (eight) hours as needed for anxiety., Disp: 30 tablet, Rfl: 5 .  PARoxetine (PAXIL) 40 MG tablet, Take 1 tablet (40 mg total) by mouth every morning., Disp: 90 tablet, Rfl: 1 .  UNABLE TO FIND, Juice plus 2 capsules per day, Disp: , Rfl:  .  HYDROcodone-acetaminophen (NORCO) 10-325 MG tablet, Take 1-2 tablets by mouth every 4 (four) hours as needed for moderate pain. Maximum dose per 24 hours - 8 pills (Patient not taking: Reported on 05/07/2019), Disp: 8 tablet, Rfl: 0 .  phenazopyridine (PYRIDIUM) 200 MG tablet, Take 1 tablet (200 mg total) by mouth 3 (three) times daily as needed for pain. (Patient not taking: Reported on 05/07/2019), Disp: 10 tablet, Rfl: 0 Medication Side Effects: none  Family Medical/ Social History: Changes? No  MENTAL HEALTH EXAM:  There were no vitals taken for this visit.There is no height or weight on file to calculate  BMI.  General Appearance: Unable to assess  Eye Contact:  Unable to assess  Speech:  Clear and Coherent  Volume:  Normal  Mood:  Euthymic  Affect:  Unable to assess  Thought Process:  Goal Directed  Orientation:  Full (Time, Place, and Person)  Thought Content: Logical   Suicidal Thoughts:  No  Homicidal Thoughts:  No  Memory:  WNL  Judgement:  Good  Insight:   Good  Psychomotor Activity:  Unable to assess  Concentration:  Concentration: Good  Recall:  Good  Fund of Knowledge: Good  Language: Good  Assets:  Desire for Improvement  ADL's:  Intact  Cognition: WNL  Prognosis:  Good    DIAGNOSES:    ICD-10-CM   1. Generalized anxiety disorder  F41.1     Receiving Psychotherapy: No    RECOMMENDATIONS:  PDMP was reviewed. We discussed the benefits, risks, and side effects of the benzodiazepines, including the increased risk of dementia for taking high doses for long periods of time, which she has not done.  I recommend that she not hesitate to take the Xanax for emergencies.  Of course, use other coping skills first but if she is unable to calm down and relax, it is okay to take the Xanax.  We should also consider increasing the Paxil if she is needing the Xanax too often. Continue Xanax 0.25 mg 1 nightly 8H as needed anxiety. Continue Paxil 40 mg 1 every morning. Return in 3 months.  Donnal Moat, PA-C   This record has been created using [employer].  Chart creation errors have been sought, but may not always have been located and corrected. Such creation errors do not reflect on the standard of medical care.

## 2019-05-18 ENCOUNTER — Other Ambulatory Visit: Payer: Self-pay | Admitting: Physician Assistant

## 2019-05-28 ENCOUNTER — Telehealth: Payer: Self-pay | Admitting: Physician Assistant

## 2019-05-28 NOTE — Telephone Encounter (Signed)
Patient did not get script for Paxil filled had car trouble please submit a new script to [facility] on [address]

## 2019-05-28 NOTE — Telephone Encounter (Signed)
Refills for Paxil submitted 05/07/2019 for 6 month supply to [facility] on [address]. Pt to contact her pharmacy

## 2019-10-13 ENCOUNTER — Telehealth: Payer: Self-pay | Admitting: Physician Assistant

## 2019-10-13 NOTE — Telephone Encounter (Signed)
Contacted [facility] pharmacy, patient has 5 refills available on her Xanax and 1 90 day refill left on Paroxetine.

## 2019-10-13 NOTE — Telephone Encounter (Signed)
Patient called and said that she needs a refill on her xanax and paxil to be sent to the [facility] on [address]. She has an appointment on 3/4

## 2019-10-30 ENCOUNTER — Ambulatory Visit: Payer: Medicare Other | Admitting: Physician Assistant

## 2019-11-11 ENCOUNTER — Ambulatory Visit: Payer: Medicare Other | Admitting: Physician Assistant

## 2019-11-20 ENCOUNTER — Ambulatory Visit: Payer: Medicare Other | Admitting: Physician Assistant

## 2020-01-17 ENCOUNTER — Other Ambulatory Visit: Payer: Self-pay | Admitting: Physician Assistant

## 2020-01-19 NOTE — Telephone Encounter (Signed)
Last apt 04/2019 overdue for f/u

## 2020-03-12 ENCOUNTER — Other Ambulatory Visit: Payer: Self-pay | Admitting: Physician Assistant

## 2020-03-16 ENCOUNTER — Telehealth: Payer: Self-pay | Admitting: Physician Assistant

## 2020-03-16 ENCOUNTER — Other Ambulatory Visit: Payer: Self-pay

## 2020-03-16 MED ORDER — PAROXETINE HCL 40 MG PO TABS: ORAL_TABLET | ORAL | 0 refills | Status: DC

## 2020-03-16 NOTE — Telephone Encounter (Signed)
Pt requesting a refill on her Paxil. Fill at the [facility] and [address]. Appt scheduled for 8/6.

## 2020-03-16 NOTE — Telephone Encounter (Signed)
Rx sent 

## 2020-04-02 ENCOUNTER — Telehealth: Payer: Self-pay | Admitting: Physician Assistant

## 2020-04-02 ENCOUNTER — Telehealth (INDEPENDENT_AMBULATORY_CARE_PROVIDER_SITE_OTHER): Payer: Medicare Other | Admitting: Physician Assistant

## 2020-04-02 ENCOUNTER — Encounter: Payer: Self-pay | Admitting: Physician Assistant

## 2020-04-02 DIAGNOSIS — F411 Generalized anxiety disorder: Secondary | ICD-10-CM

## 2020-04-02 DIAGNOSIS — F418 Other specified anxiety disorders: Secondary | ICD-10-CM | POA: Diagnosis not present

## 2020-04-02 MED ORDER — ALPRAZOLAM 0.25 MG PO TABS: 0.2500 mg | ORAL_TABLET | Freq: Three times a day (TID) | ORAL | 5 refills | Status: DC | PRN

## 2020-04-02 MED ORDER — PAROXETINE HCL 40 MG PO TABS: ORAL_TABLET | ORAL | 3 refills | Status: DC

## 2020-04-02 NOTE — Progress Notes (Signed)
[facility]  Patient ID: Regina Richards,  MRN: [id number]  PCP: Patient, No Pcp Per  Date of Evaluation: 04/02/2020 Time spent:20 minutes  Chief Complaint:  Chief Complaint    Follow-up     Virtual Visit via Telehealth  I connected with patient by a video enabled telemedicine application, with their informed consent, and verified patient privacy and that I am speaking with the correct person using two identifiers.  I am private, in my office and the patient is at home.  I discussed the limitations, risks, security and privacy concerns of performing an evaluation and management service by video and the availability of in person appointments. I also discussed with the patient that there may be a patient responsible charge related to this service. The patient expressed understanding and agreed to proceed.   I discussed the assessment and treatment plan with the patient. The patient was provided an opportunity to ask questions and all were answered. The patient agreed with the plan and demonstrated an understanding of the instructions.   The patient was advised to call back or seek an in-person evaluation if the symptoms worsen or if the condition fails to improve as anticipated.  I provide 20 minutes of non-face-to-face time during this encounter.  HISTORY/CURRENT STATUS: HPI For annual med check.  Has been more anxious at times.  "There's so much bad stuff going on in the world, it just makes me anxious."  She does not have any Xanax now but there are times when she could use one.  She never took them often anyway, but they are helpful when needed.  She can just get overwhelmed with things going on in life.  It is more of a generalized sense of anxiety, not so much panic attacks.  She will get kind of jittery on the inside when she hears about bad things.  Patient denies loss of interest in usual activities and is able to enjoy things.  Denies decreased energy or motivation.   Appetite has not changed.  No extreme sadness, tearfulness, or feelings of hopelessness.  Denies any changes in concentration, making decisions or remembering things.  She does have trouble sleeping sometimes.  She has a hard time getting her thoughts to quiet down so she can go to sleep.  "And of course I am older and sometimes age plays a part."  Denies suicidal or homicidal thoughts.  Denies dizziness, syncope, seizures, numbness, tingling, tremor, tics, unsteady gait, slurred speech, confusion. Denies muscle or joint pain, stiffness, or dystonia.  Individual Medical History/ Review of Systems: Changes? :No    Past medications for mental health diagnoses include: Melatonin wasn't effective.  Allergies: Sulfur  Current Medications:  Current Outpatient Medications:  .  PARoxetine (PAXIL) 40 MG tablet, TAKE 1 TABLET(40 MG) BY MOUTH EVERY MORNING, Disp: 90 tablet, Rfl: 3 .  UNABLE TO FIND, Juice plus 2 capsules per day, Disp: , Rfl:  .  ALPRAZolam (XANAX) 0.25 MG tablet, Take 1 tablet (0.25 mg total) by mouth every 8 (eight) hours as needed for anxiety., Disp: 30 tablet, Rfl: 5 .  HYDROcodone-acetaminophen (NORCO) 10-325 MG tablet, Take 1-2 tablets by mouth every 4 (four) hours as needed for moderate pain. Maximum dose per 24 hours - 8 pills (Patient not taking: Reported on 05/07/2019), Disp: 8 tablet, Rfl: 0 .  phenazopyridine (PYRIDIUM) 200 MG tablet, Take 1 tablet (200 mg total) by mouth 3 (three) times daily as needed for pain. (Patient not taking: Reported on 05/07/2019), Disp: 10  tablet, Rfl: 0 Medication Side Effects: none  Family Medical/ Social History: Changes? No  MENTAL HEALTH EXAM:  There were no vitals taken for this visit.There is no height or weight on file to calculate BMI.  General Appearance: Unable to assess  Eye Contact:  Unable to assess  Speech:  Clear and Coherent and Normal Rate  Volume:  Normal  Mood:  Euthymic  Affect:  Unable to assess  Thought Process:  Goal  Directed and Descriptions of Associations: Intact  Orientation:  Full (Time, Place, and Person)  Thought Content: Logical   Suicidal Thoughts:  No  Homicidal Thoughts:  No  Memory:  WNL  Judgement:  Good  Insight:  Good  Psychomotor Activity:  Unable to assess  Concentration:  Concentration: Good and Attention Span: Good  Recall:  Good  Fund of Knowledge: Good  Language: Good  Assets:  Desire for Improvement  ADL's:  Intact  Cognition: WNL  Prognosis:  Good    DIAGNOSES:    ICD-10-CM   1. Generalized anxiety disorder  F41.1   2. Situational anxiety  F41.8     Receiving Psychotherapy: No    RECOMMENDATIONS:  PDMP was reviewed. I provided 20 minutes of non face to face time during this encounter. Discussed the anxiety.  Fall risk w/ Xanax in the elderly but she has taken it so rarely and at a low dose, I believe she should have it to take prn. She understands and would like to have it available.  The other option for treatment would be increasing the Paxil or adding Buspar. We agree to Xanax prn. Continue Xanax 0.25 mg 1 nightly 8H as needed anxiety. Continue Paxil 40 mg 1 every morning. Return in 3 months.  Donnal Moat, PA-C

## 2020-04-02 NOTE — Telephone Encounter (Signed)
Ms. genna, casimir are scheduled for a virtual visit with your provider today.    Just as we do with appointments in the office, we must obtain your consent to participate.  Your consent will be active for this visit and any virtual visit you may have with one of our providers in the next 365 days.    If you have a MyChart account, I can also send a copy of this consent to you electronically.  All virtual visits are billed to your insurance company just like a traditional visit in the office.  As this is a virtual visit, video technology does not allow for your provider to perform a traditional examination.  This may limit your provider's ability to fully assess your condition.  If your provider identifies any concerns that need to be evaluated in person or the need to arrange testing such as labs, EKG, etc, we will make arrangements to do so.    Although advances in technology are sophisticated, we cannot ensure that it will always work on either your end or our end.  If the connection with a video visit is poor, we may have to switch to a telephone visit.  With either a video or telephone visit, we are not always able to ensure that we have a secure connection.   I need to obtain your verbal consent now.   Are you willing to proceed with your visit today?   Laretta Pyatt Ostlund has provided verbal consent on 04/02/2020 for a virtual visit (video or telephone).   Donnal Moat, PA-C 04/02/2020  4:00 PM

## 2020-04-12 DIAGNOSIS — M9903 Segmental and somatic dysfunction of lumbar region: Secondary | ICD-10-CM | POA: Diagnosis not present

## 2020-04-12 DIAGNOSIS — M546 Pain in thoracic spine: Secondary | ICD-10-CM | POA: Diagnosis not present

## 2020-04-12 DIAGNOSIS — M9902 Segmental and somatic dysfunction of thoracic region: Secondary | ICD-10-CM | POA: Diagnosis not present

## 2020-04-12 DIAGNOSIS — M542 Cervicalgia: Secondary | ICD-10-CM | POA: Diagnosis not present

## 2020-04-12 DIAGNOSIS — M9901 Segmental and somatic dysfunction of cervical region: Secondary | ICD-10-CM | POA: Diagnosis not present

## 2020-04-12 DIAGNOSIS — M545 Low back pain: Secondary | ICD-10-CM | POA: Diagnosis not present

## 2020-04-13 DIAGNOSIS — M9901 Segmental and somatic dysfunction of cervical region: Secondary | ICD-10-CM | POA: Diagnosis not present

## 2020-04-13 DIAGNOSIS — M546 Pain in thoracic spine: Secondary | ICD-10-CM | POA: Diagnosis not present

## 2020-04-13 DIAGNOSIS — M545 Low back pain: Secondary | ICD-10-CM | POA: Diagnosis not present

## 2020-04-13 DIAGNOSIS — M62838 Other muscle spasm: Secondary | ICD-10-CM | POA: Diagnosis not present

## 2020-04-13 DIAGNOSIS — M9902 Segmental and somatic dysfunction of thoracic region: Secondary | ICD-10-CM | POA: Diagnosis not present

## 2020-04-13 DIAGNOSIS — M9903 Segmental and somatic dysfunction of lumbar region: Secondary | ICD-10-CM | POA: Diagnosis not present

## 2020-04-13 DIAGNOSIS — M542 Cervicalgia: Secondary | ICD-10-CM | POA: Diagnosis not present

## 2020-04-13 DIAGNOSIS — M62830 Muscle spasm of back: Secondary | ICD-10-CM | POA: Diagnosis not present

## 2020-04-29 ENCOUNTER — Other Ambulatory Visit: Payer: Self-pay | Admitting: Physician Assistant

## 2020-04-29 DIAGNOSIS — N202 Calculus of kidney with calculus of ureter: Secondary | ICD-10-CM | POA: Diagnosis not present

## 2020-04-29 DIAGNOSIS — N3021 Other chronic cystitis with hematuria: Secondary | ICD-10-CM | POA: Diagnosis not present

## 2020-04-29 NOTE — Telephone Encounter (Signed)
Please send if appropriate.

## 2020-04-29 NOTE — Progress Notes (Signed)
Cardiology Office Note:    Date:  05/13/2020   ID:  Regina, Richards 1945-11-05, MRN [id number]  PCP:  Patient, No Pcp Per  Cardiologist:  No primary care provider on file.  Electrophysiologist:  None   Referring MD: No ref. provider found   Chief Complaint  Patient presents with  . Shortness of Breath    History of Present Illness:    Regina Richards is a 74 y.o. female with a hx of hypertension who presents for evaluation of shortness of breath.  She reports that starting several weeks ago she began to have leg and arm pain when she stands and starts walking.  States that she is comfortable when she sits down.  Has diffuse weakness.  She denies any chest pain.  States that she feels short of breath with minimal exertion.  Has been having episodes of lightheadedness and this morning had a syncopal episode.  States that she was getting into the back seat of her car when she lost consciousness.  Sisters states that she was unconscious for about 2 minutes.  Sister observed convulsions during that time.  EMS was called but patient did not wish to go to the ED at that time.  States that she saw her urologist 2 days ago and was supposed to be started on antibiotic for UTI but did not start taking yet.   Past Medical History:  Diagnosis Date  . Anxiety   . Arthritis   . Dysrhythmia    hx of murmur   . History of kidney stones   . Hypertension    off bp meds " for a while"  . Pneumonia last 10 yrs ago   hx of  . PONV (postoperative nausea and vomiting)    hx of    Past Surgical History:  Procedure Laterality Date  . CYSTOSCOPY W/ URETERAL STENT PLACEMENT Bilateral 08/03/2016   Procedure: CYSTOSCOPY WITH BILATERAL STENT REPLACEMENT, BILATERAL RETROGRADE PYELOGRAM;  Surgeon: Kathie Rhodes, MD;  Location: [facility];  Service: Urology;  Laterality: Bilateral;  . CYSTOSCOPY/RETROGRADE/URETEROSCOPY/STONE EXTRACTION WITH BASKET Bilateral 09/08/2016   Procedure: BILATERAL  CYSTOSCOPY/RETROGRADE/URETEROSCOPY/STONE EXTRACTION / HOLMIUM LASER /STENT REMOVAL;  Surgeon: Kathie Rhodes, MD;  Location: [facility];  Service: Urology;  Laterality: Bilateral;  . CYSTOSCOPY/URETEROSCOPY/HOLMIUM LASER/STENT PLACEMENT Right 09/23/2018   Procedure: CYSTOSCOPY/RETROGRADE/URETEROSCOPY/STENT PLACEMENT RIGHT ;  Surgeon: Kathie Rhodes, MD;  Location: [facility];  Service: Urology;  Laterality: Right;  . polyp removed from uterus      Current Medications: Current Meds  Medication Sig  . PARoxetine (PAXIL) 40 MG tablet TAKE 1 TABLET(40 MG) BY MOUTH EVERY MORNING  . [DISCONTINUED] phenazopyridine (PYRIDIUM) 200 MG tablet Take 1 tablet (200 mg total) by mouth 3 (three) times daily as needed for pain.  . [DISCONTINUED] UNABLE TO FIND Juice plus 2 capsules per day     Allergies:   Sulfur   Social History   Socioeconomic History  . Marital status: Divorced    Spouse name: Not on file  . Number of children: Not on file  . Years of education: Not on file  . Highest education level: Not on file  Occupational History  . Not on file  Tobacco Use  . Smoking status: Never Smoker  . Smokeless tobacco: Never Used  Vaping Use  . Vaping Use: Never used  Substance and Sexual Activity  . Alcohol use: Yes    Alcohol/week: 5.0 standard drinks    Types: 5 Cans of beer per week  .  Drug use: No  . Sexual activity: Never  Other Topics Concern  . Not on file  Social History Narrative  . Not on file   Social Determinants of Health   Financial Resource Strain:   . Difficulty of Paying Living Expenses: Not on file  Food Insecurity:   . Worried About Charity fundraiser in the Last Year: Not on file  . Ran Out of Food in the Last Year: Not on file  Transportation Needs:   . Lack of Transportation (Medical): Not on file  . Lack of Transportation (Non-Medical): Not on file  Physical Activity:   . Days of Exercise per Week: Not on file  . Minutes of Exercise per Session: Not  on file  Stress:   . Feeling of Stress : Not on file  Social Connections:   . Frequency of Communication with Friends and Family: Not on file  . Frequency of Social Gatherings with Friends and Family: Not on file  . Attends Religious Services: Not on file  . Active Member of Clubs or Organizations: Not on file  . Attends Archivist Meetings: Not on file  . Marital Status: Not on file     Family History: The patient's family history is not on file.  ROS:   Please see the history of present illness.     All other systems reviewed and are negative.  EKGs/Labs/Other Studies Reviewed:    The following studies were reviewed today:   EKG:  EKG is ordered today.  The ekg ordered today demonstrates normal sinus rhythm, rate 88, nonspecific T wave flattening, diffuse subtle ST depression  Recent Labs: No results found for requested labs within last 8760 hours.  Recent Lipid Panel No results found for: CHOL, TRIG, HDL, CHOLHDL, VLDL, LDLCALC, LDLDIRECT  Physical Exam:    VS:  BP 126/62   Pulse 88   Ht 5\' 4"  (1.626 m)   Wt 173 lb 6.4 oz (78.7 kg)   LMP  (LMP Unknown)   BMI 29.76 kg/m     Wt Readings from Last 3 Encounters:  05/26/2020 173 lb 6.4 oz (78.7 kg)  09/23/18 174 lb 2.6 oz (79 kg)  08/09/16 185 lb 10 oz (84.2 kg)     GEN:  in no acute distress HEENT: Normal NECK: + JVD LYMPHATICS: No lymphadenopathy CARDIAC: RRR, 2 out of 6 systolic murmur RESPIRATORY:  Clear to auscultation without rales, wheezing or rhonchi  ABDOMEN: Soft, non-tender, non-distended MUSCULOSKELETAL:  Trace edema SKIN: Warm and dry NEUROLOGIC:  Alert and oriented x 3 PSYCHIATRIC:  Normal affect   ASSESSMENT:    1. Syncope and collapse   2. SOB (shortness of breath)   3. Weakness    PLAN:    Syncope: She reports diffuse weakness over the last several weeks and having dyspnea with minimal exertion.  No chest pain.  Has been having lightheadedness, and this morning had a syncopal  episode.  Sister reported she was unconscious for about 2 minutes and having convulsions.  Unclear etiology, could be convulsive syncope versus seizures.  Does report she was recently told she had a UTI but has not started treatment.  Recommend evaluation in ED.  From cardiac standpoint, would recommend checking echocardiogram and monitoring on telemetry.   Medication Adjustments/Labs and Tests Ordered: Current medicines are reviewed at length with the patient today.  Concerns regarding medicines are outlined above.  Orders Placed This Encounter  Procedures  . EKG 12-Lead   No orders of the  defined types were placed in this encounter.   Patient Instructions  Please proceed to the ER to be evaluated     Signed, Donato Heinz, MD  05/15/2020 12:45 PM    [place]

## 2020-04-30 ENCOUNTER — Inpatient Hospital Stay (HOSPITAL_COMMUNITY)
Admission: EM | Admit: 2020-04-30 | Discharge: 2020-05-28 | DRG: 329 | Disposition: E | Payer: Medicare Other | Source: Ambulatory Visit | Attending: Internal Medicine | Admitting: Internal Medicine

## 2020-04-30 ENCOUNTER — Emergency Department (HOSPITAL_COMMUNITY): Payer: Medicare Other

## 2020-04-30 ENCOUNTER — Inpatient Hospital Stay (HOSPITAL_COMMUNITY): Payer: Medicare Other

## 2020-04-30 ENCOUNTER — Ambulatory Visit (INDEPENDENT_AMBULATORY_CARE_PROVIDER_SITE_OTHER): Payer: Medicare Other | Admitting: Cardiology

## 2020-04-30 ENCOUNTER — Other Ambulatory Visit: Payer: Self-pay

## 2020-04-30 ENCOUNTER — Encounter: Payer: Self-pay | Admitting: Cardiology

## 2020-04-30 VITALS — BP 126/62 | HR 88 | Ht 64.0 in | Wt 173.4 lb

## 2020-04-30 DIAGNOSIS — A419 Sepsis, unspecified organism: Secondary | ICD-10-CM | POA: Diagnosis not present

## 2020-04-30 DIAGNOSIS — I10 Essential (primary) hypertension: Secondary | ICD-10-CM | POA: Diagnosis not present

## 2020-04-30 DIAGNOSIS — R0609 Other forms of dyspnea: Secondary | ICD-10-CM | POA: Diagnosis not present

## 2020-04-30 DIAGNOSIS — F419 Anxiety disorder, unspecified: Secondary | ICD-10-CM

## 2020-04-30 DIAGNOSIS — K660 Peritoneal adhesions (postprocedural) (postinfection): Secondary | ICD-10-CM | POA: Diagnosis not present

## 2020-04-30 DIAGNOSIS — E46 Unspecified protein-calorie malnutrition: Secondary | ICD-10-CM | POA: Diagnosis present

## 2020-04-30 DIAGNOSIS — R0602 Shortness of breath: Secondary | ICD-10-CM

## 2020-04-30 DIAGNOSIS — R140 Abdominal distension (gaseous): Secondary | ICD-10-CM | POA: Diagnosis not present

## 2020-04-30 DIAGNOSIS — R030 Elevated blood-pressure reading, without diagnosis of hypertension: Secondary | ICD-10-CM | POA: Diagnosis present

## 2020-04-30 DIAGNOSIS — K668 Other specified disorders of peritoneum: Secondary | ICD-10-CM | POA: Diagnosis not present

## 2020-04-30 DIAGNOSIS — Z9889 Other specified postprocedural states: Secondary | ICD-10-CM

## 2020-04-30 DIAGNOSIS — R188 Other ascites: Secondary | ICD-10-CM | POA: Diagnosis not present

## 2020-04-30 DIAGNOSIS — T8143XA Infection following a procedure, organ and space surgical site, initial encounter: Secondary | ICD-10-CM | POA: Diagnosis not present

## 2020-04-30 DIAGNOSIS — R0789 Other chest pain: Secondary | ICD-10-CM | POA: Diagnosis not present

## 2020-04-30 DIAGNOSIS — J9602 Acute respiratory failure with hypercapnia: Secondary | ICD-10-CM | POA: Diagnosis not present

## 2020-04-30 DIAGNOSIS — R627 Adult failure to thrive: Secondary | ICD-10-CM | POA: Diagnosis not present

## 2020-04-30 DIAGNOSIS — N1832 Chronic kidney disease, stage 3b: Secondary | ICD-10-CM | POA: Diagnosis present

## 2020-04-30 DIAGNOSIS — R64 Cachexia: Secondary | ICD-10-CM | POA: Diagnosis not present

## 2020-04-30 DIAGNOSIS — R55 Syncope and collapse: Secondary | ICD-10-CM | POA: Diagnosis present

## 2020-04-30 DIAGNOSIS — C187 Malignant neoplasm of sigmoid colon: Principal | ICD-10-CM | POA: Diagnosis present

## 2020-04-30 DIAGNOSIS — K56600 Partial intestinal obstruction, unspecified as to cause: Secondary | ICD-10-CM | POA: Diagnosis not present

## 2020-04-30 DIAGNOSIS — J95821 Acute postprocedural respiratory failure: Secondary | ICD-10-CM | POA: Diagnosis not present

## 2020-04-30 DIAGNOSIS — T8144XA Sepsis following a procedure, initial encounter: Secondary | ICD-10-CM | POA: Diagnosis not present

## 2020-04-30 DIAGNOSIS — D649 Anemia, unspecified: Secondary | ICD-10-CM | POA: Diagnosis not present

## 2020-04-30 DIAGNOSIS — J3489 Other specified disorders of nose and nasal sinuses: Secondary | ICD-10-CM | POA: Diagnosis not present

## 2020-04-30 DIAGNOSIS — K659 Peritonitis, unspecified: Secondary | ICD-10-CM | POA: Diagnosis not present

## 2020-04-30 DIAGNOSIS — R109 Unspecified abdominal pain: Secondary | ICD-10-CM | POA: Diagnosis not present

## 2020-04-30 DIAGNOSIS — D125 Benign neoplasm of sigmoid colon: Secondary | ICD-10-CM | POA: Diagnosis not present

## 2020-04-30 DIAGNOSIS — N179 Acute kidney failure, unspecified: Secondary | ICD-10-CM

## 2020-04-30 DIAGNOSIS — Z515 Encounter for palliative care: Secondary | ICD-10-CM | POA: Diagnosis not present

## 2020-04-30 DIAGNOSIS — M4186 Other forms of scoliosis, lumbar region: Secondary | ICD-10-CM | POA: Diagnosis not present

## 2020-04-30 DIAGNOSIS — Z66 Do not resuscitate: Secondary | ICD-10-CM | POA: Diagnosis not present

## 2020-04-30 DIAGNOSIS — K5669 Other partial intestinal obstruction: Secondary | ICD-10-CM | POA: Diagnosis not present

## 2020-04-30 DIAGNOSIS — E870 Hyperosmolality and hypernatremia: Secondary | ICD-10-CM | POA: Diagnosis not present

## 2020-04-30 DIAGNOSIS — T8112XA Postprocedural septic shock, initial encounter: Secondary | ICD-10-CM | POA: Diagnosis not present

## 2020-04-30 DIAGNOSIS — K9189 Other postprocedural complications and disorders of digestive system: Secondary | ICD-10-CM | POA: Diagnosis not present

## 2020-04-30 DIAGNOSIS — F32A Depression, unspecified: Secondary | ICD-10-CM | POA: Diagnosis present

## 2020-04-30 DIAGNOSIS — R509 Fever, unspecified: Secondary | ICD-10-CM | POA: Diagnosis not present

## 2020-04-30 DIAGNOSIS — K7581 Nonalcoholic steatohepatitis (NASH): Secondary | ICD-10-CM | POA: Diagnosis present

## 2020-04-30 DIAGNOSIS — K635 Polyp of colon: Secondary | ICD-10-CM | POA: Diagnosis not present

## 2020-04-30 DIAGNOSIS — R0600 Dyspnea, unspecified: Secondary | ICD-10-CM

## 2020-04-30 DIAGNOSIS — K640 First degree hemorrhoids: Secondary | ICD-10-CM | POA: Diagnosis not present

## 2020-04-30 DIAGNOSIS — F411 Generalized anxiety disorder: Secondary | ICD-10-CM | POA: Diagnosis present

## 2020-04-30 DIAGNOSIS — N183 Chronic kidney disease, stage 3 unspecified: Secondary | ICD-10-CM | POA: Diagnosis not present

## 2020-04-30 DIAGNOSIS — D120 Benign neoplasm of cecum: Secondary | ICD-10-CM | POA: Diagnosis not present

## 2020-04-30 DIAGNOSIS — E872 Acidosis: Secondary | ICD-10-CM | POA: Diagnosis not present

## 2020-04-30 DIAGNOSIS — R0902 Hypoxemia: Secondary | ICD-10-CM

## 2020-04-30 DIAGNOSIS — Z8371 Family history of colonic polyps: Secondary | ICD-10-CM

## 2020-04-30 DIAGNOSIS — E876 Hypokalemia: Secondary | ICD-10-CM | POA: Diagnosis not present

## 2020-04-30 DIAGNOSIS — Z01818 Encounter for other preprocedural examination: Secondary | ICD-10-CM

## 2020-04-30 DIAGNOSIS — Z87442 Personal history of urinary calculi: Secondary | ICD-10-CM | POA: Diagnosis present

## 2020-04-30 DIAGNOSIS — G9341 Metabolic encephalopathy: Secondary | ICD-10-CM | POA: Diagnosis not present

## 2020-04-30 DIAGNOSIS — C189 Malignant neoplasm of colon, unspecified: Secondary | ICD-10-CM | POA: Diagnosis not present

## 2020-04-30 DIAGNOSIS — T8132XA Disruption of internal operation (surgical) wound, not elsewhere classified, initial encounter: Secondary | ICD-10-CM | POA: Diagnosis not present

## 2020-04-30 DIAGNOSIS — Z800 Family history of malignant neoplasm of digestive organs: Secondary | ICD-10-CM

## 2020-04-30 DIAGNOSIS — Y832 Surgical operation with anastomosis, bypass or graft as the cause of abnormal reaction of the patient, or of later complication, without mention of misadventure at the time of the procedure: Secondary | ICD-10-CM | POA: Diagnosis not present

## 2020-04-30 DIAGNOSIS — D123 Benign neoplasm of transverse colon: Secondary | ICD-10-CM | POA: Diagnosis not present

## 2020-04-30 DIAGNOSIS — K7689 Other specified diseases of liver: Secondary | ICD-10-CM | POA: Diagnosis not present

## 2020-04-30 DIAGNOSIS — R54 Age-related physical debility: Secondary | ICD-10-CM | POA: Diagnosis present

## 2020-04-30 DIAGNOSIS — D696 Thrombocytopenia, unspecified: Secondary | ICD-10-CM | POA: Diagnosis present

## 2020-04-30 DIAGNOSIS — E669 Obesity, unspecified: Secondary | ICD-10-CM | POA: Diagnosis present

## 2020-04-30 DIAGNOSIS — D61818 Other pancytopenia: Secondary | ICD-10-CM | POA: Diagnosis not present

## 2020-04-30 DIAGNOSIS — Z20822 Contact with and (suspected) exposure to covid-19: Secondary | ICD-10-CM | POA: Diagnosis present

## 2020-04-30 DIAGNOSIS — D509 Iron deficiency anemia, unspecified: Secondary | ICD-10-CM | POA: Diagnosis not present

## 2020-04-30 DIAGNOSIS — I6782 Cerebral ischemia: Secondary | ICD-10-CM | POA: Diagnosis not present

## 2020-04-30 DIAGNOSIS — J9811 Atelectasis: Secondary | ICD-10-CM | POA: Diagnosis not present

## 2020-04-30 DIAGNOSIS — R001 Bradycardia, unspecified: Secondary | ICD-10-CM | POA: Diagnosis present

## 2020-04-30 DIAGNOSIS — D490 Neoplasm of unspecified behavior of digestive system: Secondary | ICD-10-CM | POA: Diagnosis not present

## 2020-04-30 DIAGNOSIS — D128 Benign neoplasm of rectum: Secondary | ICD-10-CM | POA: Diagnosis present

## 2020-04-30 DIAGNOSIS — J9601 Acute respiratory failure with hypoxia: Secondary | ICD-10-CM | POA: Diagnosis not present

## 2020-04-30 DIAGNOSIS — B952 Enterococcus as the cause of diseases classified elsewhere: Secondary | ICD-10-CM | POA: Diagnosis present

## 2020-04-30 DIAGNOSIS — R932 Abnormal findings on diagnostic imaging of liver and biliary tract: Secondary | ICD-10-CM | POA: Diagnosis not present

## 2020-04-30 DIAGNOSIS — G319 Degenerative disease of nervous system, unspecified: Secondary | ICD-10-CM | POA: Diagnosis not present

## 2020-04-30 DIAGNOSIS — R531 Weakness: Secondary | ICD-10-CM | POA: Diagnosis not present

## 2020-04-30 DIAGNOSIS — Z4659 Encounter for fitting and adjustment of other gastrointestinal appliance and device: Secondary | ICD-10-CM

## 2020-04-30 DIAGNOSIS — K6389 Other specified diseases of intestine: Secondary | ICD-10-CM | POA: Diagnosis not present

## 2020-04-30 DIAGNOSIS — D124 Benign neoplasm of descending colon: Secondary | ICD-10-CM | POA: Diagnosis not present

## 2020-04-30 DIAGNOSIS — K567 Ileus, unspecified: Secondary | ICD-10-CM | POA: Diagnosis not present

## 2020-04-30 DIAGNOSIS — C186 Malignant neoplasm of descending colon: Secondary | ICD-10-CM | POA: Diagnosis not present

## 2020-04-30 DIAGNOSIS — I340 Nonrheumatic mitral (valve) insufficiency: Secondary | ICD-10-CM | POA: Diagnosis not present

## 2020-04-30 DIAGNOSIS — J320 Chronic maxillary sinusitis: Secondary | ICD-10-CM | POA: Diagnosis not present

## 2020-04-30 DIAGNOSIS — N200 Calculus of kidney: Secondary | ICD-10-CM | POA: Diagnosis not present

## 2020-04-30 DIAGNOSIS — Z6831 Body mass index (BMI) 31.0-31.9, adult: Secondary | ICD-10-CM

## 2020-04-30 DIAGNOSIS — R569 Unspecified convulsions: Secondary | ICD-10-CM | POA: Diagnosis not present

## 2020-04-30 DIAGNOSIS — E8779 Other fluid overload: Secondary | ICD-10-CM | POA: Diagnosis not present

## 2020-04-30 DIAGNOSIS — M1990 Unspecified osteoarthritis, unspecified site: Secondary | ICD-10-CM | POA: Diagnosis present

## 2020-04-30 DIAGNOSIS — I351 Nonrheumatic aortic (valve) insufficiency: Secondary | ICD-10-CM | POA: Diagnosis not present

## 2020-04-30 DIAGNOSIS — D62 Acute posthemorrhagic anemia: Secondary | ICD-10-CM | POA: Diagnosis not present

## 2020-04-30 DIAGNOSIS — Z4682 Encounter for fitting and adjustment of non-vascular catheter: Secondary | ICD-10-CM | POA: Diagnosis not present

## 2020-04-30 DIAGNOSIS — R079 Chest pain, unspecified: Secondary | ICD-10-CM | POA: Diagnosis not present

## 2020-04-30 DIAGNOSIS — N170 Acute kidney failure with tubular necrosis: Secondary | ICD-10-CM | POA: Diagnosis not present

## 2020-04-30 DIAGNOSIS — F329 Major depressive disorder, single episode, unspecified: Secondary | ICD-10-CM | POA: Diagnosis present

## 2020-04-30 DIAGNOSIS — I129 Hypertensive chronic kidney disease with stage 1 through stage 4 chronic kidney disease, or unspecified chronic kidney disease: Secondary | ICD-10-CM | POA: Diagnosis present

## 2020-04-30 DIAGNOSIS — D631 Anemia in chronic kidney disease: Secondary | ICD-10-CM | POA: Diagnosis not present

## 2020-04-30 DIAGNOSIS — K7460 Unspecified cirrhosis of liver: Secondary | ICD-10-CM | POA: Diagnosis not present

## 2020-04-30 DIAGNOSIS — R52 Pain, unspecified: Secondary | ICD-10-CM | POA: Diagnosis not present

## 2020-04-30 DIAGNOSIS — E871 Hypo-osmolality and hyponatremia: Secondary | ICD-10-CM | POA: Diagnosis present

## 2020-04-30 DIAGNOSIS — K621 Rectal polyp: Secondary | ICD-10-CM | POA: Diagnosis not present

## 2020-04-30 DIAGNOSIS — I700 Atherosclerosis of aorta: Secondary | ICD-10-CM | POA: Diagnosis not present

## 2020-04-30 DIAGNOSIS — D500 Iron deficiency anemia secondary to blood loss (chronic): Secondary | ICD-10-CM | POA: Diagnosis not present

## 2020-04-30 DIAGNOSIS — J80 Acute respiratory distress syndrome: Secondary | ICD-10-CM | POA: Diagnosis not present

## 2020-04-30 DIAGNOSIS — J90 Pleural effusion, not elsewhere classified: Secondary | ICD-10-CM | POA: Diagnosis not present

## 2020-04-30 DIAGNOSIS — I361 Nonrheumatic tricuspid (valve) insufficiency: Secondary | ICD-10-CM | POA: Diagnosis not present

## 2020-04-30 DIAGNOSIS — Z6830 Body mass index (BMI) 30.0-30.9, adult: Secondary | ICD-10-CM

## 2020-04-30 DIAGNOSIS — Z882 Allergy status to sulfonamides status: Secondary | ICD-10-CM

## 2020-04-30 DIAGNOSIS — E861 Hypovolemia: Secondary | ICD-10-CM | POA: Diagnosis not present

## 2020-04-30 DIAGNOSIS — K7469 Other cirrhosis of liver: Secondary | ICD-10-CM | POA: Diagnosis present

## 2020-04-30 DIAGNOSIS — D6959 Other secondary thrombocytopenia: Secondary | ICD-10-CM | POA: Diagnosis present

## 2020-04-30 LAB — CBC WITH DIFFERENTIAL/PLATELET
Abs Immature Granulocytes: 0.02 10*3/uL (ref 0.00–0.07)
Abs Immature Granulocytes: 0.03 10*3/uL (ref 0.00–0.07)
Basophils Absolute: 0 10*3/uL (ref 0.0–0.1)
Basophils Absolute: 0 10*3/uL (ref 0.0–0.1)
Basophils Relative: 0 %
Basophils Relative: 0 %
Eosinophils Absolute: 0 10*3/uL (ref 0.0–0.5)
Eosinophils Absolute: 0 10*3/uL (ref 0.0–0.5)
Eosinophils Relative: 0 %
Eosinophils Relative: 0 %
HCT: 12.3 % — ABNORMAL LOW (ref 36.0–46.0)
HCT: 11.9 % — ABNORMAL LOW (ref 36.0–46.0)
Hemoglobin: 2.9 g/dL — CL (ref 12.0–15.0)
Hemoglobin: 2.7 g/dL — CL (ref 12.0–15.0)
Immature Granulocytes: 1 %
Immature Granulocytes: 1 %
Lymphocytes Relative: 8 %
Lymphocytes Relative: 10 %
Lymphs Abs: 0.3 10*3/uL — ABNORMAL LOW (ref 0.7–4.0)
Lymphs Abs: 0.4 10*3/uL — ABNORMAL LOW (ref 0.7–4.0)
MCH: 17.7 pg — ABNORMAL LOW (ref 26.0–34.0)
MCH: 16.6 pg — ABNORMAL LOW (ref 26.0–34.0)
MCHC: 23.6 g/dL — ABNORMAL LOW (ref 30.0–36.0)
MCHC: 22.7 g/dL — ABNORMAL LOW (ref 30.0–36.0)
MCV: 75 fL — ABNORMAL LOW (ref 80.0–100.0)
MCV: 73 fL — ABNORMAL LOW (ref 80.0–100.0)
Monocytes Absolute: 0.3 10*3/uL (ref 0.1–1.0)
Monocytes Absolute: 0.3 10*3/uL (ref 0.1–1.0)
Monocytes Relative: 7 %
Monocytes Relative: 7 %
Neutro Abs: 3.3 10*3/uL (ref 1.7–7.7)
Neutro Abs: 3 10*3/uL (ref 1.7–7.7)
Neutrophils Relative %: 84 %
Neutrophils Relative %: 82 %
Platelets: 98 10*3/uL — ABNORMAL LOW (ref 150–400)
Platelets: 80 10*3/uL — ABNORMAL LOW (ref 150–400)
RBC: 1.64 MIL/uL — ABNORMAL LOW (ref 3.87–5.11)
RBC: 1.63 MIL/uL — ABNORMAL LOW (ref 3.87–5.11)
RDW: 18.2 % — ABNORMAL HIGH (ref 11.5–15.5)
RDW: 18.3 % — ABNORMAL HIGH (ref 11.5–15.5)
WBC: 3.9 10*3/uL — ABNORMAL LOW (ref 4.0–10.5)
WBC: 3.6 10*3/uL — ABNORMAL LOW (ref 4.0–10.5)
nRBC: 0.5 % — ABNORMAL HIGH (ref 0.0–0.2)
nRBC: 0 % (ref 0.0–0.2)

## 2020-04-30 LAB — URINALYSIS, ROUTINE W REFLEX MICROSCOPIC
Bilirubin Urine: NEGATIVE
Glucose, UA: NEGATIVE mg/dL
Hgb urine dipstick: NEGATIVE
Ketones, ur: NEGATIVE mg/dL
Nitrite: NEGATIVE
Protein, ur: 30 mg/dL — AB
Specific Gravity, Urine: 1.011 (ref 1.005–1.030)
pH: 6 (ref 5.0–8.0)

## 2020-04-30 LAB — BASIC METABOLIC PANEL
Anion gap: 10 (ref 5–15)
BUN: 19 mg/dL (ref 8–23)
CO2: 21 mmol/L — ABNORMAL LOW (ref 22–32)
Calcium: 8.6 mg/dL — ABNORMAL LOW (ref 8.9–10.3)
Chloride: 110 mmol/L (ref 98–111)
Creatinine, Ser: 1.22 mg/dL — ABNORMAL HIGH (ref 0.44–1.00)
GFR calc Af Amer: 51 mL/min — ABNORMAL LOW (ref >60)
GFR calc non Af Amer: 44 mL/min — ABNORMAL LOW (ref >60)
Glucose, Bld: 125 mg/dL — ABNORMAL HIGH (ref 70–99)
Potassium: 3.5 mmol/L (ref 3.5–5.1)
Sodium: 141 mmol/L (ref 135–145)

## 2020-04-30 LAB — IRON AND TIBC
Iron: 10 ug/dL — ABNORMAL LOW (ref 28–170)
Saturation Ratios: 2 % — ABNORMAL LOW (ref 10.4–31.8)
TIBC: 514 ug/dL — ABNORMAL HIGH (ref 250–450)
UIBC: 504 ug/dL

## 2020-04-30 LAB — FERRITIN: Ferritin: 5 ng/mL — ABNORMAL LOW (ref 11–307)

## 2020-04-30 LAB — RETICULOCYTES
Immature Retic Fract: 27.7 % — ABNORMAL HIGH (ref 2.3–15.9)
RBC.: 1.67 MIL/uL — ABNORMAL LOW (ref 3.87–5.11)
Retic Count, Absolute: 45.4 10*3/uL (ref 19.0–186.0)
Retic Ct Pct: 2.7 % (ref 0.4–3.1)

## 2020-04-30 LAB — PHOSPHORUS: Phosphorus: 3.8 mg/dL (ref 2.5–4.6)

## 2020-04-30 LAB — MAGNESIUM: Magnesium: 2 mg/dL (ref 1.7–2.4)

## 2020-04-30 LAB — LACTATE DEHYDROGENASE: LDH: 127 U/L (ref 98–192)

## 2020-04-30 LAB — PROTIME-INR
INR: 1.3 — ABNORMAL HIGH (ref 0.8–1.2)
Prothrombin Time: 15.9 seconds — ABNORMAL HIGH (ref 11.4–15.2)

## 2020-04-30 LAB — CK: Total CK: 64 U/L (ref 38–234)

## 2020-04-30 LAB — APTT: aPTT: 28 seconds (ref 24–36)

## 2020-04-30 LAB — CBG MONITORING, ED: Glucose-Capillary: 100 mg/dL — ABNORMAL HIGH (ref 70–99)

## 2020-04-30 LAB — DIRECT ANTIGLOBULIN TEST (NOT AT ARMC)
DAT, IgG: NEGATIVE
DAT, complement: NEGATIVE

## 2020-04-30 LAB — D-DIMER, QUANTITATIVE: D-Dimer, Quant: 0.7 ug/mL-FEU — ABNORMAL HIGH (ref 0.00–0.50)

## 2020-04-30 LAB — FOLATE: Folate: 20.4 ng/mL (ref >5.9)

## 2020-04-30 LAB — ABO/RH: ABO/RH(D): O POS

## 2020-04-30 LAB — TROPONIN I (HIGH SENSITIVITY)
Troponin I (High Sensitivity): 12 ng/L (ref <18)
Troponin I (High Sensitivity): 12 ng/L (ref <18)

## 2020-04-30 LAB — POC OCCULT BLOOD, ED: Fecal Occult Bld: NEGATIVE

## 2020-04-30 LAB — VITAMIN B12: Vitamin B-12: 390 pg/mL (ref 180–914)

## 2020-04-30 LAB — BRAIN NATRIURETIC PEPTIDE: B Natriuretic Peptide: 235.4 pg/mL — ABNORMAL HIGH (ref 0.0–100.0)

## 2020-04-30 LAB — TSH: TSH: 1.301 u[IU]/mL (ref 0.350–4.500)

## 2020-04-30 LAB — PREPARE RBC (CROSSMATCH)

## 2020-04-30 LAB — SARS CORONAVIRUS 2 BY RT PCR (HOSPITAL ORDER, PERFORMED IN ~~LOC~~ HOSPITAL LAB): SARS Coronavirus 2: NEGATIVE

## 2020-04-30 MED ORDER — ACETAMINOPHEN 650 MG RE SUPP: 650.0000 mg | Freq: Four times a day (QID) | RECTAL | Status: DC | PRN

## 2020-04-30 MED ORDER — SODIUM CHLORIDE 0.9 % IV SOLN
10.0000 mL/h | Freq: Once | INTRAVENOUS | Status: AC
  Administered 2020-04-30: 10 mL/h via INTRAVENOUS

## 2020-04-30 MED ORDER — ONDANSETRON HCL 4 MG/2ML IJ SOLN
4.0000 mg | Freq: Four times a day (QID) | INTRAMUSCULAR | Status: DC | PRN
  Administered 2020-05-10: 4 mg via INTRAVENOUS
  Filled 2020-04-30: qty 2

## 2020-04-30 MED ORDER — ONDANSETRON HCL 4 MG PO TABS: 4.0000 mg | ORAL_TABLET | Freq: Four times a day (QID) | ORAL | Status: DC | PRN

## 2020-04-30 MED ORDER — ACETAMINOPHEN 325 MG PO TABS
650.0000 mg | ORAL_TABLET | Freq: Four times a day (QID) | ORAL | Status: DC | PRN
  Administered 2020-05-08 – 2020-05-09 (×2): 650 mg via ORAL
  Filled 2020-04-30 (×2): qty 2

## 2020-04-30 MED ORDER — SODIUM CHLORIDE 0.9 % IV SOLN: INTRAVENOUS | Status: DC

## 2020-04-30 NOTE — ED Provider Notes (Signed)
[facility] EMERGENCY DEPARTMENT Provider Note   CSN: [id number] Arrival date & time: 05/20/2020  1315     History Chief Complaint  Patient presents with  . Loss of Consciousness    Regina Richards is a 74 y.o. female.  The history is provided by the patient and medical records. No language interpreter was used.  Loss of Consciousness    74 year old female significant history of hypertension, anxiety, heart murmur, brought here via EMS from home for evaluation of weakness.  Patient reports several weeks ago she recall feeling generalized body aches and weakness upon standing that happened intermittently and improves with rest.  She denies any active chest pain during these episodes but did report feeling some short of breath.  She had an appointment with cardiology today to be evaluated for her symptoms.  However this morning when she was getting into the car to go to her cardiology office, she had a brief syncopal episode witnessed by her sister who was driving.  States that when she sat down to the back seat she became unconscious for approximately 2 minutes.  It was reported that patient was having some shakiness during this episode.  She proceeds to go to her cardiology appointment to be evaluated.  She was recommended to come to ER for for evaluation of her syncope.  Patient also report he was seen by urologist several days ago and was supposed to be treated for.  Tract infection with antibiotic but have not started medication yet.  She denies any dysuria or hematuria.  EMS report patient had a normal orthostatic vital sign and her EKG was unremarkable.  However when patient was brought to the ER, she had another brief syncopal episode when she appears to be in distress, having some trouble breathing and became incontinence as well as posturing for approximately 45 seconds.    Past Medical History:  Diagnosis Date  . Anxiety   . Arthritis   . Dysrhythmia    hx of murmur     . History of kidney stones   . Hypertension    off bp meds " for a while"  . Pneumonia last 10 yrs ago   hx of  . PONV (postoperative nausea and vomiting)    hx of    Patient Active Problem List   Diagnosis Date Noted  . GAD (generalized anxiety disorder) 10/01/2018  . Sepsis due to Escherichia coli with acute renal failure ([place]) 08/09/2016  . Bacteremia due to Escherichia coli 08/09/2016  . Essential hypertension, benign 08/09/2016  . Ureteral stone with hydronephrosis 08/09/2016  . Septic encephalopathy 08/09/2016  . Hypokalemia 08/09/2016  . Hypomagnesemia 08/09/2016  . Hypophosphatemia 08/09/2016  . Septic shock ([place]) 08/04/2016  . Acute kidney injury ([place])   . Acute pyelonephritis   . Acute hypoxemic respiratory failure [facility])     Past Surgical History:  Procedure Laterality Date  . CYSTOSCOPY W/ URETERAL STENT PLACEMENT Bilateral 08/03/2016   Procedure: CYSTOSCOPY WITH BILATERAL STENT REPLACEMENT, BILATERAL RETROGRADE PYELOGRAM;  Surgeon: Kathie Rhodes, MD;  Location: [facility];  Service: Urology;  Laterality: Bilateral;  . CYSTOSCOPY/RETROGRADE/URETEROSCOPY/STONE EXTRACTION WITH BASKET Bilateral 09/08/2016   Procedure: BILATERAL CYSTOSCOPY/RETROGRADE/URETEROSCOPY/STONE EXTRACTION / HOLMIUM LASER /STENT REMOVAL;  Surgeon: Kathie Rhodes, MD;  Location: [facility];  Service: Urology;  Laterality: Bilateral;  . CYSTOSCOPY/URETEROSCOPY/HOLMIUM LASER/STENT PLACEMENT Right 09/23/2018   Procedure: CYSTOSCOPY/RETROGRADE/URETEROSCOPY/STENT PLACEMENT RIGHT ;  Surgeon: Kathie Rhodes, MD;  Location[facility];  Service: Urology;  Laterality: Right;  . polyp  removed from uterus       OB History   No obstetric history on file.     No family history on file.  Social History   Tobacco Use  . Smoking status: Never Smoker  . Smokeless tobacco: Never Used  Vaping Use  . Vaping Use: Never used  Substance Use Topics  . Alcohol use: Yes    Alcohol/week: 5.0 standard drinks     Types: 5 Cans of beer per week  . Drug use: No    Home Medications Prior to Admission medications   Medication Sig Start Date End Date Taking? Authorizing Provider  PARoxetine (PAXIL) 40 MG tablet TAKE 1 TABLET(40 MG) BY MOUTH EVERY MORNING 04/29/2020   Addison Lank, PA-C    Allergies    Sulfur  Review of Systems   Review of Systems  Cardiovascular: Positive for syncope.  All other systems reviewed and are negative.   Physical Exam Updated Vital Signs BP (!) 122/53 (BP Location: Right Arm)   Pulse 71   Temp 97.7 F (36.5 C) (Oral)   Resp 18   LMP  (LMP Unknown)   SpO2 98%   Physical Exam Vitals and nursing note reviewed.  Constitutional:      General: She is not in acute distress.    Appearance: She is well-developed. She is obese.  HENT:     Head: Atraumatic.  Eyes:     Conjunctiva/sclera: Conjunctivae normal.  Neck:     Comments: +JVD Cardiovascular:     Rate and Rhythm: Normal rate and regular rhythm.     Heart sounds: Murmur heard.   Pulmonary:     Effort: Pulmonary effort is normal.     Breath sounds: Normal breath sounds.  Abdominal:     Palpations: Abdomen is soft.  Musculoskeletal:        General: Swelling (BLE: trace edema bilaterally) present.     Cervical back: Neck supple.  Skin:    Coloration: Skin is pale.     Findings: No rash.  Neurological:     Mental Status: She is alert and oriented to person, place, and time.  Psychiatric:        Mood and Affect: Mood normal.     ED Results / Procedures / Treatments   Labs (all labs ordered are listed, but only abnormal results are displayed) Labs Reviewed  BASIC METABOLIC PANEL - Abnormal; Notable for the following components:      Result Value   CO2 21 (*)    Glucose, Bld 125 (*)    Creatinine, Ser 1.22 (*)    Calcium 8.6 (*)    GFR calc non Af Amer 44 (*)    GFR calc Af Amer 51 (*)    All other components within normal limits  CBC WITH DIFFERENTIAL/PLATELET - Abnormal; Notable for  the following components:   WBC 3.9 (*)    RBC 1.64 (*)    Hemoglobin 2.9 (*)    HCT 12.3 (*)    MCV 75.0 (*)    MCH 17.7 (*)    MCHC 23.6 (*)    RDW 18.2 (*)    Platelets 98 (*)    nRBC 0.5 (*)    Lymphs Abs 0.3 (*)    All other components within normal limits  URINALYSIS, ROUTINE W REFLEX MICROSCOPIC - Abnormal; Notable for the following components:   Protein, ur 30 (*)    Leukocytes,Ua MODERATE (*)    Bacteria, UA RARE (*)    All  other components within normal limits  D-DIMER, QUANTITATIVE (NOT AT [facility]) - Abnormal; Notable for the following components:   D-Dimer, Quant 0.70 (*)    All other components within normal limits  BRAIN NATRIURETIC PEPTIDE - Abnormal; Notable for the following components:   B Natriuretic Peptide 235.4 (*)    All other components within normal limits  CBG MONITORING, ED - Abnormal; Notable for the following components:   Glucose-Capillary 100 (*)    All other components within normal limits  SARS CORONAVIRUS 2 BY RT PCR (HOSPITAL ORDER, [place] [facility])  CBC WITH DIFFERENTIAL/PLATELET  POC OCCULT BLOOD, ED  I-STAT CHEM 8, ED  PREPARE RBC (CROSSMATCH)  TYPE AND SCREEN  TROPONIN I (HIGH SENSITIVITY)  TROPONIN I (HIGH SENSITIVITY)    EKG EKG Interpretation  Date/Time:  Friday April 30 2020 13:29:06 EDT Ventricular Rate:  89 PR Interval:    QRS Duration: 75 QT Interval:  348 QTC Calculation: 424 R Axis:   56 Text Interpretation: Sinus rhythm Repol abnrm suggests ischemia, lateral leads similar to Jan 2020 Confirmed by Sherwood Gambler [phone]) on 05/26/2020 2:38:13 PM   Radiology DG Chest Portable 1 View  Result Date: 05/04/2020 CLINICAL DATA:  Weakness EXAM: PORTABLE CHEST 1 VIEW COMPARISON:  09/06/2016 chest radiograph and prior. FINDINGS: Hypoinflated lungs. Streaky bibasilar opacities. No pneumothorax. Blunting of the bilateral costophrenic sulci may reflect scarring versus trace effusions. Cardiomediastinal  silhouette within normal limits. Multilevel spondylosis. IMPRESSION: Hypoinflated lungs.  Bibasilar linear opacities, likely atelectasis. Trace bilateral pleural effusions versus bibasilar scarring. Electronically Signed   By: Primitivo Gauze M.D.   On: 05/06/2020 14:03    Procedures .Critical Care Performed by: Domenic Moras, PA-C Authorized by: Domenic Moras, PA-C   Critical care provider statement:    Critical care time (minutes):  45   Critical care was time spent personally by me on the following activities:  Discussions with consultants, evaluation of patient's response to treatment, examination of patient, ordering and performing treatments and interventions, ordering and review of laboratory studies, ordering and review of radiographic studies, pulse oximetry, re-evaluation of patient's condition, obtaining history from patient or surrogate and review of old charts   (including critical care time)  Medications Ordered in ED Medications  0.9 %  sodium chloride infusion ( Intravenous New Bag/Given 05/03/2020 1407)  0.9 %  sodium chloride infusion (has no administration in time range)    ED Course  I have reviewed the triage vital signs and the nursing notes.  Pertinent labs & imaging results that were available during my care of the patient were reviewed by me and considered in my medical decision making (see chart for details).    MDM Rules/Calculators/A&P                          BP (!) 106/57   Pulse 84   Temp 97.7 F (36.5 C) (Oral)   Resp 14   LMP  (LMP Unknown)   SpO2 97%   Final Clinical Impression(s) / ED Diagnoses Final diagnoses:  Symptomatic anemia  Pancytopenia ([place])    Rx / DC Orders ED Discharge Orders    None     Patient here for evaluation of having multiple syncopal episodes with questionable seizure-like activities.  She is mentating appropriately she has no focal neuro deficit on exam. Pt is pale in appearance.  Work-up initiated.  Care discussed  with Dr. Regenia Skeeter.  2:38 PM When performing orthostatic vital sign,  as patient sat down to her bed, she became bradycardic with heart rates in the 30s and had a near syncopal episode, with urinary incontinence.  EKG obtained heart rate of 38 no obvious P waves.  QRS appears to be narrow complex.  This is her 3rd near syncopal/syncopal episodes throughout the day today.   3:52 PM Cardiology was consulted and agrees to be involve in pt care.  Recommend medicine admission  Labs came back showing significant anemia with Hgb 2.9.  MCV 75, consistent with microcytic anemia.  Will recheck labs to confirm. Blood product ordered.    5:38 PM Patient's labs consistent with pancytopenia.  I have ordered blood transfusion.  I have consulted [facility] hospitalist, Dr. Doristine Bosworth who request hematology to be consulted and she will admit pt.   6:34 PM I have consulted hematologist Dr. Lorenso Courier who recommend obtaining  LDH, haptoglobin, DAT, blood smear, retic.  He will see pt tomorrow for further management.    Regina Richards was evaluated in Emergency Department on 05/18/2020 for the symptoms described in the history of present illness. She was evaluated in the context of the global COVID-19 pandemic, which necessitated consideration that the patient might be at risk for infection with the SARS-CoV-2 virus that causes COVID-19. Institutional protocols and algorithms that pertain to the evaluation of patients at risk for COVID-19 are in a state of rapid change based on information released by regulatory bodies including the CDC and federal and state organizations. These policies and algorithms were followed during the patient's care in the ED.    Domenic Moras, PA-C 05/24/2020 1836    Sherwood Gambler, MD 05/11/2020 [id number]

## 2020-04-30 NOTE — ED Triage Notes (Addendum)
[facility] reports pt lives at home and was at cards office as follow up for possible MI. Pt had a 30 sec syncopal event in the car on the way to the doc. Pt reported not feeling right and anxiety about MI. EMS called, neg orthostatics, ekg unremarkable. V/S WNL bp 138/100. CBG 196 At bridge when pt transferred from stretcher to [facility], pt suddenly in distress, guppie breathing, incontinent and posturing for about 45. EMS did have a rad pulse for 10 sec. No hx of seizures.

## 2020-04-30 NOTE — Consult Note (Signed)
Cardiology Consultation:   Patient ID: Regina Richards MRN: [id number]; DOB: 1945/12/12  Admit date: 05/17/2020 Date of Consult: 05/11/2020  Primary Care Provider: Patient, No Pcp Per [place] Cardiologist: Donato Heinz, MD  [place] Electrophysiologist:  None   Please note pt was seen in the office for consult and sent to ER for complete evaluation.  This is Dr. Newman Nickels note.     Date:  05/12/2020   ID:  Regina Richards, Regina Richards [birth year], MRN [id number]  Regina Richards is a 74 y.o. female who is being seen today for the evaluation of seizure type activity at the request of Dr. Regenia Skeeter..     Chief Complaint  Patient presents with  . Shortness of Breath    History of Present Illness:    Regina Richards is a 74 y.o. female with a hx of hypertension who presents for evaluation of shortness of breath.  She reports that starting several weeks ago she began to have leg and arm pain when she stands and starts walking.  States that she is comfortable when she sits down.  Has diffuse weakness.  She denies any chest pain.  States that she feels short of breath with minimal exertion.  Has been having episodes of lightheadedness and this morning had a syncopal episode.  States that she was getting into the back seat of her car when she lost consciousness.  Sisters states that she was unconscious for about 2 minutes.  Sister observed convulsions during that time.  EMS was called but patient did not wish to go to the ED at that time.  States that she saw her urologist 2 days ago and was supposed to be started on antibiotic for UTI but did not start taking yet.       Past Medical History:  Diagnosis Date  . Anxiety   . Arthritis   . Dysrhythmia    hx of murmur   . History of kidney stones   . Hypertension    off bp meds " for a while"  . Pneumonia last 10 yrs ago   hx of  . PONV (postoperative nausea and vomiting)    hx of         Past Surgical History:    Procedure Laterality Date  . CYSTOSCOPY W/ URETERAL STENT PLACEMENT Bilateral 08/03/2016   Procedure: CYSTOSCOPY WITH BILATERAL STENT REPLACEMENT, BILATERAL RETROGRADE PYELOGRAM;  Surgeon: Kathie Rhodes, MD;  Location: [facility];  Service: Urology;  Laterality: Bilateral;  . CYSTOSCOPY/RETROGRADE/URETEROSCOPY/STONE EXTRACTION WITH BASKET Bilateral 09/08/2016   Procedure: BILATERAL CYSTOSCOPY/RETROGRADE/URETEROSCOPY/STONE EXTRACTION / HOLMIUM LASER /STENT REMOVAL;  Surgeon: Kathie Rhodes, MD;  Location: [facility];  Service: Urology;  Laterality: Bilateral;  . CYSTOSCOPY/URETEROSCOPY/HOLMIUM LASER/STENT PLACEMENT Right 09/23/2018   Procedure: CYSTOSCOPY/RETROGRADE/URETEROSCOPY/STENT PLACEMENT RIGHT ;  Surgeon: Kathie Rhodes, MD;  Location: [facility];  Service: Urology;  Laterality: Right;  . polyp removed from uterus     Current Medications: Active Medications      Current Meds  Medication Sig  . PARoxetine (PAXIL) 40 MG tablet TAKE 1 TABLET(40 MG) BY MOUTH EVERY MORNING  . [DISCONTINUED] phenazopyridine (PYRIDIUM) 200 MG tablet Take 1 tablet (200 mg total) by mouth 3 (three) times daily as needed for pain.  . [DISCONTINUED] UNABLE TO FIND Juice plus 2 capsules per day       Allergies:   Sulfur   Social History        Socioeconomic History  . Marital status: Divorced    Spouse  name: Not on file  . Number of children: Not on file  . Years of education: Not on file  . Highest education level: Not on file  Occupational History  . Not on file  Tobacco Use  . Smoking status: Never Smoker  . Smokeless tobacco: Never Used  Vaping Use  . Vaping Use: Never used  Substance and Sexual Activity  . Alcohol use: Yes    Alcohol/week: 5.0 standard drinks    Types: 5 Cans of beer per week  . Drug use: No  . Sexual activity: Never  Other Topics Concern  . Not on file  Social History Narrative  . Not on file   Social Determinants of Health      Financial  Resource Strain:   . Difficulty of Paying Living Expenses: Not on file  Food Insecurity:   . Worried About Charity fundraiser in the Last Year: Not on file  . Ran Out of Food in the Last Year: Not on file  Transportation Needs:   . Lack of Transportation (Medical): Not on file  . Lack of Transportation (Non-Medical): Not on file  Physical Activity:   . Days of Exercise per Week: Not on file  . Minutes of Exercise per Session: Not on file  Stress:   . Feeling of Stress : Not on file  Social Connections:   . Frequency of Communication with Friends and Family: Not on file  . Frequency of Social Gatherings with Friends and Family: Not on file  . Attends Religious Services: Not on file  . Active Member of Clubs or Organizations: Not on file  . Attends Archivist Meetings: Not on file  . Marital Status: Not on file     Family History: The patient's family history is not on file.  ROS:   Please see the history of present illness.     All other systems reviewed and are negative.  EKGs/Labs/Other Studies Reviewed:    The following studies were reviewed today:   EKG:  EKG is ordered today.  The ekg ordered today demonstrates normal sinus rhythm, rate 88, nonspecific T wave flattening, diffuse subtle ST depression  Recent Labs: No results found for requested labs within last 8760 hours.  Recent Lipid Panel Labs (Brief)  No results found for: CHOL, TRIG, HDL, CHOLHDL, VLDL, LDLCALC, LDLDIRECT    Physical Exam:    VS:  BP 126/62   Pulse 88   Ht 5\' 4"  (1.626 m)   Wt 173 lb 6.4 oz (78.7 kg)   LMP  (LMP Unknown)   BMI 29.76 kg/m        Wt Readings from Last 3 Encounters:  05/16/2020 173 lb 6.4 oz (78.7 kg)  09/23/18 174 lb 2.6 oz (79 kg)  08/09/16 185 lb 10 oz (84.2 kg)     GEN:  in no acute distress HEENT: Normal NECK: + JVD LYMPHATICS: No lymphadenopathy CARDIAC: RRR, 2 out of 6 systolic murmur RESPIRATORY:  Clear to auscultation without rales,  wheezing or rhonchi  ABDOMEN: Soft, non-tender, non-distended MUSCULOSKELETAL:  Trace edema SKIN: Warm and dry NEUROLOGIC:  Alert and oriented x 3 PSYCHIATRIC:  Normal affect   ASSESSMENT:    1. Syncope and collapse   2. SOB (shortness of breath)   3. Weakness    PLAN:    Syncope: She reports diffuse weakness over the last several weeks and having dyspnea with minimal exertion.  No chest pain.  Has been having lightheadedness, and this morning  had a syncopal episode.  Sister reported she was unconscious for about 2 minutes and having convulsions.  Unclear etiology, could be convulsive syncope versus seizures.  Does report she was recently told she had a UTI but has not started treatment.  Recommend evaluation in ED.  From cardiac standpoint, would recommend checking echocardiogram and monitoring on telemetry.   Medication Adjustments/Labs and Tests Ordered: Current medicines are reviewed at length with the patient today.  Concerns regarding medicines are outlined above.     Orders Placed This Encounter  Procedures  . EKG 12-Lead   No orders of the defined types were placed in this encounter.   Patient Instructions  Please proceed to the ER to be evaluated     Signed, Donato Heinz, MD  05/24/2020 12:45 PM    [place]

## 2020-04-30 NOTE — ED Notes (Signed)
Patient is resting comfortably. 

## 2020-04-30 NOTE — H&P (Signed)
History and Physical    Regina Richards MVH:[id number] DOB: 06/13/1946 DOA: 05/03/2020  PCP: Patient, No Pcp Per  Patient coming from: home  I have personally briefly reviewed patient's old medical records in [facility]  Chief Complaint: Exertional shortness of breath, syncope, my shoulder and legs hurts when I stand up since couple of weeks.  HPI: [name] is a 74 y.o. female with medical history significant of anxiety, hypertension, kidney stones presents to emergency department for evaluation of exertional shortness of breath since 3 to 4 weeks.  Patient reports worsening shortness of breath especially with walking since 3 to 4 weeks.  Reports leg and arm pain when she stands up and starts walking and reduce with sitting.  Reports generalized weakness, lightheadedness and this morning had an syncopal episode which lasted for 2 minutes & had seizure?Marland Kitchen No head trauma.  Reports leg swelling however denies recent travel, immobilization, history of VTE, chest pain, palpitation, orthopnea, headache, blurry vision, fever, chills, cough, congestion, urinary symptoms including dysuria, hematuria, foul-smelling urine, change in urinary frequency, lower abdominal pain.  She tells me that she was seen by urologist 2 days ago and her urine was positive for bacteria therefore she was started on antibiotics for UTI but did not start taking yet.  She lives alone at home.  No history of smoking, illicit drug use however drinks 6 beer per week.  She is independent on daily life activities.  She is not vaccinated against COVID-19.  ED Course: Upon arrival to ED: Patient's vital signs stable.  She is afebrile.  On room air.  CBC shows WBC of 3.9, H&H of 2.9/12.3, MCV 75, platelet: 98.  D-dimer: 0.7, troponin negative, BNP: 235, UA positive for leukocyte and bacteria.  POC occult blood negative.  COVID-19: Pending.  Chest x-ray shows hyperinflated lungs, bibasilar linear opacity, likely atelectasis.   Trace bilateral pleural effusion versus bibasilar scarring.  CT head: Ordered and is pending.  Cardiology was consulted who recommended echo and telemetry.  EDP ordered 2 unit PRBC and consulted hematology.  [facility] hospital[facility] consulted for admission for symptomatic anemia.  Review of Systems: As per HPI otherwise negative.    Past Medical History:  Diagnosis Date  . Anxiety   . Arthritis   . Dysrhythmia    hx of murmur   . History of kidney stones   . Hypertension    off bp meds " for a while"  . Pneumonia last 10 yrs ago   hx of  . PONV (postoperative nausea and vomiting)    hx of    Past Surgical History:  Procedure Laterality Date  . CYSTOSCOPY W/ URETERAL STENT PLACEMENT Bilateral 08/03/2016   Procedure: CYSTOSCOPY WITH BILATERAL STENT REPLACEMENT, BILATERAL RETROGRADE PYELOGRAM;  Surgeon: Kathie Rhodes, MD;  Location: [facility];  Service: Urology;  Laterality: Bilateral;  . CYSTOSCOPY/RETROGRADE/URETEROSCOPY/STONE EXTRACTION WITH BASKET Bilateral 09/08/2016   Procedure: BILATERAL CYSTOSCOPY/RETROGRADE/URETEROSCOPY/STONE EXTRACTION / HOLMIUM LASER /STENT REMOVAL;  Surgeon: Kathie Rhodes, MD;  Location: [facility];  Service: Urology;  Laterality: Bilateral;  . CYSTOSCOPY/URETEROSCOPY/HOLMIUM LASER/STENT PLACEMENT Right 09/23/2018   Procedure: CYSTOSCOPY/RETROGRADE/URETEROSCOPY/STENT PLACEMENT RIGHT ;  Surgeon: Kathie Rhodes, MD;  Location: [facility];  Service: Urology;  Laterality: Right;  . polyp removed from uterus       reports that she has never smoked. She has never used smokeless tobacco. She reports current alcohol use of about 5.0 standard drinks of alcohol per week. She reports that she does not use drugs.  Allergies  Allergen Reactions  . Sulfur     rash    No family history on file.  Prior to Admission medications   Medication Sig Start Date End Date Taking? Authorizing Provider  PARoxetine (PAXIL) 40 MG tablet TAKE 1 TABLET(40 MG) BY MOUTH EVERY MORNING  05/22/2020   Addison Lank, [place]    Physical Exam: Vitals:   05/04/2020 1545 05/16/2020 1600 05/06/2020 1615 05/24/2020 1645  BP: (!) 136/58 (!) 128/59 131/60 (!) 106/57  Pulse: 85 91 84   Resp: 20 17 20 14   Temp:      TempSrc:      SpO2: 97% 97% 97%     Constitutional: NAD, calm, comfortable, on room air, communicating well, appears pale Eyes: PERRL, lids and conjunctivae: Pale ENMT: Mucous membranes are moist. Posterior pharynx clear of any exudate or lesions.Normal dentition.  Neck: normal, supple, no masses, no thyromegaly Respiratory: clear to auscultation bilaterally, no wheezing, no crackles. Normal respiratory effort. No accessory muscle use.  Cardiovascular: Regular rate and rhythm, no murmurs / rubs / gallops.  Bilateral 2+ pitting edema positive. 2+ pedal pulses. No carotid bruits.  Abdomen: no tenderness, no masses palpated. No hepatosplenomegaly. Bowel sounds positive.  Musculoskeletal: no clubbing / cyanosis. No joint deformity upper and lower extremities. Good ROM, no contractures. Normal muscle tone.  Skin: no rashes, lesions, ulcers. No induration Neurologic: CN 2-12 grossly intact. Sensation intact, DTR normal. Strength 5/5 in all 4.  Psychiatric: Normal judgment and insight. Alert and oriented x 3. Normal mood.    Labs on Admission: I have personally reviewed following labs and imaging studies  CBC: Recent Labs  Lab 05/21/2020 1344  WBC 3.9*  NEUTROABS 3.3  HGB 2.9*  HCT 12.3*  MCV 75.0*  PLT 98*   Basic Metabolic Panel: Recent Labs  Lab 05/10/2020 1344  NA 141  K 3.5  CL 110  CO2 21*  GLUCOSE 125*  BUN 19  CREATININE 1.22*  CALCIUM 8.6*   GFR: Estimated Creatinine Clearance: 41.7 mL/min (A) (by C-G formula based on SCr of 1.22 mg/dL (H)). Liver Function Tests: No results for input(s): AST, ALT, ALKPHOS, BILITOT, PROT, ALBUMIN in the last 168 hours. No results for input(s): LIPASE, AMYLASE in the last 168 hours. No results for input(s): AMMONIA in the  last 168 hours. Coagulation Profile: No results for input(s): INR, PROTIME in the last 168 hours. Cardiac Enzymes: No results for input(s): CKTOTAL, CKMB, CKMBINDEX, TROPONINI in the last 168 hours. BNP (last 3 results) No results for input(s): PROBNP in the last 8760 hours. HbA1C: No results for input(s): HGBA1C in the last 72 hours. CBG: Recent Labs  Lab 05/24/2020 1333  GLUCAP 100*   Lipid Profile: No results for input(s): CHOL, HDL, LDLCALC, TRIG, CHOLHDL, LDLDIRECT in the last 72 hours. Thyroid Function Tests: No results for input(s): TSH, T4TOTAL, FREET4, T3FREE, THYROIDAB in the last 72 hours. Anemia Panel: No results for input(s): VITAMINB12, FOLATE, FERRITIN, TIBC, IRON, RETICCTPCT in the last 72 hours. Urine analysis:    Component Value Date/Time   COLORURINE YELLOW 04/29/2020 1344   APPEARANCEUR CLEAR 05/18/2020 1344   LABSPEC 1.011 05/11/2020 1344   PHURINE 6.0 05/17/2020 1344   GLUCOSEU NEGATIVE 05/09/2020 1344   HGBUR NEGATIVE 05/14/2020 1344   BILIRUBINUR NEGATIVE 05/20/2020 1344   KETONESUR NEGATIVE 05/22/2020 1344   PROTEINUR 30 (A) 05/06/2020 1344   NITRITE NEGATIVE 05/03/2020 1344   LEUKOCYTESUR MODERATE (A) 05/26/2020 1344    Radiological Exams on Admission: DG Chest Portable 1 View  Result Date: 05/14/2020 CLINICAL DATA:  Weakness EXAM: PORTABLE CHEST 1 VIEW COMPARISON:  09/06/2016 chest radiograph and prior. FINDINGS: Hypoinflated lungs. Streaky bibasilar opacities. No pneumothorax. Blunting of the bilateral costophrenic sulci may reflect scarring versus trace effusions. Cardiomediastinal silhouette within normal limits. Multilevel spondylosis. IMPRESSION: Hypoinflated lungs.  Bibasilar linear opacities, likely atelectasis. Trace bilateral pleural effusions versus bibasilar scarring. Electronically Signed   By: Primitivo Gauze M.D.   On: 05/18/2020 14:03    EKG: Independently reviewed.  Sinus rhythm.  No ST elevation or depression  noted.  Assessment/Plan Principal Problem:   Symptomatic anemia Active Problems:   Essential hypertension, benign   Pancytopenia (HCC)   History of kidney stones   CKD (chronic kidney disease), stage III    Symptomatic anemia: -Unknown underlying etiology?  Could be related to bone marrow issues?  POC occult blood negative.  UA negative for hematuria. -Patient's vital signs stable.  CBC shows WBC of 3.9, H&H 2.9/12.3, MCV: 75, platelet: 98. -EDP ordered 2 unit PRBC. -Check iron studies, B12, folate, TSH -EDP consulted hematology-await recommendation.  Monitor H&H closely.   Asymptomatic bacteriuria: -Patient denies any urinary symptoms.  She is afebrile.  No indication of antibiotics at this time.  Syncope: -Likely secondary to symptomatic anemia.  CT head is ordered and is pending. -On telemetry.  Cardiology consulted- recommended transthoracic echo. -BNP: 235, has bilateral leg swelling -Strict INO's and daily weight. -On fall precautions.  Generalized anxiety disorder: Followed by behavioral health -Continue Paxil, Xanax as needed  Bilateral leg swelling: -Fluid overload?.  Reviewed chest x-ray.  BNP elevated at 235.  Check TSH. -Strict INO's and daily weight.  No history of immobilization or recent travel or history of VTE. -Echo is ordered and is pending.   CKD stage IIIb: -Mild decline in GFR from 48-44. -Repeat BMP tomorrow a.m.  History of kidney stones: Followed by urology outpatient.  DVT prophylaxis: SCD, no chemical anticoagulation due to pancytopenia Code Status: Full code Family Communication: None present at bedside.  Plan of care discussed with patient in length and she verbalized understanding and agreed with it. Disposition Plan: Home in 2 to 3 days Consults called: Cardiology and hematology Admission status: Inpatient   Mckinley Jewel MD [facility]  If 7PM-7AM, please contact night-coverage [email or web address] Password [facility]  05/11/2020,  5:39 PM

## 2020-04-30 NOTE — ED Notes (Addendum)
Called lab to check on blood that had been resent to reevaluated Hemeglobin level. Lab stated that another tube needed to be sent. Was sent immediately. This RN called and confirmed that the lab had received the tube after sending it down  Dr. Doristine Bosworth notified of Same. Was given instruction to waiting to hold off Blood Admin until correct hemeglobin amounts come back from the lab

## 2020-04-30 NOTE — Patient Instructions (Signed)
Please proceed to the ER to be evaluated

## 2020-04-30 NOTE — ED Notes (Signed)
Called lab again and they advised that they were running it now. MD notified

## 2020-05-01 ENCOUNTER — Inpatient Hospital Stay (HOSPITAL_COMMUNITY): Payer: Medicare Other

## 2020-05-01 ENCOUNTER — Encounter (HOSPITAL_COMMUNITY): Payer: Self-pay | Admitting: Internal Medicine

## 2020-05-01 ENCOUNTER — Other Ambulatory Visit: Payer: Self-pay

## 2020-05-01 DIAGNOSIS — I361 Nonrheumatic tricuspid (valve) insufficiency: Secondary | ICD-10-CM

## 2020-05-01 DIAGNOSIS — D61818 Other pancytopenia: Secondary | ICD-10-CM

## 2020-05-01 DIAGNOSIS — D509 Iron deficiency anemia, unspecified: Secondary | ICD-10-CM

## 2020-05-01 DIAGNOSIS — I340 Nonrheumatic mitral (valve) insufficiency: Secondary | ICD-10-CM

## 2020-05-01 DIAGNOSIS — D649 Anemia, unspecified: Secondary | ICD-10-CM

## 2020-05-01 DIAGNOSIS — I351 Nonrheumatic aortic (valve) insufficiency: Secondary | ICD-10-CM

## 2020-05-01 DIAGNOSIS — R55 Syncope and collapse: Secondary | ICD-10-CM

## 2020-05-01 LAB — COMPREHENSIVE METABOLIC PANEL
ALT: 29 U/L (ref 0–44)
AST: 38 U/L (ref 15–41)
Albumin: 3.3 g/dL — ABNORMAL LOW (ref 3.5–5.0)
Alkaline Phosphatase: 79 U/L (ref 38–126)
Anion gap: 9 (ref 5–15)
BUN: 16 mg/dL (ref 8–23)
CO2: 22 mmol/L (ref 22–32)
Calcium: 8.6 mg/dL — ABNORMAL LOW (ref 8.9–10.3)
Chloride: 112 mmol/L — ABNORMAL HIGH (ref 98–111)
Creatinine, Ser: 1.03 mg/dL — ABNORMAL HIGH (ref 0.44–1.00)
GFR calc Af Amer: >60 mL/min (ref >60)
GFR calc non Af Amer: 54 mL/min — ABNORMAL LOW (ref >60)
Glucose, Bld: 99 mg/dL (ref 70–99)
Potassium: 3.7 mmol/L (ref 3.5–5.1)
Sodium: 143 mmol/L (ref 135–145)
Total Bilirubin: 3.1 mg/dL — ABNORMAL HIGH (ref 0.3–1.2)
Total Protein: 6.2 g/dL — ABNORMAL LOW (ref 6.5–8.1)

## 2020-05-01 LAB — CBC WITH DIFFERENTIAL/PLATELET
Abs Immature Granulocytes: 0.08 10*3/uL — ABNORMAL HIGH (ref 0.00–0.07)
Basophils Absolute: 0 10*3/uL (ref 0.0–0.1)
Basophils Relative: 0 %
Eosinophils Absolute: 0 10*3/uL (ref 0.0–0.5)
Eosinophils Relative: 1 %
HCT: 25.3 % — ABNORMAL LOW (ref 36.0–46.0)
Hemoglobin: 7.4 g/dL — ABNORMAL LOW (ref 12.0–15.0)
Immature Granulocytes: 1 %
Lymphocytes Relative: 10 %
Lymphs Abs: 0.6 10*3/uL — ABNORMAL LOW (ref 0.7–4.0)
MCH: 23.9 pg — ABNORMAL LOW (ref 26.0–34.0)
MCHC: 29.2 g/dL — ABNORMAL LOW (ref 30.0–36.0)
MCV: 81.6 fL (ref 80.0–100.0)
Monocytes Absolute: 0.7 10*3/uL (ref 0.1–1.0)
Monocytes Relative: 12 %
Neutro Abs: 4.4 10*3/uL (ref 1.7–7.7)
Neutrophils Relative %: 76 %
Platelets: 91 10*3/uL — ABNORMAL LOW (ref 150–400)
RBC: 3.1 MIL/uL — ABNORMAL LOW (ref 3.87–5.11)
RDW: 19 % — ABNORMAL HIGH (ref 11.5–15.5)
WBC: 5.7 10*3/uL (ref 4.0–10.5)
nRBC: 0.5 % — ABNORMAL HIGH (ref 0.0–0.2)

## 2020-05-01 LAB — ECHOCARDIOGRAM COMPLETE
AR max vel: 1.51 cm2
AV Area VTI: 1.58 cm2
AV Area mean vel: 1.53 cm2
AV Mean grad: 13 mmHg
AV Peak grad: 26.4 mmHg
Ao pk vel: 2.57 m/s
Area-P 1/2: 3.6 cm2
Height: 62 in
P 1/2 time: 354 msec
S' Lateral: 3.1 cm
Weight: 2783.09 oz

## 2020-05-01 LAB — MRSA PCR SCREENING: MRSA by PCR: NEGATIVE

## 2020-05-01 LAB — PREPARE RBC (CROSSMATCH)

## 2020-05-01 MED ORDER — SODIUM CHLORIDE 0.9 % IV SOLN
510.0000 mg | Freq: Once | INTRAVENOUS | Status: AC
  Administered 2020-05-01: 510 mg via INTRAVENOUS
  Filled 2020-05-01: qty 17

## 2020-05-01 NOTE — Plan of Care (Signed)
  Problem: Education: ?Goal: Knowledge of General Education information will improve ?Description: Including pain rating scale, medication(s)/side effects and non-pharmacologic comfort measures ?Outcome: Progressing ?  ?Problem: Health Behavior/Discharge Planning: ?Goal: Ability to manage health-related needs will improve ?Outcome: Progressing ?  ?Problem: Clinical Measurements: ?Goal: Ability to maintain clinical measurements within normal limits will improve ?Outcome: Progressing ?Goal: Will remain free from infection ?Outcome: Progressing ?Goal: Respiratory complications will improve ?Outcome: Progressing ?Goal: Cardiovascular complication will be avoided ?Outcome: Progressing ?  ?Problem: Coping: ?Goal: Level of anxiety will decrease ?Outcome: Progressing ?  ?

## 2020-05-01 NOTE — Progress Notes (Signed)
[facility]  PROGRESS NOTE  Regina Richards AQT:[id number] DOB: 16-Dec-1945 DOA: 04/28/2020 PCP: Patient, No Pcp Per   Brief HPI:   74 year old female with medical history of anxiety, hypertension, kidney stone presents to ED for evaluation of exertional shortness of breath for past 3 to 4 weeks.  Patient said that she has been having shortness of breath with walking for past 3 to 4 weeks, also had leg and arm pain on standing.  She was seen by urologist 2 days ago at that time urine was positive for bacteria and she was started on antibiotics for UTI but did not start taking it. In the ED lab work showed hemoglobin of 2.9, MCV 75. Lab work also showed severe iron deficiency with 2% saturation, iron 10, TIBC 514 Patient denies any history of GI bleed.  FOBT was negative in the ED.   Subjective   Patient seen and examined, she received 3 units PRBC yesterday.  Labs are pending this morning.   Assessment/Plan:     1. Severe iron deficiency anemia-patient presented with hemoglobin of 2.7, unclear etiology.  FOBT is negative.  Discussed with hematology, patient will need GI work-up to rule out chronic GI blood loss.  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  Will give 1 dose of IV iron Feraheme 510 mg.  Will consult gastroenterology for EGD and colonoscopy.  Patient also might need bone marrow biopsy at some point. 2. Thrombocytopenia-platelet count 80,000/microliter.  Unclear etiology, she did have low platelets in 2017, thought to be due to sepsis.  It had resolved with last platelet count of 227,000 on 09/06/2016.     COVID-19 Labs  Recent Labs    05/11/2020 1344 05/09/2020 1944  DDIMER 0.70*  --   FERRITIN  --  5*  LDH  --  127    Lab Results  Component Value Date   SARSCOV2NAA NEGATIVE 05/23/2020     Scheduled medications:    CBG: Recent Labs  Lab 05/02/2020 1333  GLUCAP 100*    SpO2: 95 % O2 Flow Rate (L/min): 2 L/min     CBC: Recent Labs  Lab 05/01/2020 1344 05/09/2020 1800  WBC 3.9* 3.6*  NEUTROABS 3.3 3.0  HGB 2.9* 2.7*  HCT 12.3* 11.9*  MCV 75.0* 73.0*  PLT 98* 80*    Basic Metabolic Panel: Recent Labs  Lab 05/08/2020 1344 05/16/2020 1944  NA 141  --   K 3.5  --   CL 110  --   CO2 21*  --   GLUCOSE 125*  --   BUN 19  --   CREATININE 1.22*  --   CALCIUM 8.6*  --   MG  --  2.0  PHOS  --  3.8    Antibiotics: Anti-infectives (From admission, onward)   None       DVT prophylaxis: SCDs  Code Status: Full code  Family Communication: No family at bedside    Status is: Inpatient  Dispo: The patient is from: Home              Anticipated d/c is to: Home              Anticipated d/c date is: 05/04/2020              Patient currently not medically stable for discharge  Barrier to discharge-severe iron deficiency, GI and hematology consulted     Consultants:  Hematology  Gastroenterology  Procedures:  Objective   Vitals:   05/01/20 0228 05/01/20 0445 05/01/20 0450 05/01/20 0800  BP: 138/69  128/66 (!) 157/75  Pulse: 72  72   Resp: (!) 21  15 13   Temp: 98.2 F (36.8 C)  98.5 F (36.9 C) (!) 97.5 F (36.4 C)  TempSrc: Oral  Oral Oral  SpO2: 98%  95%   Weight:  78.9 kg    Height:        Intake/Output Summary (Last 24 hours) at 05/01/2020 0940 Last data filed at 05/01/2020 0450 Gross per 24 hour  Intake 1953 ml  Output 550 ml  Net 1403 ml    09/02 1901 - 09/04 0700 In: 1953 [I.V.:1000] Out: 550 [Urine:550]  Filed Weights   05/25/2020 2351 05/01/20 0445  Weight: 78.9 kg 78.9 kg    Physical Examination:    General: Appears in no acute distress  Cardiovascular: S1-S2, regular, grade 3/6 systolic murmur at the aortic area  Respiratory: Clear to auscultation bilaterally  Abdomen: Abdomen is soft, nontender, no organomegaly  Extremities: Trace edema in the lower extremities  Neurologic: Alert, oriented x3, no focal deficit noted    Data  Reviewed:   Recent Results (from the past 240 hour(s))  SARS Coronavirus 2 by RT PCR (hospital order, performed in [place] hospital lab) Nasopharyngeal Nasopharyngeal Swab     Status: None   Collection Time: 04/29/2020  4:44 PM   Specimen: Nasopharyngeal Swab  Result Value Ref Range Status   SARS Coronavirus 2 NEGATIVE NEGATIVE Final    Comment: (NOTE) SARS-CoV-2 target nucleic acids are NOT DETECTED.  The SARS-CoV-2 RNA is generally detectable in upper and lower respiratory specimens during the acute phase of infection. The lowest concentration of SARS-CoV-2 viral copies this assay can detect is 250 copies / mL. A negative result does not preclude SARS-CoV-2 infection and should not be used as the sole basis for treatment or other patient management decisions.  A negative result may occur with improper specimen collection / handling, submission of specimen other than nasopharyngeal swab, presence of viral mutation(s) within the areas targeted by this assay, and inadequate number of viral copies (<250 copies / mL). A negative result must be combined with clinical observations, patient history, and epidemiological information.  Fact Sheet for Patients:   StrictlyIdeas.no  Fact Sheet for Healthcare Providers: BankingDealers.co.za  This test is not yet approved or  cleared by the [place] FDA and has been authorized for detection and/or diagnosis of SARS-CoV-2 by FDA under an Emergency Use Authorization (EUA).  This EUA will remain in effect (meaning this test can be used) for the duration of the COVID-19 declaration under Section 564(b)(1) of the Act, 21 U.S.C. section 360bbb-3(b)(1), unless the authorization is terminated or revoked sooner.  Performed at [facility], [place] [address] [place], [place] [address]   MRSA PCR Screening     Status: None   Collection Time: 05/01/20 12:01 AM   Specimen: Nasopharyngeal   Result Value Ref Range Status   MRSA by PCR NEGATIVE NEGATIVE Final    Comment:        The GeneXpert MRSA Assay (FDA approved for NASAL specimens only), is one component of a comprehensive MRSA colonization surveillance program. It is not intended to diagnose MRSA infection nor to guide or monitor treatment for MRSA infections. Performed at [facility], [place] [address] [place], [place] [address]     No results for input(s): LIPASE, AMYLASE in the last 168 hours. No results  for input(s): AMMONIA in the last 168 hours.  Cardiac Enzymes: Recent Labs  Lab 05/05/2020 1944  CKTOTAL 64   BNP (last 3 results) Recent Labs    04/29/2020 1555  BNP 235.4*    ProBNP (last 3 results) No results for input(s): PROBNP in the last 8760 hours.  Studies:  CT Head Wo Contrast  Result Date: 05/15/2020 CLINICAL DATA:  Provided history: Syncope, simple, normal neuro exam, seizure-like activity. EXAM: CT HEAD WITHOUT CONTRAST TECHNIQUE: Contiguous axial images were obtained from the base of the skull through the vertex without intravenous contrast. COMPARISON:  Head CT 10/05/2015. FINDINGS: Brain: Mildly motion degraded examination. Mild generalized parenchymal atrophy, progressed as compared to the head CT of 08/03/2016. Redemonstrated chronic cortically based infarct within the left frontal lobe in the left frontal operculum. Stable mild ill-defined hypoattenuation within the cerebral white matter which is nonspecific, but consistent with chronic small vessel ischemic disease. There is no acute intracranial hemorrhage. No acute demarcated cortical infarct. No extra-axial fluid collection. No evidence of intracranial mass. No midline shift. Vascular: No hyperdense vessel. Skull: Normal. Negative for fracture or focal lesion. Sinuses/Orbits: Visualized orbits show no acute finding. Mild mucosal thickening within the inferior right maxillary sinus. No significant mastoid effusion. IMPRESSION: No CT  evidence of acute intracranial abnormality. 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 Stable mild cerebral white matter chronic small vessel ischemic disease. Mild generalized parenchymal atrophy, progressed as compared to the prior CT of 08/03/2016. Mild right maxillary sinus mucosal thickening. Electronically Signed   By: Kellie Simmering DO   On: 05/16/2020 19:08   DG Chest Portable 1 View  Result Date: 05/21/2020 CLINICAL DATA:  Weakness EXAM: PORTABLE CHEST 1 VIEW COMPARISON:  09/06/2016 chest radiograph and prior. FINDINGS: Hypoinflated lungs. Streaky bibasilar opacities. No pneumothorax. Blunting of the bilateral costophrenic sulci may reflect scarring versus trace effusions. Cardiomediastinal silhouette within normal limits. Multilevel spondylosis. IMPRESSION: Hypoinflated lungs.  Bibasilar linear opacities, likely atelectasis. Trace bilateral pleural effusions versus bibasilar scarring. Electronically Signed   By: Primitivo Gauze M.D.   On: 05/22/2020 14:03    [place] [facility]   [facility] If 7PM-7AM, please contact night-coverage at [email or web address], Office  [phone]   05/01/2020, 9:40 AM  LOS: 1 day

## 2020-05-01 NOTE — Progress Notes (Signed)
No lab draw by phlebotomy today, called phlebotomy regarding blood work wasn't done after blood transfusion. Phlebotomy said that night shift phlebotomy didn't do, so she is very busy now, but she will come get blood as soon as possible. [facility] [employer]

## 2020-05-01 NOTE — Plan of Care (Signed)

## 2020-05-01 NOTE — Consult Note (Addendum)
Consultation  Referring Provider:  Marne  Primary Care Physician:  Patient, No Pcp Per Primary Gastroenterologist:  none  Reason for Consultation: Profound anemia, iron deficiency  HPI: Regina Richards is a 74 y.o. female, who was admitted yesterday after presenting to the emergency room with complaints of 1 month history of somewhat progressive shortness of breath and generalized weakness.  She had had a syncopal episode at home.  She has history of hypertension, anxiety and kidney stones. Currently she had recently been seen by urology and was told her UA was positive for bacteria and was to start on antibiotics though she had not started as yet. Work-up in the emergency room with chest x-ray showing hyperinflated lungs bibasilar linear opacity likely atelectasis and trace bilateral effusions. Cardiology was consulted, troponin negative, BNP 235. Echo pending She was found to be profoundly anemic with hemoglobin of 2.7 hematocrit of 11.9 MCV of 73, platelets 80,000 WBC 3.6 INR 1.3 Ferritin 5 iron saturation 2%.  Stool Hemoccult negative She has been transfused 3 units of packed RBCs and is receiving IV Feraheme this morning Hemoglobin up to 7.4 today. CT of the head from yesterday shows chronic small vessel disease.  Patient had been seen by [facility] in 2017 during hospitalization with what was felt to be a segmental ischemic colitis.  She was to have outpatient colonoscopy but never followed up.  She says she has not had any prior EGD or colonoscopy.  Has not been diagnosed with anemia in the past.  The CBC from 2017 showed hemoglobin in the 9-10.7 range.  Patient denies any abdominal pain, she says her appetite has been fair recently, weight overall stable.  She has had a few episodes of nausea, denies any dysphagia or odynophagia.  She has issues with chronic constipation, no recent changes and unaware of any melena or hematochezia.  She recently started a baby aspirin daily,  denies any chronic NSAIDs, BCs etc.  She does drink beer on a daily basis usually 3-4 and has been doing so for a long time. She does relate family history of sister with colon polyps and grandmother with colon cancer. She has not been vaccinated for COVID-19. She is feeling much better today after transfusions.     Past Medical History:  Diagnosis Date  . Anxiety   . Arthritis   . Dysrhythmia    hx of murmur   . History of kidney stones   . Hypertension    off bp meds " for a while"  . Pneumonia last 10 yrs ago   hx of  . PONV (postoperative nausea and vomiting)    hx of    Past Surgical History:  Procedure Laterality Date  . CYSTOSCOPY W/ URETERAL STENT PLACEMENT Bilateral 08/03/2016   Procedure: CYSTOSCOPY WITH BILATERAL STENT REPLACEMENT, BILATERAL RETROGRADE PYELOGRAM;  Surgeon: Kathie Rhodes, MD;  Location: [facility];  Service: Urology;  Laterality: Bilateral;  . CYSTOSCOPY/RETROGRADE/URETEROSCOPY/STONE EXTRACTION WITH BASKET Bilateral 09/08/2016   Procedure: BILATERAL CYSTOSCOPY/RETROGRADE/URETEROSCOPY/STONE EXTRACTION / HOLMIUM LASER /STENT REMOVAL;  Surgeon: Kathie Rhodes, MD;  Location: [facility];  Service: Urology;  Laterality: Bilateral;  . CYSTOSCOPY/URETEROSCOPY/HOLMIUM LASER/STENT PLACEMENT Right 09/23/2018   Procedure: CYSTOSCOPY/RETROGRADE/URETEROSCOPY/STENT PLACEMENT RIGHT ;  Surgeon: Kathie Rhodes, MD;  Location: [facility];  Service: Urology;  Laterality: Right;  . polyp removed from uterus      Prior to Admission medications   Medication Sig Start Date End Date Taking? Authorizing Provider  PARoxetine (PAXIL) 40 MG tablet TAKE  1 TABLET(40 MG) BY MOUTH EVERY MORNING Patient taking differently: Take 40 mg by mouth daily.  05/20/2020  Yes Donnal Moat T, PA-C  ciprofloxacin (CIPRO) 500 MG tablet Take 500 mg by mouth 2 (two) times daily. 05/10/2020   [provider]    Current Facility-Administered Medications  Medication Dose Route Frequency Provider  Last Rate Last Admin  . 0.9 %  sodium chloride infusion   Intravenous Continuous Oswald Hillock, MD 10 mL/hr at 05/01/20 1017 Rate Change at 05/01/20 1017  . acetaminophen (TYLENOL) tablet 650 mg  650 mg Oral Q6H PRN Pahwani, Rinka R, MD       Or  . acetaminophen (TYLENOL) suppository 650 mg  650 mg Rectal Q6H PRN Pahwani, Rinka R, MD      . ferumoxytol (FERAHEME) 510 mg in sodium chloride 0.9 % 100 mL IVPB  510 mg Intravenous Once [place], Gagan S, MD      . ondansetron (ZOFRAN) tablet 4 mg  4 mg Oral Q6H PRN Pahwani, Rinka R, MD       Or  . ondansetron (ZOFRAN) injection 4 mg  4 mg Intravenous Q6H PRN Pahwani, Rinka R, MD        Allergies as of 05/18/2020 - Review Complete 05/26/2020  Allergen Reaction Noted  . Sulfa antibiotics Rash 04/29/2020    History reviewed. No pertinent family history.  Social History   Socioeconomic History  . Marital status: Divorced    Spouse name: Not on file  . Number of children: Not on file  . Years of education: Not on file  . Highest education level: Not on file  Occupational History  . Not on file  Tobacco Use  . Smoking status: Never Smoker  . Smokeless tobacco: Never Used  Vaping Use  . Vaping Use: Never used  Substance and Sexual Activity  . Alcohol use: Yes    Alcohol/week: 5.0 standard drinks    Types: 5 Cans of beer per week  . Drug use: No  . Sexual activity: Never  Other Topics Concern  . Not on file  Social History Narrative  . Not on file   Social Determinants of Health   Financial Resource Strain:   . Difficulty of Paying Living Expenses: Not on file  Food Insecurity:   . Worried About Charity fundraiser in the Last Year: Not on file  . Ran Out of Food in the Last Year: Not on file  Transportation Needs:   . Lack of Transportation (Medical): Not on file  . Lack of Transportation (Non-Medical): Not on file  Physical Activity:   . Days of Exercise per Week: Not on file  . Minutes of Exercise per Session: Not on file    Stress:   . Feeling of Stress : Not on file  Social Connections:   . Frequency of Communication with Friends and Family: Not on file  . Frequency of Social Gatherings with Friends and Family: Not on file  . Attends Religious Services: Not on file  . Active Member of Clubs or Organizations: Not on file  . Attends Archivist Meetings: Not on file  . Marital Status: Not on file  Intimate Partner Violence:   . Fear of Current or Ex-Partner: Not on file  . Emotionally Abused: Not on file  . Physically Abused: Not on file  . Sexually Abused: Not on file    Review of Systems: Pertinent positive and negative review of systems were noted in the above HPI  section.  All other review of systems was otherwise negative.  Physical Exam: Vital signs in last 24 hours: Temp:  [97.3 F (36.3 C)-98.5 F (36.9 C)] 97.5 F (36.4 C) (09/04 0800) Pulse Rate:  [60-95] 72 (09/04 0450) Resp:  [13-25] 13 (09/04 0800) BP: (106-170)/(51-84) 157/75 (09/04 0800) SpO2:  [93 %-100 %] 95 % (09/04 0450) Weight:  [78.9 kg] 78.9 kg (09/04 0445) Last BM Date: 05/10/2020 General:   Alert,  Well-developed, well-nourished, older white female pleasant and cooperative in NAD Head:  Normocephalic and atraumatic. Eyes:  Sclera clear, no icterus.   Conjunctiva pale Ears:  Normal auditory acuity. Nose:  No deformity, discharge,  or lesions. Mouth:  No deformity or lesions.   Neck:  Supple; no masses or thyromegaly. Lungs: Few basilar rhonchi  heart:  Regular rate and rhythm; systolic murmur Abdomen:  Soft,nontender, BS active,nonpalp mass or hsm.   Rectal:  Deferred documented Hemoccult negative on admit Msk:  Symmetrical without gross deformities. . Pulses:  Normal pulses noted. Extremities: 1+ edema bilateral lower extremities Neurologic:  Alert and  oriented x4;  grossly normal neurologically. Skin:  Intact without significant lesions or rashes.. Psych:  Alert and cooperative. Normal mood and  affect.  Intake/Output from previous day: 09/03 0701 - 09/04 0700 In: 1953 [I.V.:1000; Blood:953] Out: 550 [Urine:550] Intake/Output this shift: Total I/O In: 1001.1 [I.V.:1001.1] Out: -   Lab Results: Recent Labs    05/06/2020 1344 05/01/2020 1800  WBC 3.9* 3.6*  HGB 2.9* 2.7*  HCT 12.3* 11.9*  PLT 98* 80*   BMET Recent Labs    05/10/2020 1344  NA 141  K 3.5  CL 110  CO2 21*  GLUCOSE 125*  BUN 19  CREATININE 1.22*  CALCIUM 8.6*   LFT No results for input(s): PROT, ALBUMIN, AST, ALT, ALKPHOS, BILITOT, BILIDIR, IBILI in the last 72 hours. PT/INR Recent Labs    05/26/2020 1944  LABPROT 15.9*  INR 1.3*     IMPRESSION:  #81 74 year old white female admitted with 1 month history of progressive generalized weakness, extremity weakness and shortness of breath.  Found to be profoundly anemic with hemoglobin of 2.7 and iron deficient, Hemoccult negative. Also noted to have thrombocytopenia with platelet count of 80,000.  Patient stable and feeling better post transfusions x3  No prior GI evaluation. Etiology of anemia iron deficiency and thrombocytopenia are not clear.  We will need to rule out underlying chronic GI blood loss, patient with history of chronic EtOH use, rule out underlying liver disease  Rule out myelodysplastic disorder  #2 history of nephrolithiasis #3 syncope secondary to above #4 UTI  PLAN: Full liquid diet today Schedule abdominal ultrasound Patient will need EGD and colonoscopy this admission.  Will discuss timing and possibly schedule this weekend.  I discussed EGD and colonoscopy in detail with the patient including indications risks and benefits and she is agreeable to proceed. Trend hemoglobin Await hematology consultation. Thank you will follow with you   Amy EsterwoodPA-C  05/01/2020, 10:29 AM      Attending Physician Note   I have taken a history, examined the patient and reviewed the chart. I agree with the Advanced Practitioner's  note, impression and recommendations.  Severe iron deficiency anemia (Hgb=2.7) with Hemoccult negative stool. R/O GI tract neoplasm, ulcer, AVM, celiac disease, etc. Check celiac disease Ab. Colonoscopy and EGD at some point soon - during hospitalization or as an outpatient shortly after discharge. Agree with IV iron replacement. Await hematology consultation.   Thrombocytopenia, leukopenia.  R/O underlying liver disease. Abd [place]. Await hematology consultation   Lucio Edward, MD [facility]

## 2020-05-01 NOTE — Progress Notes (Signed)
  Echocardiogram 2D Echocardiogram has been performed.  Regina Richards 05/01/2020, 1:54 PM

## 2020-05-01 NOTE — Progress Notes (Signed)
Progress Note  Patient Name: Regina Richards Date of Encounter: 05/01/2020  [facility] Cardiologist: Donato Heinz, MD   Subjective    Reports feeling markedly improved today.  Received 3 units PRBCs.    Inpatient Medications    Scheduled Meds:  Continuous Infusions: . sodium chloride 125 mL/hr at 05/26/2020 1407  . ferumoxytol     PRN Meds: acetaminophen **OR** acetaminophen, ondansetron **OR** ondansetron (ZOFRAN) IV   Vital Signs    Vitals:   05/01/20 0228 05/01/20 0445 05/01/20 0450 05/01/20 0800  BP: 138/69  128/66 (!) 157/75  Pulse: 72  72   Resp: (!) 21  15 13   Temp: 98.2 F (36.8 C)  98.5 F (36.9 C) (!) 97.5 F (36.4 C)  TempSrc: Oral  Oral Oral  SpO2: 98%  95%   Weight:  78.9 kg    Height:        Intake/Output Summary (Last 24 hours) at 05/01/2020 1009 Last data filed at 05/01/2020 0450 Gross per 24 hour  Intake 1953 ml  Output 550 ml  Net 1403 ml   Last 3 Weights 05/01/2020 05/21/2020 05/15/2020  Weight (lbs) 173 lb 15.1 oz 173 lb 15.1 oz 173 lb 6.4 oz  Weight (kg) 78.9 kg 78.9 kg 78.654 kg      Telemetry    Sinus rhythm 40-60s - Personally Reviewed  ECG    No new ECG- Personally Reviewed  Physical Exam   GEN: No acute distress.   Neck: + JVD Cardiac: RRR, 2/6 systolic heart murmur Respiratory: Clear to auscultation bilaterally. GI: Soft, nontender, non-distended  MS: 1+ BLE edema Neuro:  Nonfocal  Psych: Normal affect   Labs    High Sensitivity Troponin:   Recent Labs  Lab 05/16/2020 1344 05/07/2020 1545  TROPONINIHS 12 12      Chemistry Recent Labs  Lab 05/11/2020 1344  NA 141  K 3.5  CL 110  CO2 21*  GLUCOSE 125*  BUN 19  CREATININE 1.22*  CALCIUM 8.6*  GFRNONAA 44*  GFRAA 51*  ANIONGAP 10     Hematology Recent Labs  Lab 05/04/2020 1344 05/05/2020 1800 05/23/2020 1944  WBC 3.9* 3.6*  --   RBC 1.64* 1.63* 1.67*  HGB 2.9* 2.7*  --   HCT 12.3* 11.9*  --   MCV 75.0* 73.0*  --   MCH 17.7* 16.6*  --   MCHC 23.6*  22.7*  --   RDW 18.2* 18.3*  --   PLT 98* 80*  --     BNP Recent Labs  Lab 05/14/2020 1555  BNP 235.4*     DDimer  Recent Labs  Lab 05/23/2020 1344  DDIMER 0.70*     Radiology    CT Head Wo Contrast  Result Date: 05/07/2020 CLINICAL DATA:  Provided history: Syncope, simple, normal neuro exam, seizure-like activity. EXAM: CT HEAD WITHOUT CONTRAST TECHNIQUE: Contiguous axial images were obtained from the base of the skull through the vertex without intravenous contrast. COMPARISON:  Head CT 10/05/2015. FINDINGS: Brain: Mildly motion degraded examination. Mild generalized parenchymal atrophy, progressed as compared to the head CT of 08/03/2016. Redemonstrated chronic cortically based infarct within the left frontal lobe in the left frontal operculum. Stable mild ill-defined hypoattenuation within the cerebral white matter which is nonspecific, but consistent with chronic small vessel ischemic disease. There is no acute intracranial hemorrhage. No acute demarcated cortical infarct. No extra-axial fluid collection. No evidence of intracranial mass. No midline shift. Vascular: No hyperdense vessel. Skull: Normal. Negative for fracture  or focal lesion. Sinuses/Orbits: Visualized orbits show no acute finding. Mild mucosal thickening within the inferior right maxillary sinus. No significant mastoid effusion. IMPRESSION: No CT evidence of acute intracranial abnormality. 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 Stable mild cerebral white matter chronic small vessel ischemic disease. Mild generalized parenchymal atrophy, progressed as compared to the prior CT of 08/03/2016. Mild right maxillary sinus mucosal thickening. Electronically Signed   By: Kellie Simmering DO   On: 05/13/2020 19:08   DG Chest Portable 1 View  Result Date: 05/15/2020 CLINICAL DATA:  Weakness EXAM: PORTABLE CHEST 1 VIEW COMPARISON:  09/06/2016 chest radiograph and prior. FINDINGS: Hypoinflated lungs. Streaky  bibasilar opacities. No pneumothorax. Blunting of the bilateral costophrenic sulci may reflect scarring versus trace effusions. Cardiomediastinal silhouette within normal limits. Multilevel spondylosis. IMPRESSION: Hypoinflated lungs.  Bibasilar linear opacities, likely atelectasis. Trace bilateral pleural effusions versus bibasilar scarring. Electronically Signed   By: Primitivo Gauze M.D.   On: 05/01/2020 14:03    Cardiac Studies     Patient Profile     74 y.o. female with a history of hypertension who presents with syncope and found to have severe iron deficiency anemia  Assessment & Plan    Syncope: Due to severe iron deficiency anemia, with hemoglobin down to 2.7.  S/p 3 units PRBCs.  GI and hematology consulted.  Does have systolic heart murmur and JVD/edema on exam, will f/u results of echocardiogram.  For questions or updates, please contact [place] Please consult [email or web address] for contact info under        Signed, Donato Heinz, MD  05/01/2020, 10:09 AM

## 2020-05-01 NOTE — Consult Note (Signed)
[place] [phone]   [phone]  INITIAL CONSULT NOTE  Patient Care Team: Patient, No Pcp Per as PCP - General (General Practice) Donato Heinz, MD as PCP - Cardiology (Cardiology)  CHIEF COMPLAINTS/PURPOSE OF CONSULTATION:  "Pancytopenia with severe anemia "  HISTORY OF PRESENTING ILLNESS:  Regina Richards 74 y.o. female with medical history significant for anxiety, HTN, and arthritis who presented with fatigue and was found to have pancytopenia with a Hgb of 2.7.   On review of the previous records Regina Richards has a longstanding history of low-grade anemia dating back to at least 08/03/2016.  At that time she was noted to have a white blood cell count of 5.9, hemoglobin 10.8, and a platelet count of 41. This was in the setting of acute encephalopathy with septic shock secondary to infected bilateral obstructive renal stone required intubation and monitored in ICU on pressors.  He was noted to have a CBC drawn on 09/06/2016 at which time she had a white blood cell count 8.3, hemoglobin 9.5, and platelets rebounded to normal levels of 227.  The patient presented to the emergency department on 05/26/2020 with complaints of shortness of breath and fatigue which have been aggressively worsening.  Her CBC showed a white blood cell count 3.9, hemoglobin of 2.9, and a platelet count of 98.  Due to concern for this pancytopenia she was admitted the hospital and hematology was consulted for further evaluation and management.  On exam today Regina Richards is that her symptoms began several weeks ago with increasing shortness of breath and aching in her arms and legs.  She notes that this was also associated with some lightheadedness and dizziness.  She denies having any overt signs of bleeding such as nosebleeds, bruising, or dark stools.  She does endorse having cravings for ice "for years now".  She notes that she has not undergone colonoscopy and denies having  ever used fecal stool cards.  On further discussion she reports that she eats a relatively normal diet consisting of red meat at least a few times per week.  She notes that she does eat eggs, and cheese with a muffin for breakfast and that she often skips lunch, but typically has some form of meat with dinner.  She is a never smoker though does endorse having approximately 1-2 beers per day.  She denies ever having issues with her blood before, however having to take iron, or issues with her gastrointestinal health.  She denies fevers, chills, sweats, nausea, vomiting or diarrhea at this time.  A full 10 point ROS is listed below.  MEDICAL HISTORY:  Past Medical History:  Diagnosis Date  . Anxiety   . Arthritis   . Dysrhythmia    hx of murmur   . History of kidney stones   . Hypertension    off bp meds " for a while"  . Pneumonia last 10 yrs ago   hx of  . PONV (postoperative nausea and vomiting)    hx of    SURGICAL HISTORY: Past Surgical History:  Procedure Laterality Date  . CYSTOSCOPY W/ URETERAL STENT PLACEMENT Bilateral 08/03/2016   Procedure: CYSTOSCOPY WITH BILATERAL STENT REPLACEMENT, BILATERAL RETROGRADE PYELOGRAM;  Surgeon: Kathie Rhodes, MD;  Location: [facility];  Service: Urology;  Laterality: Bilateral;  . CYSTOSCOPY/RETROGRADE/URETEROSCOPY/STONE EXTRACTION WITH BASKET Bilateral 09/08/2016   Procedure: BILATERAL CYSTOSCOPY/RETROGRADE/URETEROSCOPY/STONE EXTRACTION / HOLMIUM LASER /STENT REMOVAL;  Surgeon: Kathie Rhodes, MD;  Location: [facility];  Service: Urology;  Laterality:  Bilateral;  . CYSTOSCOPY/URETEROSCOPY/HOLMIUM LASER/STENT PLACEMENT Right 09/23/2018   Procedure: CYSTOSCOPY/RETROGRADE/URETEROSCOPY/STENT PLACEMENT RIGHT ;  Surgeon: Kathie Rhodes, MD;  Location: [facility];  Service: Urology;  Laterality: Right;  . polyp removed from uterus      SOCIAL HISTORY: Social History   Socioeconomic History  . Marital status: Divorced    Spouse name: Not on file   . Number of children: Not on file  . Years of education: Not on file  . Highest education level: Not on file  Occupational History  . Not on file  Tobacco Use  . Smoking status: Never Smoker  . Smokeless tobacco: Never Used  Vaping Use  . Vaping Use: Never used  Substance and Sexual Activity  . Alcohol use: Yes    Alcohol/week: 5.0 standard drinks    Types: 5 Cans of beer per week  . Drug use: No  . Sexual activity: Never  Other Topics Concern  . Not on file  Social History Narrative  . Not on file   Social Determinants of Health   Financial Resource Strain:   . Difficulty of Paying Living Expenses: Not on file  Food Insecurity:   . Worried About Charity fundraiser in the Last Year: Not on file  . Ran Out of Food in the Last Year: Not on file  Transportation Needs:   . Lack of Transportation (Medical): Not on file  . Lack of Transportation (Non-Medical): Not on file  Physical Activity:   . Days of Exercise per Week: Not on file  . Minutes of Exercise per Session: Not on file  Stress:   . Feeling of Stress : Not on file  Social Connections:   . Frequency of Communication with Friends and Family: Not on file  . Frequency of Social Gatherings with Friends and Family: Not on file  . Attends Religious Services: Not on file  . Active Member of Clubs or Organizations: Not on file  . Attends Archivist Meetings: Not on file  . Marital Status: Not on file  Intimate Partner Violence:   . Fear of Current or Ex-Partner: Not on file  . Emotionally Abused: Not on file  . Physically Abused: Not on file  . Sexually Abused: Not on file    FAMILY HISTORY: History reviewed. No pertinent family history.  ALLERGIES:  is allergic to sulfa antibiotics.  MEDICATIONS:  Current Facility-Administered Medications  Medication Dose Route Frequency Provider Last Rate Last Admin  . 0.9 %  sodium chloride infusion   Intravenous Continuous Oswald Hillock, MD 10 mL/hr at 05/01/20  1017 Rate Change at 05/01/20 1017  . acetaminophen (TYLENOL) tablet 650 mg  650 mg Oral Q6H PRN Pahwani, Rinka R, MD       Or  . acetaminophen (TYLENOL) suppository 650 mg  650 mg Rectal Q6H PRN Pahwani, Rinka R, MD      . ondansetron (ZOFRAN) tablet 4 mg  4 mg Oral Q6H PRN Pahwani, Rinka R, MD       Or  . ondansetron (ZOFRAN) injection 4 mg  4 mg Intravenous Q6H PRN Pahwani, Rinka R, MD        REVIEW OF SYSTEMS:   Constitutional: ( - ) fevers, ( - )  chills , ( - ) night sweats Eyes: ( - ) blurriness of vision, ( - ) double vision, ( - ) watery eyes Ears, nose, mouth, throat, and face: ( - ) mucositis, ( - ) sore throat Respiratory: ( - )  cough, ( - ) dyspnea, ( - ) wheezes Cardiovascular: ( - ) palpitation, ( - ) chest discomfort, ( - ) lower extremity swelling Gastrointestinal:  ( - ) nausea, ( - ) heartburn, ( - ) change in bowel habits Skin: ( - ) abnormal skin rashes Lymphatics: ( - ) new lymphadenopathy, ( - ) easy bruising Neurological: ( - ) numbness, ( - ) tingling, ( - ) new weaknesses Behavioral/Psych: ( - ) mood change, ( - ) new changes  All other systems were reviewed with the patient and are negative.  PHYSICAL EXAMINATION: ECOG PERFORMANCE STATUS: 1 - Symptomatic but completely ambulatory  Vitals:   05/01/20 0800 05/01/20 1203  BP: (!) 157/75 (!) 145/66  Pulse:    Resp: 13 19  Temp: (!) 97.5 F (36.4 C) 97.6 F (36.4 C)  SpO2:  92%   Filed Weights   05/13/2020 2351 05/01/20 0445  Weight: 173 lb 15.1 oz (78.9 kg) 173 lb 15.1 oz (78.9 kg)    GENERAL: well appearing elderly Caucasian female in NAD  SKIN: skin color, texture, turgor are normal, no rashes or significant lesions EYES: conjunctiva are pink and non-injected, sclera clear LUNGS: clear to auscultation and percussion with normal breathing effort HEART: regular rate & rhythm and no murmurs and no lower extremity edema ABDOMEN: soft, non-tender, non-distended, normal bowel sounds Musculoskeletal: no  cyanosis of digits and no clubbing  PSYCH: alert & oriented x 3, fluent speech NEURO: no focal motor/sensory deficits  LABORATORY DATA:  I have reviewed the data as listed CBC Latest Ref Rng & Units 05/01/2020 05/25/2020 05/22/2020  WBC 4.0 - 10.5 K/uL 5.7 3.6(L) 3.9(L)  Hemoglobin 12.0 - 15.0 g/dL 7.4(L) 2.7(LL) 2.9(LL)  Hematocrit 36 - 46 % 25.3(L) 11.9(L) 12.3(L)  Platelets 150 - 400 K/uL 91(L) 80(L) 98(L)    CMP Latest Ref Rng & Units 05/01/2020 05/15/2020 09/23/2018  Glucose 70 - 99 mg/dL 99 125(H) 114(H)  BUN 8 - 23 mg/dL 16 19 -  Creatinine 0.44 - 1.00 mg/dL 1.03(H) 1.22(H) -  Sodium 135 - 145 mmol/L 143 141 144  Potassium 3.5 - 5.1 mmol/L 3.7 3.5 3.1(L)  Chloride 98 - 111 mmol/L 112(H) 110 -  CO2 22 - 32 mmol/L 22 21(L) -  Calcium 8.9 - 10.3 mg/dL 8.6(L) 8.6(L) -  Total Protein 6.5 - 8.1 g/dL 6.2(L) - -  Total Bilirubin 0.3 - 1.2 mg/dL 3.1(H) - -  Alkaline Phos 38 - 126 U/L 79 - -  AST 15 - 41 U/L 38 - -  ALT 0 - 44 U/L 29 - -    BLOOD FILM: Pending  RADIOGRAPHIC STUDIES:  CT Head Wo Contrast  Result Date: 05/09/2020 CLINICAL DATA:  Provided history: Syncope, simple, normal neuro exam, seizure-like activity. EXAM: CT HEAD WITHOUT CONTRAST TECHNIQUE: Contiguous axial images were obtained from the base of the skull through the vertex without intravenous contrast. COMPARISON:  Head CT 10/05/2015. FINDINGS: Brain: Mildly motion degraded examination. Mild generalized parenchymal atrophy, progressed as compared to the head CT of 08/03/2016. Redemonstrated chronic cortically based infarct within the left frontal lobe in the left frontal operculum. Stable mild ill-defined hypoattenuation within the cerebral white matter which is nonspecific, but consistent with chronic small vessel ischemic disease. There is no acute intracranial hemorrhage. No acute demarcated cortical infarct. No extra-axial fluid collection. No evidence of intracranial mass. No midline shift. Vascular: No hyperdense  vessel. Skull: Normal. Negative for fracture or focal lesion. Sinuses/Orbits: Visualized orbits show no acute finding. Mild mucosal  thickening within the inferior right maxillary sinus. No significant mastoid effusion. IMPRESSION: No CT evidence of acute intracranial abnormality. 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 Stable mild cerebral white matter chronic small vessel ischemic disease. Mild generalized parenchymal atrophy, progressed as compared to the prior CT of 08/03/2016. Mild right maxillary sinus mucosal thickening. Electronically Signed   By: Kellie Simmering DO   On: 05/26/2020 19:08   DG Chest Portable 1 View  Result Date: 05/02/2020 CLINICAL DATA:  Weakness EXAM: PORTABLE CHEST 1 VIEW COMPARISON:  09/06/2016 chest radiograph and prior. FINDINGS: Hypoinflated lungs. Streaky bibasilar opacities. No pneumothorax. Blunting of the bilateral costophrenic sulci may reflect scarring versus trace effusions. Cardiomediastinal silhouette within normal limits. Multilevel spondylosis. IMPRESSION: Hypoinflated lungs.  Bibasilar linear opacities, likely atelectasis. Trace bilateral pleural effusions versus bibasilar scarring. Electronically Signed   By: Primitivo Gauze M.D.   On: 05/01/2020 14:03    ASSESSMENT & PLAN Cambrie Sonnenfeld Garrels 74 y.o. female with medical history significant for anxiety, HTN, and arthritis who presented with fatigue and was found to have pancytopenia with a Hgb of 2.7.  Review the labs, review the imaging, and discussion with the patient the findings are most consistent with a markedly severe iron deficiency anemia.  The etiology of the patient's iron deficiency anemia is not entirely clear based off our initial assessment.  I would recommend GI evaluation for consideration of EGD and colonoscopy in order to help rule out GI bleed as the cause of her current findings.  Additional portions of her work-up at this time have been unrevealing.  She has normal levels  of LDH, vitamin B12, and folate.  Haptoglobin is currently pending at this time.  There is no clear evidence of a hemolytic anemia or alternative nutritional deficiency.  Myelodysplastic syndrome or other bone marrow dysfunction but certainly be on the differential, though I would not recommend pursuing bone marrow biopsy until the iron deficiency was corrected and shown not to be the cause of her current findings.  Hematology will continue to follow while this patient is in house.   #Pancytopenia # Severe Iron Deficiency Anemia # Mild Thrombocytopenia --findings at this time are most consistent with a severe iron deficiency anemia. Strong suspicion for slow GI bleed.  --recommend consult to GI for consideration of EGD/Colonoscopy for source of blood loss and evaluate liver dysfunction.  --please set transfusion goal of Hgb >7.0 and Plt >10 --today 546m of IV feraheme was ordered. Next dose can be administered in 7 days time.  --LDH within normal limits. Haptoglobin pending. Vitamin B12/folate WNL. --If Hgb levels fail to improve with iron repletion can consider further evaluation for bone marrow dysfunction with a bone marrow biopsy. This is not indicated at this time, though MDS is certainly on the differential for this pancytopenia.  --thrombocytopenia possibly due to consumption vs liver dysfunction. In severe enough cases of iron deficiency you can see pancytopenia.  --Hematology will continue to follow. Please notify our team when d/c is planned so we can set up outpatient f/u.   All questions were answered. The patient knows to call the clinic with any problems, questions or concerns.  A total of more than 55 minutes were spent on this encounter and over half of that time was spent on counseling and coordination of care as outlined above.   JLedell Peoples MD Department of Hematology/Oncology [facility]at [facility] PartnershipPhone: [phone]:  [phone]: [email or web address]  05/01/2020 1:13 PM

## 2020-05-02 DIAGNOSIS — D509 Iron deficiency anemia, unspecified: Secondary | ICD-10-CM | POA: Diagnosis present

## 2020-05-02 DIAGNOSIS — D696 Thrombocytopenia, unspecified: Secondary | ICD-10-CM | POA: Diagnosis present

## 2020-05-02 DIAGNOSIS — R001 Bradycardia, unspecified: Secondary | ICD-10-CM

## 2020-05-02 LAB — COMPREHENSIVE METABOLIC PANEL
ALT: 30 U/L (ref 0–44)
AST: 38 U/L (ref 15–41)
Albumin: 2.9 g/dL — ABNORMAL LOW (ref 3.5–5.0)
Alkaline Phosphatase: 78 U/L (ref 38–126)
Anion gap: 6 (ref 5–15)
BUN: 16 mg/dL (ref 8–23)
CO2: 25 mmol/L (ref 22–32)
Calcium: 8.7 mg/dL — ABNORMAL LOW (ref 8.9–10.3)
Chloride: 113 mmol/L — ABNORMAL HIGH (ref 98–111)
Creatinine, Ser: 0.97 mg/dL (ref 0.44–1.00)
GFR calc Af Amer: >60 mL/min (ref >60)
GFR calc non Af Amer: 58 mL/min — ABNORMAL LOW (ref >60)
Glucose, Bld: 127 mg/dL — ABNORMAL HIGH (ref 70–99)
Potassium: 3.5 mmol/L (ref 3.5–5.1)
Sodium: 144 mmol/L (ref 135–145)
Total Bilirubin: 2.1 mg/dL — ABNORMAL HIGH (ref 0.3–1.2)
Total Protein: 5.7 g/dL — ABNORMAL LOW (ref 6.5–8.1)

## 2020-05-02 LAB — TYPE AND SCREEN
ABO/RH(D): O POS
Antibody Screen: NEGATIVE
Unit division: 0
Unit division: 0
Unit division: 0

## 2020-05-02 LAB — BPAM RBC
Blood Product Expiration Date: 202109262359
Blood Product Expiration Date: 202110052359
Blood Product Expiration Date: 202110062359
ISSUE DATE / TIME: 202109032007
ISSUE DATE / TIME: 202109032320
ISSUE DATE / TIME: 202109040202
Unit Type and Rh: 5100
Unit Type and Rh: 5100
Unit Type and Rh: 5100

## 2020-05-02 LAB — CBC
HCT: 24 % — ABNORMAL LOW (ref 36.0–46.0)
Hemoglobin: 7.2 g/dL — ABNORMAL LOW (ref 12.0–15.0)
MCH: 24.9 pg — ABNORMAL LOW (ref 26.0–34.0)
MCHC: 30 g/dL (ref 30.0–36.0)
MCV: 83 fL (ref 80.0–100.0)
Platelets: 84 10*3/uL — ABNORMAL LOW (ref 150–400)
RBC: 2.89 MIL/uL — ABNORMAL LOW (ref 3.87–5.11)
RDW: 19.7 % — ABNORMAL HIGH (ref 11.5–15.5)
WBC: 5.5 10*3/uL (ref 4.0–10.5)
nRBC: 0.4 % — ABNORMAL HIGH (ref 0.0–0.2)

## 2020-05-02 LAB — IGA: IgA: 188 mg/dL (ref 64–422)

## 2020-05-02 LAB — HAPTOGLOBIN: Haptoglobin: 110 mg/dL (ref 42–346)

## 2020-05-02 MED ORDER — PEG-KCL-NACL-NASULF-NA ASC-C 100 G PO SOLR: 1.0000 | Freq: Once | ORAL | Status: DC

## 2020-05-02 MED ORDER — PEG-KCL-NACL-NASULF-NA ASC-C 100 G PO SOLR
0.5000 | Freq: Once | ORAL | Status: AC
  Administered 2020-05-02: 100 g via ORAL
  Filled 2020-05-02: qty 1

## 2020-05-02 NOTE — Progress Notes (Signed)
Progress Note  Patient Name: Sari Cogan Markgraf Date of Encounter: 05/02/2020  [facility] Cardiologist: Donato Heinz, MD   Subjective    Reports feeling markedly improved.  Denies any dyspnea  Inpatient Medications    Scheduled Meds: . peg 3350 powder  0.5 kit Oral Once   And  . peg 3350 powder  0.5 kit Oral Once   Continuous Infusions: . sodium chloride 10 mL/hr at 05/01/20 1017   PRN Meds: acetaminophen **OR** acetaminophen, ondansetron **OR** ondansetron (ZOFRAN) IV   Vital Signs    Vitals:   05/01/20 2215 05/01/20 2300 05/02/20 0501 05/02/20 0742  BP: 129/61 (!) 128/57 136/67 (!) 146/63  Pulse:  (!) 57 65   Resp: 14 12  18   Temp: 98.2 F (36.8 C) 98.3 F (36.8 C) 97.6 F (36.4 C) 97.7 F (36.5 C)  TempSrc: Oral Oral Oral Oral  SpO2: 98% 99% 99% 91%  Weight:   80.1 kg   Height:        Intake/Output Summary (Last 24 hours) at 05/02/2020 1033 Last data filed at 05/02/2020 0700 Gross per 24 hour  Intake 337 ml  Output 1650 ml  Net -1313 ml   Last 3 Weights 05/02/2020 05/01/2020 05/11/2020  Weight (lbs) 176 lb 9.4 oz 173 lb 15.1 oz 173 lb 15.1 oz  Weight (kg) 80.1 kg 78.9 kg 78.9 kg      Telemetry    Sinus rhythm, rates down to 40s- Personally Reviewed  ECG    No new ECG- Personally Reviewed  Physical Exam   GEN: No acute distress.   Neck: + JVD Cardiac: RRR, 2/6 systolic heart murmur Respiratory: Clear to auscultation bilaterally. GI: Soft, nontender, non-distended  MS: 1+ BLE edema Neuro:  Nonfocal  Psych: Normal affect   Labs    High Sensitivity Troponin:   Recent Labs  Lab 05/19/2020 1344 05/13/2020 1545  TROPONINIHS 12 12      Chemistry Recent Labs  Lab 05/19/2020 1344 05/01/20 1010 05/02/20 0234  NA 141 143 144  K 3.5 3.7 3.5  CL 110 112* 113*  CO2 21* 22 25  GLUCOSE 125* 99 127*  BUN 19 16 16   CREATININE 1.22* 1.03* 0.97  CALCIUM 8.6* 8.6* 8.7*  PROT  --  6.2* 5.7*  ALBUMIN  --  3.3* 2.9*  AST  --  38 38  ALT  --  29  30  ALKPHOS  --  79 78  BILITOT  --  3.1* 2.1*  GFRNONAA 44* 54* 58*  GFRAA 51* >60 >60  ANIONGAP 10 9 6      Hematology Recent Labs  Lab 05/04/2020 1800 04/28/2020 1944 05/01/20 1010 05/02/20 0234  WBC 3.6*  --  5.7 5.5  RBC 1.63* 1.67* 3.10* 2.89*  HGB 2.7*  --  7.4* 7.2*  HCT 11.9*  --  25.3* 24.0*  MCV 73.0*  --  81.6 83.0  MCH 16.6*  --  23.9* 24.9*  MCHC 22.7*  --  29.2* 30.0  RDW 18.3*  --  19.0* 19.7*  PLT 80*  --  91* 84*    BNP Recent Labs  Lab 05/01/2020 1555  BNP 235.4*     DDimer  Recent Labs  Lab 05/19/2020 1344  DDIMER 0.70*     Radiology    CT Head Wo Contrast  Result Date: 05/11/2020 CLINICAL DATA:  Provided history: Syncope, simple, normal neuro exam, seizure-like activity. EXAM: CT HEAD WITHOUT CONTRAST TECHNIQUE: Contiguous axial images were obtained from the base of the  skull through the vertex without intravenous contrast. COMPARISON:  Head CT 10/05/2015. FINDINGS: Brain: Mildly motion degraded examination. Mild generalized parenchymal atrophy, progressed as compared to the head CT of 08/03/2016. Redemonstrated chronic cortically based infarct within the left frontal lobe in the left frontal operculum. Stable mild ill-defined hypoattenuation within the cerebral white matter which is nonspecific, but consistent with chronic small vessel ischemic disease. There is no acute intracranial hemorrhage. No acute demarcated cortical infarct. No extra-axial fluid collection. No evidence of intracranial mass. No midline shift. Vascular: No hyperdense vessel. Skull: Normal. Negative for fracture or focal lesion. Sinuses/Orbits: Visualized orbits show no acute finding. Mild mucosal thickening within the inferior right maxillary sinus. No significant mastoid effusion. IMPRESSION: No CT evidence of acute intracranial abnormality. 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 Stable mild cerebral white matter chronic small vessel ischemic disease. Mild  generalized parenchymal atrophy, progressed as compared to the prior CT of 08/03/2016. Mild right maxillary sinus mucosal thickening. Electronically Signed   By: Kellie Simmering DO   On: 05/24/2020 19:08   US Abdomen Complete  Result Date: 05/01/2020 CLINICAL DATA:  Thrombocytopenia, nephrolithiasis EXAM: ABDOMEN ULTRASOUND COMPLETE COMPARISON:  None. FINDINGS: Gallbladder: The gallbladder is partially decompressed, but is otherwise unremarkable. No intraluminal stones or sludge is identified. Common bile duct: Diameter: 5 mm in proximal diameter Liver: No focal lesion identified. Within normal limits in parenchymal echogenicity. Portal vein is patent on color Doppler imaging with normal direction of blood flow towards the liver. IVC: No abnormality visualized. Pancreas: Visualized portion unremarkable. Spleen: The spleen is mildly enlarged measuring 13.7 cm in greatest dimension. No intrasplenic lesions are seen. Right Kidney: Length: 10.9 cm. Cortical thickness is preserved and cortical echogenicity is normal. 9 mm and 6 mm nonobstructing calculi are seen within the lower pole of the right kidney. No hydronephrosis. No solid intrarenal mass identified. Left Kidney: Length: 9.3 cm. Echogenicity within normal limits. No mass or hydronephrosis visualized. Abdominal aorta: No aneurysm visualized. Other findings: Small bilateral pleural effusions are identified. IMPRESSION: 1. Small bilateral pleural effusions. 2. Mild splenomegaly. 3. Nonobstructing right nephrolithiasis. Electronically Signed   By: Fidela Salisbury MD   On: 05/01/2020 21:30   DG Chest Portable 1 View  Result Date: 05/02/2020 CLINICAL DATA:  Weakness EXAM: PORTABLE CHEST 1 VIEW COMPARISON:  09/06/2016 chest radiograph and prior. FINDINGS: Hypoinflated lungs. Streaky bibasilar opacities. No pneumothorax. Blunting of the bilateral costophrenic sulci may reflect scarring versus trace effusions. Cardiomediastinal silhouette within normal limits.  Multilevel spondylosis. IMPRESSION: Hypoinflated lungs.  Bibasilar linear opacities, likely atelectasis. Trace bilateral pleural effusions versus bibasilar scarring. Electronically Signed   By: Primitivo Gauze M.D.   On: 05/15/2020 14:03   ECHOCARDIOGRAM COMPLETE  Result Date: 05/01/2020    ECHOCARDIOGRAM REPORT   Patient Name:   REYLENE STAUDER Date of Exam: 05/01/2020 Medical Rec #:  [id number]     Height:       62.0 in Accession #:    [id number]    Weight:       173.9 lb Date of Birth:  1945-12-28     BSA:          1.802 m Patient Age:    30 years      BP:           145/66 mmHg Patient Gender: F             HR:           72 bpm. Exam Location:  Inpatient Procedure:  2D Echo, Cardiac Doppler and Color Doppler Indications:    Murmur  History:        Patient has no prior history of Echocardiogram examinations.                 Signs/Symptoms:Shortness of Breath; Risk Factors:Hypertension.  Sonographer:    Clayton Lefort RDCS (AE) Referring Phys: [id number] Abigail Butts  Sonographer Comments: Limited patient mobility due to patient's scoliosis. Patient sitting at 45 degrees in bed for echocardiogram. IMPRESSIONS  1. Left ventricular ejection fraction, by estimation, is 60%. The left ventricle has normal function. The left ventricle has no regional wall motion abnormalities. There is mild left ventricular hypertrophy. Left ventricular diastolic parameters were normal.  2. Right ventricular systolic function is normal. The right ventricular size is normal.  3. Left atrial size was mildly dilated.  4. The mitral valve is grossly normal. Mild mitral valve regurgitation. No evidence of mitral stenosis.  5. Tricuspid valve regurgitation is mild to moderate.  6. The aortic valve is grossly normal. Aortic valve regurgitation is mild. No aortic stenosis is present. FINDINGS  Left Ventricle: Left ventricular ejection fraction, by estimation, is 60%. The left ventricle has normal function. The left ventricle has no regional wall  motion abnormalities. The left ventricular internal cavity size was normal in size. There is mild left ventricular hypertrophy. Left ventricular diastolic parameters were normal. Right Ventricle: The right ventricular size is normal. No increase in right ventricular wall thickness. Right ventricular systolic function is normal. Left Atrium: Left atrial size was mildly dilated. Right Atrium: Right atrial size was normal in size. Pericardium: There is no evidence of pericardial effusion. Mitral Valve: The mitral valve is grossly normal. Normal mobility of the mitral valve leaflets. Mild mitral valve regurgitation. No evidence of mitral valve stenosis. MV peak gradient, 7.1 mmHg. The mean mitral valve gradient is 2.0 mmHg. Tricuspid Valve: The tricuspid valve is grossly normal. Tricuspid valve regurgitation is mild to moderate. No evidence of tricuspid stenosis. Aortic Valve: The aortic valve is grossly normal. Aortic valve regurgitation is mild. Aortic regurgitation PHT measures 354 msec. No aortic stenosis is present. Aortic valve mean gradient measures 13.0 mmHg. Aortic valve peak gradient measures 26.4 mmHg.  Aortic valve area, by VTI measures 1.58 cm. Pulmonic Valve: The pulmonic valve was grossly normal. Pulmonic valve regurgitation is trivial. No evidence of pulmonic stenosis. Aorta: The aortic root and ascending aorta are structurally normal, with no evidence of dilitation. Venous: The inferior vena cava was not well visualized. IAS/Shunts: The interatrial septum was not well visualized.  LEFT VENTRICLE PLAX 2D LVIDd:         4.60 cm  Diastology LVIDs:         3.10 cm  LV e' lateral:   8.16 cm/s LV PW:         1.00 cm  LV E/e' lateral: 16.2 LV IVS:        1.00 cm  LV e' medial:    8.05 cm/s LVOT diam:     1.80 cm  LV E/e' medial:  16.4 LV SV:         83 LV SV Index:   46 LVOT Area:     2.54 cm  RIGHT VENTRICLE RV Basal diam:  3.00 cm RV S prime:     13.50 cm/s TAPSE (M-mode): 3.2 cm LEFT ATRIUM              Index       RIGHT ATRIUM  Index LA diam:        4.00 cm 2.22 cm/m  RA Area:     17.20 cm LA Vol (A2C):   65.6 ml 36.41 ml/m RA Volume:   41.80 ml  23.20 ml/m LA Vol (A4C):   57.7 ml 32.03 ml/m LA Biplane Vol: 61.7 ml 34.25 ml/m  AORTIC VALVE AV Area (Vmax):    1.51 cm AV Area (Vmean):   1.53 cm AV Area (VTI):     1.58 cm AV Vmax:           257.00 cm/s AV Vmean:          168.000 cm/s AV VTI:            0.524 m AV Peak Grad:      26.4 mmHg AV Mean Grad:      13.0 mmHg LVOT Vmax:         153.00 cm/s LVOT Vmean:        101.000 cm/s LVOT VTI:          0.325 m LVOT/AV VTI ratio: 0.62 AI PHT:            354 msec  AORTA Ao Root diam: 2.80 cm Ao Asc diam:  2.80 cm MITRAL VALVE                TRICUSPID VALVE MV Area (PHT): 3.60 cm     TR Peak grad:   44.9 mmHg MV Peak grad:  7.1 mmHg     TR Vmax:        335.00 cm/s MV Mean grad:  2.0 mmHg MV Vmax:       1.33 m/s     SHUNTS MV Vmean:      62.9 cm/s    Systemic VTI:  0.32 m MV Decel Time: 211 msec     Systemic Diam: 1.80 cm MV E velocity: 132.00 cm/s MV A velocity: 91.50 cm/s MV E/A ratio:  1.44 Cherlynn Kaiser MD Electronically signed by Cherlynn Kaiser MD Signature Date/Time: 05/01/2020/2:42:52 PM    Final     Cardiac Studies   Echocardiogram 05/01/2020:  1. Left ventricular ejection fraction, by estimation, is 60%. The left  ventricle has normal function. The left ventricle has no regional wall  motion abnormalities. There is mild left ventricular hypertrophy. Left  ventricular diastolic parameters were  normal.  2. Right ventricular systolic function is normal. The right ventricular  size is normal.  3. Left atrial size was mildly dilated.  4. The mitral valve is grossly normal. Mild mitral valve regurgitation.  No evidence of mitral stenosis.  5. Tricuspid valve regurgitation is mild to moderate.  6. The aortic valve is grossly normal. Aortic valve regurgitation is  mild. No aortic stenosis is present.    Patient Profile     74  y.o. female with a history of hypertension who presents with syncope and found to have severe iron deficiency anemia  Assessment & Plan    Syncope: Due to severe iron deficiency anemia, with hemoglobin down to 2.7.  S/p 3 units PRBCs, hemoglobin improved to 7.2 today.   GI and hematology consulted.  Echocardiogram yesterday showed normal biventricular function, mild MR, mild to moderate TR, mild AI.  No further cardiac work-up recommended.  Bradycardia: noted sinus bradycardia to 40s, particularly when sleeps.  Appears asymptomatic.  Suspect undiagnosed OSA, will need outpatient sleep study  [place]ace will sign off.   Medication Recommendations:  None Other recommendations (labs, testing, etc):  None  Follow up as an outpatient:  Will schedule   For questions or updates, please contact [place] Please consult [email or web address] for contact info under        Signed, Donato Heinz, MD  05/02/2020, 10:33 AM

## 2020-05-02 NOTE — H&P (View-Only) (Signed)
Patient ID: Regina Richards, female   DOB: 1946-08-13, 74 y.o.   MRN: [id number]    Progress Note   Subjective  Day # 2 CC: weakness/sob, severe iron deficiency anemia   HGB 7.4 post transfusions IV Feraheme 9/4   Abd US-no gallstones, liver appears normal, spleen mildly enlarged 13.7 cm, right  kidney stones  Patient feeling much better today, less weakness in general, says she is feeling calm, no complaints of any abdominal discomfort nausea etc.  Feels she could tolerate a bowel prep.    Objective   Vital signs in last 24 hours: Temp:  [97.6 F (36.4 C)-98.3 F (36.8 C)] 97.7 F (36.5 C) (09/05 0742) Pulse Rate:  [57-65] 65 (09/05 0501) Resp:  [12-19] 18 (09/05 0742) BP: (128-148)/(57-75) 146/63 (09/05 0742) SpO2:  [91 %-99 %] 91 % (09/05 0742) Weight:  [80.1 kg] 80.1 kg (09/05 0501) Last BM Date: 05/04/2020 General:    Elderly white female in NAD Heart:  Regular rate and rhythm; soft murmur Lungs: Respirations even and unlabored, lungs CTA bilaterally Abdomen:  Soft, protuberant nontender and nondistended. Normal bowel sounds. Extremities:  Without edema. Neurologic:  Alert and oriented,  grossly normal neurologically. Psych:  Cooperative. Normal mood and affect.  Intake/Output from previous day: 09/04 0701 - 09/05 0700 In: 1338.1 [P.O.:220; I.V.:1001.1; IV Piggyback:117] Out: 1650 [Urine:1650] Intake/Output this shift: No intake/output data recorded.  Lab Results: Recent Labs    05/12/2020 1800 05/01/20 1010 05/02/20 0234  WBC 3.6* 5.7 5.5  HGB 2.7* 7.4* 7.2*  HCT 11.9* 25.3* 24.0*  PLT 80* 91* 84*   BMET Recent Labs    05/06/2020 1344 05/01/20 1010 05/02/20 0234  NA 141 143 144  K 3.5 3.7 3.5  CL 110 112* 113*  CO2 21* 22 25  GLUCOSE 125* 99 127*  BUN 19 16 16   CREATININE 1.22* 1.03* 0.97  CALCIUM 8.6* 8.6* 8.7*   LFT Recent Labs    05/02/20 0234  PROT 5.7*  ALBUMIN 2.9*  AST 38  ALT 30  ALKPHOS 78  BILITOT 2.1*   PT/INR Recent Labs     05/10/2020 1944  LABPROT 15.9*  INR 1.3*    Studies/Results: CT Head Wo Contrast  Result Date: 04/29/2020 CLINICAL DATA:  Provided history: Syncope, simple, normal neuro exam, seizure-like activity. EXAM: CT HEAD WITHOUT CONTRAST TECHNIQUE: Contiguous axial images were obtained from the base of the skull through the vertex without intravenous contrast. COMPARISON:  Head CT 10/05/2015. FINDINGS: Brain: Mildly motion degraded examination. Mild generalized parenchymal atrophy, progressed as compared to the head CT of 08/03/2016. Redemonstrated chronic cortically based infarct within the left frontal lobe in the left frontal operculum. Stable mild ill-defined hypoattenuation within the cerebral white matter which is nonspecific, but consistent with chronic small vessel ischemic disease. There is no acute intracranial hemorrhage. No acute demarcated cortical infarct. No extra-axial fluid collection. No evidence of intracranial mass. No midline shift. Vascular: No hyperdense vessel. Skull: Normal. Negative for fracture or focal lesion. Sinuses/Orbits: Visualized orbits show no acute finding. Mild mucosal thickening within the inferior right maxillary sinus. No significant mastoid effusion. IMPRESSION: No CT evidence of acute intracranial abnormality. 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 Stable mild cerebral white matter chronic small vessel ischemic disease. Mild generalized parenchymal atrophy, progressed as compared to the prior CT of 08/03/2016. Mild right maxillary sinus mucosal thickening. Electronically Signed   By: Kellie Simmering DO   On: 05/15/2020 19:08   US Abdomen Complete  Result Date: 05/01/2020  CLINICAL DATA:  Thrombocytopenia, nephrolithiasis EXAM: ABDOMEN ULTRASOUND COMPLETE COMPARISON:  None. FINDINGS: Gallbladder: The gallbladder is partially decompressed, but is otherwise unremarkable. No intraluminal stones or sludge is identified. Common bile duct: Diameter: 5 mm  in proximal diameter Liver: No focal lesion identified. Within normal limits in parenchymal echogenicity. Portal vein is patent on color Doppler imaging with normal direction of blood flow towards the liver. IVC: No abnormality visualized. Pancreas: Visualized portion unremarkable. Spleen: The spleen is mildly enlarged measuring 13.7 cm in greatest dimension. No intrasplenic lesions are seen. Right Kidney: Length: 10.9 cm. Cortical thickness is preserved and cortical echogenicity is normal. 9 mm and 6 mm nonobstructing calculi are seen within the lower pole of the right kidney. No hydronephrosis. No solid intrarenal mass identified. Left Kidney: Length: 9.3 cm. Echogenicity within normal limits. No mass or hydronephrosis visualized. Abdominal aorta: No aneurysm visualized. Other findings: Small bilateral pleural effusions are identified. IMPRESSION: 1. Small bilateral pleural effusions. 2. Mild splenomegaly. 3. Nonobstructing right nephrolithiasis. Electronically Signed   By: Fidela Salisbury MD   On: 05/01/2020 21:30   DG Chest Portable 1 View  Result Date: 05/26/2020 CLINICAL DATA:  Weakness EXAM: PORTABLE CHEST 1 VIEW COMPARISON:  09/06/2016 chest radiograph and prior. FINDINGS: Hypoinflated lungs. Streaky bibasilar opacities. No pneumothorax. Blunting of the bilateral costophrenic sulci may reflect scarring versus trace effusions. Cardiomediastinal silhouette within normal limits. Multilevel spondylosis. IMPRESSION: Hypoinflated lungs.  Bibasilar linear opacities, likely atelectasis. Trace bilateral pleural effusions versus bibasilar scarring. Electronically Signed   By: Primitivo Gauze M.D.   On: 05/16/2020 14:03   ECHOCARDIOGRAM COMPLETE  Result Date: 05/01/2020    ECHOCARDIOGRAM REPORT   Patient Name:   Regina Richards Date of Exam: 05/01/2020 Medical Rec #:  [id number]     Height:       62.0 in Accession #:    [id number]    Weight:       173.9 lb Date of Birth:  04-17-1946     BSA:          1.802 m  Patient Age:    89 years      BP:           145/66 mmHg Patient Gender: F             HR:           72 bpm. Exam Location:  Inpatient Procedure: 2D Echo, Cardiac Doppler and Color Doppler Indications:    Murmur  History:        Patient has no prior history of Echocardiogram examinations.                 Signs/Symptoms:Shortness of Breath; Risk Factors:Hypertension.  Sonographer:    Clayton Lefort RDCS (AE) Referring Phys: [id number] Abigail Butts  Sonographer Comments: Limited patient mobility due to patient's scoliosis. Patient sitting at 45 degrees in bed for echocardiogram. IMPRESSIONS  1. Left ventricular ejection fraction, by estimation, is 60%. The left ventricle has normal function. The left ventricle has no regional wall motion abnormalities. There is mild left ventricular hypertrophy. Left ventricular diastolic parameters were normal.  2. Right ventricular systolic function is normal. The right ventricular size is normal.  3. Left atrial size was mildly dilated.  4. The mitral valve is grossly normal. Mild mitral valve regurgitation. No evidence of mitral stenosis.  5. Tricuspid valve regurgitation is mild to moderate.  6. The aortic valve is grossly normal. Aortic valve regurgitation is mild. No aortic stenosis is present. FINDINGS  Left Ventricle: Left ventricular ejection fraction, by estimation, is 60%. The left ventricle has normal function. The left ventricle has no regional wall motion abnormalities. The left ventricular internal cavity size was normal in size. There is mild left ventricular hypertrophy. Left ventricular diastolic parameters were normal. Right Ventricle: The right ventricular size is normal. No increase in right ventricular wall thickness. Right ventricular systolic function is normal. Left Atrium: Left atrial size was mildly dilated. Right Atrium: Right atrial size was normal in size. Pericardium: There is no evidence of pericardial effusion. Mitral Valve: The mitral valve is grossly  normal. Normal mobility of the mitral valve leaflets. Mild mitral valve regurgitation. No evidence of mitral valve stenosis. MV peak gradient, 7.1 mmHg. The mean mitral valve gradient is 2.0 mmHg. Tricuspid Valve: The tricuspid valve is grossly normal. Tricuspid valve regurgitation is mild to moderate. No evidence of tricuspid stenosis. Aortic Valve: The aortic valve is grossly normal. Aortic valve regurgitation is mild. Aortic regurgitation PHT measures 354 msec. No aortic stenosis is present. Aortic valve mean gradient measures 13.0 mmHg. Aortic valve peak gradient measures 26.4 mmHg.  Aortic valve area, by VTI measures 1.58 cm. Pulmonic Valve: The pulmonic valve was grossly normal. Pulmonic valve regurgitation is trivial. No evidence of pulmonic stenosis. Aorta: The aortic root and ascending aorta are structurally normal, with no evidence of dilitation. Venous: The inferior vena cava was not well visualized. IAS/Shunts: The interatrial septum was not well visualized.  LEFT VENTRICLE PLAX 2D LVIDd:         4.60 cm  Diastology LVIDs:         3.10 cm  LV e' lateral:   8.16 cm/s LV PW:         1.00 cm  LV E/e' lateral: 16.2 LV IVS:        1.00 cm  LV e' medial:    8.05 cm/s LVOT diam:     1.80 cm  LV E/e' medial:  16.4 LV SV:         83 LV SV Index:   46 LVOT Area:     2.54 cm  RIGHT VENTRICLE RV Basal diam:  3.00 cm RV S prime:     13.50 cm/s TAPSE (M-mode): 3.2 cm LEFT ATRIUM             Index       RIGHT ATRIUM           Index LA diam:        4.00 cm 2.22 cm/m  RA Area:     17.20 cm LA Vol (A2C):   65.6 ml 36.41 ml/m RA Volume:   41.80 ml  23.20 ml/m LA Vol (A4C):   57.7 ml 32.03 ml/m LA Biplane Vol: 61.7 ml 34.25 ml/m  AORTIC VALVE AV Area (Vmax):    1.51 cm AV Area (Vmean):   1.53 cm AV Area (VTI):     1.58 cm AV Vmax:           257.00 cm/s AV Vmean:          168.000 cm/s AV VTI:            0.524 m AV Peak Grad:      26.4 mmHg AV Mean Grad:      13.0 mmHg LVOT Vmax:         153.00 cm/s LVOT Vmean:         101.000 cm/s LVOT VTI:          0.325 m  LVOT/AV VTI ratio: 0.62 AI PHT:            354 msec  AORTA Ao Root diam: 2.80 cm Ao Asc diam:  2.80 cm MITRAL VALVE                TRICUSPID VALVE MV Area (PHT): 3.60 cm     TR Peak grad:   44.9 mmHg MV Peak grad:  7.1 mmHg     TR Vmax:        335.00 cm/s MV Mean grad:  2.0 mmHg MV Vmax:       1.33 m/s     SHUNTS MV Vmean:      62.9 cm/s    Systemic VTI:  0.32 m MV Decel Time: 211 msec     Systemic Diam: 1.80 cm MV E velocity: 132.00 cm/s MV A velocity: 91.50 cm/s MV E/A ratio:  1.44 Cherlynn Kaiser MD Electronically signed by Cherlynn Kaiser MD Signature Date/Time: 05/01/2020/2:42:52 PM    Final       Assessment / Plan:    74 year old white female admitted with 1 month history of progressive weakness and shortness of breath, found to have a profound iron deficiency anemia, ferritin=5.  Also has thrombocytopenia.  Hemoglobin stable after transfusions Has received IV Feraheme, will need second dose next week. Hematology has consulted and recommendation is to proceed with GI evaluation first.  Hematology feels thrombocytopenia may be secondary to severe iron deficiency. No evidence of liver disease on ultrasound, she does have mild splenomegaly.  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  Plan: Clear liquid diet today, n.p.o. after midnight  IgA is normal and celiac antibodies are pending  Proceed with colonoscopy and EGD in am with Dr. Fuller Plan.  Procedures were discussed in detail with the patient including indications risks benefits and she is agreeable to proceed.   LOS: 2 days   Amy Esterwood, PA-C  05/02/2020, 9:36 AM      Attending Physician Note   I have taken an interval history, reviewed the chart and examined the patient. I agree with the Advanced Practitioner's note, impression and recommendations.   Lucio Edward, MD [facility]

## 2020-05-02 NOTE — Progress Notes (Addendum)
Patient ID: Regina Richards, female   DOB: 15-Sep-1945, 74 y.o.   MRN: [id number]    Progress Note   Subjective  Day # 2 CC: weakness/sob, severe iron deficiency anemia   HGB 7.4 post transfusions IV Feraheme 9/4   Abd US-no gallstones, liver appears normal, spleen mildly enlarged 13.7 cm, right  kidney stones  Patient feeling much better today, less weakness in general, says she is feeling calm, no complaints of any abdominal discomfort nausea etc.  Feels she could tolerate a bowel prep.    Objective   Vital signs in last 24 hours: Temp:  [97.6 F (36.4 C)-98.3 F (36.8 C)] 97.7 F (36.5 C) (09/05 0742) Pulse Rate:  [57-65] 65 (09/05 0501) Resp:  [12-19] 18 (09/05 0742) BP: (128-148)/(57-75) 146/63 (09/05 0742) SpO2:  [91 %-99 %] 91 % (09/05 0742) Weight:  [80.1 kg] 80.1 kg (09/05 0501) Last BM Date: 04/29/2020 General:    Elderly white female in NAD Heart:  Regular rate and rhythm; soft murmur Lungs: Respirations even and unlabored, lungs CTA bilaterally Abdomen:  Soft, protuberant nontender and nondistended. Normal bowel sounds. Extremities:  Without edema. Neurologic:  Alert and oriented,  grossly normal neurologically. Psych:  Cooperative. Normal mood and affect.  Intake/Output from previous day: 09/04 0701 - 09/05 0700 In: 1338.1 [P.O.:220; I.V.:1001.1; IV Piggyback:117] Out: 1650 [Urine:1650] Intake/Output this shift: No intake/output data recorded.  Lab Results: Recent Labs    04/28/2020 1800 05/01/20 1010 05/02/20 0234  WBC 3.6* 5.7 5.5  HGB 2.7* 7.4* 7.2*  HCT 11.9* 25.3* 24.0*  PLT 80* 91* 84*   BMET Recent Labs    05/02/2020 1344 05/01/20 1010 05/02/20 0234  NA 141 143 144  K 3.5 3.7 3.5  CL 110 112* 113*  CO2 21* 22 25  GLUCOSE 125* 99 127*  BUN 19 16 16   CREATININE 1.22* 1.03* 0.97  CALCIUM 8.6* 8.6* 8.7*   LFT Recent Labs    05/02/20 0234  PROT 5.7*  ALBUMIN 2.9*  AST 38  ALT 30  ALKPHOS 78  BILITOT 2.1*   PT/INR Recent Labs     04/29/2020 1944  LABPROT 15.9*  INR 1.3*    Studies/Results: CT Head Wo Contrast  Result Date: 05/24/2020 CLINICAL DATA:  Provided history: Syncope, simple, normal neuro exam, seizure-like activity. EXAM: CT HEAD WITHOUT CONTRAST TECHNIQUE: Contiguous axial images were obtained from the base of the skull through the vertex without intravenous contrast. COMPARISON:  Head CT 10/05/2015. FINDINGS: Brain: Mildly motion degraded examination. Mild generalized parenchymal atrophy, progressed as compared to the head CT of 08/03/2016. Redemonstrated chronic cortically based infarct within the left frontal lobe in the left frontal operculum. Stable mild ill-defined hypoattenuation within the cerebral white matter which is nonspecific, but consistent with chronic small vessel ischemic disease. There is no acute intracranial hemorrhage. No acute demarcated cortical infarct. No extra-axial fluid collection. No evidence of intracranial mass. No midline shift. Vascular: No hyperdense vessel. Skull: Normal. Negative for fracture or focal lesion. Sinuses/Orbits: Visualized orbits show no acute finding. Mild mucosal thickening within the inferior right maxillary sinus. No significant mastoid effusion. IMPRESSION: No CT evidence of acute intracranial abnormality. 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 Stable mild cerebral white matter chronic small vessel ischemic disease. Mild generalized parenchymal atrophy, progressed as compared to the prior CT of 08/03/2016. Mild right maxillary sinus mucosal thickening. Electronically Signed   By: Kellie Simmering DO   On: 05/13/2020 19:08   US Abdomen Complete  Result Date: 05/01/2020  CLINICAL DATA:  Thrombocytopenia, nephrolithiasis EXAM: ABDOMEN ULTRASOUND COMPLETE COMPARISON:  None. FINDINGS: Gallbladder: The gallbladder is partially decompressed, but is otherwise unremarkable. No intraluminal stones or sludge is identified. Common bile duct: Diameter: 5 mm  in proximal diameter Liver: No focal lesion identified. Within normal limits in parenchymal echogenicity. Portal vein is patent on color Doppler imaging with normal direction of blood flow towards the liver. IVC: No abnormality visualized. Pancreas: Visualized portion unremarkable. Spleen: The spleen is mildly enlarged measuring 13.7 cm in greatest dimension. No intrasplenic lesions are seen. Right Kidney: Length: 10.9 cm. Cortical thickness is preserved and cortical echogenicity is normal. 9 mm and 6 mm nonobstructing calculi are seen within the lower pole of the right kidney. No hydronephrosis. No solid intrarenal mass identified. Left Kidney: Length: 9.3 cm. Echogenicity within normal limits. No mass or hydronephrosis visualized. Abdominal aorta: No aneurysm visualized. Other findings: Small bilateral pleural effusions are identified. IMPRESSION: 1. Small bilateral pleural effusions. 2. Mild splenomegaly. 3. Nonobstructing right nephrolithiasis. Electronically Signed   By: Fidela Salisbury MD   On: 05/01/2020 21:30   DG Chest Portable 1 View  Result Date: 05/25/2020 CLINICAL DATA:  Weakness EXAM: PORTABLE CHEST 1 VIEW COMPARISON:  09/06/2016 chest radiograph and prior. FINDINGS: Hypoinflated lungs. Streaky bibasilar opacities. No pneumothorax. Blunting of the bilateral costophrenic sulci may reflect scarring versus trace effusions. Cardiomediastinal silhouette within normal limits. Multilevel spondylosis. IMPRESSION: Hypoinflated lungs.  Bibasilar linear opacities, likely atelectasis. Trace bilateral pleural effusions versus bibasilar scarring. Electronically Signed   By: Primitivo Gauze M.D.   On: 05/17/2020 14:03   ECHOCARDIOGRAM COMPLETE  Result Date: 05/01/2020    ECHOCARDIOGRAM REPORT   Patient Name:   Regina Richards Date of Exam: 05/01/2020 Medical Rec #:  [id number]     Height:       62.0 in Accession #:    [id number]    Weight:       173.9 lb Date of Birth:  06/27/1946     BSA:          1.802 m  Patient Age:    30 years      BP:           145/66 mmHg Patient Gender: F             HR:           72 bpm. Exam Location:  Inpatient Procedure: 2D Echo, Cardiac Doppler and Color Doppler Indications:    Murmur  History:        Patient has no prior history of Echocardiogram examinations.                 Signs/Symptoms:Shortness of Breath; Risk Factors:Hypertension.  Sonographer:    Clayton Lefort RDCS (AE) Referring Phys: [id number] Abigail Butts  Sonographer Comments: Limited patient mobility due to patient's scoliosis. Patient sitting at 45 degrees in bed for echocardiogram. IMPRESSIONS  1. Left ventricular ejection fraction, by estimation, is 60%. The left ventricle has normal function. The left ventricle has no regional wall motion abnormalities. There is mild left ventricular hypertrophy. Left ventricular diastolic parameters were normal.  2. Right ventricular systolic function is normal. The right ventricular size is normal.  3. Left atrial size was mildly dilated.  4. The mitral valve is grossly normal. Mild mitral valve regurgitation. No evidence of mitral stenosis.  5. Tricuspid valve regurgitation is mild to moderate.  6. The aortic valve is grossly normal. Aortic valve regurgitation is mild. No aortic stenosis is present. FINDINGS  Left Ventricle: Left ventricular ejection fraction, by estimation, is 60%. The left ventricle has normal function. The left ventricle has no regional wall motion abnormalities. The left ventricular internal cavity size was normal in size. There is mild left ventricular hypertrophy. Left ventricular diastolic parameters were normal. Right Ventricle: The right ventricular size is normal. No increase in right ventricular wall thickness. Right ventricular systolic function is normal. Left Atrium: Left atrial size was mildly dilated. Right Atrium: Right atrial size was normal in size. Pericardium: There is no evidence of pericardial effusion. Mitral Valve: The mitral valve is grossly  normal. Normal mobility of the mitral valve leaflets. Mild mitral valve regurgitation. No evidence of mitral valve stenosis. MV peak gradient, 7.1 mmHg. The mean mitral valve gradient is 2.0 mmHg. Tricuspid Valve: The tricuspid valve is grossly normal. Tricuspid valve regurgitation is mild to moderate. No evidence of tricuspid stenosis. Aortic Valve: The aortic valve is grossly normal. Aortic valve regurgitation is mild. Aortic regurgitation PHT measures 354 msec. No aortic stenosis is present. Aortic valve mean gradient measures 13.0 mmHg. Aortic valve peak gradient measures 26.4 mmHg.  Aortic valve area, by VTI measures 1.58 cm. Pulmonic Valve: The pulmonic valve was grossly normal. Pulmonic valve regurgitation is trivial. No evidence of pulmonic stenosis. Aorta: The aortic root and ascending aorta are structurally normal, with no evidence of dilitation. Venous: The inferior vena cava was not well visualized. IAS/Shunts: The interatrial septum was not well visualized.  LEFT VENTRICLE PLAX 2D LVIDd:         4.60 cm  Diastology LVIDs:         3.10 cm  LV e' lateral:   8.16 cm/s LV PW:         1.00 cm  LV E/e' lateral: 16.2 LV IVS:        1.00 cm  LV e' medial:    8.05 cm/s LVOT diam:     1.80 cm  LV E/e' medial:  16.4 LV SV:         83 LV SV Index:   46 LVOT Area:     2.54 cm  RIGHT VENTRICLE RV Basal diam:  3.00 cm RV S prime:     13.50 cm/s TAPSE (M-mode): 3.2 cm LEFT ATRIUM             Index       RIGHT ATRIUM           Index LA diam:        4.00 cm 2.22 cm/m  RA Area:     17.20 cm LA Vol (A2C):   65.6 ml 36.41 ml/m RA Volume:   41.80 ml  23.20 ml/m LA Vol (A4C):   57.7 ml 32.03 ml/m LA Biplane Vol: 61.7 ml 34.25 ml/m  AORTIC VALVE AV Area (Vmax):    1.51 cm AV Area (Vmean):   1.53 cm AV Area (VTI):     1.58 cm AV Vmax:           257.00 cm/s AV Vmean:          168.000 cm/s AV VTI:            0.524 m AV Peak Grad:      26.4 mmHg AV Mean Grad:      13.0 mmHg LVOT Vmax:         153.00 cm/s LVOT Vmean:         101.000 cm/s LVOT VTI:          0.325 m  LVOT/AV VTI ratio: 0.62 AI PHT:            354 msec  AORTA Ao Root diam: 2.80 cm Ao Asc diam:  2.80 cm MITRAL VALVE                TRICUSPID VALVE MV Area (PHT): 3.60 cm     TR Peak grad:   44.9 mmHg MV Peak grad:  7.1 mmHg     TR Vmax:        335.00 cm/s MV Mean grad:  2.0 mmHg MV Vmax:       1.33 m/s     SHUNTS MV Vmean:      62.9 cm/s    Systemic VTI:  0.32 m MV Decel Time: 211 msec     Systemic Diam: 1.80 cm MV E velocity: 132.00 cm/s MV A velocity: 91.50 cm/s MV E/A ratio:  1.44 Cherlynn Kaiser MD Electronically signed by Cherlynn Kaiser MD Signature Date/Time: 05/01/2020/2:42:52 PM    Final       Assessment / Plan:    74 year old white female admitted with 1 month history of progressive weakness and shortness of breath, found to have a profound iron deficiency anemia, ferritin=5.  Also has thrombocytopenia.  Hemoglobin stable after transfusions Has received IV Feraheme, will need second dose next week. Hematology has consulted and recommendation is to proceed with GI evaluation first.  Hematology feels thrombocytopenia may be secondary to severe iron deficiency. No evidence of liver disease on ultrasound, she does have mild splenomegaly.  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  Plan: Clear liquid diet today, n.p.o. after midnight  IgA is normal and celiac antibodies are pending  Proceed with colonoscopy and EGD in am with Dr. Fuller Plan.  Procedures were discussed in detail with the patient including indications risks benefits and she is agreeable to proceed.   LOS: 2 days   Amy Esterwood, PA-C  05/02/2020, 9:36 AM      Attending Physician Note   I have taken an interval history, reviewed the chart and examined the patient. I agree with the Advanced Practitioner's note, impression and recommendations.   Lucio Edward, MD [facility]

## 2020-05-02 NOTE — Anesthesia Preprocedure Evaluation (Addendum)
Anesthesia Evaluation  Patient identified by MRN, date of birth, ID band Patient awake    Reviewed: Allergy & Precautions, NPO status   History of Anesthesia Complications (+) PONV  Airway Mallampati: II  TM Distance: >3 FB     Dental   Pulmonary pneumonia,    breath sounds clear to auscultation       Cardiovascular hypertension, + dysrhythmias  Rhythm:Regular Rate:Normal     Neuro/Psych Anxiety    GI/Hepatic negative GI ROS, Neg liver ROS,   Endo/Other    Renal/GU Renal disease     Musculoskeletal  (+) Arthritis ,   Abdominal   Peds  Hematology  (+) anemia ,   Anesthesia Other Findings   Reproductive/Obstetrics                            Anesthesia Physical Anesthesia Plan  ASA: III  Anesthesia Plan: MAC   Post-op Pain Management:    Induction: Intravenous  PONV Risk Score and Plan: Ondansetron, Dexamethasone and Midazolam  Airway Management Planned: Nasal Cannula and Simple Face Mask  Additional Equipment:   Intra-op Plan:   Post-operative Plan: Possible Post-op intubation/ventilation  Informed Consent:     Dental advisory given  Plan Discussed with: CRNA and Anesthesiologist  Anesthesia Plan Comments:        Anesthesia Quick Evaluation

## 2020-05-02 NOTE — Progress Notes (Signed)
Pt's H&H was 7.2/24 in am. Dr. Darrick Meigs notified this and he told that no blood transfusion at this time, but ask to GI team regarding blood transfusion. Talked Dr. Fuller Plan regarding this and hemoglobin above 7 is okay, no blood transfusion necessary. Patient understood that going to be MN NPO due to EGD & colonoscopy tomorrow. HS [employer]

## 2020-05-02 NOTE — Progress Notes (Signed)
[facility]  PROGRESS NOTE  JOANNAH GITLIN AOZ:[id number] DOB: July 06, 1946 DOA: 04/29/2020 PCP: Patient, No Pcp Per   Brief HPI:   74 year old female with medical history of anxiety, hypertension, kidney stone presents to ED for evaluation of exertional shortness of breath for past 3 to 4 weeks.  Patient said that she has been having shortness of breath with walking for past 3 to 4 weeks, also had leg and arm pain on standing.  She was seen by urologist 2 days ago at that time urine was positive for bacteria and she was started on antibiotics for UTI but did not start taking it. In the ED lab work showed hemoglobin of 2.9, MCV 75. Lab work also showed severe iron deficiency with 2% saturation, iron 10, TIBC 514 Patient denies any history of GI bleed.  FOBT was negative in the ED.   Subjective   Patient seen and examined, s/p 3 units PRBC.  Hemoglobin is 7.2 this morning.   Assessment/Plan:     1. Severe iron deficiency anemia-patient presented with hemoglobin of 2.7, unclear etiology.  Improved to 7.2 after 3 units PRBC.  FOBT is negative.  Discussed with hematology, recommended GI consult for EGD and colonoscopy.   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  Patient was given 1 dose of IV iron Feraheme 510 mg.  Gastroenterology has been consulted and plan to do EGD and colonoscopy.    Patient also might need bone marrow biopsy at some point if EGD and colonoscopy are unrevealing.  Follow celiac antibodies. 2. Thrombocytopenia-platelet count 84,000/microliter.  Unclear etiology, she did have low platelets in 2017, thought to be due to sepsis.  It had resolved with last platelet count of 227,000 on 09/06/2016.  Hematology is following.     COVID-19 Labs  Recent Labs    05/16/2020 1344 05/09/2020 1944  DDIMER 0.70*  --   FERRITIN  --  5*  LDH  --  127    Lab Results  Component Value Date   SARSCOV2NAA NEGATIVE 05/13/2020     Scheduled  medications:    CBG: Recent Labs  Lab 05/16/2020 1333  GLUCAP 100*    SpO2: 91 % O2 Flow Rate (L/min): 2 L/min    CBC: Recent Labs  Lab 05/23/2020 1344 05/25/2020 1800 05/01/20 1010 05/02/20 0234  WBC 3.9* 3.6* 5.7 5.5  NEUTROABS 3.3 3.0 4.4  --   HGB 2.9* 2.7* 7.4* 7.2*  HCT 12.3* 11.9* 25.3* 24.0*  MCV 75.0* 73.0* 81.6 83.0  PLT 98* 80* 91* 84*    Basic Metabolic Panel: Recent Labs  Lab 05/24/2020 1344 05/04/2020 1944 05/01/20 1010 05/02/20 0234  NA 141  --  143 144  K 3.5  --  3.7 3.5  CL 110  --  112* 113*  CO2 21*  --  22 25  GLUCOSE 125*  --  99 127*  BUN 19  --  16 16  CREATININE 1.22*  --  1.03* 0.97  CALCIUM 8.6*  --  8.6* 8.7*  MG  --  2.0  --   --   PHOS  --  3.8  --   --     Antibiotics: Anti-infectives (From admission, onward)   None       DVT prophylaxis: SCDs  Code Status: Full code  Family Communication: No family at bedside    Status is: Inpatient  Dispo: The patient is from: Home  Anticipated d/c is to: Home              Anticipated d/c date is: 05/04/2020              Patient currently not medically stable for discharge  Barrier to discharge-severe iron deficiency, GI and hematology consulted     Consultants:  Hematology  Gastroenterology  Procedures:     Objective   Vitals:   05/01/20 2215 05/01/20 2300 05/02/20 0501 05/02/20 0742  BP: 129/61 (!) 128/57 136/67 (!) 146/63  Pulse:  (!) 57 65   Resp: _0 Temp: 98.2 F (36.8 C) 98.3 F (36.8 C) 97.6 F (36.4 C) 97.7 F (36.5 C)  TempSrc: Oral Oral Oral Oral  SpO2: 98% 99% 99% 91%  Weight:   80.1 kg   Height:        Intake/Output Summary (Last 24 hours) at 05/02/2020 0949 Last data filed at 05/02/2020 0700 Gross per 24 hour  Intake 337 ml  Output 1650 ml  Net -1313 ml    09/03 1901 - 09/05 0700 In: 3291.1 [P.O.:220; I.V.:2001.1] Out: 2200 [Urine:2200]  Filed Weights   05/11/2020 2351 05/01/20 0445 05/02/20 0501  Weight: 78.9 kg 78.9 kg  80.1 kg    Physical Examination:    General-appears in no acute distress  Heart-S1-S2, regular, no murmur auscultated  Lungs-clear to auscultation bilaterally, no wheezing or crackles auscultated  Abdomen-soft, nontender, no organomegaly  Extremities-no edema in the lower extremities  Neuro-alert, oriented x3, no focal deficit noted    Data Reviewed:   Recent Results (from the past 240 hour(s))  SARS Coronavirus 2 by RT PCR (hospital order, performed in [place] hospital lab) Nasopharyngeal Nasopharyngeal Swab     Status: None   Collection Time: 05/13/2020  4:44 PM   Specimen: Nasopharyngeal Swab  Result Value Ref Range Status   SARS Coronavirus 2 NEGATIVE NEGATIVE Final    Comment: (NOTE) SARS-CoV-2 target nucleic acids are NOT DETECTED.  The SARS-CoV-2 RNA is generally detectable in upper and lower respiratory specimens during the acute phase of infection. The lowest concentration of SARS-CoV-2 viral copies this assay can detect is 250 copies / mL. A negative result does not preclude SARS-CoV-2 infection and should not be used as the sole basis for treatment or other patient management decisions.  A negative result may occur with improper specimen collection / handling, submission of specimen other than nasopharyngeal swab, presence of viral mutation(s) within the areas targeted by this assay, and inadequate number of viral copies (<250 copies / mL). A negative result must be combined with clinical observations, patient history, and epidemiological information.  Fact Sheet for Patients:   StrictlyIdeas.no  Fact Sheet for Healthcare Providers: BankingDealers.co.za  This test is not yet approved or  cleared by the [place] FDA and has been authorized for detection and/or diagnosis of SARS-CoV-2 by FDA under an Emergency Use Authorization (EUA).  This EUA will remain in effect (meaning this test can be used) for  the duration of the COVID-19 declaration under Section 564(b)(1) of the Act, 21 U.S.C. section 360bbb-3(b)(1), unless the authorization is terminated or revoked sooner.  Performed at [facility], [place] [address] [place], [place] [address]   MRSA PCR Screening     Status: None   Collection Time: 05/01/20 12:01 AM   Specimen: Nasopharyngeal  Result Value Ref Range Status   MRSA by PCR NEGATIVE NEGATIVE Final    Comment:  The GeneXpert MRSA Assay (FDA approved for NASAL specimens only), is one component of a comprehensive MRSA colonization surveillance program. It is not intended to diagnose MRSA infection nor to guide or monitor treatment for MRSA infections. Performed at [facility], [place] [address], [place], [place] [address]     No results for input(s): LIPASE, AMYLASE in the last 168 hours. No results for input(s): AMMONIA in the last 168 hours.  Cardiac Enzymes: Recent Labs  Lab 05/16/2020 1944  CKTOTAL 64   BNP (last 3 results) Recent Labs    05/15/2020 1555  BNP 235.4*     Studies:  CT Head Wo Contrast  Result Date: 05/25/2020 CLINICAL DATA:  Provided history: Syncope, simple, normal neuro exam, seizure-like activity. EXAM: CT HEAD WITHOUT CONTRAST TECHNIQUE: Contiguous axial images were obtained from the base of the skull through the vertex without intravenous contrast. COMPARISON:  Head CT 10/05/2015. FINDINGS: Brain: Mildly motion degraded examination. Mild generalized parenchymal atrophy, progressed as compared to the head CT of 08/03/2016. Redemonstrated chronic cortically based infarct within the left frontal lobe in the left frontal operculum. Stable mild ill-defined hypoattenuation within the cerebral white matter which is nonspecific, but consistent with chronic small vessel ischemic disease. There is no acute intracranial hemorrhage. No acute demarcated cortical infarct. No extra-axial fluid collection. No evidence of intracranial mass. No  midline shift. Vascular: No hyperdense vessel. Skull: Normal. Negative for fracture or focal lesion. Sinuses/Orbits: Visualized orbits show no acute finding. Mild mucosal thickening within the inferior right maxillary sinus. No significant mastoid effusion. IMPRESSION: No CT evidence of acute intracranial abnormality. 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 Stable mild cerebral white matter chronic small vessel ischemic disease. Mild generalized parenchymal atrophy, progressed as compared to the prior CT of 08/03/2016. Mild right maxillary sinus mucosal thickening. Electronically Signed   By: Kellie Simmering DO   On: 05/24/2020 19:08   US Abdomen Complete  Result Date: 05/01/2020 CLINICAL DATA:  Thrombocytopenia, nephrolithiasis EXAM: ABDOMEN ULTRASOUND COMPLETE COMPARISON:  None. FINDINGS: Gallbladder: The gallbladder is partially decompressed, but is otherwise unremarkable. No intraluminal stones or sludge is identified. Common bile duct: Diameter: 5 mm in proximal diameter Liver: No focal lesion identified. Within normal limits in parenchymal echogenicity. Portal vein is patent on color Doppler imaging with normal direction of blood flow towards the liver. IVC: No abnormality visualized. Pancreas: Visualized portion unremarkable. Spleen: The spleen is mildly enlarged measuring 13.7 cm in greatest dimension. No intrasplenic lesions are seen. Right Kidney: Length: 10.9 cm. Cortical thickness is preserved and cortical echogenicity is normal. 9 mm and 6 mm nonobstructing calculi are seen within the lower pole of the right kidney. No hydronephrosis. No solid intrarenal mass identified. Left Kidney: Length: 9.3 cm. Echogenicity within normal limits. No mass or hydronephrosis visualized. Abdominal aorta: No aneurysm visualized. Other findings: Small bilateral pleural effusions are identified. IMPRESSION: 1. Small bilateral pleural effusions. 2. Mild splenomegaly. 3. Nonobstructing right  nephrolithiasis. Electronically Signed   By: Fidela Salisbury MD   On: 05/01/2020 21:30   DG Chest Portable 1 View  Result Date: 05/22/2020 CLINICAL DATA:  Weakness EXAM: PORTABLE CHEST 1 VIEW COMPARISON:  09/06/2016 chest radiograph and prior. FINDINGS: Hypoinflated lungs. Streaky bibasilar opacities. No pneumothorax. Blunting of the bilateral costophrenic sulci may reflect scarring versus trace effusions. Cardiomediastinal silhouette within normal limits. Multilevel spondylosis. IMPRESSION: Hypoinflated lungs.  Bibasilar linear opacities, likely atelectasis. Trace bilateral pleural effusions versus bibasilar scarring. Electronically Signed   By: Milus Mallick.D.  On: 05/24/2020 14:03   ECHOCARDIOGRAM COMPLETE  Result Date: 05/01/2020    ECHOCARDIOGRAM REPORT   Patient Name:   Regina Richards Date of Exam: 05/01/2020 Medical Rec #:  [id number]     Height:       62.0 in Accession #:    [id number]    Weight:       173.9 lb Date of Birth:  Oct 22, 1945     BSA:          1.802 m Patient Age:    69 years      BP:           145/66 mmHg Patient Gender: F             HR:           72 bpm. Exam Location:  Inpatient Procedure: 2D Echo, Cardiac Doppler and Color Doppler Indications:    Murmur  History:        Patient has no prior history of Echocardiogram examinations.                 Signs/Symptoms:Shortness of Breath; Risk Factors:Hypertension.  Sonographer:    Clayton Lefort RDCS (AE) Referring Phys: [id number] Abigail Butts  Sonographer Comments: Limited patient mobility due to patient's scoliosis. Patient sitting at 45 degrees in bed for echocardiogram. IMPRESSIONS  1. Left ventricular ejection fraction, by estimation, is 60%. The left ventricle has normal function. The left ventricle has no regional wall motion abnormalities. There is mild left ventricular hypertrophy. Left ventricular diastolic parameters were normal.  2. Right ventricular systolic function is normal. The right ventricular size is normal.  3. Left  atrial size was mildly dilated.  4. The mitral valve is grossly normal. Mild mitral valve regurgitation. No evidence of mitral stenosis.  5. Tricuspid valve regurgitation is mild to moderate.  6. The aortic valve is grossly normal. Aortic valve regurgitation is mild. No aortic stenosis is present. FINDINGS  Left Ventricle: Left ventricular ejection fraction, by estimation, is 60%. The left ventricle has normal function. The left ventricle has no regional wall motion abnormalities. The left ventricular internal cavity size was normal in size. There is mild left ventricular hypertrophy. Left ventricular diastolic parameters were normal. Right Ventricle: The right ventricular size is normal. No increase in right ventricular wall thickness. Right ventricular systolic function is normal. Left Atrium: Left atrial size was mildly dilated. Right Atrium: Right atrial size was normal in size. Pericardium: There is no evidence of pericardial effusion. Mitral Valve: The mitral valve is grossly normal. Normal mobility of the mitral valve leaflets. Mild mitral valve regurgitation. No evidence of mitral valve stenosis. MV peak gradient, 7.1 mmHg. The mean mitral valve gradient is 2.0 mmHg. Tricuspid Valve: The tricuspid valve is grossly normal. Tricuspid valve regurgitation is mild to moderate. No evidence of tricuspid stenosis. Aortic Valve: The aortic valve is grossly normal. Aortic valve regurgitation is mild. Aortic regurgitation PHT measures 354 msec. No aortic stenosis is present. Aortic valve mean gradient measures 13.0 mmHg. Aortic valve peak gradient measures 26.4 mmHg.  Aortic valve area, by VTI measures 1.58 cm. Pulmonic Valve: The pulmonic valve was grossly normal. Pulmonic valve regurgitation is trivial. No evidence of pulmonic stenosis. Aorta: The aortic root and ascending aorta are structurally normal, with no evidence of dilitation. Venous: The inferior vena cava was not well visualized. IAS/Shunts: The  interatrial septum was not well visualized.  LEFT VENTRICLE PLAX 2D LVIDd:         4.60 cm  Diastology LVIDs:         3.10 cm  LV e' lateral:   8.16 cm/s LV PW:         1.00 cm  LV E/e' lateral: 16.2 LV IVS:        1.00 cm  LV e' medial:    8.05 cm/s LVOT diam:     1.80 cm  LV E/e' medial:  16.4 LV SV:         83 LV SV Index:   46 LVOT Area:     2.54 cm  RIGHT VENTRICLE RV Basal diam:  3.00 cm RV S prime:     13.50 cm/s TAPSE (M-mode): 3.2 cm LEFT ATRIUM             Index       RIGHT ATRIUM           Index LA diam:        4.00 cm 2.22 cm/m  RA Area:     17.20 cm LA Vol (A2C):   65.6 ml 36.41 ml/m RA Volume:   41.80 ml  23.20 ml/m LA Vol (A4C):   57.7 ml 32.03 ml/m LA Biplane Vol: 61.7 ml 34.25 ml/m  AORTIC VALVE AV Area (Vmax):    1.51 cm AV Area (Vmean):   1.53 cm AV Area (VTI):     1.58 cm AV Vmax:           257.00 cm/s AV Vmean:          168.000 cm/s AV VTI:            0.524 m AV Peak Grad:      26.4 mmHg AV Mean Grad:      13.0 mmHg LVOT Vmax:         153.00 cm/s LVOT Vmean:        101.000 cm/s LVOT VTI:          0.325 m LVOT/AV VTI ratio: 0.62 AI PHT:            354 msec  AORTA Ao Root diam: 2.80 cm Ao Asc diam:  2.80 cm MITRAL VALVE                TRICUSPID VALVE MV Area (PHT): 3.60 cm     TR Peak grad:   44.9 mmHg MV Peak grad:  7.1 mmHg     TR Vmax:        335.00 cm/s MV Mean grad:  2.0 mmHg MV Vmax:       1.33 m/s     SHUNTS MV Vmean:      62.9 cm/s    Systemic VTI:  0.32 m MV Decel Time: 211 msec     Systemic Diam: 1.80 cm MV E velocity: 132.00 cm/s MV A velocity: 91.50 cm/s MV E/A ratio:  1.44 Cherlynn Kaiser MD Electronically signed by Cherlynn Kaiser MD Signature Date/Time: 05/01/2020/2:42:52 PM    Final     Oswald Hillock   [facility] If 7PM-7AM, please contact night-coverage at [email or web address], Office  [phone]   05/02/2020, 9:49 AM  LOS: 2 days

## 2020-05-03 ENCOUNTER — Encounter (HOSPITAL_COMMUNITY): Admission: EM | Disposition: E | Payer: Self-pay | Source: Ambulatory Visit | Attending: Internal Medicine

## 2020-05-03 ENCOUNTER — Inpatient Hospital Stay (HOSPITAL_COMMUNITY): Payer: Medicare Other

## 2020-05-03 ENCOUNTER — Inpatient Hospital Stay (HOSPITAL_COMMUNITY): Payer: Medicare Other | Admitting: Anesthesiology

## 2020-05-03 ENCOUNTER — Encounter (HOSPITAL_COMMUNITY): Payer: Self-pay | Admitting: Internal Medicine

## 2020-05-03 DIAGNOSIS — C189 Malignant neoplasm of colon, unspecified: Secondary | ICD-10-CM | POA: Diagnosis present

## 2020-05-03 DIAGNOSIS — K7581 Nonalcoholic steatohepatitis (NASH): Secondary | ICD-10-CM | POA: Diagnosis present

## 2020-05-03 DIAGNOSIS — K5669 Other partial intestinal obstruction: Secondary | ICD-10-CM

## 2020-05-03 DIAGNOSIS — F32A Depression, unspecified: Secondary | ICD-10-CM | POA: Diagnosis present

## 2020-05-03 DIAGNOSIS — C187 Malignant neoplasm of sigmoid colon: Principal | ICD-10-CM

## 2020-05-03 DIAGNOSIS — K7460 Unspecified cirrhosis of liver: Secondary | ICD-10-CM | POA: Diagnosis present

## 2020-05-03 DIAGNOSIS — K635 Polyp of colon: Secondary | ICD-10-CM

## 2020-05-03 DIAGNOSIS — E669 Obesity, unspecified: Secondary | ICD-10-CM | POA: Diagnosis present

## 2020-05-03 DIAGNOSIS — D124 Benign neoplasm of descending colon: Secondary | ICD-10-CM | POA: Diagnosis present

## 2020-05-03 HISTORY — PX: POLYPECTOMY: SHX5525

## 2020-05-03 HISTORY — PX: HEMOSTASIS CLIP PLACEMENT: SHX6857

## 2020-05-03 HISTORY — PX: BIOPSY: SHX5522

## 2020-05-03 HISTORY — PX: SUBMUCOSAL TATTOO INJECTION: SHX6856

## 2020-05-03 HISTORY — PX: COLONOSCOPY WITH PROPOFOL: SHX5780

## 2020-05-03 LAB — CBC
HCT: 26.2 % — ABNORMAL LOW (ref 36.0–46.0)
Hemoglobin: 7.7 g/dL — ABNORMAL LOW (ref 12.0–15.0)
MCH: 24.8 pg — ABNORMAL LOW (ref 26.0–34.0)
MCHC: 29.4 g/dL — ABNORMAL LOW (ref 30.0–36.0)
MCV: 84.2 fL (ref 80.0–100.0)
Platelets: 86 10*3/uL — ABNORMAL LOW (ref 150–400)
RBC: 3.11 MIL/uL — ABNORMAL LOW (ref 3.87–5.11)
RDW: 20.6 % — ABNORMAL HIGH (ref 11.5–15.5)
WBC: 6 10*3/uL (ref 4.0–10.5)
nRBC: 0.3 % — ABNORMAL HIGH (ref 0.0–0.2)

## 2020-05-03 LAB — BASIC METABOLIC PANEL
Anion gap: 8 (ref 5–15)
BUN: 7 mg/dL — ABNORMAL LOW (ref 8–23)
CO2: 24 mmol/L (ref 22–32)
Calcium: 8.5 mg/dL — ABNORMAL LOW (ref 8.9–10.3)
Chloride: 111 mmol/L (ref 98–111)
Creatinine, Ser: 0.79 mg/dL (ref 0.44–1.00)
GFR calc Af Amer: >60 mL/min (ref >60)
GFR calc non Af Amer: >60 mL/min (ref >60)
Glucose, Bld: 89 mg/dL (ref 70–99)
Potassium: 3.4 mmol/L — ABNORMAL LOW (ref 3.5–5.1)
Sodium: 143 mmol/L (ref 135–145)

## 2020-05-03 SURGERY — COLONOSCOPY WITH PROPOFOL
Anesthesia: Monitor Anesthesia Care

## 2020-05-03 MED ORDER — LACTATED RINGERS IV SOLN: INTRAVENOUS | Status: DC

## 2020-05-03 MED ORDER — IOHEXOL 300 MG/ML  SOLN
100.0000 mL | Freq: Once | INTRAMUSCULAR | Status: AC | PRN
  Administered 2020-05-03: 100 mL via INTRAVENOUS

## 2020-05-03 MED ORDER — PHENYLEPHRINE HCL (PRESSORS) 10 MG/ML IV SOLN
INTRAVENOUS | Status: DC | PRN
  Administered 2020-05-03: 80 ug via INTRAVENOUS
  Administered 2020-05-03: 120 ug via INTRAVENOUS
  Administered 2020-05-03: 80 ug via INTRAVENOUS
  Administered 2020-05-03: 120 ug via INTRAVENOUS

## 2020-05-03 MED ORDER — EPHEDRINE SULFATE 50 MG/ML IJ SOLN
INTRAMUSCULAR | Status: DC | PRN
  Administered 2020-05-03 (×2): 5 mg via INTRAVENOUS

## 2020-05-03 MED ORDER — PROPOFOL 500 MG/50ML IV EMUL
INTRAVENOUS | Status: DC | PRN
  Administered 2020-05-03: 120 ug/kg/min via INTRAVENOUS

## 2020-05-03 MED ORDER — SPOT INK MARKER SYRINGE KIT
PACK | SUBMUCOSAL | Status: DC | PRN
  Administered 2020-05-03: 2 mL via SUBMUCOSAL

## 2020-05-03 MED ORDER — LIDOCAINE HCL (CARDIAC) PF 100 MG/5ML IV SOSY
PREFILLED_SYRINGE | INTRAVENOUS | Status: DC | PRN
  Administered 2020-05-03: 40 mg via INTRAVENOUS

## 2020-05-03 MED ORDER — POTASSIUM CHLORIDE CRYS ER 20 MEQ PO TBCR
20.0000 meq | EXTENDED_RELEASE_TABLET | Freq: Once | ORAL | Status: AC
  Administered 2020-05-03: 20 meq via ORAL
  Filled 2020-05-03: qty 1

## 2020-05-03 MED ORDER — IOHEXOL 9 MG/ML PO SOLN
500.0000 mL | ORAL | Status: AC
  Administered 2020-05-03 (×2): 500 mL via ORAL

## 2020-05-03 MED ORDER — SPOT INK MARKER SYRINGE KIT
PACK | SUBMUCOSAL | Status: AC
  Filled 2020-05-03: qty 5

## 2020-05-03 MED ORDER — PROPOFOL 10 MG/ML IV BOLUS
INTRAVENOUS | Status: DC | PRN
  Administered 2020-05-03: 30 mg via INTRAVENOUS

## 2020-05-03 SURGICAL SUPPLY — 25 items

## 2020-05-03 NOTE — Progress Notes (Signed)
[facility]  PROGRESS NOTE  Regina Richards FMB:[id number] DOB: 06-12-1946 DOA: 05/16/2020 PCP: Patient, No Pcp Per   Brief HPI:   74 year old female with medical history of anxiety, hypertension, kidney stone presents to ED for evaluation of exertional shortness of breath for past 3 to 4 weeks.  Patient said that she has been having shortness of breath with walking for past 3 to 4 weeks, also had leg and arm pain on standing.  She was seen by urologist 2 days ago at that time urine was positive for bacteria and she was started on antibiotics for UTI but did not start taking it. In the ED lab work showed hemoglobin of 2.9, MCV 75. Lab work also showed severe iron deficiency with 2% saturation, iron 10, TIBC 514 Patient denies any history of GI bleed.  FOBT was negative in the ED.   Subjective   Patient seen and examined, s/p colonoscopy which showed partially obstructing sigmoid colon mass.  Denies any pain.   Assessment/Plan:     1. Sigmoid colon mass -patient presented with severe iron deficiency anemia-p with hemoglobin of 2.7.  Improved to 7.2 after 3 units PRBC.  FOBT is negative.  Discussed with hematology, recommended GI consult for EGD and colonoscopy.   Patient underwent colonoscopy today which showed partially obstructing sigmoid colon mass.  Mass was biopsied.  CT abdomen pelvis has been ordered.  We will also consult general surgery for sigmoid colectomy.   2. Anemia-patient hemoglobin improved with 3 units PRBC, today hemoglobin is 7.4.  Patient was given 1 dose of IV iron Feraheme 510 mg.  Gastroenterology was consulted, patient underwent colonoscopy as above. 3. Thrombocytopenia-platelet count 84,000/microliter.  Unclear etiology, she did have low platelets in 2017, thought to be due to sepsis.  It had resolved with last platelet count of 227,000 on 09/06/2016.  Hematology is following. 4. Hypokalemia-potassium 3.4, replace potassium and follow BMP in am.     COVID-19  Labs  Recent Labs    05/25/2020 1344 05/01/2020 1944  DDIMER 0.70*  --   FERRITIN  --  5*  LDH  --  127    Lab Results  Component Value Date   SARSCOV2NAA NEGATIVE 05/18/2020     Scheduled medications:    CBG: Recent Labs  Lab 05/25/2020 1333  GLUCAP 100*    SpO2: 95 % O2 Flow Rate (L/min): 2 L/min    CBC: Recent Labs  Lab 05/20/2020 1344 05/14/2020 1800 05/01/20 1010 05/02/20 0234 05/12/2020 0209  WBC 3.9* 3.6* 5.7 5.5 6.0  NEUTROABS 3.3 3.0 4.4  --   --   HGB 2.9* 2.7* 7.4* 7.2* 7.7*  HCT 12.3* 11.9* 25.3* 24.0* 26.2*  MCV 75.0* 73.0* 81.6 83.0 84.2  PLT 98* 80* 91* 84* 86*    Basic Metabolic Panel: Recent Labs  Lab 04/29/2020 1344 05/26/2020 1944 05/01/20 1010 05/02/20 0234 05/26/2020 0209  NA 141  --  143 144 143  K 3.5  --  3.7 3.5 3.4*  CL 110  --  112* 113* 111  CO2 21*  --  22 25 24   GLUCOSE 125*  --  99 127* 89  BUN 19  --  16 16 7*  CREATININE 1.22*  --  1.03* 0.97 0.79  CALCIUM 8.6*  --  8.6* 8.7* 8.5*  MG  --  2.0  --   --   --   PHOS  --  3.8  --   --   --     Antibiotics:  Anti-infectives (From admission, onward)   None       DVT prophylaxis: SCDs  Code Status: Full code  Family Communication: No family at bedside    Status is: Inpatient  Dispo: The patient is from: Home              Anticipated d/c is to: Home              Anticipated d/c date is: 05/07/2020              Patient currently not medically stable for discharge  Barrier to discharge-severe iron deficiency, GI and hematology consulted     Consultants:  Hematology  Gastroenterology  Procedures:     Objective   Vitals:   04/29/2020 0240 05/26/2020 0700 05/10/2020 0911 05/06/2020 0923  BP: (!) 123/57 (!) 167/69 (!) 113/59 116/68  Pulse: 60 (!) 59 78 77  Resp: 17 18 18 15   Temp: 98.6 F (37 C) (!) 96.7 F (35.9 C) (!) 96.8 F (36 C)   TempSrc: Oral Oral Axillary   SpO2: 97% 100% 96% 95%  Weight: 78.8 kg 77.1 kg    Height:  5\' 4"  (1.626 m)       Intake/Output Summary (Last 24 hours) at 05/05/2020 1240 Last data filed at 05/07/2020 1000 Gross per 24 hour  Intake 1020 ml  Output 900 ml  Net 120 ml    09/04 1901 - 09/06 0700 In: 590 [P.O.:590] Out: 1750 [Urine:1750]  Filed Weights   05/02/20 0501 04/29/2020 0240 04/28/2020 0700  Weight: 80.1 kg 78.8 kg 77.1 kg    Physical Examination:    General-appears in no acute distress  Heart-S1-S2, regular, no murmur auscultated  Lungs-clear to auscultation bilaterally, no wheezing or crackles auscultated  Abdomen-soft, nontender, no organomegaly  Extremities-no edema in the lower extremities  Neuro-alert, oriented x3, no focal deficit noted    Data Reviewed:   Recent Results (from the past 240 hour(s))  SARS Coronavirus 2 by RT PCR (hospital order, performed in [place] hospital lab) Nasopharyngeal Nasopharyngeal Swab     Status: None   Collection Time: 05/26/2020  4:44 PM   Specimen: Nasopharyngeal Swab  Result Value Ref Range Status   SARS Coronavirus 2 NEGATIVE NEGATIVE Final    Comment: (NOTE) SARS-CoV-2 target nucleic acids are NOT DETECTED.  The SARS-CoV-2 RNA is generally detectable in upper and lower respiratory specimens during the acute phase of infection. The lowest concentration of SARS-CoV-2 viral copies this assay can detect is 250 copies / mL. A negative result does not preclude SARS-CoV-2 infection and should not be used as the sole basis for treatment or other patient management decisions.  A negative result may occur with improper specimen collection / handling, submission of specimen other than nasopharyngeal swab, presence of viral mutation(s) within the areas targeted by this assay, and inadequate number of viral copies (<250 copies / mL). A negative result must be combined with clinical observations, patient history, and epidemiological information.  Fact Sheet for Patients:   StrictlyIdeas.no  Fact Sheet for  Healthcare Providers: BankingDealers.co.za  This test is not yet approved or  cleared by the [place] FDA and has been authorized for detection and/or diagnosis of SARS-CoV-2 by FDA under an Emergency Use Authorization (EUA).  This EUA will remain in effect (meaning this test can be used) for the duration of the COVID-19 declaration under Section 564(b)(1) of the Act, 21 U.S.C. section 360bbb-3(b)(1), unless the authorization is terminated or revoked sooner.  Performed at  [facility], [place] [address], [place], [place] [address]   MRSA PCR Screening     Status: None   Collection Time: 05/01/20 12:01 AM   Specimen: Nasopharyngeal  Result Value Ref Range Status   MRSA by PCR NEGATIVE NEGATIVE Final    Comment:        The GeneXpert MRSA Assay (FDA approved for NASAL specimens only), is one component of a comprehensive MRSA colonization surveillance program. It is not intended to diagnose MRSA infection nor to guide or monitor treatment for MRSA infections. Performed at [facility], [place] [address] [place], [place] [address]   Culture, Urine     Status: None (Preliminary result)   Collection Time: 05/02/20 10:15 AM   Specimen: Urine, Random  Result Value Ref Range Status   Specimen Description URINE, RANDOM  Final   Special Requests NONE  Final   Culture   Final    CULTURE REINCUBATED FOR BETTER GROWTH Performed at [facility], [place] [address] [place], [place] [address]    Report Status PENDING  Incomplete    No results for input(s): LIPASE, AMYLASE in the last 168 hours. No results for input(s): AMMONIA in the last 168 hours.  Cardiac Enzymes: Recent Labs  Lab 05/04/2020 1944  CKTOTAL 64   BNP (last 3 results) Recent Labs    05/26/2020 1555  BNP 235.4*     Studies:  US Abdomen Complete  Result Date: 05/01/2020 CLINICAL DATA:  Thrombocytopenia, nephrolithiasis EXAM: ABDOMEN ULTRASOUND COMPLETE COMPARISON:  None.  FINDINGS: Gallbladder: The gallbladder is partially decompressed, but is otherwise unremarkable. No intraluminal stones or sludge is identified. Common bile duct: Diameter: 5 mm in proximal diameter Liver: No focal lesion identified. Within normal limits in parenchymal echogenicity. Portal vein is patent on color Doppler imaging with normal direction of blood flow towards the liver. IVC: No abnormality visualized. Pancreas: Visualized portion unremarkable. Spleen: The spleen is mildly enlarged measuring 13.7 cm in greatest dimension. No intrasplenic lesions are seen. Right Kidney: Length: 10.9 cm. Cortical thickness is preserved and cortical echogenicity is normal. 9 mm and 6 mm nonobstructing calculi are seen within the lower pole of the right kidney. No hydronephrosis. No solid intrarenal mass identified. Left Kidney: Length: 9.3 cm. Echogenicity within normal limits. No mass or hydronephrosis visualized. Abdominal aorta: No aneurysm visualized. Other findings: Small bilateral pleural effusions are identified. IMPRESSION: 1. Small bilateral pleural effusions. 2. Mild splenomegaly. 3. Nonobstructing right nephrolithiasis. Electronically Signed   By: Fidela Salisbury MD   On: 05/01/2020 21:30   ECHOCARDIOGRAM COMPLETE  Result Date: 05/01/2020    ECHOCARDIOGRAM REPORT   Patient Name:   Regina Richards Date of Exam: 05/01/2020 Medical Rec #:  [id number]     Height:       62.0 in Accession #:    [id number]    Weight:       173.9 lb Date of Birth:  1945-08-29     BSA:          1.802 m Patient Age:    81 years      BP:           145/66 mmHg Patient Gender: F             HR:           72 bpm. Exam Location:  Inpatient Procedure: 2D Echo, Cardiac Doppler and Color Doppler Indications:    Murmur  History:  Patient has no prior history of Echocardiogram examinations.                 Signs/Symptoms:Shortness of Breath; Risk Factors:Hypertension.  Sonographer:    Clayton Lefort RDCS (AE) Referring Phys: [id number] Abigail Butts   Sonographer Comments: Limited patient mobility due to patient's scoliosis. Patient sitting at 45 degrees in bed for echocardiogram. IMPRESSIONS  1. Left ventricular ejection fraction, by estimation, is 60%. The left ventricle has normal function. The left ventricle has no regional wall motion abnormalities. There is mild left ventricular hypertrophy. Left ventricular diastolic parameters were normal.  2. Right ventricular systolic function is normal. The right ventricular size is normal.  3. Left atrial size was mildly dilated.  4. The mitral valve is grossly normal. Mild mitral valve regurgitation. No evidence of mitral stenosis.  5. Tricuspid valve regurgitation is mild to moderate.  6. The aortic valve is grossly normal. Aortic valve regurgitation is mild. No aortic stenosis is present. FINDINGS  Left Ventricle: Left ventricular ejection fraction, by estimation, is 60%. The left ventricle has normal function. The left ventricle has no regional wall motion abnormalities. The left ventricular internal cavity size was normal in size. There is mild left ventricular hypertrophy. Left ventricular diastolic parameters were normal. Right Ventricle: The right ventricular size is normal. No increase in right ventricular wall thickness. Right ventricular systolic function is normal. Left Atrium: Left atrial size was mildly dilated. Right Atrium: Right atrial size was normal in size. Pericardium: There is no evidence of pericardial effusion. Mitral Valve: The mitral valve is grossly normal. Normal mobility of the mitral valve leaflets. Mild mitral valve regurgitation. No evidence of mitral valve stenosis. MV peak gradient, 7.1 mmHg. The mean mitral valve gradient is 2.0 mmHg. Tricuspid Valve: The tricuspid valve is grossly normal. Tricuspid valve regurgitation is mild to moderate. No evidence of tricuspid stenosis. Aortic Valve: The aortic valve is grossly normal. Aortic valve regurgitation is mild. Aortic regurgitation PHT  measures 354 msec. No aortic stenosis is present. Aortic valve mean gradient measures 13.0 mmHg. Aortic valve peak gradient measures 26.4 mmHg.  Aortic valve area, by VTI measures 1.58 cm. Pulmonic Valve: The pulmonic valve was grossly normal. Pulmonic valve regurgitation is trivial. No evidence of pulmonic stenosis. Aorta: The aortic root and ascending aorta are structurally normal, with no evidence of dilitation. Venous: The inferior vena cava was not well visualized. IAS/Shunts: The interatrial septum was not well visualized.  LEFT VENTRICLE PLAX 2D LVIDd:         4.60 cm  Diastology LVIDs:         3.10 cm  LV e' lateral:   8.16 cm/s LV PW:         1.00 cm  LV E/e' lateral: 16.2 LV IVS:        1.00 cm  LV e' medial:    8.05 cm/s LVOT diam:     1.80 cm  LV E/e' medial:  16.4 LV SV:         83 LV SV Index:   46 LVOT Area:     2.54 cm  RIGHT VENTRICLE RV Basal diam:  3.00 cm RV S prime:     13.50 cm/s TAPSE (M-mode): 3.2 cm LEFT ATRIUM             Index       RIGHT ATRIUM           Index LA diam:        4.00 cm 2.22  cm/m  RA Area:     17.20 cm LA Vol (A2C):   65.6 ml 36.41 ml/m RA Volume:   41.80 ml  23.20 ml/m LA Vol (A4C):   57.7 ml 32.03 ml/m LA Biplane Vol: 61.7 ml 34.25 ml/m  AORTIC VALVE AV Area (Vmax):    1.51 cm AV Area (Vmean):   1.53 cm AV Area (VTI):     1.58 cm AV Vmax:           257.00 cm/s AV Vmean:          168.000 cm/s AV VTI:            0.524 m AV Peak Grad:      26.4 mmHg AV Mean Grad:      13.0 mmHg LVOT Vmax:         153.00 cm/s LVOT Vmean:        101.000 cm/s LVOT VTI:          0.325 m LVOT/AV VTI ratio: 0.62 AI PHT:            354 msec  AORTA Ao Root diam: 2.80 cm Ao Asc diam:  2.80 cm MITRAL VALVE                TRICUSPID VALVE MV Area (PHT): 3.60 cm     TR Peak grad:   44.9 mmHg MV Peak grad:  7.1 mmHg     TR Vmax:        335.00 cm/s MV Mean grad:  2.0 mmHg MV Vmax:       1.33 m/s     SHUNTS MV Vmean:      62.9 cm/s    Systemic VTI:  0.32 m MV Decel Time: 211 msec     Systemic  Diam: 1.80 cm MV E velocity: 132.00 cm/s MV A velocity: 91.50 cm/s MV E/A ratio:  1.44 Cherlynn Kaiser MD Electronically signed by Cherlynn Kaiser MD Signature Date/Time: 05/01/2020/2:42:52 PM    Final     Oswald Hillock   [facility] If 7PM-7AM, please contact night-coverage at [email or web address], Office  [phone]   05/04/2020, 12:40 PM  LOS: 3 days

## 2020-05-03 NOTE — Op Note (Addendum)
[facility] Patient Name: Regina Richards Procedure Date : 05/21/2020 MRN: [id number] Attending MD: Ladene Artist , MD Date of Birth: 12/26/1945 CSN: [id number] Age: 74 Admit Type: Inpatient Procedure:                Colonoscopy Indications:              Unexplained iron deficiency anemia Providers:                Pricilla Riffle. Fuller Plan, MD, Josie Dixon, RN, Laverda Sorenson, Technician, Tawni Carnes, CRNA Referring MD:             [facility] Medicines:                Monitored Anesthesia Care Complications:            No immediate complications. Estimated blood loss:                            None. Estimated Blood Loss:     Estimated blood loss: none. Procedure:                Pre-Anesthesia Assessment:                           - Prior to the procedure, a History and Physical                            was performed, and patient medications and                            allergies were reviewed. The patient's tolerance of                            previous anesthesia was also reviewed. The risks                            and benefits of the procedure and the sedation                            options and risks were discussed with the patient.                            All questions were answered, and informed consent                            was obtained. Prior Anticoagulants: The patient has                            taken no previous anticoagulant or antiplatelet                            agents. ASA Grade Assessment: II - A patient with  mild systemic disease. After reviewing the risks                            and benefits, the patient was deemed in                            satisfactory condition to undergo the procedure.                           After obtaining informed consent, the colonoscope                            was passed under direct vision. Throughout the                            procedure, the  patient's blood pressure, pulse, and                            oxygen saturations were monitored continuously. The                            CF-HQ190L ([id number] Olympus colonoscope was                            introduced through the anus and advanced to the the                            cecum, identified by appendiceal orifice and                            ileocecal valve. The ileocecal valve, appendiceal                            orifice, and rectum were photographed. The quality                            of the bowel preparation was adequate after                            extensive lavage and suction. The colonoscopy was                            performed without difficulty. The patient tolerated                            the procedure well. Scope In: 8:26:38 AM Scope Out: 9:03:12 AM Scope Withdrawal Time: 0 hours 29 minutes 12 seconds  Total Procedure Duration: 0 hours 36 minutes 34 seconds  Findings:      The perianal and digital rectal examinations were normal.      A 20 mm polyp was found in the cecum. The polyp was sessile. The polyp       was removed with a piecemeal technique using a hot snare. Resection and       retrieval were complete.  Three sessile polyps were found in the distal rectum, descending colon       and transverse colon. The polyps were 8 to 10 mm in size. These polyps       were removed with a cold snare. Resection and retrieval were complete.       The rectal polypectomy site had persistent oozing. For hemostasis, two       hemostatic clips were successfully placed (MR conditional). There was no       bleeding at the end of the procedure.      A frond-like/villous, fungating and infiltrative partially obstructing       medium-sized mass was found at 35 cm in the sigmoid colon. The mass was       partially circumferential (involving two-thirds of the lumen       circumference). The mass measured 5 cm in length. In addition, its       diameter  measured 4 cm. No bleeding was present. This was biopsied with       a cold forceps for histology. Two areas just proximal to the mass were       tattooed with an injection of 2 mL of Spot (carbon black).      Internal hemorrhoids were found during retroflexion. The hemorrhoids       were small and Grade I (internal hemorrhoids that do not prolapse).      The exam was otherwise without abnormality on direct and retroflexion       views. Impression:               - One 20 mm polyp in the cecum, removed piecemeal                            using a hot snare. Resected and retrieved.                           - Three 8 to 10 mm polyps in the distal rectum, in                            the descending colon and in the transverse colon,                            removed with a cold snare. Resected and retrieved.                            Clips (MR conditional) were placed for hemostasis                            at the distal rectal polypectomy site.                           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 Biopsied. Tattooed.                           - Internal hemorrhoids.                           -  The examination was otherwise normal on direct                            and retroflexion views. Recommendation:           - Repeat colonoscopy in 1 year for surveillance.                           - Patient has a contact number available for                            emergencies. The signs and symptoms of potential                            delayed complications were discussed with the                            patient. Return to normal activities tomorrow.                            Written discharge instructions were provided to the                            patient.                           - Clear liquid diet for now to maintain bowel prep                            in case segmental colectomy can be scheduled soon.                            - Continue present medications.                           - Await pathology results.                           - No aspirin, ibuprofen, naproxen, or other                            non-steroidal anti-inflammatory drugs for 2 weeks                            after polyp removal.                           - Schedule CT AP.                           - Surgical consult for consideration of segmental                            sigmoid colectomy. Procedure Code(s):        --- Professional ---  45385, Colonoscopy, flexible; with removal of                            tumor(s), polyp(s), or other lesion(s) by snare                            technique                           45381, Colonoscopy, flexible; with directed                            submucosal injection(s), any substance                           15176, 47, Colonoscopy, flexible; with biopsy,                            single or multiple Diagnosis Code(s):        --- Professional ---                           K63.5, Polyp of colon                           K62.1, Rectal polyp                           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 All rights reserved. The codes documented in this report are preliminary and upon coder review may  be revised to meet current compliance requirements. Ladene Artist, MD 05/06/2020 9:17:09 AM This report has been signed electronically. Number of Addenda: 0

## 2020-05-03 NOTE — Anesthesia Postprocedure Evaluation (Signed)
Anesthesia Post Note  Patient: Regina Richards  Procedure(s) Performed: COLONOSCOPY WITH PROPOFOL (N/A ) BIOPSY SUBMUCOSAL TATTOO INJECTION POLYPECTOMY HEMOSTASIS CLIP PLACEMENT     Patient location during evaluation: Endoscopy Anesthesia Type: MAC Level of consciousness: awake Pain management: pain level controlled Vital Signs Assessment: post-procedure vital signs reviewed and stable Respiratory status: spontaneous breathing Cardiovascular status: stable Postop Assessment: no apparent nausea or vomiting Anesthetic complications: no   No complications documented.  Last Vitals:  Vitals:   05/25/2020 0911 05/08/2020 0923  BP: (!) 113/59 116/68  Pulse: 78 77  Resp: 18 15  Temp: (!) 36 C   SpO2: 96% 95%    Last Pain:  Vitals:   05/02/2020 0923  TempSrc:   PainSc: 0-No pain                 Jameya Pontiff

## 2020-05-03 NOTE — Progress Notes (Signed)
Brief Oncology Progress Note  HPI: Neri Vieyra Kohan 74 y.o. female with medical history significant for anxiety, HTN, and arthritis who presented with fatigue and was found to have pancytopenia with a Hgb of 2.7. She was found to have iron deficiency.  Interval History: --colonoscopy this AM reveals a sigmoid mass. Biopsy pending --CT abdomen shows no evidence of metastatic disease or lymphadenopathy --Hgb at 7.7, up from 7.2 yesterday. S/p IV iron and 3 units PRBC since admission --surgical service consulted, awaiting recommendations. --findings and plan discussed with patient.   O:  Vitals:   04/29/2020 1556 05/07/2020 2003  BP: 120/61 (!) 142/65  Pulse: 64 62  Resp: 16 (!) 23  Temp: 98.5 F (36.9 C) 98.1 F (36.7 C)  SpO2: 93% 98%   CBC Latest Ref Rng & Units 05/20/2020 05/02/2020 05/01/2020  WBC 4.0 - 10.5 K/uL 6.0 5.5 5.7  Hemoglobin 12.0 - 15.0 g/dL 7.7(L) 7.2(L) 7.4(L)  Hematocrit 36 - 46 % 26.2(L) 24.0(L) 25.3(L)  Platelets 150 - 400 K/uL 86(L) 84(L) 91(L)   CMP Latest Ref Rng & Units 05/22/2020 05/02/2020 05/01/2020  Glucose 70 - 99 mg/dL 89 127(H) 99  BUN 8 - 23 mg/dL 7(L) 16 16  Creatinine 0.44 - 1.00 mg/dL 0.79 0.97 1.03(H)  Sodium 135 - 145 mmol/L 143 144 143  Potassium 3.5 - 5.1 mmol/L 3.4(L) 3.5 3.7  Chloride 98 - 111 mmol/L 111 113(H) 112(H)  CO2 22 - 32 mmol/L 24 25 22   Calcium 8.9 - 10.3 mg/dL 8.5(L) 8.7(L) 8.6(L)  Total Protein 6.5 - 8.1 g/dL - 5.7(L) 6.2(L)  Total Bilirubin 0.3 - 1.2 mg/dL - 2.1(H) 3.1(H)  Alkaline Phos 38 - 126 U/L - 78 79  AST 15 - 41 U/L - 38 38  ALT 0 - 44 U/L - 30 29    A/P:  #Pancytopenia # Severe Iron Deficiency Anemia # Mild Thrombocytopenia #Colonic Mass --findings at this time are most consistent with a severe iron deficiency anemia. Source is likely the colonic mass/numerous polyps. Awaiting results of biopsy.  --appreciate assistance of GI. Agree with consult to surgery.  --please set transfusion goal of Hgb >7.0 and Plt >10 -- 510mg   of IV feraheme was ordered on 05/01/2020. Next dose can be administered in 7 days time.   --thrombocytopenia possibly due to consumption vs liver dysfunction. Cirrhotic morphology noted on CT scan  --Hem/Onc will continue to follow. Please notify our team when d/c is planned so we can set up outpatient f/u.   Ledell Peoples, MD Department of Hematology/Oncology [place] [facility] Phone: [phone] Pager: [phone] Email: [email or web address]

## 2020-05-03 NOTE — Transfer of Care (Signed)
Immediate Anesthesia Transfer of Care Note  Patient: Regina Richards  Procedure(s) Performed: COLONOSCOPY WITH PROPOFOL (N/A ) BIOPSY SUBMUCOSAL TATTOO INJECTION POLYPECTOMY HEMOSTASIS CLIP PLACEMENT  Patient Location: PACU  Anesthesia Type:MAC  Level of Consciousness: awake, alert , oriented and patient cooperative  Airway & Oxygen Therapy: Patient Spontanous Breathing and Patient connected to nasal cannula oxygen  Post-op Assessment: Report given to RN and Post -op Vital signs reviewed and stable  Post vital signs: Reviewed and stable  Last Vitals:  Vitals Value Taken Time  BP    Temp    Pulse    Resp    SpO2      Last Pain:  Vitals:   05/26/2020 0700  TempSrc: Oral  PainSc: 0-No pain         Complications: No complications documented.

## 2020-05-03 NOTE — Interval H&P Note (Signed)
History and Physical Interval Note:  05/02/2020 8:12 AM  Regina Richards  has presented today for surgery, with the diagnosis of Iron Deficiency Anemia.  The various methods of treatment have been discussed with the patient and family. After consideration of risks, benefits and other options for treatment, the patient has consented to  Procedure(s): ESOPHAGOGASTRODUODENOSCOPY (EGD) WITH PROPOFOL (N/A) COLONOSCOPY WITH PROPOFOL (N/A) as a surgical intervention.  The patient's history has been reviewed, patient examined, no change in status, stable for surgery.  I have reviewed the patient's chart and labs.  Questions were answered to the patient's satisfaction.     Pricilla Riffle. Fuller Plan

## 2020-05-04 ENCOUNTER — Encounter (HOSPITAL_COMMUNITY): Payer: Self-pay | Admitting: Gastroenterology

## 2020-05-04 DIAGNOSIS — D500 Iron deficiency anemia secondary to blood loss (chronic): Secondary | ICD-10-CM

## 2020-05-04 DIAGNOSIS — R932 Abnormal findings on diagnostic imaging of liver and biliary tract: Secondary | ICD-10-CM

## 2020-05-04 DIAGNOSIS — D696 Thrombocytopenia, unspecified: Secondary | ICD-10-CM

## 2020-05-04 DIAGNOSIS — K6389 Other specified diseases of intestine: Secondary | ICD-10-CM

## 2020-05-04 DIAGNOSIS — E876 Hypokalemia: Secondary | ICD-10-CM

## 2020-05-04 LAB — COMPREHENSIVE METABOLIC PANEL
ALT: 31 U/L (ref 0–44)
AST: 36 U/L (ref 15–41)
Albumin: 3 g/dL — ABNORMAL LOW (ref 3.5–5.0)
Alkaline Phosphatase: 92 U/L (ref 38–126)
Anion gap: 9 (ref 5–15)
BUN: <5 mg/dL — ABNORMAL LOW (ref 8–23)
CO2: 26 mmol/L (ref 22–32)
Calcium: 8.8 mg/dL — ABNORMAL LOW (ref 8.9–10.3)
Chloride: 106 mmol/L (ref 98–111)
Creatinine, Ser: 0.81 mg/dL (ref 0.44–1.00)
GFR calc Af Amer: >60 mL/min (ref >60)
GFR calc non Af Amer: >60 mL/min (ref >60)
Glucose, Bld: 92 mg/dL (ref 70–99)
Potassium: 3.6 mmol/L (ref 3.5–5.1)
Sodium: 141 mmol/L (ref 135–145)
Total Bilirubin: 1.4 mg/dL — ABNORMAL HIGH (ref 0.3–1.2)
Total Protein: 5.8 g/dL — ABNORMAL LOW (ref 6.5–8.1)

## 2020-05-04 LAB — AMMONIA: Ammonia: 36 umol/L — ABNORMAL HIGH (ref 9–35)

## 2020-05-04 LAB — URINE CULTURE

## 2020-05-04 LAB — BASIC METABOLIC PANEL
Anion gap: 8 (ref 5–15)
BUN: <5 mg/dL — ABNORMAL LOW (ref 8–23)
CO2: 27 mmol/L (ref 22–32)
Calcium: 8.7 mg/dL — ABNORMAL LOW (ref 8.9–10.3)
Chloride: 105 mmol/L (ref 98–111)
Creatinine, Ser: 0.75 mg/dL (ref 0.44–1.00)
GFR calc Af Amer: >60 mL/min (ref >60)
GFR calc non Af Amer: >60 mL/min (ref >60)
Glucose, Bld: 83 mg/dL (ref 70–99)
Potassium: 3.2 mmol/L — ABNORMAL LOW (ref 3.5–5.1)
Sodium: 140 mmol/L (ref 135–145)

## 2020-05-04 LAB — CBC
HCT: 24.7 % — ABNORMAL LOW (ref 36.0–46.0)
Hemoglobin: 7.1 g/dL — ABNORMAL LOW (ref 12.0–15.0)
MCH: 24 pg — ABNORMAL LOW (ref 26.0–34.0)
MCHC: 28.7 g/dL — ABNORMAL LOW (ref 30.0–36.0)
MCV: 83.4 fL (ref 80.0–100.0)
Platelets: 91 10*3/uL — ABNORMAL LOW (ref 150–400)
RBC: 2.96 MIL/uL — ABNORMAL LOW (ref 3.87–5.11)
RDW: 21.5 % — ABNORMAL HIGH (ref 11.5–15.5)
WBC: 6.7 10*3/uL (ref 4.0–10.5)
nRBC: 0.9 % — ABNORMAL HIGH (ref 0.0–0.2)

## 2020-05-04 LAB — PREPARE RBC (CROSSMATCH)

## 2020-05-04 LAB — HEPATITIS B CORE ANTIBODY, TOTAL: Hep B Core Total Ab: NONREACTIVE

## 2020-05-04 LAB — HEPATITIS A ANTIBODY, TOTAL: hep A Total Ab: NONREACTIVE

## 2020-05-04 LAB — HEPATITIS B SURFACE ANTIGEN: Hepatitis B Surface Ag: NONREACTIVE

## 2020-05-04 LAB — BILIRUBIN, DIRECT: Bilirubin, Direct: 0.4 mg/dL — ABNORMAL HIGH (ref 0.0–0.2)

## 2020-05-04 MED ORDER — POTASSIUM CHLORIDE 10 MEQ/100ML IV SOLN
10.0000 meq | INTRAVENOUS | Status: AC
  Administered 2020-05-04 (×2): 10 meq via INTRAVENOUS
  Filled 2020-05-04 (×2): qty 100

## 2020-05-04 MED ORDER — POLYETHYLENE GLYCOL 3350 17 G PO PACK
17.0000 g | PACK | Freq: Every day | ORAL | Status: DC
  Administered 2020-05-05 – 2020-05-11 (×3): 17 g via ORAL
  Filled 2020-05-04 (×8): qty 1

## 2020-05-04 MED ORDER — POTASSIUM CHLORIDE 10 MEQ/100ML IV SOLN: 10.0000 meq | INTRAVENOUS | Status: DC

## 2020-05-04 MED ORDER — SODIUM CHLORIDE 0.9% IV SOLUTION: Freq: Once | INTRAVENOUS | Status: AC

## 2020-05-04 NOTE — Plan of Care (Signed)
  Problem: Education: °Goal: Knowledge of General Education information will improve °Description: Including pain rating scale, medication(s)/side effects and non-pharmacologic comfort measures °Outcome: Progressing °  °Problem: Health Behavior/Discharge Planning: °Goal: Ability to manage health-related needs will improve °Outcome: Progressing °  °Problem: Clinical Measurements: °Goal: Will remain free from infection °Outcome: Progressing °Goal: Diagnostic test results will improve °Outcome: Progressing °Goal: Cardiovascular complication will be avoided °Outcome: Progressing °  °Problem: Activity: °Goal: Risk for activity intolerance will decrease °Outcome: Progressing °  °Problem: Nutrition: °Goal: Adequate nutrition will be maintained °Outcome: Progressing °  °

## 2020-05-04 NOTE — Progress Notes (Signed)
[facility]  PROGRESS NOTE  Regina Richards JQG:[id number] DOB: 06/28/1946 DOA: 04/29/2020 PCP: Patient, No Pcp Per   Brief HPI:   74 year old female with medical history of anxiety, hypertension, kidney stone presents to ED for evaluation of exertional shortness of breath for past 3 to 4 weeks.  Patient said that she has been having shortness of breath with walking for past 3 to 4 weeks, also had leg and arm pain on standing.  She was seen by urologist 2 days ago at that time urine was positive for bacteria and she was started on antibiotics for UTI but did not start taking it. In the ED lab work showed hemoglobin of 2.9, MCV 75. Lab work also showed severe iron deficiency with 2% saturation, iron 10, TIBC 514 Patient denies any history of GI bleed.  FOBT was negative in the ED.   Subjective   Patient seen and examined, denies any complaints.   Assessment/Plan:     1. Sigmoid colon mass -patient presented with severe iron deficiency anemia-p with hemoglobin of 2.7.  Improved to 7.2 after 3 units PRBC.  FOBT is negative.  Discussed with hematology, recommended GI consult for EGD and colonoscopy.   Patient underwent colonoscopy today which showed partially obstructing sigmoid colon mass.  Mass was biopsied.  CT abdomen pelvis has been ordered.  I have consulted general surgery for sigmoid mass resection/sigmoidectomy.  Patient to follow-up with oncology, Dr. Lorenso Courier as outpatient after discharge. 2. Anemia-patient hemoglobin improved with 3 units PRBC, today hemoglobin is 7.1.  Patient was given 1 dose of IV iron Feraheme 510 mg on 05/01/2020, she will need repeat dose of Feraheme 510 mg on 05/08/2020.  Gastroenterology was consulted, patient underwent colonoscopy as above. 3. Thrombocytopenia-platelet count 84,000/microliter.  Unclear etiology, she did have low platelets in 2017, thought to be due to sepsis.  It had resolved with last platelet count of 227,000 on 09/06/2016.  Hematology is  following. 4. Hypokalemia-potassium 3.2 this morning.  Will replace potassium and follow BMP in am.   Outpatient follow-ups  Oncology General surgery Gastroenterology  COVID-19 Labs  No results for input(s): DDIMER, FERRITIN, LDH, CRP in the last 72 hours.  Lab Results  Component Value Date   [place] NEGATIVE 04/28/2020     Scheduled medications:    CBG: Recent Labs  Lab 05/11/2020 1333  GLUCAP 100*    SpO2: 99 % O2 Flow Rate (L/min): 2 L/min    CBC: Recent Labs  Lab 05/21/2020 1344 05/21/2020 1344 05/11/2020 1800 05/01/20 1010 05/02/20 0234 04/29/2020 0209 05/04/20 0120  WBC 3.9*   < > 3.6* 5.7 5.5 6.0 6.7  NEUTROABS 3.3  --  3.0 4.4  --   --   --   HGB 2.9*   < > 2.7* 7.4* 7.2* 7.7* 7.1*  HCT 12.3*   < > 11.9* 25.3* 24.0* 26.2* 24.7*  MCV 75.0*   < > 73.0* 81.6 83.0 84.2 83.4  PLT 98*   < > 80* 91* 84* 86* 91*   < > = values in this interval not displayed.    Basic Metabolic Panel: Recent Labs  Lab 05/24/2020 1344 05/12/2020 1944 05/01/20 1010 05/02/20 0234 05/12/2020 0209 05/04/20 0120  NA 141  --  143 144 143 140  K 3.5  --  3.7 3.5 3.4* 3.2*  CL 110  --  112* 113* 111 105  CO2 21*  --  22 25 24 27   GLUCOSE 125*  --  99 127* 89 83  BUN 19  --  16 16 7* <5*  CREATININE 1.22*  --  1.03* 0.97 0.79 0.75  CALCIUM 8.6*  --  8.6* 8.7* 8.5* 8.7*  MG  --  2.0  --   --   --   --   PHOS  --  3.8  --   --   --   --     Antibiotics: Anti-infectives (From admission, onward)   None       DVT prophylaxis: SCDs  Code Status: Full code  Family Communication: No family at bedside    Status is: Inpatient  Dispo: The patient is from: Home              Anticipated d/c is to: Home              Anticipated d/c date is: 05/07/2020              Patient currently not medically stable for discharge  Barrier to discharge-sigmoid mass, general surgery consulted.       Consultants:  Hematology  Gastroenterology  Procedures:     Objective    Vitals:   05/18/2020 1556 04/28/2020 2003 04/30/2020 2300 05/04/20 0413  BP: 120/61 (!) 142/65 130/66 127/60  Pulse: 64 62 62 60  Resp: 16 (!) 23 12 16   Temp: 98.5 F (36.9 C) 98.1 F (36.7 C) 98.3 F (36.8 C) 98.4 F (36.9 C)  TempSrc: Oral Oral Oral Oral  SpO2: 93% 98% 99% 99%  Weight:    76.3 kg  Height:        Intake/Output Summary (Last 24 hours) at 05/04/2020 1224 Last data filed at 05/24/2020 1300 Gross per 24 hour  Intake 240 ml  Output --  Net 240 ml    09/05 1901 - 09/07 0700 In: 9924 [P.O.:740; I.V.:300] Out: 500 [Urine:500]  Filed Weights   05/04/2020 0240 05/12/2020 0700 05/04/20 0413  Weight: 78.8 kg 77.1 kg 76.3 kg    Physical Examination:    General-appears in no acute distress  Heart-S1-S2, regular, no murmur auscultated  Lungs-clear to auscultation bilaterally, no wheezing or crackles auscultated  Abdomen-soft, nontender, no organomegaly  Extremities-no edema in the lower extremities  Neuro-alert, oriented x3, no focal deficit noted    Data Reviewed:   Recent Results (from the past 240 hour(s))  SARS Coronavirus 2 by RT PCR (hospital order, performed in [place] hospital lab) Nasopharyngeal Nasopharyngeal Swab     Status: None   Collection Time: 05/20/2020  4:44 PM   Specimen: Nasopharyngeal Swab  Result Value Ref Range Status   SARS Coronavirus 2 NEGATIVE NEGATIVE Final    Comment: (NOTE) SARS-CoV-2 target nucleic acids are NOT DETECTED.  The SARS-CoV-2 RNA is generally detectable in upper and lower respiratory specimens during the acute phase of infection. The lowest concentration of SARS-CoV-2 viral copies this assay can detect is 250 copies / mL. A negative result does not preclude SARS-CoV-2 infection and should not be used as the sole basis for treatment or other patient management decisions.  A negative result may occur with improper specimen collection / handling, submission of specimen other than nasopharyngeal swab, presence of  viral mutation(s) within the areas targeted by this assay, and inadequate number of viral copies (<250 copies / mL). A negative result must be combined with clinical observations, patient history, and epidemiological information.  Fact Sheet for Patients:   StrictlyIdeas.no  Fact Sheet for Healthcare Providers: BankingDealers.co.za  This test is not yet approved or  cleared  by the [place] and has been authorized for detection and/or diagnosis of SARS-CoV-2 by FDA under an Emergency Use Authorization (EUA).  This EUA will remain in effect (meaning this test can be used) for the duration of the COVID-19 declaration under Section 564(b)(1) of the Act, 21 U.S.C. section 360bbb-3(b)(1), unless the authorization is terminated or revoked sooner.  Performed at [facility], [place] [address], [place], [place] [address]   MRSA PCR Screening     Status: None   Collection Time: 05/01/20 12:01 AM   Specimen: Nasopharyngeal  Result Value Ref Range Status   MRSA by PCR NEGATIVE NEGATIVE Final    Comment:        The GeneXpert MRSA Assay (FDA approved for NASAL specimens only), is one component of a comprehensive MRSA colonization surveillance program. It is not intended to diagnose MRSA infection nor to guide or monitor treatment for MRSA infections. Performed at [facility], [place] [address] [place], [place] [address]   Culture, Urine     Status: Abnormal   Collection Time: 05/02/20 10:15 AM   Specimen: Urine, Random  Result Value Ref Range Status   Specimen Description URINE, RANDOM  Final   Special Requests   Final    NONE Performed at [facility], [place] [address], [place], [place] [address]    Culture ENTEROCOCCUS FAECALIS (A)  Final   Report Status 05/04/2020 FINAL  Final   Organism ID, Bacteria ENTEROCOCCUS FAECALIS  Final      Susceptibility   Enterococcus faecalis - MIC*    AMPICILLIN <=2 SENSITIVE  Sensitive     NITROFURANTOIN <=16 SENSITIVE Sensitive     VANCOMYCIN 2 SENSITIVE Sensitive     * ENTEROCOCCUS FAECALIS    No results for input(s): LIPASE, AMYLASE in the last 168 hours. No results for input(s): AMMONIA in the last 168 hours.  Cardiac Enzymes: Recent Labs  Lab 05/26/2020 1944  CKTOTAL 64   BNP (last 3 results) Recent Labs    04/29/2020 1555  BNP 235.4*     Studies:  CT ABDOMEN PELVIS W WO CONTRAST  Result Date: 05/25/2020 CLINICAL DATA:  Sigmoid colon mass at colonoscopy. EXAM: CT ABDOMEN AND PELVIS WITHOUT AND WITH CONTRAST TECHNIQUE: Multidetector CT imaging of the abdomen and pelvis was performed following the standard protocol before and following the bolus administration of intravenous contrast. CONTRAST:  155mL OMNIPAQUE IOHEXOL 300 MG/ML  SOLN COMPARISON:  05/01/2020 abdominal ultrasound. The most recent CT 08/07/2016. FINDINGS: Lower chest: Dependent right lower lobe atelectasis. Normal heart size with LAD coronary artery calcification. Tiny right and trace left pleural effusions. Hepatobiliary: Subtly irregular hepatic capsule with caudate lobe enlargement. No focal liver lesion. Normal gallbladder, without biliary ductal dilatation. Pancreas: Normal, without mass or ductal dilatation. Spleen: Normal in size, without focal abnormality. Adrenals/Urinary Tract: Normal adrenal glands. Bilateral renal collecting system calculi on the order of 3 mm and less. Right extrarenal pelvis which contains a dependent 3 mm stone. Normal ureters and urinary bladder Stomach/Bowel: Normal stomach, without wall thickening. The rectum and sigmoid are underdistended, but no dominant sigmoid mass is seen. There is soft tissue thickening in the distal descending colon, including on 61/4, which could represent the site of primary. The descending colon appears thick walled more proximally on 52/4, possibly due to underdistention. Normal terminal ileum and appendix. Normal small bowel.  Vascular/Lymphatic: Aortic atherosclerosis. No abdominopelvic adenopathy. Reproductive: Normal uterus and adnexa. Other: Small volume abdominopelvic ascites. No evidence of omental  or peritoneal disease. Musculoskeletal: Convex right lumbar spine curvature. Advanced lumbosacral spondylosis. IMPRESSION: 1. Wall thickening focally involving the distal descending colon, most likely the site of primary. No dominant sigmoid mass identified. 2. No findings of metastatic disease within the abdomen or pelvis. 3. Bilateral pleural effusions and ascites, possibly representing fluid overload. 4. Hepatic morphology which suggests cirrhosis. Correlate with risk factors. 5. Bilateral nephrolithiasis. 6. Coronary artery atherosclerosis. Aortic Atherosclerosis (ICD10-I70.0). Electronically Signed   By: Abigail Miyamoto M.D.   On: 05/06/2020 14:52    [facility] If 7PM-7AM, please contact night-coverage at [email or web address], Office  [phone]   05/04/2020, 12:24 PM  LOS: 4 days

## 2020-05-04 NOTE — Evaluation (Signed)
Physical Therapy Evaluation Patient Details Name: Regina Richards MRN: [id number] DOB: [birth year] Today's Date: 05/04/2020   History of Present Illness  Pt is 74 yo female with PMH including anxiety, HTN, and kidney stones.  Pt presented with [facility] for 3-4 weeks.  She was found to have a hgb of 2.9 in the ED.  Pt admitted with symptomatic anemia and pancytopenia.  Pt had  colonscopy which showed partially obstructing sigmoid colon mass that was biopsied.  General surgery has been consulted - unclear if will proceed with surgery at this time or as outpt pending pathology report.  Clinical Impression  Pt admitted with above diagnosis. Pt's current hgb is 7.1 and she is to receive 1 unit of PRBC shortly; evaluation gait distance limited due to this.  Pt has been transferring to bsc with modified independence.  She was able to ambulate 40' in room with min guard , VSS, and DOE of 2/4.  She is normally very independent and works.  Pt will benefit from PT to advance her mobility in order to return to baseline.  Additionally, if pt does have surgery while hospitalized - will need continued PT assessment. Pt currently with functional limitations due to the deficits listed below (see PT Problem List). Pt will benefit from skilled PT to increase their independence and safety with mobility to allow discharge to the venue listed below.       Follow Up Recommendations No PT follow up;Supervision - Intermittent (if has surgery will need reassessment)    Equipment Recommendations  None recommended by PT    Recommendations for Other Services       Precautions / Restrictions Precautions Precautions: None      Mobility  Bed Mobility Overal bed mobility: Modified Independent             General bed mobility comments: Pt has been getting up/down to bsc independently with HOB elevated  Transfers Overall transfer level: Needs assistance Equipment used: None Transfers: Sit to/from Stand;Stand Pivot  Transfers Sit to Stand: Supervision Stand pivot transfers: Supervision          Ambulation/Gait Ambulation/Gait assistance: Min guard Gait Distance (Feet): 40 Feet Assistive device: None Gait Pattern/deviations: Step-through pattern;Decreased stride length;Trunk flexed Gait velocity: decreased   General Gait Details: Limited distance due to low hgb and to receive PRBC shortly (RN cautioned to limited mobility to in room).  Tolerated short distance ambulation with stable VS and no c/o dizziness.  Did have DOE of 2/4.  Stairs            Wheelchair Mobility    Modified Rankin (Stroke Patients Only)       Balance Overall balance assessment: Needs assistance Sitting-balance support: No upper extremity supported Sitting balance-Leahy Scale: Good     Standing balance support: No upper extremity supported Standing balance-Leahy Scale: Good                               Pertinent Vitals/Pain Pain Assessment: No/denies pain    Home Living Family/patient expects to be discharged to:: Private residence Living Arrangements: Alone Available Help at Discharge: Family;Available PRN/intermittently (pt has 5 sisters) Type of Home: Other(Comment) (condo) Home Access: Level entry     Home Layout: One level Home Equipment: Walker - 4 wheels;Grab bars - tub/shower      Prior Function Level of Independence: Independent         Comments: Able to do ADLs, IADLs,  community ambulation, driving, and 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, newspaper        Extremity/Trunk Assessment   Upper Extremity Assessment Upper Extremity Assessment: Overall WFL for tasks assessed    Lower Extremity Assessment Lower Extremity Assessment: Overall WFL for tasks assessed    Cervical / Trunk Assessment Cervical / Trunk Assessment: Kyphotic  Communication   Communication: No difficulties  Cognition Arousal/Alertness:  Awake/alert Behavior During Therapy: WFL for tasks assessed/performed Overall Cognitive Status: Within Functional Limits for tasks assessed                                        General Comments General comments (skin integrity, edema, etc.): VSS    Exercises     Assessment/Plan    PT Assessment Patient needs continued PT services  PT Problem List Decreased mobility;Decreased activity tolerance;Cardiopulmonary status limiting activity;Decreased balance;Decreased knowledge of use of DME;Decreased strength       PT Treatment Interventions DME instruction;Therapeutic activities;Gait training;Therapeutic exercise;Patient/family education;Balance training;Functional mobility training    PT Goals (Current goals can be found in the Care Plan section)  Acute Rehab PT Goals Patient Stated Goal: return home PT Goal Formulation: With patient Time For Goal Achievement: 05/18/20 Potential to Achieve Goals: Good    Frequency Min 3X/week   Barriers to discharge        Co-evaluation               AM-PAC PT "6 Clicks" Mobility  Outcome Measure Help needed turning from your back to your side while in a flat bed without using bedrails?: None Help needed moving from lying on your back to sitting on the side of a flat bed without using bedrails?: None Help needed moving to and from a bed to a chair (including a wheelchair)?: None Help needed standing up from a chair using your arms (e.g., wheelchair or bedside chair)?: None Help needed to walk in hospital room?: None Help needed climbing 3-5 steps with a railing? : A Little 6 Click Score: 23    End of Session Equipment Utilized During Treatment: Gait belt Activity Tolerance: Patient tolerated treatment well Patient left: in bed;with call bell/phone within reach Nurse Communication: Mobility status PT Visit Diagnosis: Other abnormalities of gait and mobility (R26.89)    Time: 2831-5176 PT Time Calculation (min)  (ACUTE ONLY): 20 min   Charges:   PT Evaluation $PT Eval Low Complexity: 1 Low          Gola Bribiesca, PT Acute Rehab Services Pager [phone] [facility] [phone]    Karlton Lemon 05/04/2020, 5:42 PM

## 2020-05-04 NOTE — Plan of Care (Signed)

## 2020-05-04 NOTE — Progress Notes (Signed)
Gastroenterology Inpatient Follow-up Note   PATIENT IDENTIFICATION  Regina Richards is a 74 y.o. female with a pmh significant for nephrolithiasis, hypertension (not on medications) who was admitted with anemia and iron deficiency and found to have colon polyps and a sigmoid colon mass. Hospital Day: 5  SUBJECTIVE  The biopsies from yesterday's colonoscopy remain pending. The patient denies any abdominal pain. The patient denies fevers or chills. Patient is still taking and all the information about the likely new diagnosis of colon cancer although biopsies still pending she is hopeful that the biopsies do not show cancer. CT scan yesterday showed results as below with some signs concerning for potential cirrhosis although no evidence of splenomegaly.  She has a prolonged thrombocytopenia. No family history of cirrhosis or other liver disease.   OBJECTIVE  Scheduled Inpatient Medications:  Continuous Inpatient Infusions:  . lactated ringers Stopped (05/19/2020 0915)  . potassium chloride 10 mEq (05/04/20 1200)   PRN Inpatient Medications: acetaminophen **OR** acetaminophen, ondansetron **OR** ondansetron (ZOFRAN) IV   Physical Examination  Temp:  [98.1 F (36.7 C)-98.5 F (36.9 C)] 98.4 F (36.9 C) (09/07 0413) Pulse Rate:  [60-64] 60 (09/07 0413) Resp:  [12-23] 16 (09/07 0413) BP: (120-142)/(60-66) 127/60 (09/07 0413) SpO2:  [93 %-99 %] 99 % (09/07 0413) Weight:  [76.3 kg] 76.3 kg (09/07 0413) Temp (24hrs), Avg:98.3 F (36.8 C), Min:98.1 F (36.7 C), Max:98.5 F (36.9 C)  Weight: 76.3 kg GEN: NAD, appears stated age, doesn't appear chronically ill PSYCH: Cooperative, without pressured speech EYE: Conjunctivae pink, sclerae anicteric ENT: MMM CV: Nontachycardic RESP: No audible wheezing GI: NABS, soft, NT/ND, without rebound or guarding GU: DRE shows MSK/EXT: No lower extremity edema SKIN: No jaundice, no overt spider angiomata NEURO:  Alert & Oriented x 3, no focal  deficits, no evidence of asterixis   Review of Data   Laboratory Studies   Recent Labs  Lab 05/24/2020 1944 05/01/20 1010 05/04/20 0120  NA  --    < > 140  K  --    < > 3.2*  CL  --    < > 105  CO2  --    < > 27  BUN  --    < > <5*  CREATININE  --    < > 0.75  GLUCOSE  --    < > 83  CALCIUM  --    < > 8.7*  MG 2.0  --   --   PHOS 3.8  --   --    < > = values in this interval not displayed.   Recent Labs  Lab 05/02/20 0234  AST 38  ALT 30  ALKPHOS 78    Recent Labs  Lab 05/02/20 0234 05/02/20 0234 05/24/2020 0209 05/24/2020 0209 05/04/20 0120  WBC 5.5   < > 6.0   < > 6.7  HGB 7.2*   < > 7.7*   < > 7.1*  HCT 24.0*   < > 26.2*   < > 24.7*  PLT 84*  --  86*  --  91*   < > = values in this interval not displayed.   Recent Labs  Lab 05/17/2020 1944  APTT 28  INR 1.3*   MELD-Na score: 12 at 05/02/2020  2:34 AM MELD score: 12 at 05/02/2020  2:34 AM Calculated from: Serum Creatinine: 0.97 mg/dL (Using min of 1 mg/dL) at 05/02/2020  2:34 AM Serum Sodium: 144 mmol/L (Using max of 137 mmol/L) at 05/02/2020  2:34 AM  Total Bilirubin: 2.1 mg/dL at 05/02/2020  2:34 AM INR(ratio): 1.3 at 05/23/2020  7:44 PM Age: 54 years  Imaging Studies  CT ABDOMEN PELVIS W WO CONTRAST  Result Date: 05/16/2020 CLINICAL DATA:  Sigmoid colon mass at colonoscopy. EXAM: CT ABDOMEN AND PELVIS WITHOUT AND WITH CONTRAST TECHNIQUE: Multidetector CT imaging of the abdomen and pelvis was performed following the standard protocol before and following the bolus administration of intravenous contrast. CONTRAST:  193mL OMNIPAQUE IOHEXOL 300 MG/ML  SOLN COMPARISON:  05/01/2020 abdominal ultrasound. The most recent CT 08/07/2016. FINDINGS: Lower chest: Dependent right lower lobe atelectasis. Normal heart size with LAD coronary artery calcification. Tiny right and trace left pleural effusions. Hepatobiliary: Subtly irregular hepatic capsule with caudate lobe enlargement. No focal liver lesion. Normal gallbladder, without  biliary ductal dilatation. Pancreas: Normal, without mass or ductal dilatation. Spleen: Normal in size, without focal abnormality. Adrenals/Urinary Tract: Normal adrenal glands. Bilateral renal collecting system calculi on the order of 3 mm and less. Right extrarenal pelvis which contains a dependent 3 mm stone. Normal ureters and urinary bladder Stomach/Bowel: Normal stomach, without wall thickening. The rectum and sigmoid are underdistended, but no dominant sigmoid mass is seen. There is soft tissue thickening in the distal descending colon, including on 61/4, which could represent the site of primary. The descending colon appears thick walled more proximally on 52/4, possibly due to underdistention. Normal terminal ileum and appendix. Normal small bowel. Vascular/Lymphatic: Aortic atherosclerosis. No abdominopelvic adenopathy. Reproductive: Normal uterus and adnexa. Other: Small volume abdominopelvic ascites. No evidence of omental or peritoneal disease. Musculoskeletal: Convex right lumbar spine curvature. Advanced lumbosacral spondylosis. IMPRESSION: 1. Wall thickening focally involving the distal descending colon, most likely the site of primary. No dominant sigmoid mass identified. 2. No findings of metastatic disease within the abdomen or pelvis. 3. Bilateral pleural effusions and ascites, possibly representing fluid overload. 4. Hepatic morphology which suggests cirrhosis. Correlate with risk factors. 5. Bilateral nephrolithiasis. 6. Coronary artery atherosclerosis. Aortic Atherosclerosis (ICD10-I70.0). Electronically Signed   By: Abigail Miyamoto M.D.   On: 05/15/2020 14:52     ASSESSMENT  Ms. Grimley is a 74 y.o. female with a pmh significant for nephrolithiasis, hypertension (not on medications) who was admitted with anemia and iron deficiency and found to have colon polyps and a sigmoid colon mass.  Patient is stable from yesterday's colonoscopy.  Likely underlying colon cancer although biopsy still  pending.  I reviewed the patient's results of her CT scan where there was findings concerning for potential underlying liver disease/cirrhosis.  Her liver biochemical testing had been decreasing as of yesterday.  No liver biochemical tests drawn today.  She has no evidence of splenomegaly and although she has thrombocytopenia it has been longer standing.  I suspect that she does not have underlying cirrhosis but I offered her an upper endoscopy to further evaluate whether she may have signs of portal hypertension.  She declines this at this time.  She wants to wait for her biopsies of her colon to come back first.  I will proceed with obtaining some serologies to try and rule out other etiologies for chronic liver disease.  Liver biopsy could be something that is considered in the future but again not sure that it is absolutely indicated or necessary at this time especially liver biochemical tests continue to improve and normalize over the course of the coming days.  Hopefully biopsies will be back later today or tomorrow.  Appreciate oncology and surgery evaluation of the patient.   PLAN/RECOMMENDATIONS  Liver serologies have been ordered EGD offered to patient to evaluate for signs of portal hypertension but she defers on this at this time Follow-up pathology from colonoscopy Do not see role of liver biopsy at this time but certainly can be considered by her surgery team if they felt things would substantially change if she had underlying cirrhosis to consider at least an endoscopy first to the biopsy.   We will await the biopsies, but GI will sign off for now but please call/page with questions or concerns   Justice Britain, MD [facility] Advanced Endoscopy Office # [phone]    LOS: 4 days  [facility]  05/04/2020, 12:04 PM

## 2020-05-04 NOTE — Consult Note (Signed)
Regina Richards [birth year]  [id number].    Requesting MD: Dr. Eleonore Chiquito Chief Complaint/Reason for Consult: sigmoid colon mass  HPI:  This is a 74 yo white female with a history of anxiety and by chart HTN, which she denies who for the last several weeks admits to feeling weak and having to hold onto the walls etc because she feels so bad.  She admits to SOB.  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  She is pleasant, but seems confused as she struggled to be able to tell me the information above.  She states she has never had a colonoscopy.  She states that she thinks maybe she has seen blood in her stool and that they have been more ribbon-like recently, but then says that she doesn't at her stools so doesn't know.  She does admit to some constipation.  She apparently went to her PCP last week and was found to have a hgb of 2.9 as well as plts under 100K.  She was sent to the [facility] where she was admitted for her anemia.  She has received 3 units of pRBCs and currently have a hgb of 7.1.  GI was consulted and she underwent a colonoscopy which revealed multiple polyps scattered throughout her colon with a 2cm polyp in her cecum, some in her rectum, etc.  She was also found to have a circumferential partially obstructing colon mass in her sigmoid colon.  We have been asked to see her for further evaluation and recommendations.  Of note, she has a CT scan that confirms wall thickening of the sigmoid colon c/w mass noted on c-scope.  She also has evidence of cirrhosis and mild ascites.  The patient states she likes beer and drinks at least 2 beers/day.  No evidence of metastatic disease on abd/pel scan.  No Chest done at the time.  ROS: ROS: Please see HPI, otherwise all other systems have been reviewed and are negative except some swelling in her ankles at times.  History reviewed. No pertinent family history.  Past Medical History:  Diagnosis Date  . Anxiety   . Arthritis   .  Dysrhythmia    hx of murmur   . History of kidney stones   . Hypertension    off bp meds " for a while"  . Pneumonia last 10 yrs ago   hx of  . PONV (postoperative nausea and vomiting)    hx of    Past Surgical History:  Procedure Laterality Date  . BIOPSY  05/14/2020   Procedure: BIOPSY;  Surgeon: Ladene Artist, MD;  Location: [facility];  Service: Endoscopy;;  . COLONOSCOPY WITH PROPOFOL N/A 04/30/2020   Procedure: COLONOSCOPY WITH PROPOFOL;  Surgeon: Ladene Artist, MD;  Location: [facility] ENDOSCOPY;  Service: Endoscopy;  Laterality: N/A;  . CYSTOSCOPY W/ URETERAL STENT PLACEMENT Bilateral 08/03/2016   Procedure: CYSTOSCOPY WITH BILATERAL STENT REPLACEMENT, BILATERAL RETROGRADE PYELOGRAM;  Surgeon: Kathie Rhodes, MD;  Location: [facility];  Service: Urology;  Laterality: Bilateral;  . CYSTOSCOPY/RETROGRADE/URETEROSCOPY/STONE EXTRACTION WITH BASKET Bilateral 09/08/2016   Procedure: BILATERAL CYSTOSCOPY/RETROGRADE/URETEROSCOPY/STONE EXTRACTION / HOLMIUM LASER /STENT REMOVAL;  Surgeon: Kathie Rhodes, MD;  Location: [facility];  Service: Urology;  Laterality: Bilateral;  . CYSTOSCOPY/URETEROSCOPY/HOLMIUM LASER/STENT PLACEMENT Right 09/23/2018   Procedure: CYSTOSCOPY/RETROGRADE/URETEROSCOPY/STENT PLACEMENT RIGHT ;  Surgeon: Kathie Rhodes, MD;  Location: [facility];  Service: Urology;  Laterality: Right;  . HEMOSTASIS CLIP PLACEMENT  05/04/2020   Procedure: HEMOSTASIS CLIP PLACEMENT;  Surgeon: Ladene Artist, MD;  Location: [facility] ENDOSCOPY;  Service: Endoscopy;;  . polyp removed from uterus    . POLYPECTOMY  05/21/2020   Procedure: POLYPECTOMY;  Surgeon: Ladene Artist, MD;  Location: [facility] ENDOSCOPY;  Service: Endoscopy;;  . SUBMUCOSAL TATTOO INJECTION  05/23/2020   Procedure: SUBMUCOSAL TATTOO INJECTION;  Surgeon: Ladene Artist, MD;  Location: [facility] ENDOSCOPY;  Service: Endoscopy;;    Social History:  reports that she has never smoked. She has never used smokeless tobacco. She reports current  alcohol use of about 14.0 standard drinks of alcohol per week. She reports that she does not use drugs.  Allergies:  Allergies  Allergen Reactions  . Sulfa Antibiotics Rash    Rash on legs    Medications Prior to Admission  Medication Sig Dispense Refill  . PARoxetine (PAXIL) 40 MG tablet TAKE 1 TABLET(40 MG) BY MOUTH EVERY MORNING (Patient taking differently: Take 40 mg by mouth daily. ) 30 tablet 3  . ciprofloxacin (CIPRO) 500 MG tablet Take 500 mg by mouth 2 (two) times daily.       Physical Exam: Blood pressure 127/60, pulse 60, temperature 98.4 F (36.9 C), temperature source Oral, resp. rate 16, height 5\' 4"  (1.626 m), weight 76.3 kg, SpO2 99 %. General: pleasant, WD, WN white female who is laying in bed in NAD, but still appears pale. HEENT: head is normocephalic, atraumatic.  Sclera are noninjected.  PERRL.  Ears and nose without any masses or lesions.  Mouth is pink and moist Heart: regular but bradycardic in the 40s when I entered the room and patient was asleep.  After waking up she went into the 60s and had some occasional skipped beats.  Normal s1,s2. No obvious gallops, or rubs noted. +murmur.  Palpable radial and pedal pulses bilaterally Lungs: CTAB, no wheezes, rhonchi, or rales noted.  Respiratory effort nonlabored Abd: soft, NT, poochy lower abdomen, +BS, no masses, hernias, or organomegaly MS: all 4 extremities are symmetrical with no cyanosis, clubbing.  Trace pedal edema bilaterally Skin: warm and dry with no masses, lesions, or rashes Neuro: Cranial nerves 2-12 grossly intact, sensation is normal throughout Psych: A&Ox3 but seems forgetful and has difficultly recalling a history   Results for orders placed or performed during the hospital encounter of 05/04/2020 (from the past 48 hour(s))  CBC     Status: Abnormal   Collection Time: 05/01/2020  2:09 AM  Result Value Ref Range   WBC 6.0 4.0 - 10.5 K/uL   RBC 3.11 (L) 3.87 - 5.11 MIL/uL   Hemoglobin 7.7 (L) 12.0 -  15.0 g/dL   HCT 26.2 (L) 36 - 46 %   MCV 84.2 80.0 - 100.0 fL   MCH 24.8 (L) 26.0 - 34.0 pg   MCHC 29.4 (L) 30.0 - 36.0 g/dL   RDW 20.6 (H) 11.5 - 15.5 %   Platelets 86 (L) 150 - 400 K/uL    Comment: Immature Platelet Fraction may be clinically indicated, consider ordering this additional test [id number] CONSISTENT WITH PREVIOUS RESULT    nRBC 0.3 (H) 0.0 - 0.2 %    Comment: Performed at [facility], [address], [place], [place] [address]  Basic metabolic panel     Status: Abnormal   Collection Time: 05/19/2020  2:09 AM  Result Value Ref Range   Sodium 143 135 - 145 mmol/L   Potassium 3.4 (L) 3.5 - 5.1 mmol/L   Chloride 111 98 - 111 mmol/L   CO2 24 22 -  32 mmol/L   Glucose, Bld 89 70 - 99 mg/dL    Comment: Glucose reference range applies only to samples taken after fasting for at least 8 hours.   BUN 7 (L) 8 - 23 mg/dL   Creatinine, Ser 0.79 0.44 - 1.00 mg/dL   Calcium 8.5 (L) 8.9 - 10.3 mg/dL   GFR calc non Af Amer >60 >60 mL/min   GFR calc Af Amer >60 >60 mL/min   Anion gap 8 5 - 15    Comment: Performed at [place] [facility] [address] [place], [place] [address]  CBC     Status: Abnormal   Collection Time: 05/04/20  1:20 AM  Result Value Ref Range   WBC 6.7 4.0 - 10.5 K/uL   RBC 2.96 (L) 3.87 - 5.11 MIL/uL   Hemoglobin 7.1 (L) 12.0 - 15.0 g/dL   HCT 24.7 (L) 36 - 46 %   MCV 83.4 80.0 - 100.0 fL   MCH 24.0 (L) 26.0 - 34.0 pg   MCHC 28.7 (L) 30.0 - 36.0 g/dL   RDW 21.5 (H) 11.5 - 15.5 %   Platelets 91 (L) 150 - 400 K/uL    Comment: Immature Platelet Fraction may be clinically indicated, consider ordering this additional test [id number] CONSISTENT WITH PREVIOUS RESULT    nRBC 0.9 (H) 0.0 - 0.2 %    Comment: Performed at [facility], [place] [address], [place], [place] [address]  Basic metabolic panel     Status: Abnormal   Collection Time: 05/04/20  1:20 AM  Result Value Ref Range   Sodium 140 135 - 145 mmol/L   Potassium 3.2 (L) 3.5 - 5.1 mmol/L     Chloride 105 98 - 111 mmol/L   CO2 27 22 - 32 mmol/L   Glucose, Bld 83 70 - 99 mg/dL    Comment: Glucose reference range applies only to samples taken after fasting for at least 8 hours.   BUN <5 (L) 8 - 23 mg/dL   Creatinine, Ser 0.75 0.44 - 1.00 mg/dL   Calcium 8.7 (L) 8.9 - 10.3 mg/dL   GFR calc non Af Amer >60 >60 mL/min   GFR calc Af Amer >60 >60 mL/min   Anion gap 8 5 - 15    Comment: Performed at [place] [address], [place], [place] [address]   CT ABDOMEN PELVIS W WO CONTRAST  Result Date: 04/30/2020 CLINICAL DATA:  Sigmoid colon mass at colonoscopy. EXAM: CT ABDOMEN AND PELVIS WITHOUT AND WITH CONTRAST TECHNIQUE: Multidetector CT imaging of the abdomen and pelvis was performed following the standard protocol before and following the bolus administration of intravenous contrast. CONTRAST:  126mL OMNIPAQUE IOHEXOL 300 MG/ML  SOLN COMPARISON:  05/01/2020 abdominal ultrasound. The most recent CT 08/07/2016. FINDINGS: Lower chest: Dependent right lower lobe atelectasis. Normal heart size with LAD coronary artery calcification. Tiny right and trace left pleural effusions. Hepatobiliary: Subtly irregular hepatic capsule with caudate lobe enlargement. No focal liver lesion. Normal gallbladder, without biliary ductal dilatation. Pancreas: Normal, without mass or ductal dilatation. Spleen: Normal in size, without focal abnormality. Adrenals/Urinary Tract: Normal adrenal glands. Bilateral renal collecting system calculi on the order of 3 mm and less. Right extrarenal pelvis which contains a dependent 3 mm stone. Normal ureters and urinary bladder Stomach/Bowel: Normal stomach, without wall thickening. The rectum and sigmoid are underdistended, but no dominant sigmoid mass is seen. There is soft tissue thickening in the distal descending colon, including on 61/4, which  could represent the site of primary. The descending colon appears thick walled more proximally on 52/4, possibly due to  underdistention. Normal terminal ileum and appendix. Normal small bowel. Vascular/Lymphatic: Aortic atherosclerosis. No abdominopelvic adenopathy. Reproductive: Normal uterus and adnexa. Other: Small volume abdominopelvic ascites. No evidence of omental or peritoneal disease. Musculoskeletal: Convex right lumbar spine curvature. Advanced lumbosacral spondylosis. IMPRESSION: 1. Wall thickening focally involving the distal descending colon, most likely the site of primary. No dominant sigmoid mass identified. 2. No findings of metastatic disease within the abdomen or pelvis. 3. Bilateral pleural effusions and ascites, possibly representing fluid overload. 4. Hepatic morphology which suggests cirrhosis. Correlate with risk factors. 5. Bilateral nephrolithiasis. 6. Coronary artery atherosclerosis. Aortic Atherosclerosis (ICD10-I70.0). Electronically Signed   By: Abigail Miyamoto M.D.   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  A new INR is pending.  We will add an ammonia level as the patient was very forgetful and had trouble giving her history.  Her plts are under 100K which may be secondary to her anemia from her colon mass, but may in part be secondary to her cirrhosis as well. Anemia - likely mostly secondary to colon mass, but also likely to have some chronic component from her ETOH abuse as well.  Tx as needed.  Will need to be higher than 7 prior to surgery.  Colon mass The patient has a partially obstructing sigmoid colon mass noted on c-scope yesterday. Her anemia is likely mostly secondary from this mass.  She was also noted to have multiple polyps on her c-scope and a 2cm polyp in her cecum as well as some subcentimeter and centimeter in size elsewhere.  Will await pathology of these polyps to make sure we do not miss any other malignancies.  Remain on clear liquids for now.  Will discuss with MD regarding timing of surgery.  Given she  isn't completely obstructed, she could be discharged potentially to follow up with one of our colorectal surgeons who are able to do this procedure lap vs robotic, etc.  I will place her on miralax to help keep her bowels moving.  She will certainly be at higher risk than expected given her new findings of cirrhosis for bleeding, etc.  We will follow the patient while here and await path for further decision making.   FEN - CLD VTE - on hold due to profound anemia ID - none currently   Henreitta Cea, [facility] Surgery 05/04/2020, 1:49 PM Please see Amion for pager number during day hours 7:00am-4:30pm or 7:00am -11:30am on weekends

## 2020-05-05 LAB — CBC
HCT: 30.4 % — ABNORMAL LOW (ref 36.0–46.0)
Hemoglobin: 8.9 g/dL — ABNORMAL LOW (ref 12.0–15.0)
MCH: 25.5 pg — ABNORMAL LOW (ref 26.0–34.0)
MCHC: 29.3 g/dL — ABNORMAL LOW (ref 30.0–36.0)
MCV: 87.1 fL (ref 80.0–100.0)
Platelets: 96 10*3/uL — ABNORMAL LOW (ref 150–400)
RBC: 3.49 MIL/uL — ABNORMAL LOW (ref 3.87–5.11)
RDW: 23 % — ABNORMAL HIGH (ref 11.5–15.5)
WBC: 6.2 10*3/uL (ref 4.0–10.5)
nRBC: 0.6 % — ABNORMAL HIGH (ref 0.0–0.2)

## 2020-05-05 LAB — PROTIME-INR
INR: 1.2 (ref 0.8–1.2)
Prothrombin Time: 15.1 seconds (ref 11.4–15.2)

## 2020-05-05 LAB — BASIC METABOLIC PANEL
Anion gap: 7 (ref 5–15)
BUN: <5 mg/dL — ABNORMAL LOW (ref 8–23)
CO2: 28 mmol/L (ref 22–32)
Calcium: 9 mg/dL (ref 8.9–10.3)
Chloride: 106 mmol/L (ref 98–111)
Creatinine, Ser: 0.84 mg/dL (ref 0.44–1.00)
GFR calc Af Amer: >60 mL/min (ref >60)
GFR calc non Af Amer: >60 mL/min (ref >60)
Glucose, Bld: 84 mg/dL (ref 70–99)
Potassium: 3.2 mmol/L — ABNORMAL LOW (ref 3.5–5.1)
Sodium: 141 mmol/L (ref 135–145)

## 2020-05-05 LAB — HCV AB W REFLEX TO QUANT PCR: HCV Ab: <0.1 s/co ratio (ref 0.0–0.9)

## 2020-05-05 LAB — SURGICAL PATHOLOGY

## 2020-05-05 LAB — GLIA (IGA/G) + TTG IGA
Antigliadin Abs, IgA: 4 units (ref 0–19)
Gliadin IgG: 2 units (ref 0–19)
Tissue Transglutaminase Ab, IgA: <2 U/mL (ref 0–3)

## 2020-05-05 LAB — HCV INTERPRETATION

## 2020-05-05 LAB — HEPATITIS B SURFACE ANTIBODY, QUANTITATIVE: Hep B S AB Quant (Post): <3.1 m[IU]/mL — ABNORMAL LOW (ref >9.9)

## 2020-05-05 LAB — CEA: CEA: 11.9 ng/mL — ABNORMAL HIGH (ref 0.0–4.7)

## 2020-05-05 LAB — PREPARE RBC (CROSSMATCH)

## 2020-05-05 LAB — PATHOLOGIST SMEAR REVIEW

## 2020-05-05 MED ORDER — CHLORHEXIDINE GLUCONATE CLOTH 2 % EX PADS: 6.0000 | MEDICATED_PAD | Freq: Once | CUTANEOUS | Status: DC

## 2020-05-05 MED ORDER — SODIUM CHLORIDE 0.9 % IV SOLN
2.0000 g | INTRAVENOUS | Status: AC
  Administered 2020-05-06: 2 g via INTRAVENOUS
  Filled 2020-05-05 (×3): qty 2

## 2020-05-05 MED ORDER — POTASSIUM CHLORIDE CRYS ER 20 MEQ PO TBCR
40.0000 meq | EXTENDED_RELEASE_TABLET | Freq: Once | ORAL | Status: AC
  Administered 2020-05-05: 40 meq via ORAL
  Filled 2020-05-05: qty 2

## 2020-05-05 MED ORDER — SODIUM CHLORIDE 0.9% IV SOLUTION: Freq: Once | INTRAVENOUS | Status: DC

## 2020-05-05 MED ORDER — ACETAMINOPHEN 500 MG PO TABS
1000.0000 mg | ORAL_TABLET | ORAL | Status: AC
  Administered 2020-05-06: 1000 mg via ORAL
  Filled 2020-05-05: qty 2

## 2020-05-05 MED ORDER — AMOXICILLIN 500 MG PO CAPS
500.0000 mg | ORAL_CAPSULE | Freq: Two times a day (BID) | ORAL | Status: AC
  Administered 2020-05-05 – 2020-05-10 (×9): 500 mg via ORAL
  Filled 2020-05-05 (×10): qty 1

## 2020-05-05 MED ORDER — ALVIMOPAN 12 MG PO CAPS
12.0000 mg | ORAL_CAPSULE | ORAL | Status: AC
  Administered 2020-05-06: 12 mg via ORAL
  Filled 2020-05-05 (×2): qty 1

## 2020-05-05 MED ORDER — CHLORHEXIDINE GLUCONATE CLOTH 2 % EX PADS
6.0000 | MEDICATED_PAD | Freq: Once | CUTANEOUS | Status: AC
  Administered 2020-05-05: 6 via TOPICAL

## 2020-05-05 MED ORDER — BUPIVACAINE LIPOSOME 1.3 % IJ SUSP
20.0000 mL | Freq: Once | INTRAMUSCULAR | Status: DC
  Filled 2020-05-05: qty 20

## 2020-05-05 NOTE — Progress Notes (Signed)
PROGRESS NOTE    Regina Richards  BZJ:[id number] DOB: 11-04-1945 DOA: 05/20/2020 PCP: Patient, No Pcp Per    Chief Complaint  Patient presents with  . Loss of Consciousness    Brief Narrative:   74 year old lady with prior history of essential hypertension, anxiety, anemia brought in for exertional chest pain secondary to severe anemia.  Hemoglobin was 2.9 on admission currently around 8.  FOBT was negative in the ED.  GI was consulted patient underwent colonoscopy showing an obstructing sigmoid colon mass which was biopsied and results are pending.  Oncology and general surgery consulted for further recommendations.  Assessment & Plan:   Principal Problem:   Symptomatic anemia Active Problems:   Essential hypertension, benign   Pancytopenia (HCC)   History of kidney stones   CKD (chronic kidney disease), stage III   Syncope and collapse   Bradycardia   Thrombocytopenia (HCC)   Iron deficiency anemia   Benign neoplasm of cecum   Benign neoplasm of transverse colon   Benign neoplasm of descending colon   Benign neoplasm of rectum   Colonic mass     Severe iron deficiency anemia probably secondary to sigmoid mass.  Patient was admitted with a hemoglobin of 2.9, s/p 3 units of PRBC transfusion and hemoglobin stable between 7-8.  Patient also underwent 1 dose of IV iron on 05/01/2020 and a repeat dose of Feraheme scheduled to be given on 05/08/2020.     Sigmoid colon mass Seen at the time of colonoscopy by GI, biopsied and results are pending.  General surgery consulted for evaluation of resection/sigmoidectomy.    Mild to moderate thrombocytopenia Unclear etiology  no evidence of bleeding hematology on board.    Hypokalemia;  Replaced.   Enterococcus UTI:  ON amoxicillin.      DVT prophylaxis: scd's Code Status: FULL CODE.  Family Communication:none at bedside.  Disposition:   Status is: Inpatient  Remains inpatient appropriate because:Ongoing diagnostic  testing needed not appropriate for outpatient work up   Dispo: The patient is from: Home              Anticipated d/c is to: Home              Anticipated d/c date is: 2 days              Patient currently is not medically stable to d/c.       Consultants:   Gastroenterology Hematology General surgery   Procedures: Colonoscopy   Antimicrobials: None.    Subjective: No new complaints.   Objective: Vitals:   05/05/20 0350 05/05/20 0500 05/05/20 0741 05/05/20 1054  BP: (!) 156/65  (!) 154/69   Pulse: 66     Resp: 17  12   Temp: 97.8 F (36.6 C)  97.8 F (36.6 C) 97.8 F (36.6 C)  TempSrc: Oral  Oral Oral  SpO2: 93%     Weight:  76.5 kg    Height:        Intake/Output Summary (Last 24 hours) at 05/05/2020 1435 Last data filed at 05/04/2020 2030 Gross per 24 hour  Intake 514 ml  Output --  Net 514 ml   Filed Weights   05/16/2020 0700 05/04/20 0413 05/05/20 0500  Weight: 77.1 kg 76.3 kg 76.5 kg    Examination:  General exam: Appears calm and comfortable  Respiratory system: Clear to auscultation. Respiratory effort normal. Cardiovascular system: S1 & S2 heard, RRR. No JVD,No pedal edema. Gastrointestinal system: Abdomen is nondistended, soft and  nontender.  Normal bowel sounds heard. Central nervous system: Alert and oriented. No focal neurological deficits. Extremities: Symmetric 5 x 5 power. Skin: No rashes, lesions or ulcers Psychiatry:  Mood & affect appropriate.     Data Reviewed: I have personally reviewed following labs and imaging studies  CBC: Recent Labs  Lab 04/29/2020 1344 05/15/2020 1344 05/26/2020 1800 05/12/2020 1800 05/01/20 1010 05/02/20 0234 04/30/2020 0209 05/04/20 0120 05/05/20 0034  WBC 3.9*   < > 3.6*   < > 5.7 5.5 6.0 6.7 6.2  NEUTROABS 3.3  --  3.0  --  4.4  --   --   --   --   HGB 2.9*   < > 2.7*   < > 7.4* 7.2* 7.7* 7.1* 8.9*  HCT 12.3*   < > 11.9*   < > 25.3* 24.0* 26.2* 24.7* 30.4*  MCV 75.0*   < > 73.0*   < > 81.6 83.0 84.2  83.4 87.1  PLT 98*   < > 80*   < > 91* 84* 86* 91* 96*   < > = values in this interval not displayed.    Basic Metabolic Panel: Recent Labs  Lab 05/17/2020 1944 05/01/20 1010 05/02/20 0234 05/08/2020 0209 05/04/20 0120 05/04/20 1427 05/05/20 0034  NA  --    < > 144 143 140 141 141  K  --    < > 3.5 3.4* 3.2* 3.6 3.2*  CL  --    < > 113* 111 105 106 106  CO2  --    < > 25 24 27 26 28   GLUCOSE  --    < > 127* 89 83 92 84  BUN  --    < > 16 7* <5* <5* <5*  CREATININE  --    < > 0.97 0.79 0.75 0.81 0.84  CALCIUM  --    < > 8.7* 8.5* 8.7* 8.8* 9.0  MG 2.0  --   --   --   --   --   --   PHOS 3.8  --   --   --   --   --   --    < > = values in this interval not displayed.    GFR: Estimated Creatinine Clearance: 59.7 mL/min (by C-G formula based on SCr of 0.84 mg/dL).  Liver Function Tests: Recent Labs  Lab 05/01/20 1010 05/02/20 0234 05/04/20 1427  AST 38 38 36  ALT 29 30 31   ALKPHOS 79 78 92  BILITOT 3.1* 2.1* 1.4*  PROT 6.2* 5.7* 5.8*  ALBUMIN 3.3* 2.9* 3.0*    CBG: Recent Labs  Lab 05/26/2020 1333  GLUCAP 100*     Recent Results (from the past 240 hour(s))  SARS Coronavirus 2 by RT PCR (hospital order, performed in [facility] hospital lab) Nasopharyngeal Nasopharyngeal Swab     Status: None   Collection Time: 05/07/2020  4:44 PM   Specimen: Nasopharyngeal Swab  Result Value Ref Range Status   SARS Coronavirus 2 NEGATIVE NEGATIVE Final    Comment: (NOTE) SARS-CoV-2 target nucleic acids are NOT DETECTED.  The SARS-CoV-2 RNA is generally detectable in upper and lower respiratory specimens during the acute phase of infection. The lowest concentration of SARS-CoV-2 viral copies this assay can detect is 250 copies / mL. A negative result does not preclude SARS-CoV-2 infection and should not be used as the sole basis for treatment or other patient management decisions.  A negative result may occur with improper specimen collection /  handling, submission of specimen  other than nasopharyngeal swab, presence of viral mutation(s) within the areas targeted by this assay, and inadequate number of viral copies (<250 copies / mL). A negative result must be combined with clinical observations, patient history, and epidemiological information.  Fact Sheet for Patients:   StrictlyIdeas.no  Fact Sheet for Healthcare Providers: BankingDealers.co.za  This test is not yet approved or  cleared by the [place] FDA and has been authorized for detection and/or diagnosis of SARS-CoV-2 by FDA under an Emergency Use Authorization (EUA).  This EUA will remain in effect (meaning this test can be used) for the duration of the COVID-19 declaration under Section 564(b)(1) of the Act, 21 U.S.C. section 360bbb-3(b)(1), unless the authorization is terminated or revoked sooner.  Performed at [facility], [place] [address], [place] [address]   MRSA PCR Screening     Status: None   Collection Time: 05/01/20 12:01 AM   Specimen: Nasopharyngeal  Result Value Ref Range Status   MRSA by PCR NEGATIVE NEGATIVE Final    Comment:        The GeneXpert MRSA Assay (FDA approved for NASAL specimens only), is one component of a comprehensive MRSA colonization surveillance program. It is not intended to diagnose MRSA infection nor to guide or monitor treatment for MRSA infections. Performed at [facility], [place] [address], [place], [place] [address]   Culture, Urine     Status: Abnormal   Collection Time: 05/02/20 10:15 AM   Specimen: Urine, Random  Result Value Ref Range Status   Specimen Description URINE, RANDOM  Final   Special Requests   Final    NONE Performed at [facility], [place] [address], [place], [place] [address]    Culture ENTEROCOCCUS FAECALIS (A)  Final   Report Status 05/04/2020 FINAL  Final   Organism ID, Bacteria ENTEROCOCCUS FAECALIS  Final      Susceptibility   Enterococcus  faecalis - MIC*    AMPICILLIN <=2 SENSITIVE Sensitive     NITROFURANTOIN <=16 SENSITIVE Sensitive     VANCOMYCIN 2 SENSITIVE Sensitive     * ENTEROCOCCUS FAECALIS         Radiology Studies: CT ABDOMEN PELVIS W WO CONTRAST  Result Date: 05/02/2020 CLINICAL DATA:  Sigmoid colon mass at colonoscopy. EXAM: CT ABDOMEN AND PELVIS WITHOUT AND WITH CONTRAST TECHNIQUE: Multidetector CT imaging of the abdomen and pelvis was performed following the standard protocol before and following the bolus administration of intravenous contrast. CONTRAST:  151mL OMNIPAQUE IOHEXOL 300 MG/ML  SOLN COMPARISON:  05/01/2020 abdominal ultrasound. The most recent CT 08/07/2016. FINDINGS: Lower chest: Dependent right lower lobe atelectasis. Normal heart size with LAD coronary artery calcification. Tiny right and trace left pleural effusions. Hepatobiliary: Subtly irregular hepatic capsule with caudate lobe enlargement. No focal liver lesion. Normal gallbladder, without biliary ductal dilatation. Pancreas: Normal, without mass or ductal dilatation. Spleen: Normal in size, without focal abnormality. Adrenals/Urinary Tract: Normal adrenal glands. Bilateral renal collecting system calculi on the order of 3 mm and less. Right extrarenal pelvis which contains a dependent 3 mm stone. Normal ureters and urinary bladder Stomach/Bowel: Normal stomach, without wall thickening. The rectum and sigmoid are underdistended, but no dominant sigmoid mass is seen. There is soft tissue thickening in the distal descending colon, including on 61/4, which could represent the site of primary. The descending colon appears thick walled more proximally on 52/4, possibly due to underdistention. Normal terminal ileum and appendix. Normal small bowel.  Vascular/Lymphatic: Aortic atherosclerosis. No abdominopelvic adenopathy. Reproductive: Normal uterus and adnexa. Other: Small volume abdominopelvic ascites. No evidence of omental or peritoneal disease.  Musculoskeletal: Convex right lumbar spine curvature. Advanced lumbosacral spondylosis. IMPRESSION: 1. Wall thickening focally involving the distal descending colon, most likely the site of primary. No dominant sigmoid mass identified. 2. No findings of metastatic disease within the abdomen or pelvis. 3. Bilateral pleural effusions and ascites, possibly representing fluid overload. 4. Hepatic morphology which suggests cirrhosis. Correlate with risk factors. 5. Bilateral nephrolithiasis. 6. Coronary artery atherosclerosis. Aortic Atherosclerosis (ICD10-I70.0). Electronically Signed   By: Abigail Miyamoto M.D.   On: 05/10/2020 14:52        Scheduled Meds: . polyethylene glycol  17 g Oral Daily   Continuous Infusions: . lactated ringers Stopped (05/22/2020 0915)     LOS: 5 days       Hosie Poisson, MD [facility]   To contact the attending provider between 7A-7P or the covering provider during after hours 7P-7A, please log into the web site [email or web address] and access using universal [place] password for that web site. If you do not have the password, please call the hospital operator.  05/05/2020, 2:35 PM

## 2020-05-05 NOTE — Progress Notes (Signed)
2 Days Post-Op  Subjective: No new complaints. Still tolerating clear liquids with no issues.  Sister at bedside today with her.   Got 1 unit of blood yesterday.  ROS: See above, otherwise other systems negative  Objective: Vital signs in last 24 hours: Temp:  [97.5 F (36.4 C)-98.4 F (36.9 C)] 97.8 F (36.6 C) (09/08 1054) Pulse Rate:  [59-81] 66 (09/08 0350) Resp:  [12-18] 12 (09/08 0741) BP: (118-166)/(51-73) 154/69 (09/08 0741) SpO2:  [93 %-100 %] 93 % (09/08 0350) Weight:  [76.5 kg] 76.5 kg (09/08 0500) Last BM Date: 05/04/20 (per patient RN didnt witness)  Intake/Output from previous day: 09/07 0701 - 09/08 0700 In: 514 [Blood:314; IV Piggyback:200] Out: -  Intake/Output this shift: No intake/output data recorded.  PE: Heart: regular, +murmur Lungs: CTAB Abd: soft, NT, poochy still, +BS  Lab Results:  Recent Labs    05/04/20 0120 05/05/20 0034  WBC 6.7 6.2  HGB 7.1* 8.9*  HCT 24.7* 30.4*  PLT 91* 96*   BMET Recent Labs    05/04/20 1427 05/05/20 0034  NA 141 141  K 3.6 3.2*  CL 106 106  CO2 26 28  GLUCOSE 92 84  BUN <5* <5*  CREATININE 0.81 0.84  CALCIUM 8.8* 9.0   PT/INR Recent Labs    05/05/20 0034  LABPROT 15.1  INR 1.2   CMP     Component Value Date/Time   NA 141 05/05/2020 0034   K 3.2 (L) 05/05/2020 0034   CL 106 05/05/2020 0034   CO2 28 05/05/2020 0034   GLUCOSE 84 05/05/2020 0034   BUN <5 (L) 05/05/2020 0034   CREATININE 0.84 05/05/2020 0034   CALCIUM 9.0 05/05/2020 0034   PROT 5.8 (L) 05/04/2020 1427   ALBUMIN 3.0 (L) 05/04/2020 1427   AST 36 05/04/2020 1427   ALT 31 05/04/2020 1427   ALKPHOS 92 05/04/2020 1427   BILITOT 1.4 (H) 05/04/2020 1427   GFRNONAA >60 05/05/2020 0034   GFRAA >60 05/05/2020 0034   Lipase  No results found for: LIPASE     Studies/Results: No results found.  Anti-infectives: Anti-infectives (From admission, onward)   Start     Dose/Rate Route Frequency Ordered Stop   05/05/2020 0600   cefoTEtan (CEFOTAN) 2 g in sodium chloride 0.9 % 100 mL IVPB        2 g 200 mL/hr over 30 Minutes Intravenous On call to O.R. 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 GI offered endoscopy to further evaluate but patient deferred at this time. 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 -CEA is elevated at 11 -given high suspicion for malignancy, elevated CEA, and partially obstructing nature, since she is prepped and transfused, we will proceed with sigmoid colectomy tomorrow. -discussed the procedure in detail with the patient and her sister.  Risks and complication including but not limited to bleeding, infection, hernia, wound infection, anastomotic leak, anastomotic failure, intra-abdominal abscess requiring a drain, need for repeat surgery, colostomy, MI, CVA, and death were discussed with the patient and her sister.  She understands and wishes to proceed. -NPO p MN -will give entereg  tomorrow prior to surgery   FEN - CLD, NPO p MN VTE - on hold due to profound anemia ID - none currently, but cefotetan on call to OR   LOS: 5 days    Henreitta Cea , [facility] Surgery 05/05/2020, 3:34 PM Please see Amion for pager number during day hours 7:00am-4:30pm or 7:00am -11:30am on weekends

## 2020-05-05 NOTE — Plan of Care (Signed)

## 2020-05-06 ENCOUNTER — Other Ambulatory Visit: Payer: Self-pay

## 2020-05-06 ENCOUNTER — Inpatient Hospital Stay (HOSPITAL_COMMUNITY): Payer: Medicare Other | Admitting: Certified Registered Nurse Anesthetist

## 2020-05-06 ENCOUNTER — Encounter (HOSPITAL_COMMUNITY): Payer: Self-pay | Admitting: Internal Medicine

## 2020-05-06 ENCOUNTER — Encounter (HOSPITAL_COMMUNITY): Admission: EM | Disposition: E | Payer: Self-pay | Source: Ambulatory Visit | Attending: Internal Medicine

## 2020-05-06 HISTORY — PX: LIVER BIOPSY: SHX301

## 2020-05-06 HISTORY — PX: PARTIAL COLECTOMY: SHX5273

## 2020-05-06 HISTORY — PX: LAPAROTOMY: SHX154

## 2020-05-06 LAB — CBC
HCT: 34.5 % — ABNORMAL LOW (ref 36.0–46.0)
Hemoglobin: 10 g/dL — ABNORMAL LOW (ref 12.0–15.0)
MCH: 26 pg (ref 26.0–34.0)
MCHC: 29 g/dL — ABNORMAL LOW (ref 30.0–36.0)
MCV: 89.8 fL (ref 80.0–100.0)
Platelets: 102 10*3/uL — ABNORMAL LOW (ref 150–400)
RBC: 3.84 MIL/uL — ABNORMAL LOW (ref 3.87–5.11)
RDW: 25.7 % — ABNORMAL HIGH (ref 11.5–15.5)
WBC: 7.3 10*3/uL (ref 4.0–10.5)
nRBC: 0.3 % — ABNORMAL HIGH (ref 0.0–0.2)

## 2020-05-06 LAB — BASIC METABOLIC PANEL
Anion gap: 8 (ref 5–15)
BUN: <5 mg/dL — ABNORMAL LOW (ref 8–23)
CO2: 30 mmol/L (ref 22–32)
Calcium: 9 mg/dL (ref 8.9–10.3)
Chloride: 103 mmol/L (ref 98–111)
Creatinine, Ser: 0.93 mg/dL (ref 0.44–1.00)
GFR calc Af Amer: >60 mL/min (ref >60)
GFR calc non Af Amer: >60 mL/min (ref >60)
Glucose, Bld: 140 mg/dL — ABNORMAL HIGH (ref 70–99)
Potassium: 3.5 mmol/L (ref 3.5–5.1)
Sodium: 141 mmol/L (ref 135–145)

## 2020-05-06 LAB — COMPREHENSIVE METABOLIC PANEL
ALT: 36 U/L (ref 0–44)
AST: 48 U/L — ABNORMAL HIGH (ref 15–41)
Albumin: 3.1 g/dL — ABNORMAL LOW (ref 3.5–5.0)
Alkaline Phosphatase: 83 U/L (ref 38–126)
Anion gap: 8 (ref 5–15)
BUN: <5 mg/dL — ABNORMAL LOW (ref 8–23)
CO2: 28 mmol/L (ref 22–32)
Calcium: 8.5 mg/dL — ABNORMAL LOW (ref 8.9–10.3)
Chloride: 107 mmol/L (ref 98–111)
Creatinine, Ser: 0.83 mg/dL (ref 0.44–1.00)
GFR calc Af Amer: >60 mL/min (ref >60)
GFR calc non Af Amer: >60 mL/min (ref >60)
Glucose, Bld: 130 mg/dL — ABNORMAL HIGH (ref 70–99)
Potassium: 3.2 mmol/L — ABNORMAL LOW (ref 3.5–5.1)
Sodium: 143 mmol/L (ref 135–145)
Total Bilirubin: 1.7 mg/dL — ABNORMAL HIGH (ref 0.3–1.2)
Total Protein: 5.7 g/dL — ABNORMAL LOW (ref 6.5–8.1)

## 2020-05-06 LAB — TYPE AND SCREEN
ABO/RH(D): O POS
Antibody Screen: NEGATIVE
Unit division: 0
Unit division: 0
Unit division: 0

## 2020-05-06 LAB — BPAM RBC
Blood Product Expiration Date: 202110082359
Blood Product Expiration Date: 202110082359
Blood Product Expiration Date: 202110082359
ISSUE DATE / TIME: 202109071748
ISSUE DATE / TIME: 202109090930
ISSUE DATE / TIME: 202109090930
Unit Type and Rh: 5100
Unit Type and Rh: 5100
Unit Type and Rh: 5100

## 2020-05-06 LAB — CBC WITH DIFFERENTIAL/PLATELET
Abs Immature Granulocytes: 0.02 10*3/uL (ref 0.00–0.07)
Basophils Absolute: 0 10*3/uL (ref 0.0–0.1)
Basophils Relative: 1 %
Eosinophils Absolute: 0.2 10*3/uL (ref 0.0–0.5)
Eosinophils Relative: 3 %
HCT: 32.5 % — ABNORMAL LOW (ref 36.0–46.0)
Hemoglobin: 9.4 g/dL — ABNORMAL LOW (ref 12.0–15.0)
Immature Granulocytes: 0 %
Lymphocytes Relative: 18 %
Lymphs Abs: 1 10*3/uL (ref 0.7–4.0)
MCH: 25.9 pg — ABNORMAL LOW (ref 26.0–34.0)
MCHC: 28.9 g/dL — ABNORMAL LOW (ref 30.0–36.0)
MCV: 89.5 fL (ref 80.0–100.0)
Monocytes Absolute: 0.6 10*3/uL (ref 0.1–1.0)
Monocytes Relative: 12 %
Neutro Abs: 3.6 10*3/uL (ref 1.7–7.7)
Neutrophils Relative %: 66 %
Platelets: 87 10*3/uL — ABNORMAL LOW (ref 150–400)
RBC: 3.63 MIL/uL — ABNORMAL LOW (ref 3.87–5.11)
RDW: 24.9 % — ABNORMAL HIGH (ref 11.5–15.5)
WBC: 5.5 10*3/uL (ref 4.0–10.5)
nRBC: 0.5 % — ABNORMAL HIGH (ref 0.0–0.2)

## 2020-05-06 SURGERY — LAPAROTOMY, EXPLORATORY
Anesthesia: General | Site: Abdomen

## 2020-05-06 MED ORDER — EPHEDRINE SULFATE-NACL 50-0.9 MG/10ML-% IV SOSY
PREFILLED_SYRINGE | INTRAVENOUS | Status: DC | PRN
  Administered 2020-05-06: 10 mg via INTRAVENOUS

## 2020-05-06 MED ORDER — FENTANYL CITRATE (PF) 250 MCG/5ML IJ SOLN
INTRAMUSCULAR | Status: AC
  Filled 2020-05-06: qty 5

## 2020-05-06 MED ORDER — ALVIMOPAN 12 MG PO CAPS
12.0000 mg | ORAL_CAPSULE | Freq: Two times a day (BID) | ORAL | Status: DC
  Administered 2020-05-07 – 2020-05-09 (×5): 12 mg via ORAL
  Filled 2020-05-06 (×5): qty 1

## 2020-05-06 MED ORDER — ACETAMINOPHEN 160 MG/5ML PO SOLN: 325.0000 mg | Freq: Once | ORAL | Status: DC | PRN

## 2020-05-06 MED ORDER — PHENYLEPHRINE 40 MCG/ML (10ML) SYRINGE FOR IV PUSH (FOR BLOOD PRESSURE SUPPORT)
PREFILLED_SYRINGE | INTRAVENOUS | Status: DC | PRN
  Administered 2020-05-06 (×2): 40 ug via INTRAVENOUS

## 2020-05-06 MED ORDER — ONDANSETRON HCL 4 MG/2ML IJ SOLN
INTRAMUSCULAR | Status: AC
  Filled 2020-05-06: qty 2

## 2020-05-06 MED ORDER — LACTATED RINGERS IV SOLN: INTRAVENOUS | Status: DC

## 2020-05-06 MED ORDER — OXYCODONE HCL 5 MG PO TABS
5.0000 mg | ORAL_TABLET | ORAL | Status: DC | PRN
  Administered 2020-05-06: 5 mg via ORAL
  Administered 2020-05-06 – 2020-05-10 (×6): 10 mg via ORAL
  Filled 2020-05-06 (×7): qty 2

## 2020-05-06 MED ORDER — PROPOFOL 10 MG/ML IV BOLUS
INTRAVENOUS | Status: AC
  Filled 2020-05-06: qty 20

## 2020-05-06 MED ORDER — GLYCOPYRROLATE PF 0.2 MG/ML IJ SOSY
PREFILLED_SYRINGE | INTRAMUSCULAR | Status: AC
  Filled 2020-05-06: qty 1

## 2020-05-06 MED ORDER — ONDANSETRON HCL 4 MG/2ML IJ SOLN
INTRAMUSCULAR | Status: DC | PRN
  Administered 2020-05-06: 4 mg via INTRAVENOUS

## 2020-05-06 MED ORDER — ROCURONIUM BROMIDE 100 MG/10ML IV SOLN
INTRAVENOUS | Status: DC | PRN
  Administered 2020-05-06: 50 mg via INTRAVENOUS
  Administered 2020-05-06 (×2): 20 mg via INTRAVENOUS

## 2020-05-06 MED ORDER — METHOCARBAMOL 1000 MG/10ML IJ SOLN
500.0000 mg | Freq: Four times a day (QID) | INTRAVENOUS | Status: DC | PRN
  Filled 2020-05-06 (×2): qty 5

## 2020-05-06 MED ORDER — LIDOCAINE 2% (20 MG/ML) 5 ML SYRINGE
INTRAMUSCULAR | Status: AC
  Filled 2020-05-06: qty 5

## 2020-05-06 MED ORDER — 0.9 % SODIUM CHLORIDE (POUR BTL) OPTIME
TOPICAL | Status: DC | PRN
  Administered 2020-05-06 (×4): 1000 mL

## 2020-05-06 MED ORDER — MEPERIDINE HCL 25 MG/ML IJ SOLN: 6.2500 mg | INTRAMUSCULAR | Status: DC | PRN

## 2020-05-06 MED ORDER — LIDOCAINE 2% (20 MG/ML) 5 ML SYRINGE
INTRAMUSCULAR | Status: DC | PRN
  Administered 2020-05-06: 40 mg via INTRAVENOUS

## 2020-05-06 MED ORDER — PHENYLEPHRINE 40 MCG/ML (10ML) SYRINGE FOR IV PUSH (FOR BLOOD PRESSURE SUPPORT)
PREFILLED_SYRINGE | INTRAVENOUS | Status: AC
  Filled 2020-05-06: qty 10

## 2020-05-06 MED ORDER — ACETAMINOPHEN 325 MG PO TABS: 325.0000 mg | ORAL_TABLET | Freq: Once | ORAL | Status: DC | PRN

## 2020-05-06 MED ORDER — SUGAMMADEX SODIUM 200 MG/2ML IV SOLN
INTRAVENOUS | Status: DC | PRN
  Administered 2020-05-06: 300 mg via INTRAVENOUS
  Administered 2020-05-06: 50 mg via INTRAVENOUS

## 2020-05-06 MED ORDER — CHLORHEXIDINE GLUCONATE 0.12 % MT SOLN
15.0000 mL | Freq: Once | OROMUCOSAL | Status: AC
  Administered 2020-05-06: 15 mL via OROMUCOSAL
  Filled 2020-05-06: qty 15

## 2020-05-06 MED ORDER — DEXAMETHASONE SODIUM PHOSPHATE 10 MG/ML IJ SOLN
INTRAMUSCULAR | Status: AC
  Filled 2020-05-06: qty 1

## 2020-05-06 MED ORDER — FENTANYL CITRATE (PF) 100 MCG/2ML IJ SOLN: 25.0000 ug | INTRAMUSCULAR | Status: DC | PRN

## 2020-05-06 MED ORDER — FENTANYL CITRATE (PF) 250 MCG/5ML IJ SOLN
INTRAMUSCULAR | Status: DC | PRN
Start: 2020-05-06 — End: 2020-05-06
  Administered 2020-05-06 (×5): 50 ug via INTRAVENOUS

## 2020-05-06 MED ORDER — SODIUM CHLORIDE 0.9 % IV SOLN: INTRAVENOUS | Status: DC | PRN

## 2020-05-06 MED ORDER — ALBUMIN HUMAN 5 % IV SOLN: INTRAVENOUS | Status: DC | PRN

## 2020-05-06 MED ORDER — ORAL CARE MOUTH RINSE: 15.0000 mL | Freq: Once | OROMUCOSAL | Status: AC

## 2020-05-06 MED ORDER — ACETAMINOPHEN 10 MG/ML IV SOLN: 1000.0000 mg | Freq: Once | INTRAVENOUS | Status: DC | PRN

## 2020-05-06 MED ORDER — CHLORHEXIDINE GLUCONATE CLOTH 2 % EX PADS
6.0000 | MEDICATED_PAD | Freq: Every day | CUTANEOUS | Status: DC
  Administered 2020-05-07 – 2020-05-26 (×14): 6 via TOPICAL

## 2020-05-06 MED ORDER — AMISULPRIDE (ANTIEMETIC) 5 MG/2ML IV SOLN: 10.0000 mg | Freq: Once | INTRAVENOUS | Status: DC | PRN

## 2020-05-06 MED ORDER — DEXAMETHASONE SODIUM PHOSPHATE 10 MG/ML IJ SOLN
INTRAMUSCULAR | Status: DC | PRN
  Administered 2020-05-06: 10 mg via INTRAVENOUS

## 2020-05-06 MED ORDER — PHENYLEPHRINE HCL-NACL 10-0.9 MG/250ML-% IV SOLN
INTRAVENOUS | Status: DC | PRN
  Administered 2020-05-06: 50 ug/min via INTRAVENOUS

## 2020-05-06 MED ORDER — PROPOFOL 10 MG/ML IV BOLUS
INTRAVENOUS | Status: DC | PRN
  Administered 2020-05-06: 140 mg via INTRAVENOUS

## 2020-05-06 SURGICAL SUPPLY — 51 items
BLADE CLIPPER SURG (BLADE) IMPLANT
CANISTER SUCT 3000ML PPV (MISCELLANEOUS) ×4 IMPLANT
COVER MAYO STAND STRL (DRAPES) ×5 IMPLANT
COVER SURGICAL LIGHT HANDLE (MISCELLANEOUS) ×7 IMPLANT
DRAPE WARM FLUID 44X44 (DRAPES) ×4 IMPLANT
DRSG OPSITE POSTOP 4X10 (GAUZE/BANDAGES/DRESSINGS) ×3 IMPLANT
DRSG TELFA 3X8 NADH (GAUZE/BANDAGES/DRESSINGS) ×4 IMPLANT
ELECT BLADE 6.5 EXT (BLADE) ×4 IMPLANT
ELECT CAUTERY BLADE 6.4 (BLADE) ×5 IMPLANT
ELECT REM PT RETURN 9FT ADLT (ELECTROSURGICAL) ×4
ELECTRODE REM PT RTRN 9FT ADLT (ELECTROSURGICAL) ×2 IMPLANT
GLOVE BIO SURGEON STRL SZ7 (GLOVE) ×8 IMPLANT
GLOVE BIOGEL PI IND STRL 6 (GLOVE) ×2 IMPLANT
GLOVE BIOGEL PI IND STRL 7.5 (GLOVE) ×5 IMPLANT
GLOVE BIOGEL PI INDICATOR 6 (GLOVE) ×4
GLOVE BIOGEL PI INDICATOR 7.5 (GLOVE) ×6
GOWN STRL REUS W/ TWL LRG LVL3 (GOWN DISPOSABLE) ×12 IMPLANT
GOWN STRL REUS W/TWL LRG LVL3 (GOWN DISPOSABLE) ×24
KIT BASIN OR (CUSTOM PROCEDURE TRAY) ×4 IMPLANT
KIT SIGMOIDOSCOPE (SET/KITS/TRAYS/PACK) IMPLANT
KIT TURNOVER KIT B (KITS) ×4 IMPLANT
LIGASURE IMPACT 36 18CM CVD LR (INSTRUMENTS) ×3 IMPLANT
NS IRRIG 1000ML POUR BTL (IV SOLUTION) ×8 IMPLANT
PACK COLON (CUSTOM PROCEDURE TRAY) ×3 IMPLANT
PAD ARMBOARD 7.5X6 YLW CONV (MISCELLANEOUS) ×8 IMPLANT
PAD DRESSING TELFA 3X8 NADH (GAUZE/BANDAGES/DRESSINGS) IMPLANT
PENCIL SMOKE EVACUATOR (MISCELLANEOUS) ×4 IMPLANT
RELOAD PROXIMATE 75MM BLUE (ENDOMECHANICALS) ×12 IMPLANT
RELOAD STAPLE 75 3.8 BLU REG (ENDOMECHANICALS) IMPLANT
SPONGE LAP 18X18 RF (DISPOSABLE) ×6 IMPLANT
STAPLER GUN LINEAR PROX 60 (STAPLE) ×3 IMPLANT
STAPLER PROXIMATE 75MM BLUE (STAPLE) ×3 IMPLANT
STAPLER VISISTAT 35W (STAPLE) ×4 IMPLANT
SURGILUBE 2OZ TUBE FLIPTOP (MISCELLANEOUS) IMPLANT
SUT NOVA NAB DX-16 0-1 5-0 T12 (SUTURE) ×6 IMPLANT
SUT PDS AB 1 TP1 96 (SUTURE) ×8 IMPLANT
SUT PROLENE 2 0 CT2 30 (SUTURE) IMPLANT
SUT PROLENE 2 0 KS (SUTURE) IMPLANT
SUT SILK 2 0 SH CR/8 (SUTURE) ×4 IMPLANT
SUT SILK 2 0 TIES 10X30 (SUTURE) ×4 IMPLANT
SUT SILK 3 0 SH CR/8 (SUTURE) ×7 IMPLANT
SUT SILK 3 0 TIES 10X30 (SUTURE) ×4 IMPLANT
SUT VIC AB 3-0 SH 18 (SUTURE) IMPLANT
SUT VIC AB 3-0 SH 27 (SUTURE)
SUT VIC AB 3-0 SH 27X BRD (SUTURE) IMPLANT
SYR BULB IRRIG 60ML STRL (SYRINGE) ×7 IMPLANT
TOWEL GREEN STERILE (TOWEL DISPOSABLE) ×5 IMPLANT
TRAY FOLEY MTR SLVR 14FR STAT (SET/KITS/TRAYS/PACK) ×4 IMPLANT
TUBE CONNECTING 12'X1/4 (SUCTIONS) ×1
TUBE CONNECTING 12X1/4 (SUCTIONS) ×3 IMPLANT
UNDERPAD 30X36 HEAVY ABSORB (UNDERPADS AND DIAPERS) IMPLANT

## 2020-05-06 NOTE — Plan of Care (Signed)

## 2020-05-06 NOTE — Op Note (Signed)
Preop diagnosis: Left colon cancer, hepatic cirrhosis Postop diagnosis: Same Procedure performed: Left hemicolectomy, mobilization of the splenic flexure, liver biopsy Surgeon:Pharell Rolfson K Renarda Mullinix Assistant: Dr. Michaelle Birks Anesthesia: General Indications: This is a 74 year old female with cirrhosis who presented with severe anemia.  Work-up revealed a near circumferential left colon mass.  The initial biopsies tubulovillous adenoma with a focus of suspicion for invasive cancer.  However the gross appearance of the mass along with the fact that her CEA level was elevated at 11 makes it highly suspicious for a left colon cancer.  We discussed this with the patient and recommended left hemicolectomy.  Due to her cirrhosis, the patient is thrombocytopenic.  We are transfusing platelets during the surgery.  Description of procedure: The patient is brought to the operating room and placed in the supine position on the operating room table.  After an adequate level of general anesthesia was obtained, her legs were placed in lithotomy position in yellowfin stirrups to facilitate a possible low anastomosis.  A Foley catheter was placed under sterile technique.  The patient's abdomen was prepped with ChloraPrep and her perineum was prepped with Betadine.  We draped in sterile fashion.  A timeout was taken to ensure the proper patient and proper procedure.  We made a vertical midline incision below the umbilicus.  Dissection was carried down to the fascia.  We open the linea alba and entered the peritoneal cavity.  There is no sign of peritoneal metastases.  Minimal ascites was encountered.  The liver is palpated and is fairly firm and nodular consistent with cirrhosis.  The left colon mass is proximal in the left colon and is easily identified by the endoscopic tattoo as well as the easily palpable mass.  There is no sign of invasion into the surrounding structures by the tumor.  We mobilized the left colon by  dividing the white line of Toldt with cautery.  We took down the splenic flexure.  This allowed easy mobilization of the left colon.  We divided the colon 15 cm proximal and distal to the palpable tumor with a GIA 75..  We divided the mesentery with the LigaSure device.  There are several palpable lymph nodes within the mesentery that were included within the specimen.  The specimen was sent as left colon.  The proximal stump of the distal transverse colon showed a large serosal tear.  We decided not to leave this or repaired it.  We divided the mesentery with the LigaSure further.  We then fired 1 more load of the GIA 75 stapler and the distal transverse colon.  This additional specimen was sent as splenic flexure.  We inspected for hemostasis.  Several bleeding areas in the mesentery were oversewn with 3-0 silk sutures.  We created a side-to-side anastomosis of the transverse colon to the distal sigmoid colon with a GIA 75 and a TA 60 stapler.  The internal staple line was inspected.  There was a bleeding point that was ligated with 3-0 silk suture.  The crotch of the anastomosis was reinforced with the 3-0 silk suture.  The mesenteric defect was closed with 2-0 silk sutures.  We irrigated the abdomen thoroughly and inspected for hemostasis.  The anastomosis is widely patent.  No bleeding was noted.  We then placed retractors and were able to identify the edge of the liver.  A small wedge of the edge of the liver was taken as an hepatic biopsy.  This was made hemostatic with cautery.  We again  irrigated thoroughly and inspected for hemostasis.  We changed our gowns and gloves according to the colorectal protocol.  The fascia was reapproximated with double-stranded #1 PDS suture as well as several interspersed 0 Novafil sutures.  The subcutaneous tissues were irrigated.  Staples were used to close the skin.  A honeycomb dressing is applied.  The patient is then extubated and brought to the recovery room in  stable condition.  All sponge, instrument, and needle counts are correct.  Imogene Burn. Georgette Dover, MD, [facility] Surgery  General/ Trauma Surgery   05/05/2020 12:14 PM

## 2020-05-06 NOTE — Anesthesia Preprocedure Evaluation (Signed)
Anesthesia Evaluation  Patient identified by MRN, date of birth, ID band Patient awake    Reviewed: Allergy & Precautions, NPO status , Patient's Chart, lab work & pertinent test results  History of Anesthesia Complications (+) PONV and history of anesthetic complications  Airway Mallampati: I  TM Distance: >3 FB Neck ROM: Full    Dental  (+) Chipped,    Pulmonary    Pulmonary exam normal        Cardiovascular hypertension, + dysrhythmias  Rhythm:Regular Rate:Normal     Neuro/Psych PSYCHIATRIC DISORDERS Anxiety negative neurological ROS     GI/Hepatic negative GI ROS, Neg liver ROS,   Endo/Other  negative endocrine ROS  Renal/GU Renal disease     Musculoskeletal  (+) Arthritis ,   Abdominal   Peds  Hematology   Anesthesia Other Findings   Reproductive/Obstetrics                            Anesthesia Physical Anesthesia Plan  ASA: III  Anesthesia Plan: General   Post-op Pain Management:    Induction: Intravenous  PONV Risk Score and Plan: 4 or greater and Ondansetron, Dexamethasone and Treatment may vary due to age or medical condition  Airway Management Planned: Oral ETT  Additional Equipment: Arterial line  Intra-op Plan:   Post-operative Plan: Extubation in OR  Informed Consent: I have reviewed the patients History and Physical, chart, labs and discussed the procedure including the risks, benefits and alternatives for the proposed anesthesia with the patient or authorized representative who has indicated his/her understanding and acceptance.     Dental advisory given  Plan Discussed with: CRNA  Anesthesia Plan Comments: (Transfuse plts at beginning case.  )       Anesthesia Quick Evaluation

## 2020-05-06 NOTE — Progress Notes (Signed)
PROGRESS NOTE    Regina Richards  EGB:[id number] DOB: [birth year] DOA: 05/12/2020 PCP: Patient, No Pcp Per    Chief Complaint  Patient presents with  . Loss of Consciousness    Brief Narrative:   74 year old lady with prior history of essential hypertension, anxiety, anemia brought in for exertional chest pain secondary to severe anemia.  Hemoglobin was 2.9 on admission currently around 8.  FOBT was negative in the ED.  GI was consulted patient underwent colonoscopy showing an obstructing sigmoid colon mass which was biopsied and results are pending.  Oncology and general surgery consulted for further recommendations. The biopsy results show Tubulovillous adenoma, focally suspicious for invasive carcinoma. Pt is scheduled for  Sigmoid colectomy today.    Assessment & Plan:   Principal Problem:   Symptomatic anemia Active Problems:   Essential hypertension, benign   Pancytopenia (HCC)   History of kidney stones   CKD (chronic kidney disease), stage III   Syncope and collapse   Bradycardia   Thrombocytopenia (HCC)   Iron deficiency anemia   Benign neoplasm of cecum   Benign neoplasm of transverse colon   Benign neoplasm of descending colon   Benign neoplasm of rectum   Colonic mass     Severe iron deficiency anemia probably secondary to sigmoid mass.  Patient was admitted with a hemoglobin of 2.9, s/p 3 units of PRBC transfusion and hemoglobin stable between 7-8.  Patient also underwent 1 dose of IV iron on 05/01/2020 and a repeat dose of Feraheme scheduled to be given on 05/08/2020. Hemoglobin around 9.4 today.    Sigmoid colon mass Seen at the time of colonoscopy by GI, biopsied and results are pending.  General surgery consulted for evaluation of resection/sigmoidectomy.  Path results show Tubulovillous adenoma, focally suspicious for invasive carcinoma. Pt is scheduled for  Sigmoid colectomy today.    Mild to moderate thrombocytopenia Unclear etiology  no evidence of  bleeding hematology on board. Platelets at 87000 today.    Hypokalemia;  Replaced. Repeat potassium wnl.   Enterococcus UTI:  ON amoxicillin.      DVT prophylaxis: scd's Code Status: FULL CODE.  Family Communication: none at bedside.  Disposition:   Status is: Inpatient  Remains inpatient appropriate because:Ongoing diagnostic testing needed not appropriate for outpatient work up   Dispo: The patient is from: Home              Anticipated d/c is to: Home              Anticipated d/c date is: 2 days              Patient currently is not medically stable to d/c.       Consultants:   Gastroenterology Hematology General surgery   Procedures: Colonoscopy   Antimicrobials: None.    Subjective: No chest pain or sob.  No nausea, vomiting or abd pain.   Objective: Vitals:   05/05/20 2348 05/16/2020 0340 04/30/2020 0604 05/18/2020 0800  BP: (!) 161/65 (!) 147/67  (!) 150/77  Pulse: 60 (!) 50  (!) 55  Resp: 15 14  20   Temp: 98.5 F (36.9 C) 97.9 F (36.6 C)  (!) 97.5 F (36.4 C)  TempSrc: Oral Oral  Oral  SpO2: 99% 95%  93%  Weight:   73.2 kg   Height:        Intake/Output Summary (Last 24 hours) at 05/05/2020 1018 Last data filed at 05/05/2020 2312 Gross per 24 hour  Intake 240 ml  Output --  Net 240 ml   Filed Weights   05/04/20 0413 05/05/20 0500 05/23/2020 0604  Weight: 76.3 kg 76.5 kg 73.2 kg    Examination:  General exam:alert and comfortable.  Respiratory system: clear to auscultation, no wheezing or rhonchi.  Cardiovascular system: S1S2 heard, RRR no JVD, no pedal edema.  Gastrointestinal system: Abdomen is soft, non tender bowel sounds wnl.  Central nervous system: alert and oriented, non focal  Extremities: no cyanosis or clubbing.  Skin: No rashes seen.  Psychiatry:  Mood is appropriate.     Data Reviewed: I have personally reviewed following labs and imaging studies  CBC: Recent Labs  Lab 05/05/2020 1344 05/07/2020 1344 05/11/2020 1800  05/10/2020 1800 05/01/20 1010 05/01/20 1010 05/02/20 0234 05/05/2020 0209 05/04/20 0120 05/05/20 0034 05/03/2020 0025  WBC 3.9*   < > 3.6*   < > 5.7   < > 5.5 6.0 6.7 6.2 5.5  NEUTROABS 3.3  --  3.0  --  4.4  --   --   --   --   --  3.6  HGB 2.9*   < > 2.7*   < > 7.4*   < > 7.2* 7.7* 7.1* 8.9* 9.4*  HCT 12.3*   < > 11.9*   < > 25.3*   < > 24.0* 26.2* 24.7* 30.4* 32.5*  MCV 75.0*   < > 73.0*   < > 81.6   < > 83.0 84.2 83.4 87.1 89.5  PLT 98*   < > 80*   < > 91*   < > 84* 86* 91* 96* 87*   < > = values in this interval not displayed.    Basic Metabolic Panel: Recent Labs  Lab 05/01/2020 1944 05/01/20 1010 05/17/2020 0209 05/04/20 0120 05/04/20 1427 05/05/20 0034 05/01/2020 0025  NA  --    < > 143 140 141 141 141  K  --    < > 3.4* 3.2* 3.6 3.2* 3.5  CL  --    < > 111 105 106 106 103  CO2  --    < > 24 27 26 28 30   GLUCOSE  --    < > 89 83 92 84 140*  BUN  --    < > 7* <5* <5* <5* <5*  CREATININE  --    < > 0.79 0.75 0.81 0.84 0.93  CALCIUM  --    < > 8.5* 8.7* 8.8* 9.0 9.0  MG 2.0  --   --   --   --   --   --   PHOS 3.8  --   --   --   --   --   --    < > = values in this interval not displayed.    GFR: Estimated Creatinine Clearance: 52.8 mL/min (by C-G formula based on SCr of 0.93 mg/dL).  Liver Function Tests: Recent Labs  Lab 05/01/20 1010 05/02/20 0234 05/04/20 1427  AST 38 38 36  ALT 29 30 31   ALKPHOS 79 78 92  BILITOT 3.1* 2.1* 1.4*  PROT 6.2* 5.7* 5.8*  ALBUMIN 3.3* 2.9* 3.0*    CBG: Recent Labs  Lab 05/10/2020 1333  GLUCAP 100*     Recent Results (from the past 240 hour(s))  SARS Coronavirus 2 by RT PCR (hospital order, performed in [facility] hospital lab) Nasopharyngeal Nasopharyngeal Swab     Status: None   Collection Time: 04/29/2020  4:44 PM   Specimen: Nasopharyngeal Swab  Result Value  Ref Range Status   SARS Coronavirus 2 NEGATIVE NEGATIVE Final    Comment: (NOTE) SARS-CoV-2 target nucleic acids are NOT DETECTED.  The SARS-CoV-2 RNA is  generally detectable in upper and lower respiratory specimens during the acute phase of infection. The lowest concentration of SARS-CoV-2 viral copies this assay can detect is 250 copies / mL. A negative result does not preclude SARS-CoV-2 infection and should not be used as the sole basis for treatment or other patient management decisions.  A negative result may occur with improper specimen collection / handling, submission of specimen other than nasopharyngeal swab, presence of viral mutation(s) within the areas targeted by this assay, and inadequate number of viral copies (<250 copies / mL). A negative result must be combined with clinical observations, patient history, and epidemiological information.  Fact Sheet for Patients:   StrictlyIdeas.no  Fact Sheet for Healthcare Providers: BankingDealers.co.za  This test is not yet approved or  cleared by the [place] FDA and has been authorized for detection and/or diagnosis of SARS-CoV-2 by FDA under an Emergency Use Authorization (EUA).  This EUA will remain in effect (meaning this test can be used) for the duration of the COVID-19 declaration under Section 564(b)(1) of the Act, 21 U.S.C. section 360bbb-3(b)(1), unless the authorization is terminated or revoked sooner.  Performed at [facility], [place] [address], [place], [place] [address]   MRSA PCR Screening     Status: None   Collection Time: 05/01/20 12:01 AM   Specimen: Nasopharyngeal  Result Value Ref Range Status   MRSA by PCR NEGATIVE NEGATIVE Final    Comment:        The GeneXpert MRSA Assay (FDA approved for NASAL specimens only), is one component of a comprehensive MRSA colonization surveillance program. It is not intended to diagnose MRSA infection nor to guide or monitor treatment for MRSA infections. Performed at [facility], [place] [address] [place], [place] [address]   Culture, Urine      Status: Abnormal   Collection Time: 05/02/20 10:15 AM   Specimen: Urine, Random  Result Value Ref Range Status   Specimen Description URINE, RANDOM  Final   Special Requests   Final    NONE Performed at [facility], [place] [address], [place],  [address]    Culture ENTEROCOCCUS FAECALIS (A)  Final   Report Status 05/04/2020 FINAL  Final   Organism ID, Bacteria ENTEROCOCCUS FAECALIS  Final      Susceptibility   Enterococcus faecalis - MIC*    AMPICILLIN <=2 SENSITIVE Sensitive     NITROFURANTOIN <=16 SENSITIVE Sensitive     VANCOMYCIN 2 SENSITIVE Sensitive     * ENTEROCOCCUS FAECALIS         Radiology Studies: No results found.      Scheduled Meds: . [MAR Hold] sodium chloride   Intravenous Once  . [MAR Hold] amoxicillin  500 mg Oral Q12H  . bupivacaine liposome  20 mL Infiltration Once  . [MAR Hold] polyethylene glycol  17 g Oral Daily   Continuous Infusions: . cefoTEtan (CEFOTAN) IV    . lactated ringers 10 mL/hr at 05/14/2020 0928  . lactated ringers 10 mL/hr at 05/12/2020 0940     LOS: 6 days       Hosie Poisson, MD [facility]   To contact the attending provider between 7A-7P or the covering provider during after hours 7P-7A, please log into the web site [email or web address] and access using universal [place] password for  that web site. If you do not have the password, please call the hospital operator.  04/28/2020, 10:18 AM

## 2020-05-06 NOTE — Progress Notes (Signed)
PT Cancellation Note  Patient Details Name: Regina Richards MRN: [id number] DOB: 08-Feb-1946   Cancelled Treatment:    Reason Eval/Treat Not Completed: Patient at procedure or test/unavailable   Michael Walrath B Macio Kissoon 05/24/2020, 9:10 AM  Bayard Males, PT Acute Rehabilitation Services Pager: [phone] Office: [phone]

## 2020-05-06 NOTE — Anesthesia Procedure Notes (Signed)
Arterial Line Insertion Start/End09/28/2021 10:18 AM, 05/06/2020 10:22 AM Performed by: Darral Dash, DO, attending  Patient location: OR. Preanesthetic checklist: patient identified, IV checked, site marked, risks and benefits discussed, surgical consent, monitors and equipment checked, pre-op evaluation, timeout performed and anesthesia consent Left, radial was placed Catheter size: 20 G Hand hygiene performed  Allen's test indicative of satisfactory collateral circulation Attempts: 1 Procedure performed without using ultrasound guided technique. Ultrasound Notes:no ultrasound evidence of intravascular and/or intraneural injection Following insertion, Biopatch and dressing applied. Post procedure assessment: unchanged  Patient tolerated the procedure well with no immediate complications.

## 2020-05-06 NOTE — Anesthesia Procedure Notes (Signed)
Procedure Name: Intubation Date/Time: 05/26/2020 10:20 AM Performed by: Janene Harvey, CRNA Pre-anesthesia Checklist: Patient identified, Emergency Drugs available, Suction available and Patient being monitored Patient Re-evaluated:Patient Re-evaluated prior to induction Oxygen Delivery Method: Circle system utilized Preoxygenation: Pre-oxygenation with 100% oxygen Induction Type: IV induction Ventilation: Mask ventilation without difficulty Laryngoscope Size: Mac and 4 Grade View: Grade II Tube type: Oral Tube size: 7.0 mm Number of attempts: 1 Airway Equipment and Method: Stylet and Oral airway Placement Confirmation: ETT inserted through vocal cords under direct vision,  positive ETCO2 and breath sounds checked- equal and bilateral Secured at: 22 cm Tube secured with: Tape Dental Injury: Teeth and Oropharynx as per pre-operative assessment

## 2020-05-06 NOTE — Anesthesia Postprocedure Evaluation (Signed)
Anesthesia Post Note  Patient: Regina Richards  Procedure(s) Performed: EXPLORATORY LAPAROTOMY (N/A Abdomen) PARTIAL COLECTOMY (N/A Abdomen) LIVER BIOPSY (N/A Abdomen)     Patient location during evaluation: PACU Anesthesia Type: General Level of consciousness: awake and alert Pain management: pain level controlled Vital Signs Assessment: post-procedure vital signs reviewed and stable Respiratory status: spontaneous breathing, nonlabored ventilation, respiratory function stable and patient connected to nasal cannula oxygen Cardiovascular status: blood pressure returned to baseline and stable Postop Assessment: no apparent nausea or vomiting Anesthetic complications: no   No complications documented.  Last Vitals:  Vitals:   05/14/2020 1310 05/26/2020 1315  BP: (!) 128/56   Pulse: 66 68  Resp: 15 18  Temp: (!) 36.2 C   SpO2: 91% 98%    Last Pain:  Vitals:   05/11/2020 1310  TempSrc:   PainSc: Ball Amanie Mcculley

## 2020-05-06 NOTE — Progress Notes (Signed)
3 Days Post-Op   Subjective/Chief Complaint: No complaints No BM  Objective: Vital signs in last 24 hours: Temp:  [97.8 F (36.6 C)-98.5 F (36.9 C)] 97.9 F (36.6 C) (09/09 0340) Pulse Rate:  [50-60] 50 (09/09 0340) Resp:  [14-17] 14 (09/09 0340) BP: (147-161)/(65-74) 147/67 (09/09 0340) SpO2:  [94 %-99 %] 95 % (09/09 0340) Weight:  [73.2 kg] 73.2 kg (09/09 0604) Last BM Date: 05/04/20  Intake/Output from previous day: 09/08 0701 - 09/09 0700 In: 240 [P.O.:240] Out: -  Intake/Output this shift: No intake/output data recorded.  GI: Abdomen protuberant, soft, non-tender  Lab Results:  Recent Labs    05/05/20 0034 05/19/2020 0025  WBC 6.2 5.5  HGB 8.9* 9.4*  HCT 30.4* 32.5*  PLT 96* 87*   BMET Recent Labs    05/05/20 0034 05/11/2020 0025  NA 141 141  K 3.2* 3.5  CL 106 103  CO2 28 30  GLUCOSE 84 140*  BUN <5* <5*  CREATININE 0.84 0.93  CALCIUM 9.0 9.0   PT/INR Recent Labs    05/05/20 0034  LABPROT 15.1  INR 1.2   ABG No results for input(s): PHART, HCO3 in the last 72 hours.  Invalid input(s): PCO2, PO2  Studies/Results: No results found.  Anti-infectives: Anti-infectives (From admission, onward)   Start     Dose/Rate Route Frequency Ordered Stop   05/22/2020 0600  cefoTEtan (CEFOTAN) 2 g in sodium chloride 0.9 % 100 mL IVPB        2 g 200 mL/hr over 30 Minutes Intravenous On call to O.R. 05/05/20 1533 05/07/20 0559   05/05/20 1600  amoxicillin (AMOXIL) capsule 500 mg        500 mg Oral Every 12 hours 05/05/20 1447 05/10/20 2159      Assessment/Plan: Anxiety Cirrhosis - Child's Pugh B based off of current labs. GI offered endoscopy to further evaluate but patient deferred at this time. Anemia - hgb up to 9.4, platelets 87 - transfuse during surgery  Colon mass -pathology of other polyps are benign -mass is tubulovillous adenoma, focally suspicious for invasive carcinoma. -CEA is elevated at 11 -given high suspicion for malignancy,  elevated CEA, and partially obstructing nature, since she is prepped and transfused, we will proceed with sigmoid colectomy today -discussed the procedure in detail with the patient and her sister.  Risks and complication including but not limited to bleeding, infection, hernia, wound infection, anastomotic leak, anastomotic failure, intra-abdominal abscess requiring a drain, need for repeat surgery, colostomy, MI, CVA, and death were discussed with the patient and her sister.  She understands and wishes to proceed. -NPO p MN -Entereg   FEN -CLD, NPO p MN VTE -on hold due to profound anemia ID -none currently, but cefotetan on call to OR  LOS: 6 days    [name] 05/26/2020

## 2020-05-06 NOTE — Transfer of Care (Signed)
Immediate Anesthesia Transfer of Care Note  Patient: Regina Richards  Procedure(s) Performed: EXPLORATORY LAPAROTOMY (N/A Abdomen) PARTIAL COLECTOMY (N/A Abdomen) LIVER BIOPSY (N/A Abdomen)  Patient Location: PACU  Anesthesia Type:General  Level of Consciousness: drowsy  Airway & Oxygen Therapy: Patient Spontanous Breathing and Patient connected to face mask oxygen  Post-op Assessment: Report given to RN and Post -op Vital signs reviewed and stable  Post vital signs: Reviewed  Last Vitals:  Vitals Value Taken Time  BP 143/64 05/16/2020 1242  Temp 36.2 C 05/17/2020 1240  Pulse 60 05/18/2020 1243  Resp 21 05/09/2020 1243  SpO2 100 % 05/08/2020 1243  Vitals shown include unvalidated device data.  Last Pain:  Vitals:   05/24/2020 0800  TempSrc: Oral  PainSc:          Complications: No complications documented.

## 2020-05-07 ENCOUNTER — Encounter (HOSPITAL_COMMUNITY): Payer: Self-pay | Admitting: Surgery

## 2020-05-07 LAB — COMPREHENSIVE METABOLIC PANEL
ALT: 37 U/L (ref 0–44)
AST: 44 U/L — ABNORMAL HIGH (ref 15–41)
Albumin: 2.9 g/dL — ABNORMAL LOW (ref 3.5–5.0)
Alkaline Phosphatase: 77 U/L (ref 38–126)
Anion gap: 9 (ref 5–15)
BUN: 8 mg/dL (ref 8–23)
CO2: 28 mmol/L (ref 22–32)
Calcium: 8.8 mg/dL — ABNORMAL LOW (ref 8.9–10.3)
Chloride: 106 mmol/L (ref 98–111)
Creatinine, Ser: 1.25 mg/dL — ABNORMAL HIGH (ref 0.44–1.00)
GFR calc Af Amer: 49 mL/min — ABNORMAL LOW (ref >60)
GFR calc non Af Amer: 43 mL/min — ABNORMAL LOW (ref >60)
Glucose, Bld: 121 mg/dL — ABNORMAL HIGH (ref 70–99)
Potassium: 3.7 mmol/L (ref 3.5–5.1)
Sodium: 143 mmol/L (ref 135–145)
Total Bilirubin: 1.4 mg/dL — ABNORMAL HIGH (ref 0.3–1.2)
Total Protein: 5.5 g/dL — ABNORMAL LOW (ref 6.5–8.1)

## 2020-05-07 LAB — CBC
HCT: 34 % — ABNORMAL LOW (ref 36.0–46.0)
Hemoglobin: 9.8 g/dL — ABNORMAL LOW (ref 12.0–15.0)
MCH: 26.7 pg (ref 26.0–34.0)
MCHC: 28.8 g/dL — ABNORMAL LOW (ref 30.0–36.0)
MCV: 92.6 fL (ref 80.0–100.0)
Platelets: 110 10*3/uL — ABNORMAL LOW (ref 150–400)
RBC: 3.67 MIL/uL — ABNORMAL LOW (ref 3.87–5.11)
RDW: 26.9 % — ABNORMAL HIGH (ref 11.5–15.5)
WBC: 14.8 10*3/uL — ABNORMAL HIGH (ref 4.0–10.5)
nRBC: 0.1 % (ref 0.0–0.2)

## 2020-05-07 LAB — BPAM PLATELET PHERESIS
Blood Product Expiration Date: 202109112359
Blood Product Expiration Date: 202109112359
ISSUE DATE / TIME: 202109090928
ISSUE DATE / TIME: 202109090928
Unit Type and Rh: 5100
Unit Type and Rh: 7300

## 2020-05-07 LAB — PREPARE PLATELET PHERESIS
Unit division: 0
Unit division: 0

## 2020-05-07 LAB — AMMONIA: Ammonia: 30 umol/L (ref 9–35)

## 2020-05-07 MED ORDER — BOOST / RESOURCE BREEZE PO LIQD CUSTOM
1.0000 | Freq: Three times a day (TID) | ORAL | Status: DC
  Administered 2020-05-07 – 2020-05-09 (×5): 1 via ORAL

## 2020-05-07 MED ORDER — METHOCARBAMOL 500 MG PO TABS
500.0000 mg | ORAL_TABLET | Freq: Three times a day (TID) | ORAL | Status: DC
  Administered 2020-05-07 – 2020-05-12 (×18): 500 mg via ORAL
  Filled 2020-05-07 (×18): qty 1

## 2020-05-07 MED ORDER — PROSOURCE PLUS PO LIQD
30.0000 mL | Freq: Two times a day (BID) | ORAL | Status: DC
  Administered 2020-05-07 – 2020-05-17 (×19): 30 mL via ORAL
  Filled 2020-05-07 (×19): qty 30

## 2020-05-07 NOTE — Progress Notes (Signed)
Initial Nutrition Assessment  RD working remotely.  DOCUMENTATION CODES:   Not applicable  INTERVENTION:   - Boost Breeze po TID, each supplement provides 250 kcal and 9 grams of protein  - ProSource Plus 30 ml po BID, each supplement provides 100 kcal and 15 grams of protein  - RD will follow for diet advancement and adjust supplement regimen as appropriate  NUTRITION DIAGNOSIS:   Inadequate oral intake related to altered GI function as evidenced by other (clear liquid diet order).  GOAL:   Patient will meet greater than or equal to 90% of their needs  MONITOR:   PO intake, Supplement acceptance, Diet advancement, Weight trends, Labs, Skin  REASON FOR ASSESSMENT:   NPO/Clear Liquid Diet x 5 days    ASSESSMENT:   74 year old female who presented on 9/03 with syncopal episode and SOB. PMH of anxiety, HTN, nephrolithiasis. Pt admitted with severe iron deficiency anemia.   9/04 - full liquid diet 9/05 - clear liquid diet 9/06 - s/p colonoscopy with findings of partially obstructing sigmoid colon mass and multiple colon polyps, clear liquid diet 9/09 - s/p left hemicolectomy with mobilization of the splenic flexure and liver biopsy, NPO then clear liquid diet later  Biopsy results of partially obstructing sigmoid colon mass show tubulovillous adenoma, focally suspicious for invasive carcinoma. Pt fond to have cirrhosis and liver biopsy results pending.  Per Surgery note, no bowel function yet and plan is to continue clear liquid diet.  Attempted to speak with pt via phone call to room x 2. On first attempt, someone answered phone for pt and pt requested RD call back as she had 2 visitors in her room at the time. RD called back later but no answer. Will attempt to obtain more detailed diet and weight history at follow-up.  Per review of notes, pt typically eats 2 meals daily (breakfast and supper) and does eat red meat. Pt also reports consuming at least 2 beers  daily.  Reviewed weight history in chart. Pt with an 11 lb weight loss since admission on 9/03. This is a 6.8% weight loss in 1 week which is significant for timeframe. Suspect pt with some degree of acute malnutrition but unable to confirm at this time.  RD will order clear liquid oral nutrition supplements to aid pt in meeting kcal and protein needs. Will monitor for diet advancement and adjust supplement regimen as appropriate.  Meal Completion: 75-100% (mostly clear liquid meal trays)  Medications reviewed and include: miralax IVF: LR @ 50 ml/hr  Labs reviewed: creatinine 1.25, hemoglobin 9.8  UOP: 1900 ml x 24 hours I/O's: +3.9 L since admit  NUTRITION - FOCUSED PHYSICAL EXAM:  Unable to complete at this time. RD working remotely.  Diet Order:   Diet Order            Diet clear liquid Room service appropriate? Yes; Fluid consistency: Thin  Diet effective now                 EDUCATION NEEDS:   No education needs have been identified at this time  Skin:  Skin Assessment: Skin Integrity Issues: Incisions: closed abdomen  Last BM:  05/04/20 type 7  Height:   Ht Readings from Last 1 Encounters:  05/01/2020 5\' 4"  (1.626 m)    Weight:   Wt Readings from Last 1 Encounters:  05/07/20 73.5 kg    Ideal Body Weight:  54.5 kg  BMI:  Body mass index is 27.81 kg/m.  Estimated Nutritional Needs:  Kcal:  1800-2000  Protein:  85-100 grams  Fluid:  1.8-2.0 L    Gaynell Face, MS, RD, LDN Inpatient Clinical Dietitian Please see AMiON for contact information.

## 2020-05-07 NOTE — Plan of Care (Signed)

## 2020-05-07 NOTE — Progress Notes (Signed)
[facility] Progress Note  1 Day Post-Op  Subjective: Patient reports she is slightly sore but overall not having much pain. She is tolerating CLD without nausea. No flatus or BM yet. Sitting up in chair this AM.   Objective: Vital signs in last 24 hours: Temp:  [97.1 F (36.2 C)-98.5 F (36.9 C)] 98.5 F (36.9 C) (09/10 0739) Pulse Rate:  [55-89] 76 (09/10 0739) Resp:  [13-20] 17 (09/10 0739) BP: (117-150)/(52-77) 120/56 (09/10 0739) SpO2:  [91 %-99 %] 95 % (09/10 0739) Arterial Line BP: (149-163)/(45-58) 155/57 (09/10 0349) Weight:  [73.5 kg] 73.5 kg (09/10 0500) Last BM Date: 05/04/20  Intake/Output from previous day: 09/09 0701 - 09/10 0700 In: 3628.3 [P.O.:240; I.V.:2286.3; Blood:752; IV Piggyback:350] Out: 1950 [Urine:1900; Blood:50] Intake/Output this shift: No intake/output data recorded.  PE: General: pleasant, WD, overweight female who is sitting up in NAD HEENT:  Sclera are anicteric.  PERRL.  Ears and nose without any masses or lesions.  Mouth is pink and moist Heart: regular, rate, and rhythm.  Normal s1,s2. No obvious murmurs, gallops, or rubs noted.  Palpable radial and pedal pulses bilaterally Lungs: CTAB, no wheezes, rhonchi, or rales noted.  Respiratory effort nonlabored Abd: soft, appropriately ttp, mildly distended, honeycomb dressing in place with some erythema to left medial incision MS: all 4 extremities are symmetrical with no cyanosis, clubbing, or edema. Skin: warm and dry with no masses, lesions, or rashes Neuro: Cranial nerves 2-12 grossly intact, sensation grossly intact throughout Psych: A&Ox3 with an appropriate affect.   Lab Results:  Recent Labs    05/21/2020 1620 05/07/20 0400  WBC 7.3 14.8*  HGB 10.0* 9.8*  HCT 34.5* 34.0*  PLT 102* 110*   BMET Recent Labs    05/07/2020 1620 05/07/20 0400  NA 143 143  K 3.2* 3.7  CL 107 106  CO2 28 28  GLUCOSE 130* 121*  BUN <5* 8  CREATININE 0.83 1.25*  CALCIUM 8.5* 8.8*    PT/INR Recent Labs    05/05/20 0034  LABPROT 15.1  INR 1.2   CMP     Component Value Date/Time   NA 143 05/07/2020 0400   K 3.7 05/07/2020 0400   CL 106 05/07/2020 0400   CO2 28 05/07/2020 0400   GLUCOSE 121 (H) 05/07/2020 0400   BUN 8 05/07/2020 0400   CREATININE 1.25 (H) 05/07/2020 0400   CALCIUM 8.8 (L) 05/07/2020 0400   PROT 5.5 (L) 05/07/2020 0400   ALBUMIN 2.9 (L) 05/07/2020 0400   AST 44 (H) 05/07/2020 0400   ALT 37 05/07/2020 0400   ALKPHOS 77 05/07/2020 0400   BILITOT 1.4 (H) 05/07/2020 0400   GFRNONAA 43 (L) 05/07/2020 0400   GFRAA 49 (L) 05/07/2020 0400   Lipase  No results found for: LIPASE     Studies/Results: No results found.  Anti-infectives: Anti-infectives (From admission, onward)   Start     Dose/Rate Route Frequency Ordered Stop   05/16/2020 0600  cefoTEtan (CEFOTAN) 2 g in sodium chloride 0.9 % 100 mL IVPB        2 g 200 mL/hr over 30 Minutes Intravenous On call to O.R. 05/05/20 1533 05/12/2020 1106   05/05/20 1600  amoxicillin (AMOXIL) capsule 500 mg        500 mg Oral Every 12 hours 05/05/20 1447 05/10/20 2159       Assessment/Plan Anxiety Cirrhosis - Child's Pugh B based off of current labs. liver bx pending Anemia - hgb 9.8, plts 110, continue to monitor  Colon mass S/P left hemicolectomy and liver biopsy - POD#1 - no bowel function yet, continue entereg and clears - mobilize - d/c foley today - path pending - monitor incision, some erythema along left side   FEN -CLD, IVF at 61 cc/h VTE -on hold due to anemia ID -cefotetan pre-op  LOS: 7 days    Norm Parcel , [facility] 05/07/2020, 7:50 AM Please see Amion for pager number during day hours 7:00am-4:30pm

## 2020-05-07 NOTE — Progress Notes (Signed)
PROGRESS NOTE    Regina Richards  ZOX:[id number] DOB: 04-Feb-1946 DOA: 05/13/2020 PCP: Patient, No Pcp Per    Chief Complaint  Patient presents with  . Loss of Consciousness    Brief Narrative:   74 year old lady with prior history of essential hypertension, anxiety, anemia brought in for exertional chest pain secondary to severe anemia.  Hemoglobin was 2.9 on admission currently around 8.  FOBT was negative in the ED.  GI was consulted patient underwent colonoscopy showing an obstructing sigmoid colon mass which was biopsied and results are pending.  Oncology and general surgery consulted for further recommendations. The biopsy results show Tubulovillous adenoma, focally suspicious for invasive carcinoma. Pt underwent  left  hemicolectomy on 05/12/2020. Post op Day 1 pt appears comfortable, reports pain is well controlled.  She is on clears today. No BM or flatulence yet.    Assessment & Plan:   Principal Problem:   Symptomatic anemia Active Problems:   Essential hypertension, benign   Pancytopenia (HCC)   History of kidney stones   CKD (chronic kidney disease), stage III   Syncope and collapse   Bradycardia   Thrombocytopenia (HCC)   Iron deficiency anemia   Benign neoplasm of cecum   Benign neoplasm of transverse colon   Benign neoplasm of descending colon   Benign neoplasm of rectum   Colonic mass     Severe iron deficiency anemia probably secondary to sigmoid mass.  Patient was admitted with a hemoglobin of 2.9, s/p 3 units of PRBC transfusion and hemoglobin stable between 7-8.  Patient also underwent 1 dose of IV iron on 05/01/2020 and a repeat dose of Feraheme scheduled to be given on 05/08/2020. Hemoglobin around 9.8 today.    Sigmoid colon mass Seen at the time of colonoscopy by GI, biopsied and results show tubulovillous adenoma.   General surgery consulted for evaluation of resection/sigmoidectomy.  Path results show Tubulovillous adenoma, focally suspicious for  invasive carcinoma.she underwent left hemicolectomy.  currenlty on clears and able to tolerate.  Awaiting bowel function to resume.  No BM or flatulence yet.    Mild to moderate thrombocytopenia Unclear etiology  no evidence of bleeding Platelets at 110,000   Hypokalemia;  Replaced. Repeat potassium wnl.   Enterococcus UTI:  ON amoxicillin.      DVT prophylaxis: scd's Code Status: FULL CODE.  Family Communication: none at bedside.  Disposition:   Status is: Inpatient  Remains inpatient appropriate because:Ongoing diagnostic testing needed not appropriate for outpatient work up, IV treatments appropriate due to intensity of illness or inability to take PO and Inpatient level of care appropriate due to severity of illness, waiting for bowel function to resume    Dispo: The patient is from: Home              Anticipated d/c is to: Home              Anticipated d/c date is: 2 days              Patient currently is not medically stable to d/c.       Consultants:   Gastroenterology Hematology General surgery   Procedures: Colonoscopy  Left hemi colectomy.    Antimicrobials: Amoxicillin since 05/05/20   Subjective: No nausea, vomiting or abd pain.   Objective: Vitals:   05/07/20 0349 05/07/20 0500 05/07/20 0739 05/07/20 1053  BP: (!) 117/52  (!) 120/56 (!) 110/52  Pulse: 70  76 81  Resp: 16  17 17   Temp: 98.5  F (36.9 C)  98.5 F (36.9 C) 98.4 F (36.9 C)  TempSrc: Oral  Oral Oral  SpO2: 95%  95% 91%  Weight:  73.5 kg    Height:        Intake/Output Summary (Last 24 hours) at 05/07/2020 1456 Last data filed at 05/07/2020 0630 Gross per 24 hour  Intake 626.32 ml  Output 450 ml  Net 176.32 ml   Filed Weights   05/05/20 0500 05/22/2020 0604 05/07/20 0500  Weight: 76.5 kg 73.2 kg 73.5 kg    Examination:  General exam: alert and comfortable.  Respiratory system: clear to auscultation, no wheezing or rhonchi.  Cardiovascular system: S1S2, RRR, no  JVD, no pedal edema   Gastrointestinal system: Abdomen is soft , non distended, bowel sounds minimal.  Central nervous system: alert and oriented, non focal.  Extremities: No cyanosis.  Skin: No rashes seen.  Psychiatry:  Mood is appropriate.     Data Reviewed: I have personally reviewed following labs and imaging studies  CBC: Recent Labs  Lab 05/26/2020 1800 05/12/2020 1800 05/01/20 1010 05/02/20 0234 05/04/20 0120 05/05/20 0034 05/16/2020 0025 05/13/2020 1620 05/07/20 0400  WBC 3.6*   < > 5.7   < > 6.7 6.2 5.5 7.3 14.8*  NEUTROABS 3.0  --  4.4  --   --   --  3.6  --   --   HGB 2.7*   < > 7.4*   < > 7.1* 8.9* 9.4* 10.0* 9.8*  HCT 11.9*   < > 25.3*   < > 24.7* 30.4* 32.5* 34.5* 34.0*  MCV 73.0*   < > 81.6   < > 83.4 87.1 89.5 89.8 92.6  PLT 80*   < > 91*   < > 91* 96* 87* 102* 110*   < > = values in this interval not displayed.    Basic Metabolic Panel: Recent Labs  Lab 05/15/2020 1944 05/01/20 1010 05/04/20 1427 05/05/20 0034 05/03/2020 0025 05/05/2020 1620 05/07/20 0400  NA  --    < > 141 141 141 143 143  K  --    < > 3.6 3.2* 3.5 3.2* 3.7  CL  --    < > 106 106 103 107 106  CO2  --    < > 26 28 30 28 28   GLUCOSE  --    < > 92 84 140* 130* 121*  BUN  --    < > <5* <5* <5* <5* 8  CREATININE  --    < > 0.81 0.84 0.93 0.83 1.25*  CALCIUM  --    < > 8.8* 9.0 9.0 8.5* 8.8*  MG 2.0  --   --   --   --   --   --   PHOS 3.8  --   --   --   --   --   --    < > = values in this interval not displayed.    GFR: Estimated Creatinine Clearance: 39.4 mL/min (A) (by C-G formula based on SCr of 1.25 mg/dL (H)).  Liver Function Tests: Recent Labs  Lab 05/01/20 1010 05/02/20 0234 05/04/20 1427 05/05/2020 1620 05/07/20 0400  AST 38 38 36 48* 44*  ALT 29 30 31  36 37  ALKPHOS 79 78 92 83 77  BILITOT 3.1* 2.1* 1.4* 1.7* 1.4*  PROT 6.2* 5.7* 5.8* 5.7* 5.5*  ALBUMIN 3.3* 2.9* 3.0* 3.1* 2.9*    CBG: No results for input(s): GLUCAP in the last 168 hours.  Recent Results (from  the past 240 hour(s))  SARS Coronavirus 2 by RT PCR (hospital order, performed in [facility] hospital lab) Nasopharyngeal Nasopharyngeal Swab     Status: None   Collection Time: 04/28/2020  4:44 PM   Specimen: Nasopharyngeal Swab  Result Value Ref Range Status   SARS Coronavirus 2 NEGATIVE NEGATIVE Final    Comment: (NOTE) SARS-CoV-2 target nucleic acids are NOT DETECTED.  The SARS-CoV-2 RNA is generally detectable in upper and lower respiratory specimens during the acute phase of infection. The lowest concentration of SARS-CoV-2 viral copies this assay can detect is 250 copies / mL. A negative result does not preclude SARS-CoV-2 infection and should not be used as the sole basis for treatment or other patient management decisions.  A negative result may occur with improper specimen collection / handling, submission of specimen other than nasopharyngeal swab, presence of viral mutation(s) within the areas targeted by this assay, and inadequate number of viral copies (<250 copies / mL). A negative result must be combined with clinical observations, patient history, and epidemiological information.  Fact Sheet for Patients:   StrictlyIdeas.no  Fact Sheet for Healthcare Providers: BankingDealers.co.za  This test is not yet approved or  cleared by the [place] FDA and has been authorized for detection and/or diagnosis of SARS-CoV-2 by FDA under an Emergency Use Authorization (EUA).  This EUA will remain in effect (meaning this test can be used) for the duration of the COVID-19 declaration under Section 564(b)(1) of the Act, 21 U.S.C. section 360bbb-3(b)(1), unless the authorization is terminated or revoked sooner.  Performed at [facility], [place] [address] [place], [place] [address]   MRSA PCR Screening     Status: None   Collection Time: 05/01/20 12:01 AM   Specimen: Nasopharyngeal  Result Value Ref Range Status   MRSA by  PCR NEGATIVE NEGATIVE Final    Comment:        The GeneXpert MRSA Assay (FDA approved for NASAL specimens only), is one component of a comprehensive MRSA colonization surveillance program. It is not intended to diagnose MRSA infection nor to guide or monitor treatment for MRSA infections. Performed at [facility], [place] [address], [place], [place] [address]   Culture, Urine     Status: Abnormal   Collection Time: 05/02/20 10:15 AM   Specimen: Urine, Random  Result Value Ref Range Status   Specimen Description URINE, RANDOM  Final   Special Requests   Final    NONE Performed at [facility], [place] [address] [place], [place] [address]    Culture ENTEROCOCCUS FAECALIS (A)  Final   Report Status 05/04/2020 FINAL  Final   Organism ID, Bacteria ENTEROCOCCUS FAECALIS  Final      Susceptibility   Enterococcus faecalis - MIC*    AMPICILLIN <=2 SENSITIVE Sensitive     NITROFURANTOIN <=16 SENSITIVE Sensitive     VANCOMYCIN 2 SENSITIVE Sensitive     * ENTEROCOCCUS FAECALIS         Radiology Studies: No results found.      Scheduled Meds: . (feeding supplement) PROSource Plus  30 mL Oral BID BM  . sodium chloride   Intravenous Once  . alvimopan  12 mg Oral BID  . amoxicillin  500 mg Oral Q12H  . Chlorhexidine Gluconate Cloth  6 each Topical Daily  . feeding supplement  1 Container Oral TID BM  . methocarbamol  500 mg Oral TID  . polyethylene glycol  17 g  Oral Daily   Continuous Infusions: . lactated ringers 50 mL/hr at 05/07/20 0000     LOS: 7 days       Hosie Poisson, MD [facility]   To contact the attending provider between 7A-7P or the covering provider during after hours 7P-7A, please log into the web site [email or web address] and access using universal [place] password for that web site. If you do not have the password, please call the hospital operator.  05/07/2020, 2:56 PM

## 2020-05-07 NOTE — Progress Notes (Signed)
Physical Therapy Treatment Patient Details Name: Regina Richards MRN: [id number] DOB: 1945/12/01 Today's Date: 05/07/2020    History of Present Illness Pt is 74 yo female with PMH including anxiety, HTN, and kidney stones.  Pt presented with [facility] for 3-4 weeks.  She was found to have a hgb of 2.9 in the ED.  Pt admitted with symptomatic anemia and pancytopenia.  Pt had  colonscopy which showed partially obstructing sigmoid colon mass that was biopsied.  Pt is s/p L hemicolectomy , mobilization of splenic flexure, and liver biopsy on 05/10/2020    PT Comments    Pt with decline in mobility status due to surgery yesterday.  Goals and POC still remain appropriate.  She required mod A for bed mobility but was able to ambulate 150' with min guard.  Needed max cues to avoid pushing with L UE due to arterial line - this played role in decreased bed mobility.  Cont POC.     Follow Up Recommendations  No PT follow up;Supervision/Assistance - 24 hour     Equipment Recommendations  None recommended by PT    Recommendations for Other Services       Precautions / Restrictions Precautions Precautions: Fall Precaution Comments: arterial line L wrist    Mobility  Bed Mobility Overal bed mobility: Needs Assistance Bed Mobility: Supine to Sit;Sit to Supine     Supine to sit: Mod assist Sit to supine: Mod assist   General bed mobility comments: Assist to lift trunk and scoot forward.  Assist to scoot back in bed.  Max multimodal cues to not push with L wrist due to arterial line  Transfers Overall transfer level: Needs assistance Equipment used: Rolling walker (2 wheeled) Transfers: Sit to/from Stand Sit to Stand: Min assist         General transfer comment: cues to push with R hand  Ambulation/Gait Ambulation/Gait assistance: Min guard Gait Distance (Feet): 150 Feet Assistive device: Rolling walker (2 wheeled) Gait Pattern/deviations: Step-through pattern;Decreased stride length;Trunk  flexed     General Gait Details: Cues for RW and posture; kept L wrist in straight position   Stairs             Wheelchair Mobility    Modified Rankin (Stroke Patients Only)       Balance Overall balance assessment: Needs assistance Sitting-balance support: No upper extremity supported Sitting balance-Leahy Scale: Good     Standing balance support: Bilateral upper extremity supported;No upper extremity supported Standing balance-Leahy Scale: Fair Standing balance comment: RW for transfers; no support standing                            Cognition Arousal/Alertness: Awake/alert Behavior During Therapy: Impulsive Overall Cognitive Status: Within Functional Limits for tasks assessed                                 General Comments: Frequent cues for line safety      Exercises      General Comments General comments (skin integrity, edema, etc.): VSS      Pertinent Vitals/Pain Pain Assessment: Faces Faces Pain Scale: Hurts a little bit Pain Location: abdoman Pain Descriptors / Indicators: Discomfort;Sore Pain Intervention(s): Limited activity within patient's tolerance;Monitored during session;Repositioned    Home Living                      Prior  Function            PT Goals (current goals can now be found in the care plan section) Acute Rehab PT Goals Patient Stated Goal: return home PT Goal Formulation: With patient Time For Goal Achievement: 05/18/20 Potential to Achieve Goals: Good Progress towards PT goals: Not progressing toward goals - comment (new surgery)    Frequency    Min 3X/week      PT Plan Current plan remains appropriate    Co-evaluation              AM-PAC PT "6 Clicks" Mobility   Outcome Measure  Help needed turning from your back to your side while in a flat bed without using bedrails?: A Little Help needed moving from lying on your back to sitting on the side of a flat bed  without using bedrails?: A Lot Help needed moving to and from a bed to a chair (including a wheelchair)?: A Little Help needed standing up from a chair using your arms (e.g., wheelchair or bedside chair)?: A Little Help needed to walk in hospital room?: A Little Help needed climbing 3-5 steps with a railing? : A Little 6 Click Score: 17    End of Session Equipment Utilized During Treatment: Gait belt Activity Tolerance: Patient tolerated treatment well Patient left: in bed;with call bell/phone within reach Nurse Communication: Mobility status PT Visit Diagnosis: Other abnormalities of gait and mobility (R26.89)     Time: 7001-7494 PT Time Calculation (min) (ACUTE ONLY): 19 min  Charges:  $Gait Training: 8-22 mins                     Abran Richard, PT Acute Rehab Services Pager [phone] [facility] [place] 05/07/2020, 4:25 PM

## 2020-05-08 MED ORDER — SODIUM CHLORIDE 0.9 % IV SOLN: INTRAVENOUS | Status: DC | PRN

## 2020-05-08 MED ORDER — HEPARIN SODIUM (PORCINE) 5000 UNIT/ML IJ SOLN
5000.0000 [IU] | Freq: Three times a day (TID) | INTRAMUSCULAR | Status: DC
  Administered 2020-05-08 – 2020-05-19 (×31): 5000 [IU] via SUBCUTANEOUS
  Filled 2020-05-08 (×31): qty 1

## 2020-05-08 NOTE — Progress Notes (Signed)
2 Days Post-Op   Subjective/Chief Complaint: Patient reports small amount of flatus Still feels full, bloated No nausea or vomiting   Objective: Vital signs in last 24 hours: Temp:  [98.4 F (36.9 C)-99.5 F (37.5 C)] 99.5 F (37.5 C) (09/11 0354) Pulse Rate:  [74-81] 81 (09/11 0354) Resp:  [16-20] 18 (09/11 0354) BP: (110-149)/(52-64) 123/61 (09/11 0354) SpO2:  [91 %-98 %] 93 % (09/11 0354) Weight:  [75.5 kg] 75.5 kg (09/11 0500) Last BM Date: 05/04/20  Intake/Output from previous day: 09/10 0701 - 09/11 0700 In: 926 [P.O.:240; I.V.:686] Out: -  Intake/Output this shift: No intake/output data recorded.  General appearance: alert, cooperative and no distress GI: distended, some bowel sounds; incisional tenderness Staple line c/d/i  Lab Results:  Recent Labs    05/24/2020 1620 05/07/20 0400  WBC 7.3 14.8*  HGB 10.0* 9.8*  HCT 34.5* 34.0*  PLT 102* 110*   BMET Recent Labs    05/17/2020 1620 05/07/20 0400  NA 143 143  K 3.2* 3.7  CL 107 106  CO2 28 28  GLUCOSE 130* 121*  BUN <5* 8  CREATININE 0.83 1.25*  CALCIUM 8.5* 8.8*   PT/INR No results for input(s): LABPROT, INR in the last 72 hours. ABG No results for input(s): PHART, HCO3 in the last 72 hours.  Invalid input(s): PCO2, PO2  Studies/Results: No results found.  Anti-infectives: Anti-infectives (From admission, onward)   Start     Dose/Rate Route Frequency Ordered Stop   05/19/2020 0600  cefoTEtan (CEFOTAN) 2 g in sodium chloride 0.9 % 100 mL IVPB        2 g 200 mL/hr over 30 Minutes Intravenous On call to O.R. 05/05/20 1533 05/07/2020 1106   05/05/20 1600  amoxicillin (AMOXIL) capsule 500 mg        500 mg Oral Every 12 hours 05/05/20 1447 05/10/20 2159      Assessment/Plan: Anxiety Cirrhosis - Child's Pugh B based off of current labs.liver bx pending Anemia -hgb 9.8, plts 110, continue to monitor   Colon mass S/P left hemicolectomy and liver biopsy - 05/03/2020 - Dr. Georgette Dover  - small amount  of flatus; continue entereg and clears - mobilize - Foley removed - path pending - monitor incision   FEN -CLD, IVF at 50 cc/h VTE -per primary team; ok to restart ID -cefotetan pre-op  LOS: 8 days    [name] 05/08/2020

## 2020-05-08 NOTE — Progress Notes (Signed)
Patient was placed on NPO diet at 7:37am, no imaging ordered, no upcoming procedure/sx---she has been on clears in previous days, I paged [place], [place] x3 with no response, this NPO order today was entered by nurse named Mickel Baas with a Dr. Tarry Kos, I feel this order was placed on wrong patient, I have advised dr Karleen Hampshire, she has given verbal order to place diet back to clear liquids, order placed

## 2020-05-08 NOTE — Progress Notes (Signed)
Orthopedic Tech Progress Note Patient Details:  Courtni Balash [facility] Feb 05, 1946 [id number]  Ortho Devices Type of Ortho Device: Abdominal binder Ortho Device/Splint Interventions: Ordered, Application   Post Interventions Patient Tolerated: Well Instructions Provided: Adjustment of device, Care of device, Poper ambulation with device   Giovoni Bunch P Lorel Monaco 05/08/2020, 3:08 PM

## 2020-05-08 NOTE — Progress Notes (Signed)
PROGRESS NOTE    Regina Richards  IOE:[id number] DOB: [birth year] DOA: 05/20/2020 PCP: Patient, No Pcp Per    Chief Complaint  Patient presents with  . Loss of Consciousness    Brief Narrative:   74 year old lady with prior history of essential hypertension, anxiety, anemia brought in for exertional chest pain secondary to severe anemia.  Hemoglobin was 2.9 on admission currently around 8.  FOBT was negative in the ED.  GI was consulted patient underwent colonoscopy showing an obstructing sigmoid colon mass which was biopsied and results are pending.  Oncology and general surgery consulted for further recommendations. The biopsy results show Tubulovillous adenoma, focally suspicious for invasive carcinoma. Pt underwent  left  hemicolectomy on 05/13/2020. Post op Day 2 pt appears comfortable,. She is on clears today. One BM today.     Assessment & Plan:   Principal Problem:   Symptomatic anemia Active Problems:   Essential hypertension, benign   Pancytopenia (HCC)   History of kidney stones   CKD (chronic kidney disease), stage III   Syncope and collapse   Bradycardia   Thrombocytopenia (HCC)   Iron deficiency anemia   Benign neoplasm of cecum   Benign neoplasm of transverse colon   Benign neoplasm of descending colon   Benign neoplasm of rectum   Colonic mass     Severe iron deficiency anemia probably secondary to sigmoid mass.  Patient was admitted with a hemoglobin of 2.9, s/p 3 units of PRBC transfusion and hemoglobin stable between 7-8.  Patient also underwent 1 dose of IV iron on 05/01/2020 and a repeat dose of Feraheme scheduled to be given on 05/08/2020. Hemoglobin around 9.8. Repeat cbc in am.      Sigmoid colon mass Seen at the time of colonoscopy by GI, biopsied and results show tubulovillous adenoma.   General surgery consulted for evaluation of resection/sigmoidectomy.  Path results show Tubulovillous adenoma, focally suspicious for invasive carcinoma.she underwent  left hemicolectomy.  currenlty on clears and able to tolerate. She is able to tolerate, but has abd distention. abd pain is present. One BM this am.  Appreciate surgery recommendations.    Mild to moderate thrombocytopenia Unclear etiology  no evidence of bleeding Platelets at 110,000   Hypokalemia;  Replaced. Repeat potassium wnl.   Enterococcus UTI:  ON amoxicillin.      DVT prophylaxis: scd's Code Status: FULL CODE.  Family Communication: none at bedside.  Disposition:   Status is: Inpatient  Remains inpatient appropriate because:Ongoing diagnostic testing needed not appropriate for outpatient work up and Inpatient level of care appropriate due to severity of illness, waiting for bowel function to resume    Dispo: The patient is from: Home              Anticipated d/c is to: Home              Anticipated d/c date is: 2 days              Patient currently is not medically stable to d/c.       Consultants:   Gastroenterology Hematology General surgery   Procedures: Colonoscopy  Left hemi colectomy.    Antimicrobials: Amoxicillin since 05/05/20   Subjective: Some abd distention. abd pain present, one BM today.    Objective: Vitals:   05/08/20 0354 05/08/20 0500 05/08/20 0817 05/08/20 1200  BP: 123/61  (!) 125/58 122/60  Pulse: 81  78 78  Resp: 18  17 17   Temp: 99.5 F (37.5 C)  99.3 F (37.4 C) 98.8 F (37.1 C)  TempSrc: Oral  Oral Oral  SpO2: 93%  96% 96%  Weight:  75.5 kg    Height:        Intake/Output Summary (Last 24 hours) at 05/08/2020 1513 Last data filed at 05/08/2020 0824 Gross per 24 hour  Intake 1461.65 ml  Output --  Net 1461.65 ml   Filed Weights   05/24/2020 0604 05/07/20 0500 05/08/20 0500  Weight: 73.2 kg 73.5 kg 75.5 kg    Examination:  General exam: Alert and in mild distress from abd pain.  Respiratory system: Clear to auscultation, no wheezing or rhonchi.  Cardiovascular system: S1S2, RRR, no JVD,   Gastrointestinal system: Abd is soft , mildly distended, bowel sounds wnl.  Central nervous system: Alert and oriented.  Extremities: No cyanosis .  Skin: NO rashes seen.  Psychiatry:  Mood is appropriate.     Data Reviewed: I have personally reviewed following labs and imaging studies  CBC: Recent Labs  Lab 05/04/20 0120 05/05/20 0034 05/22/2020 0025 05/05/2020 1620 05/07/20 0400  WBC 6.7 6.2 5.5 7.3 14.8*  NEUTROABS  --   --  3.6  --   --   HGB 7.1* 8.9* 9.4* 10.0* 9.8*  HCT 24.7* 30.4* 32.5* 34.5* 34.0*  MCV 83.4 87.1 89.5 89.8 92.6  PLT 91* 96* 87* 102* 110*    Basic Metabolic Panel: Recent Labs  Lab 05/04/20 1427 05/05/20 0034 05/25/2020 0025 05/12/2020 1620 05/07/20 0400  NA 141 141 141 143 143  K 3.6 3.2* 3.5 3.2* 3.7  CL 106 106 103 107 106  CO2 26 28 30 28 28   GLUCOSE 92 84 140* 130* 121*  BUN <5* <5* <5* <5* 8  CREATININE 0.81 0.84 0.93 0.83 1.25*  CALCIUM 8.8* 9.0 9.0 8.5* 8.8*    GFR: Estimated Creatinine Clearance: 39.9 mL/min (A) (by C-G formula based on SCr of 1.25 mg/dL (H)).  Liver Function Tests: Recent Labs  Lab 05/02/20 0234 05/04/20 1427 05/25/2020 1620 05/07/20 0400  AST 38 36 48* 44*  ALT 30 31 36 37  ALKPHOS 78 92 83 77  BILITOT 2.1* 1.4* 1.7* 1.4*  PROT 5.7* 5.8* 5.7* 5.5*  ALBUMIN 2.9* 3.0* 3.1* 2.9*    CBG: No results for input(s): GLUCAP in the last 168 hours.   Recent Results (from the past 240 hour(s))  SARS Coronavirus 2 by RT PCR (hospital order, performed in [facility] hospital lab) Nasopharyngeal Nasopharyngeal Swab     Status: None   Collection Time: 05/07/2020  4:44 PM   Specimen: Nasopharyngeal Swab  Result Value Ref Range Status   SARS Coronavirus 2 NEGATIVE NEGATIVE Final    Comment: (NOTE) SARS-CoV-2 target nucleic acids are NOT DETECTED.  The SARS-CoV-2 RNA is generally detectable in upper and lower respiratory specimens during the acute phase of infection. The lowest concentration of SARS-CoV-2 viral copies  this assay can detect is 250 copies / mL. A negative result does not preclude SARS-CoV-2 infection and should not be used as the sole basis for treatment or other patient management decisions.  A negative result may occur with improper specimen collection / handling, submission of specimen other than nasopharyngeal swab, presence of viral mutation(s) within the areas targeted by this assay, and inadequate number of viral copies (<250 copies / mL). A negative result must be combined with clinical observations, patient history, and epidemiological information.  Fact Sheet for Patients:   StrictlyIdeas.no  Fact Sheet for Healthcare Providers: BankingDealers.co.za  This test  is not yet approved or  cleared by the [place] and has been authorized for detection and/or diagnosis of SARS-CoV-2 by FDA under an Emergency Use Authorization (EUA).  This EUA will remain in effect (meaning this test can be used) for the duration of the COVID-19 declaration under Section 564(b)(1) of the Act, 21 U.S.C. section 360bbb-3(b)(1), unless the authorization is terminated or revoked sooner.  Performed at [facility], [place] [address] [place], [place] [address]   MRSA PCR Screening     Status: None   Collection Time: 05/01/20 12:01 AM   Specimen: Nasopharyngeal  Result Value Ref Range Status   MRSA by PCR NEGATIVE NEGATIVE Final    Comment:        The GeneXpert MRSA Assay (FDA approved for NASAL specimens only), is one component of a comprehensive MRSA colonization surveillance program. It is not intended to diagnose MRSA infection nor to guide or monitor treatment for MRSA infections. Performed at [facility], [place] [address] [place], [place] [address]   Culture, Urine     Status: Abnormal   Collection Time: 05/02/20 10:15 AM   Specimen: Urine, Random  Result Value Ref Range Status   Specimen Description URINE, RANDOM  Final    Special Requests   Final    NONE Performed at [facility], [place] [address] [place], [place] [address]    Culture ENTEROCOCCUS FAECALIS (A)  Final   Report Status 05/04/2020 FINAL  Final   Organism ID, Bacteria ENTEROCOCCUS FAECALIS  Final      Susceptibility   Enterococcus faecalis - MIC*    AMPICILLIN <=2 SENSITIVE Sensitive     NITROFURANTOIN <=16 SENSITIVE Sensitive     VANCOMYCIN 2 SENSITIVE Sensitive     * ENTEROCOCCUS FAECALIS         Radiology Studies: No results found.      Scheduled Meds: . (feeding supplement) PROSource Plus  30 mL Oral BID BM  . sodium chloride   Intravenous Once  . alvimopan  12 mg Oral BID  . amoxicillin  500 mg Oral Q12H  . Chlorhexidine Gluconate Cloth  6 each Topical Daily  . feeding supplement  1 Container Oral TID BM  . heparin injection (subcutaneous)  5,000 Units Subcutaneous Q8H  . methocarbamol  500 mg Oral TID  . polyethylene glycol  17 g Oral Daily   Continuous Infusions: . sodium chloride    . lactated ringers 50 mL/hr at 05/08/20 1400     LOS: 8 days       Hosie Poisson, MD [facility]   To contact the attending provider between 7A-7P or the covering provider during after hours 7P-7A, please log into the web site [email or web address] and access using universal [place] password for that web site. If you do not have the password, please call the hospital operator.  05/08/2020, 3:13 PM

## 2020-05-09 ENCOUNTER — Inpatient Hospital Stay (HOSPITAL_COMMUNITY): Payer: Medicare Other

## 2020-05-09 DIAGNOSIS — I10 Essential (primary) hypertension: Secondary | ICD-10-CM

## 2020-05-09 LAB — CBC
HCT: 31.7 % — ABNORMAL LOW (ref 36.0–46.0)
Hemoglobin: 9.5 g/dL — ABNORMAL LOW (ref 12.0–15.0)
MCH: 27.2 pg (ref 26.0–34.0)
MCHC: 30 g/dL (ref 30.0–36.0)
MCV: 90.8 fL (ref 80.0–100.0)
Platelets: 73 10*3/uL — ABNORMAL LOW (ref 150–400)
RBC: 3.49 MIL/uL — ABNORMAL LOW (ref 3.87–5.11)
RDW: 28.1 % — ABNORMAL HIGH (ref 11.5–15.5)
WBC: 8 10*3/uL (ref 4.0–10.5)
nRBC: 0 % (ref 0.0–0.2)

## 2020-05-09 LAB — MAGNESIUM: Magnesium: 1.4 mg/dL — ABNORMAL LOW (ref 1.7–2.4)

## 2020-05-09 LAB — BASIC METABOLIC PANEL
Anion gap: 9 (ref 5–15)
BUN: 12 mg/dL (ref 8–23)
CO2: 27 mmol/L (ref 22–32)
Calcium: 8.3 mg/dL — ABNORMAL LOW (ref 8.9–10.3)
Chloride: 101 mmol/L (ref 98–111)
Creatinine, Ser: 1.32 mg/dL — ABNORMAL HIGH (ref 0.44–1.00)
GFR calc Af Amer: 46 mL/min — ABNORMAL LOW (ref >60)
GFR calc non Af Amer: 40 mL/min — ABNORMAL LOW (ref >60)
Glucose, Bld: 117 mg/dL — ABNORMAL HIGH (ref 70–99)
Potassium: 2.9 mmol/L — ABNORMAL LOW (ref 3.5–5.1)
Sodium: 137 mmol/L (ref 135–145)

## 2020-05-09 MED ORDER — POTASSIUM CHLORIDE 10 MEQ/100ML IV SOLN
10.0000 meq | INTRAVENOUS | Status: AC
  Administered 2020-05-09 (×2): 10 meq via INTRAVENOUS
  Filled 2020-05-09 (×2): qty 100

## 2020-05-09 MED ORDER — ENSURE ENLIVE PO LIQD
237.0000 mL | Freq: Two times a day (BID) | ORAL | Status: DC
  Administered 2020-05-09 – 2020-05-17 (×9): 237 mL via ORAL

## 2020-05-09 MED ORDER — POTASSIUM CHLORIDE CRYS ER 20 MEQ PO TBCR
40.0000 meq | EXTENDED_RELEASE_TABLET | Freq: Two times a day (BID) | ORAL | Status: AC
  Administered 2020-05-09 (×2): 40 meq via ORAL
  Filled 2020-05-09 (×2): qty 2

## 2020-05-09 NOTE — Progress Notes (Signed)
Progress Note: General Surgery Service   Chief Complaint/Subjective: Pain controlled, tolerating some liquids, multiple bowel movements  Objective: Vital signs in last 24 hours: Temp:  [97.8 F (36.6 C)-100.3 F (37.9 C)] 98.2 F (36.8 C) (09/12 0753) Pulse Rate:  [76-98] 76 (09/12 0357) Resp:  [17-20] 17 (09/12 0357) BP: (120-152)/(58-98) 124/98 (09/12 0753) SpO2:  [93 %-99 %] 97 % (09/12 0753) Weight:  [75.9 kg] 75.9 kg (09/12 0442) Last BM Date: 05/09/20  Intake/Output from previous day: 09/11 0701 - 09/12 0700 In: 1572.7 [P.O.:120; I.V.:1452.7] Out: -  Intake/Output this shift: No intake/output data recorded.  Gen: NAD  Resp: nonlabored  Card: RRR  Abd: soft, abdominal obesity, incision c/d/i, nontender  Lab Results: CBC  Recent Labs    05/07/20 0400 05/09/20 0427  WBC 14.8* 8.0  HGB 9.8* 9.5*  HCT 34.0* 31.7*  PLT 110* 73*   BMET Recent Labs    05/07/20 0400 05/09/20 0427  NA 143 137  K 3.7 2.9*  CL 106 101  CO2 28 27  GLUCOSE 121* 117*  BUN 8 12  CREATININE 1.25* 1.32*  CALCIUM 8.8* 8.3*   PT/INR No results for input(s): LABPROT, INR in the last 72 hours. ABG No results for input(s): PHART, HCO3 in the last 72 hours.  Invalid input(s): PCO2, PO2  Anti-infectives: Anti-infectives (From admission, onward)   Start     Dose/Rate Route Frequency Ordered Stop   05/03/2020 0600  cefoTEtan (CEFOTAN) 2 g in sodium chloride 0.9 % 100 mL IVPB        2 g 200 mL/hr over 30 Minutes Intravenous On call to O.R. 05/05/20 1533 05/26/2020 1106   05/05/20 1600  amoxicillin (AMOXIL) capsule 500 mg        500 mg Oral Every 12 hours 05/05/20 1447 05/10/20 2159      Medications: Scheduled Meds: . (feeding supplement) PROSource Plus  30 mL Oral BID BM  . sodium chloride   Intravenous Once  . alvimopan  12 mg Oral BID  . amoxicillin  500 mg Oral Q12H  . Chlorhexidine Gluconate Cloth  6 each Topical Daily  . feeding supplement  1 Container Oral TID BM  .  feeding supplement (ENSURE ENLIVE)  237 mL Oral BID BM  . heparin injection (subcutaneous)  5,000 Units Subcutaneous Q8H  . methocarbamol  500 mg Oral TID  . polyethylene glycol  17 g Oral Daily   Continuous Infusions: . sodium chloride    . lactated ringers 50 mL/hr at 05/09/20 0600   PRN Meds:.Place/Maintain arterial line **AND** sodium chloride, acetaminophen **OR** acetaminophen, ondansetron **OR** ondansetron (ZOFRAN) IV, oxyCODONE  Assessment/Plan: s/p Procedure(s): EXPLORATORY LAPAROTOMY PARTIAL COLECTOMY LIVER BIOPSY 05/05/2020  3 BMs yesterday, tolerating clear liquids Walked in hall yesterday  FEN- full liquids, saline lock VTE- heparin q8h ID- cefotetan pre-op    LOS: 9 days   Mickeal Skinner, MD [facility]

## 2020-05-09 NOTE — Progress Notes (Signed)
PROGRESS NOTE    Regina Richards  CZY:[id number] DOB: July 26, 1946 DOA: 05/19/2020 PCP: Patient, No Pcp Per    Chief Complaint  Patient presents with  . Loss of Consciousness    Brief Narrative:   74 year old lady with prior history of essential hypertension, anxiety, anemia brought in for exertional chest pain secondary to severe anemia.  Hemoglobin was 2.9 on admission currently around 8.  FOBT was negative in the ED.  GI was consulted patient underwent colonoscopy showing an obstructing sigmoid colon mass which was biopsied and results are pending.  Oncology and general surgery consulted for further recommendations. The biopsy results show Tubulovillous adenoma, focally suspicious for invasive carcinoma. Pt underwent  left  hemicolectomy on 05/19/2020. Post op Day 3, pt seen and examined, no new complaints. Had 2 to  3 bowel movements yesterday, passing flatulence.  Pain is improving.      Assessment & Plan:   Principal Problem:   Symptomatic anemia Active Problems:   Essential hypertension, benign   Pancytopenia (HCC)   History of kidney stones   CKD (chronic kidney disease), stage III   Syncope and collapse   Bradycardia   Thrombocytopenia (HCC)   Iron deficiency anemia   Benign neoplasm of cecum   Benign neoplasm of transverse colon   Benign neoplasm of descending colon   Benign neoplasm of rectum   Colonic mass     Severe iron deficiency anemia probably secondary to sigmoid mass.  Patient was admitted with a hemoglobin of 2.9, s/p 3 units of PRBC transfusion and hemoglobin stable between 7-8.  Patient also underwent 1 dose of IV iron on 05/01/2020 and a repeat dose of Feraheme scheduled to be given on 05/08/2020. Hemoglobin stable around 9.5   Sigmoid colon mass Seen at the time of colonoscopy by GI, biopsied and results show tubulovillous adenoma.   General surgery consulted for evaluation of resection/sigmoidectomy.  Path results show Tubulovillous adenoma, focally  suspicious for invasive carcinoma.she underwent left hemicolectomy.  Had 2 to3 bowel movements yesterday, able to pass flatulence. Surgery advanced diet to full liquid diet today.  Appreciate surgery recommendations.  Recommend outpatient follow up with Dr Vianne Bulls oncologist once she is discharged.    Mild to moderate thrombocytopenia Probably secondary to cirrhosis.  Platelets around 73,000. No bleeding evident.   Hypokalemia;  Replaced. Repeat k is 2.9 today, IV replacements ordered, check mag level.   Enterococcus UTI:  Complete the last dose of amoxicillin today.    Mild AKI:  Post op intravascular volume depletion.  Hydrate with IV fluids and repeat renal parameters in am.  Bicarb wnl.    Liver cirrhosis:  Ammonia level is 30, mild to mod thrombocytopenia, and elevated AST.  Recommend outpatient follow up with GI.  She will need EGD at some point to evaluate for portal hypertension, to be scheduled as outpatient.    Low grade fever of 100.3 overnight.  Will get blood cultures, CXR and UA for further evaluation.   DVT prophylaxis: scd's Code Status: FULL CODE.  Family Communication: none at bedside.  Disposition:   Status is: Inpatient  Remains inpatient appropriate because:Ongoing diagnostic testing needed not appropriate for outpatient work up and Inpatient level of care appropriate due to severity of illness, waiting for bowel function to resume , and advacne to regular diet prior to discharge.    Dispo: The patient is from: Home              Anticipated d/c is to: Home  Anticipated d/c date is: 2 days              Patient currently is not medically stable to d/c.       Consultants:   Gastroenterology Hematology General surgery Cardiology.    Procedures: Colonoscopy  Left hemi colectomy.    Antimicrobials: Amoxicillin since 05/05/20   Subjective: No sob, or chest pain. abd pain is improving, 2 to 3 BM yesterday, able to pass  flatulence.   Objective: Vitals:   05/08/20 2356 05/09/20 0357 05/09/20 0442 05/09/20 0753  BP: 134/67 120/64  (!) 124/98  Pulse: 98 76    Resp: 20 17    Temp: 100.3 F (37.9 C) 98.4 F (36.9 C)  98.2 F (36.8 C)  TempSrc: Oral Oral  Oral  SpO2: 99% 97%  97%  Weight:   75.9 kg   Height:        Intake/Output Summary (Last 24 hours) at 05/09/2020 1418 Last data filed at 05/09/2020 0600 Gross per 24 hour  Intake 1037.04 ml  Output --  Net 1037.04 ml   Filed Weights   05/07/20 0500 05/08/20 0500 05/09/20 0442  Weight: 73.5 kg 75.5 kg 75.9 kg    Examination:  General exam: Alert and, not in any kind of distress Respiratory system: Diminished air entry at bases, no wheezing or rhonchi no tachypnea Cardiovascular system: S1-S2 heard, regular rate rhythm, no JVD, no pedal edema Gastrointestinal system: Abdomen is firm mildly distended, bowel sounds minimal, tenderness improving Central nervous system: Alert and oriented to place and person Extremities: No pedal edema or cyanosis Skin: No rashes seen Psychiatry: Mood is appropriate    Data Reviewed: I have personally reviewed following labs and imaging studies  CBC: Recent Labs  Lab 05/05/20 0034 05/19/2020 0025 05/13/2020 1620 05/07/20 0400 05/09/20 0427  WBC 6.2 5.5 7.3 14.8* 8.0  NEUTROABS  --  3.6  --   --   --   HGB 8.9* 9.4* 10.0* 9.8* 9.5*  HCT 30.4* 32.5* 34.5* 34.0* 31.7*  MCV 87.1 89.5 89.8 92.6 90.8  PLT 96* 87* 102* 110* 73*    Basic Metabolic Panel: Recent Labs  Lab 05/05/20 0034 05/08/2020 0025 05/25/2020 1620 05/07/20 0400 05/09/20 0427  NA 141 141 143 143 137  K 3.2* 3.5 3.2* 3.7 2.9*  CL 106 103 107 106 101  CO2 28 30 28 28 27   GLUCOSE 84 140* 130* 121* 117*  BUN <5* <5* <5* 8 12  CREATININE 0.84 0.93 0.83 1.25* 1.32*  CALCIUM 9.0 9.0 8.5* 8.8* 8.3*    GFR: Estimated Creatinine Clearance: 37.9 mL/min (A) (by C-G formula based on SCr of 1.32 mg/dL (H)).  Liver Function Tests: Recent Labs   Lab 05/04/20 1427 05/12/2020 1620 05/07/20 0400  AST 36 48* 44*  ALT 31 36 37  ALKPHOS 92 83 77  BILITOT 1.4* 1.7* 1.4*  PROT 5.8* 5.7* 5.5*  ALBUMIN 3.0* 3.1* 2.9*    CBG: No results for input(s): GLUCAP in the last 168 hours.   Recent Results (from the past 240 hour(s))  SARS Coronavirus 2 by RT PCR (hospital order, performed in [facility] hospital lab) Nasopharyngeal Nasopharyngeal Swab     Status: None   Collection Time: 05/09/2020  4:44 PM   Specimen: Nasopharyngeal Swab  Result Value Ref Range Status   SARS Coronavirus 2 NEGATIVE NEGATIVE Final    Comment: (NOTE) SARS-CoV-2 target nucleic acids are NOT DETECTED.  The SARS-CoV-2 RNA is generally detectable in upper and lower respiratory specimens  during the acute phase of infection. The lowest concentration of SARS-CoV-2 viral copies this assay can detect is 250 copies / mL. A negative result does not preclude SARS-CoV-2 infection and should not be used as the sole basis for treatment or other patient management decisions.  A negative result may occur with improper specimen collection / handling, submission of specimen other than nasopharyngeal swab, presence of viral mutation(s) within the areas targeted by this assay, and inadequate number of viral copies (<250 copies / mL). A negative result must be combined with clinical observations, patient history, and epidemiological information.  Fact Sheet for Patients:   StrictlyIdeas.no  Fact Sheet for Healthcare Providers: BankingDealers.co.za  This test is not yet approved or  cleared by the [place] FDA and has been authorized for detection and/or diagnosis of SARS-CoV-2 by FDA under an Emergency Use Authorization (EUA).  This EUA will remain in effect (meaning this test can be used) for the duration of the COVID-19 declaration under Section 564(b)(1) of the Act, 21 U.S.C. section 360bbb-3(b)(1), unless the  authorization is terminated or revoked sooner.  Performed at [facility], [place] [address], [place], [place] [address]   MRSA PCR Screening     Status: None   Collection Time: 05/01/20 12:01 AM   Specimen: Nasopharyngeal  Result Value Ref Range Status   MRSA by PCR NEGATIVE NEGATIVE Final    Comment:        The GeneXpert MRSA Assay (FDA approved for NASAL specimens only), is one component of a comprehensive MRSA colonization surveillance program. It is not intended to diagnose MRSA infection nor to guide or monitor treatment for MRSA infections. Performed at [facility], [place] [address], [place], [place] [address]   Culture, Urine     Status: Abnormal   Collection Time: 05/02/20 10:15 AM   Specimen: Urine, Random  Result Value Ref Range Status   Specimen Description URINE, RANDOM  Final   Special Requests   Final    NONE Performed at [facility], [place] [address] [place], [place] [address]    Culture ENTEROCOCCUS FAECALIS (A)  Final   Report Status 05/04/2020 FINAL  Final   Organism ID, Bacteria ENTEROCOCCUS FAECALIS  Final      Susceptibility   Enterococcus faecalis - MIC*    AMPICILLIN <=2 SENSITIVE Sensitive     NITROFURANTOIN <=16 SENSITIVE Sensitive     VANCOMYCIN 2 SENSITIVE Sensitive     * ENTEROCOCCUS FAECALIS         Radiology Studies: No results found.      Scheduled Meds: . (feeding supplement) PROSource Plus  30 mL Oral BID BM  . sodium chloride   Intravenous Once  . amoxicillin  500 mg Oral Q12H  . Chlorhexidine Gluconate Cloth  6 each Topical Daily  . feeding supplement  1 Container Oral TID BM  . feeding supplement (ENSURE ENLIVE)  237 mL Oral BID BM  . heparin injection (subcutaneous)  5,000 Units Subcutaneous Q8H  . methocarbamol  500 mg Oral TID  . polyethylene glycol  17 g Oral Daily   Continuous Infusions: . sodium chloride    . lactated ringers 50 mL/hr at 05/09/20 0600     LOS: 9 days       Hosie Poisson, MD [facility]   To contact the attending provider between 7A-7P or the covering provider during after hours 7P-7A, please log into the web site [email or web address] and access using universal [place] password  for that web site. If you do not have the password, please call the hospital operator.  05/09/2020, 2:18 PM

## 2020-05-10 LAB — BASIC METABOLIC PANEL
Anion gap: 13 (ref 5–15)
BUN: 29 mg/dL — ABNORMAL HIGH (ref 8–23)
CO2: 22 mmol/L (ref 22–32)
Calcium: 8.7 mg/dL — ABNORMAL LOW (ref 8.9–10.3)
Chloride: 102 mmol/L (ref 98–111)
Creatinine, Ser: 1.99 mg/dL — ABNORMAL HIGH (ref 0.44–1.00)
GFR calc Af Amer: 28 mL/min — ABNORMAL LOW (ref >60)
GFR calc non Af Amer: 24 mL/min — ABNORMAL LOW (ref >60)
Glucose, Bld: 178 mg/dL — ABNORMAL HIGH (ref 70–99)
Potassium: 4.3 mmol/L (ref 3.5–5.1)
Sodium: 137 mmol/L (ref 135–145)

## 2020-05-10 LAB — CBC WITH DIFFERENTIAL/PLATELET
Abs Immature Granulocytes: 0.06 10*3/uL (ref 0.00–0.07)
Basophils Absolute: 0 10*3/uL (ref 0.0–0.1)
Basophils Relative: 0 %
Eosinophils Absolute: 0 10*3/uL (ref 0.0–0.5)
Eosinophils Relative: 0 %
HCT: 36.4 % (ref 36.0–46.0)
Hemoglobin: 10.6 g/dL — ABNORMAL LOW (ref 12.0–15.0)
Immature Granulocytes: 1 %
Lymphocytes Relative: 3 %
Lymphs Abs: 0.3 10*3/uL — ABNORMAL LOW (ref 0.7–4.0)
MCH: 27 pg (ref 26.0–34.0)
MCHC: 29.1 g/dL — ABNORMAL LOW (ref 30.0–36.0)
MCV: 92.9 fL (ref 80.0–100.0)
Monocytes Absolute: 0.9 10*3/uL (ref 0.1–1.0)
Monocytes Relative: 8 %
Neutro Abs: 9.4 10*3/uL — ABNORMAL HIGH (ref 1.7–7.7)
Neutrophils Relative %: 88 %
Platelets: 112 10*3/uL — ABNORMAL LOW (ref 150–400)
RBC: 3.92 MIL/uL (ref 3.87–5.11)
RDW: 27.8 % — ABNORMAL HIGH (ref 11.5–15.5)
WBC: 10.7 10*3/uL — ABNORMAL HIGH (ref 4.0–10.5)
nRBC: 0 % (ref 0.0–0.2)

## 2020-05-10 LAB — TYPE AND SCREEN
ABO/RH(D): O POS
Antibody Screen: NEGATIVE
Unit division: 0
Unit division: 0

## 2020-05-10 LAB — BPAM RBC
Blood Product Expiration Date: 202110082359
Blood Product Expiration Date: 202110092359
ISSUE DATE / TIME: 202109090930
Unit Type and Rh: 5100
Unit Type and Rh: 5100

## 2020-05-10 LAB — MAGNESIUM: Magnesium: 1.9 mg/dL (ref 1.7–2.4)

## 2020-05-10 LAB — PHOSPHORUS: Phosphorus: 4.3 mg/dL (ref 2.5–4.6)

## 2020-05-10 MED ORDER — OXYCODONE HCL 5 MG PO TABS
5.0000 mg | ORAL_TABLET | ORAL | Status: DC | PRN
  Administered 2020-05-10 – 2020-05-21 (×4): 5 mg via ORAL
  Filled 2020-05-10 (×4): qty 1

## 2020-05-10 MED ORDER — MAGNESIUM SULFATE 2 GM/50ML IV SOLN
2.0000 g | Freq: Once | INTRAVENOUS | Status: AC
  Administered 2020-05-10: 2 g via INTRAVENOUS
  Filled 2020-05-10: qty 50

## 2020-05-10 NOTE — Progress Notes (Signed)
Nutrition Follow-up  DOCUMENTATION CODES:   Not applicable  INTERVENTION:   - Ensure Enlive po BID, each supplement provides 350 kcal and 20 grams of protein  - ProSource Plus 30 ml po BID, each supplement provides 100 kcal and 15 grams of protein  - Encourage adequate PO intake  NUTRITION DIAGNOSIS:   Inadequate oral intake related to altered GI function as evidenced by other (clear liquid diet order).  Progressing, pt now on soft diet  GOAL:   Patient will meet greater than or equal to 90% of their needs  Progressing  MONITOR:   PO intake, Supplement acceptance, Diet advancement, Weight trends, Labs, Skin  REASON FOR ASSESSMENT:   NPO/Clear Liquid Diet    ASSESSMENT:   74 year old female who presented on 9/03 with syncopal episode and SOB. PMH of anxiety, HTN, nephrolithiasis. Pt admitted with severe iron deficiency anemia.  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  Weight trending back up over the last several days.  Spoke with pt at bedside. Pt had just finished her first solid meal since surgery. Pt had completed ~25% of meal. Pt reports that last night she had several bowel movements so she is "taking it easy" and starting slow when it comes to solid food. Pt reports that she did just finish an Ensure Enlive. Pt prefers the Ensure [employer] over [employer].  Pt reports that her appetite had been decreased for a few weeks PTA. She reports being in constant pain and ordering [employer] for  food rather than cooking herself. Pt reports that she had lost a little weight but is unsure how much. She is unsure of her UBW.  Discussed with pt the importance of adequate PO intake and especially protein intake in healing. Pt expresses understanding.  Meal Completion: 50-100%  Medications reviewed and include: ProSource Plus 30 ml BID, Boost Breeze TID, Ensure Enlive BID, miralax, IV magnesium sulfate 2 grams once IVF: LR @ 50 ml/hr  Labs reviewed: potassium 2.9, magnesium 1.4, hemoglobin 9.5  NUTRITION - FOCUSED PHYSICAL EXAM:    Most Recent Value  Orbital Region Mild depletion  Upper Arm Region No depletion  Thoracic and Lumbar Region No depletion  Buccal Region No depletion  Temple Region Mild depletion  Clavicle Bone Region Mild depletion  Clavicle and Acromion Bone Region Mild depletion  Scapular Bone Region No depletion  Dorsal Hand No depletion  Patellar Region No depletion  Anterior Thigh Region Mild depletion  Posterior Calf Region Mild depletion  Edema (RD Assessment) None  Hair Reviewed  Eyes Reviewed  Mouth Reviewed  Skin Reviewed  Nails Reviewed       Diet Order:   Diet Order            DIET SOFT Room service appropriate? Yes; Fluid consistency: Thin  Diet effective now                 EDUCATION NEEDS:   No education needs have been identified at this time  Skin:  Skin Assessment: Skin Integrity Issues: Incisions: closed abdomen  Last BM:  05/10/20 large type 7 via rectal tube  Height:   Ht Readings from Last 1 Encounters:  05/15/2020 5\' 4"  (1.626 m)    Weight:   Wt Readings from Last 1 Encounters:  05/10/20 78.9 kg    Ideal Body Weight:  54.5 kg  BMI:  Body mass index is 29.86 kg/m.  Estimated Nutritional Needs:   Kcal:  1800-2000  Protein:  85-100 grams  Fluid:  1.8-2.0 L    Gaynell Face, MS, RD, LDN Inpatient Clinical Dietitian Please see AMiON for contact information.

## 2020-05-10 NOTE — Plan of Care (Signed)

## 2020-05-10 NOTE — Progress Notes (Signed)
Physical Therapy Treatment Patient Details Name: Regina Richards MRN: [id number] DOB: Mar 17, 1946 Today's Date: 05/10/2020    History of Present Illness Pt is 74 yo female with PMH including anxiety, HTN, and kidney stones.  Pt presented with [facility] for 3-4 weeks.  She was found to have a hgb of 2.9 in the ED.  Pt admitted with symptomatic anemia and pancytopenia.  Pt had  colonscopy which showed partially obstructing sigmoid colon mass that was biopsied.  Pt is s/p L hemicolectomy , mobilization of splenic flexure, and liver biopsy on 05/22/2020    PT Comments    Pt making steady progress. Pt reports support at home. Recommend HHPT after DC.    Follow Up Recommendations  Supervision/Assistance - 24 hour;Home health PT     Equipment Recommendations  None recommended by PT    Recommendations for Other Services       Precautions / Restrictions Precautions Precautions: Fall    Mobility  Bed Mobility Overal bed mobility: Needs Assistance Bed Mobility: Supine to Sit     Supine to sit: Min guard;HOB elevated     General bed mobility comments: Assist for safet and lines. Pt required incr time and effort.   Transfers Overall transfer level: Needs assistance Equipment used: 4-wheeled walker Transfers: Sit to/from Stand Sit to Stand: Min guard         General transfer comment: Verbal/tactile cues for hand placement. Assist for safety/lines  Ambulation/Gait Ambulation/Gait assistance: Min guard Gait Distance (Feet): 150 Feet Assistive device: 4-wheeled walker Gait Pattern/deviations: Step-through pattern;Decreased stride length;Trunk flexed Gait velocity: decreased Gait velocity interpretation: 1.31 - 2.62 ft/sec, indicative of limited community ambulator General Gait Details: Assist to stand more erect   Stairs             Wheelchair Mobility    Modified Rankin (Stroke Patients Only)       Balance Overall balance assessment: Needs assistance Sitting-balance  support: No upper extremity supported Sitting balance-Leahy Scale: Good     Standing balance support: No upper extremity supported Standing balance-Leahy Scale: Fair                              Cognition Arousal/Alertness: Awake/alert Behavior During Therapy: WFL for tasks assessed/performed Overall Cognitive Status: Within Functional Limits for tasks assessed                                        Exercises      General Comments General comments (skin integrity, edema, etc.): Pt on RA. SpO2 after sitting down after amb 90%      Pertinent Vitals/Pain Pain Assessment: Faces Faces Pain Scale: Hurts a little bit Pain Location: abdoman Pain Descriptors / Indicators: Discomfort;Sore Pain Intervention(s): Limited activity within patient's tolerance    Home Living                      Prior Function            PT Goals (current goals can now be found in the care plan section) Acute Rehab PT Goals Patient Stated Goal: return home Progress towards PT goals: Progressing toward goals    Frequency    Min 3X/week      PT Plan Discharge plan needs to be updated    Co-evaluation  AM-PAC PT "6 Clicks" Mobility   Outcome Measure  Help needed turning from your back to your side while in a flat bed without using bedrails?: A Little Help needed moving from lying on your back to sitting on the side of a flat bed without using bedrails?: A Little Help needed moving to and from a bed to a chair (including a wheelchair)?: A Little Help needed standing up from a chair using your arms (e.g., wheelchair or bedside chair)?: A Little Help needed to walk in hospital room?: A Little Help needed climbing 3-5 steps with a railing? : A Little 6 Click Score: 18    End of Session   Activity Tolerance: Patient tolerated treatment well Patient left: with call bell/phone within reach;in chair   PT Visit Diagnosis: Other  abnormalities of gait and mobility (R26.89)     Time: 1305-1330 PT Time Calculation (min) (ACUTE ONLY): 25 min  Charges:  $Gait Training: 23-37 mins                     [place] Pager [phone] Office [place] 05/10/2020, 4:33 PM

## 2020-05-10 NOTE — Progress Notes (Signed)
PROGRESS NOTE    Regina Richards  FXT:[id number] DOB: [birth year] DOA: 05/05/2020 PCP: Patient, No Pcp Per    Chief Complaint  Patient presents with  . Loss of Consciousness    Brief Narrative:   74 year old lady with prior history of essential hypertension, anxiety, anemia brought in for exertional chest pain secondary to severe anemia.  Hemoglobin was 2.9 on admission currently around 8.  FOBT was negative in the ED.  GI was consulted patient underwent colonoscopy showing an obstructing sigmoid colon mass which was biopsied and results are pending.  Oncology and general surgery consulted for further recommendations. The biopsy results show Tubulovillous adenoma, focally suspicious for invasive carcinoma. Pt underwent  left  hemicolectomy on 05/02/2020. Post op Day 4, pt seen and examined, had 2 bowel movements yesterday, slightly nauseated, no vomiting. Discontinue foley catheter.     Assessment & Plan:   Principal Problem:   Symptomatic anemia Active Problems:   Essential hypertension, benign   Pancytopenia (HCC)   History of kidney stones   CKD (chronic kidney disease), stage III   Syncope and collapse   Bradycardia   Thrombocytopenia (HCC)   Iron deficiency anemia   Benign neoplasm of cecum   Benign neoplasm of transverse colon   Benign neoplasm of descending colon   Benign neoplasm of rectum   Colonic mass     Severe iron deficiency anemia probably secondary to sigmoid mass.  Patient was admitted with a hemoglobin of 2.9, s/p 3 units of PRBC transfusion and hemoglobin stable between 7-8.  Patient also underwent 1 dose of IV iron on 05/01/2020 and a repeat dose of Feraheme scheduled to be given on 05/08/2020. Hemoglobin stable around 9.5. repeat labs are pending at this time.    Sigmoid colon mass Seen at the time of colonoscopy by GI, biopsied and results show tubulovillous adenoma.   General surgery consulted for evaluation of resection/sigmoidectomy.  Path results show  Tubulovillous adenoma, focally suspicious for invasive carcinoma. she underwent left hemicolectomy on 05/08/2020, pathology sent.  Currently on full liquid diet, she was able to tolerate , had 2 BM overnight, slightly nauseated this am.  Recommend outpatient follow up with Dr Vianne Bulls oncologist once she is discharged.    Mild to moderate thrombocytopenia Probably secondary to cirrhosis.  Platelets around 73,000. No bleeding evident.repeat labs are pending.    Hypokalemia;  Replaced.   Hypomagnesemia:  Replaced.   Enterococcus UTI:  Completed the course of amoxicillin.    Mild AKI:  Post op intravascular volume depletion.  Hydrate with IV fluids and repeat renal parameters in am.  Bicarb wnl.    Liver cirrhosis:  Ammonia level is 30, mild to mod thrombocytopenia, and elevated AST.  Recommend outpatient follow up with GI.  She will need EGD at some point to evaluate for portal hypertension, to be scheduled as outpatient.    Low grade fever of 100.3  On 05/08/20 CXR shows bilateral pleural effusions and atelectasis,  UA is pending.  Blood cultures ordered and negative so far.  IS ordered.   DVT prophylaxis: heparin Code Status: FULL CODE.  Family Communication: none at bedside.  Disposition:   Status is: Inpatient  Remains inpatient appropriate because:Ongoing diagnostic testing needed not appropriate for outpatient work up and Inpatient level of care appropriate due to severity of illness, waiting for bowel function to resume , and advacne to regular diet prior to discharge.    Dispo: The patient is from: Home  Anticipated d/c is to: Home              Anticipated d/c date is: 1 day              Patient currently is not medically stable to d/c.       Consultants:   Gastroenterology Hematology General surgery Cardiology.    Procedures: Colonoscopy  Left hemi colectomy.    Antimicrobials: Amoxicillin since 05/05/20   Subjective: Nauseated  slightly.  No chest pain or sob. D/c foley.    Objective: Vitals:   05/09/20 2317 05/10/20 0305 05/10/20 0500 05/10/20 0734  BP: 133/80 119/63  (!) 122/58  Pulse: 98 (!) 103    Resp: (!) 22 19  18   Temp: 98.6 F (37 C) 99.7 F (37.6 C)  99.1 F (37.3 C)  TempSrc: Oral Oral  Oral  SpO2: 94% 92%    Weight:   78.9 kg   Height:        Intake/Output Summary (Last 24 hours) at 05/10/2020 0947 Last data filed at 05/10/2020 0300 Gross per 24 hour  Intake 1497.1 ml  Output --  Net 1497.1 ml   Filed Weights   05/08/20 0500 05/09/20 0442 05/10/20 0500  Weight: 75.5 kg 75.9 kg 78.9 kg    Examination:  General exam: Alert and comfortable.  Respiratory system: Diminished air entry at bases, no wheezing or rhonchi, no tachypnea, Cardiovascular system: S1S2 heard, RRR, no JVD, no pedal edema.  Gastrointestinal system: Abdomen is soft, tenderness improving, bowel sounds wnl.  Central nervous system: Alert and oriented to place and person, grossly non focal.  Extremities: No pedal edema.  Skin: No rashes seen.  Psychiatry: Mood is appropriate    Data Reviewed: I have personally reviewed following labs and imaging studies  CBC: Recent Labs  Lab 05/05/20 0034 05/16/2020 0025 05/07/2020 1620 05/07/20 0400 05/09/20 0427  WBC 6.2 5.5 7.3 14.8* 8.0  NEUTROABS  --  3.6  --   --   --   HGB 8.9* 9.4* 10.0* 9.8* 9.5*  HCT 30.4* 32.5* 34.5* 34.0* 31.7*  MCV 87.1 89.5 89.8 92.6 90.8  PLT 96* 87* 102* 110* 73*    Basic Metabolic Panel: Recent Labs  Lab 05/05/20 0034 05/07/2020 0025 05/23/2020 1620 05/07/20 0400 05/09/20 0427 05/09/20 1519  NA 141 141 143 143 137  --   K 3.2* 3.5 3.2* 3.7 2.9*  --   CL 106 103 107 106 101  --   CO2 28 30 28 28 27   --   GLUCOSE 84 140* 130* 121* 117*  --   BUN <5* <5* <5* 8 12  --   CREATININE 0.84 0.93 0.83 1.25* 1.32*  --   CALCIUM 9.0 9.0 8.5* 8.8* 8.3*  --   MG  --   --   --   --   --  1.4*    GFR: Estimated Creatinine Clearance: 38.6  mL/min (A) (by C-G formula based on SCr of 1.32 mg/dL (H)).  Liver Function Tests: Recent Labs  Lab 05/04/20 1427 04/28/2020 1620 05/07/20 0400  AST 36 48* 44*  ALT 31 36 37  ALKPHOS 92 83 77  BILITOT 1.4* 1.7* 1.4*  PROT 5.8* 5.7* 5.5*  ALBUMIN 3.0* 3.1* 2.9*    CBG: No results for input(s): GLUCAP in the last 168 hours.   Recent Results (from the past 240 hour(s))  SARS Coronavirus 2 by RT PCR (hospital order, performed in [facility] hospital lab) Nasopharyngeal Nasopharyngeal Swab  Status: None   Collection Time: 05/18/2020  4:44 PM   Specimen: Nasopharyngeal Swab  Result Value Ref Range Status   SARS Coronavirus 2 NEGATIVE NEGATIVE Final    Comment: (NOTE) SARS-CoV-2 target nucleic acids are NOT DETECTED.  The SARS-CoV-2 RNA is generally detectable in upper and lower respiratory specimens during the acute phase of infection. The lowest concentration of SARS-CoV-2 viral copies this assay can detect is 250 copies / mL. A negative result does not preclude SARS-CoV-2 infection and should not be used as the sole basis for treatment or other patient management decisions.  A negative result may occur with improper specimen collection / handling, submission of specimen other than nasopharyngeal swab, presence of viral mutation(s) within the areas targeted by this assay, and inadequate number of viral copies (<250 copies / mL). A negative result must be combined with clinical observations, patient history, and epidemiological information.  Fact Sheet for Patients:   StrictlyIdeas.no  Fact Sheet for Healthcare Providers: BankingDealers.co.za  This test is not yet approved or  cleared by the [place] FDA and has been authorized for detection and/or diagnosis of SARS-CoV-2 by FDA under an Emergency Use Authorization (EUA).  This EUA will remain in effect (meaning this test can be used) for the duration of the COVID-19  declaration under Section 564(b)(1) of the Act, 21 U.S.C. section 360bbb-3(b)(1), unless the authorization is terminated or revoked sooner.  Performed at [facility], [place] [address] [place], [place] [address]   MRSA PCR Screening     Status: None   Collection Time: 05/01/20 12:01 AM   Specimen: Nasopharyngeal  Result Value Ref Range Status   MRSA by PCR NEGATIVE NEGATIVE Final    Comment:        The GeneXpert MRSA Assay (FDA approved for NASAL specimens only), is one component of a comprehensive MRSA colonization surveillance program. It is not intended to diagnose MRSA infection nor to guide or monitor treatment for MRSA infections. Performed at [facility], [place] [address], [place], [place] [address]   Culture, Urine     Status: Abnormal   Collection Time: 05/02/20 10:15 AM   Specimen: Urine, Random  Result Value Ref Range Status   Specimen Description URINE, RANDOM  Final   Special Requests   Final    NONE Performed at [facility], [place] [address], [place], [place] [address]    Culture ENTEROCOCCUS FAECALIS (A)  Final   Report Status 05/04/2020 FINAL  Final   Organism ID, Bacteria ENTEROCOCCUS FAECALIS  Final      Susceptibility   Enterococcus faecalis - MIC*    AMPICILLIN <=2 SENSITIVE Sensitive     NITROFURANTOIN <=16 SENSITIVE Sensitive     VANCOMYCIN 2 SENSITIVE Sensitive     * ENTEROCOCCUS FAECALIS         Radiology Studies: DG Chest 2 View  Result Date: 05/09/2020 CLINICAL DATA:  Fever today; underwent exploratory laparotomy and partial colectomy on 04/29/2020 EXAM: CHEST - 2 VIEW COMPARISON:  I 321 FINDINGS: Enlargement of cardiac silhouette with slight vascular congestion. Mediastinal contours normal. Bibasilar pleural effusions and atelectasis greater on RIGHT. Elevation of RIGHT diaphragm. Free air under the RIGHT diaphragm and outlining a bowel loop in the mid abdomen, likely related to exploratory laparotomy 3 days ago. Upper lungs  clear. No pneumothorax or acute osseous findings. IMPRESSION: Bibasilar effusions and atelectasis. Elevated RIGHT diaphragm with free intraperitoneal air likely related to recent laparotomy 3 days ago. Electronically Signed  By: Lavonia Dana M.D.   On: 05/09/2020 15:48        Scheduled Meds: . (feeding supplement) PROSource Plus  30 mL Oral BID BM  . sodium chloride   Intravenous Once  . amoxicillin  500 mg Oral Q12H  . Chlorhexidine Gluconate Cloth  6 each Topical Daily  . feeding supplement  1 Container Oral TID BM  . feeding supplement (ENSURE ENLIVE)  237 mL Oral BID BM  . heparin injection (subcutaneous)  5,000 Units Subcutaneous Q8H  . methocarbamol  500 mg Oral TID  . polyethylene glycol  17 g Oral Daily   Continuous Infusions: . sodium chloride    . lactated ringers 50 mL/hr at 05/10/20 0300  . magnesium sulfate       LOS: 10 days       Hosie Poisson, MD [facility]   To contact the attending provider between 7A-7P or the covering provider during after hours 7P-7A, please log into the web site [email or web address] and access using universal [place] password for that web site. If you do not have the password, please call the hospital operator.  05/10/2020, 9:47 AM

## 2020-05-10 NOTE — Progress Notes (Signed)
[facility] Progress Note  4 Days Post-Op  Subjective: Patient reports pain well controlled. She reports she had some diarrhea overnight but this is improving. Had some nausea overnight but this has resolved.   Objective: Vital signs in last 24 hours: Temp:  [98.6 F (37 C)-99.7 F (37.6 C)] 99.1 F (37.3 C) (09/13 0734) Pulse Rate:  [86-103] 103 (09/13 0305) Resp:  [18-22] 18 (09/13 0734) BP: (119-133)/(58-80) 122/58 (09/13 0734) SpO2:  [91 %-94 %] 92 % (09/13 0305) Weight:  [78.9 kg] 78.9 kg (09/13 0500) Last BM Date: 05/09/20  Intake/Output from previous day: 09/12 0701 - 09/13 0700 In: 1497.1 [P.O.:480; I.V.:870.9; IV Piggyback:146.2] Out: -  Intake/Output this shift: No intake/output data recorded.  PE: General: pleasant, WD, overweight female who is sitting up in NAD HEENT:  Sclera are anicteric.  PERRL.  Ears and nose without any masses or lesions.  Mouth is pink and moist Heart: regular, rate, and rhythm. Palpable radial and pedal pulses bilaterally Lungs: CTAB, no wheezes, rhonchi, or rales noted.  Respiratory effort nonlabored Abd: soft, appropriately ttp, mildly distended, incision c/d/i with staples present  MS: all 4 extremities are symmetrical with no cyanosis, clubbing, or edema. Skin: warm and dry with no masses, lesions, or rashes Neuro: Cranial nerves 2-12 grossly intact, sensation grossly intact throughout Psych: A&Ox3 with an appropriate affect.   Lab Results:  Recent Labs    05/09/20 0427  WBC 8.0  HGB 9.5*  HCT 31.7*  PLT 73*   BMET Recent Labs    05/09/20 0427  NA 137  K 2.9*  CL 101  CO2 27  GLUCOSE 117*  BUN 12  CREATININE 1.32*  CALCIUM 8.3*   PT/INR No results for input(s): LABPROT, INR in the last 72 hours. CMP     Component Value Date/Time   NA 137 05/09/2020 0427   K 2.9 (L) 05/09/2020 0427   CL 101 05/09/2020 0427   CO2 27 05/09/2020 0427   GLUCOSE 117 (H) 05/09/2020 0427   BUN 12 05/09/2020 0427    CREATININE 1.32 (H) 05/09/2020 0427   CALCIUM 8.3 (L) 05/09/2020 0427   PROT 5.5 (L) 05/07/2020 0400   ALBUMIN 2.9 (L) 05/07/2020 0400   AST 44 (H) 05/07/2020 0400   ALT 37 05/07/2020 0400   ALKPHOS 77 05/07/2020 0400   BILITOT 1.4 (H) 05/07/2020 0400   GFRNONAA 40 (L) 05/09/2020 0427   GFRAA 46 (L) 05/09/2020 0427   Lipase  No results found for: LIPASE     Studies/Results: DG Chest 2 View  Result Date: 05/09/2020 CLINICAL DATA:  Fever today; underwent exploratory laparotomy and partial colectomy on 05/13/2020 EXAM: CHEST - 2 VIEW COMPARISON:  I 321 FINDINGS: Enlargement of cardiac silhouette with slight vascular congestion. Mediastinal contours normal. Bibasilar pleural effusions and atelectasis greater on RIGHT. Elevation of RIGHT diaphragm. Free air under the RIGHT diaphragm and outlining a bowel loop in the mid abdomen, likely related to exploratory laparotomy 3 days ago. Upper lungs clear. No pneumothorax or acute osseous findings. IMPRESSION: Bibasilar effusions and atelectasis. Elevated RIGHT diaphragm with free intraperitoneal air likely related to recent laparotomy 3 days ago. Electronically Signed   By: Lavonia Dana M.D.   On: 05/09/2020 15:48    Anti-infectives: Anti-infectives (From admission, onward)   Start     Dose/Rate Route Frequency Ordered Stop   04/29/2020 0600  cefoTEtan (CEFOTAN) 2 g in sodium chloride 0.9 % 100 mL IVPB        2 g 200  mL/hr over 30 Minutes Intravenous On call to O.R. 05/05/20 1533 05/05/2020 1106   05/05/20 1600  amoxicillin (AMOXIL) capsule 500 mg        500 mg Oral Every 12 hours 05/05/20 1447 05/10/20 2159       Assessment/Plan Anxiety Cirrhosis - Child's Pugh B based off of current labs.liver bx pending Anemia -hgb 9.5 yesterday, plts 73, continue to monitor   Colon mass S/P left hemicolectomy and liver biopsy 9/9 Dr. Georgette Dover - POD#4 - was having diarrhea but that has resolved overnight - advance to soft diet today - continue to  mobilize - surgical path still pending - replace magnesium this AM, BMET pending but replace K if needed - I think may be ready for discharge from a surgical standpoint as early as tomorrow    FEN -soft diet, SLIV VTE -SQ heparin  ID -completed amoxicillin for UTI  LOS: 10 days    Norm Parcel , [facility] Surgery 05/10/2020, 10:57 AM Please see Amion for pager number during day hours 7:00am-4:30pm

## 2020-05-11 ENCOUNTER — Inpatient Hospital Stay (HOSPITAL_COMMUNITY): Payer: Medicare Other

## 2020-05-11 LAB — URINALYSIS, ROUTINE W REFLEX MICROSCOPIC
Bilirubin Urine: NEGATIVE
Glucose, UA: NEGATIVE mg/dL
Hgb urine dipstick: NEGATIVE
Ketones, ur: NEGATIVE mg/dL
Leukocytes,Ua: NEGATIVE
Nitrite: NEGATIVE
Protein, ur: NEGATIVE mg/dL
Specific Gravity, Urine: 1.015 (ref 1.005–1.030)
pH: 5 (ref 5.0–8.0)

## 2020-05-11 LAB — CREATININE, URINE, RANDOM: Creatinine, Urine: 166.94 mg/dL

## 2020-05-11 LAB — AMMONIA: Ammonia: 39 umol/L — ABNORMAL HIGH (ref 9–35)

## 2020-05-11 LAB — SODIUM, URINE, RANDOM: Sodium, Ur: <10 mmol/L

## 2020-05-11 MED ORDER — SODIUM CHLORIDE 0.9 % IV SOLN: INTRAVENOUS | Status: DC

## 2020-05-11 MED ORDER — LACTULOSE 10 GM/15ML PO SOLN
20.0000 g | Freq: Two times a day (BID) | ORAL | Status: DC
  Administered 2020-05-11 – 2020-05-12 (×3): 20 g via ORAL
  Filled 2020-05-11 (×3): qty 30

## 2020-05-11 NOTE — Plan of Care (Signed)

## 2020-05-11 NOTE — Progress Notes (Signed)
[facility] Progress Note  5 Days Post-Op  Subjective: Patient reports she feels well this AM. Denies further diarrhea although has rectal pouch in place. Denies feeling more bloated. Denies nausea. Reviewed pathology with patient, oncology already following.   Objective: Vital signs in last 24 hours: Temp:  [97.5 F (36.4 C)-98.4 F (36.9 C)] 98.4 F (36.9 C) (09/14 0731) Pulse Rate:  [84-96] 87 (09/14 0300) Resp:  [14-20] 16 (09/14 0300) BP: (116-150)/(54-72) 130/68 (09/14 0300) SpO2:  [91 %-100 %] 96 % (09/14 0300) Weight:  [78.9 kg] 78.9 kg (09/14 0300) Last BM Date: 05/09/20  Intake/Output from previous day: 09/13 0701 - 09/14 0700 In: 240 [P.O.:240] Out: 0  Intake/Output this shift: No intake/output data recorded.  PE: General: pleasant, WD,overweight female who issitting upin NAD HEENT: Sclera are anicteric. PERRL. Ears and nose without any masses or lesions. Mouth is pink and moist Heart: regular, rate, and rhythm. Palpable radial and pedal pulses bilaterally Lungs: CTAB, no wheezes, rhonchi, or rales noted. Respiratory effort nonlabored Abd: soft,non-tender,distended,incision c/d/i with staples present, +BS MS: all 4 extremities are symmetrical with no cyanosis, clubbing, or edema. Skin: warm and dry with no masses, lesions, or rashes Neuro: Cranial nerves 2-12 grossly intact, sensation grossly intact throughout Psych: A&Ox3 with an appropriate affect.   Lab Results:  Recent Labs    05/09/20 0427 05/10/20 1344  WBC 8.0 10.7*  HGB 9.5* 10.6*  HCT 31.7* 36.4  PLT 73* 112*   BMET Recent Labs    05/09/20 0427 05/10/20 1344  NA 137 137  K 2.9* 4.3  CL 101 102  CO2 27 22  GLUCOSE 117* 178*  BUN 12 29*  CREATININE 1.32* 1.99*  CALCIUM 8.3* 8.7*   PT/INR No results for input(s): LABPROT, INR in the last 72 hours. CMP     Component Value Date/Time   NA 137 05/10/2020 1344   K 4.3 05/10/2020 1344   CL 102 05/10/2020 1344    CO2 22 05/10/2020 1344   GLUCOSE 178 (H) 05/10/2020 1344   BUN 29 (H) 05/10/2020 1344   CREATININE 1.99 (H) 05/10/2020 1344   CALCIUM 8.7 (L) 05/10/2020 1344   PROT 5.5 (L) 05/07/2020 0400   ALBUMIN 2.9 (L) 05/07/2020 0400   AST 44 (H) 05/07/2020 0400   ALT 37 05/07/2020 0400   ALKPHOS 77 05/07/2020 0400   BILITOT 1.4 (H) 05/07/2020 0400   GFRNONAA 24 (L) 05/10/2020 1344   GFRAA 28 (L) 05/10/2020 1344   Lipase  No results found for: LIPASE     Studies/Results: DG Chest 2 View  Result Date: 05/09/2020 CLINICAL DATA:  Fever today; underwent exploratory laparotomy and partial colectomy on 05/13/2020 EXAM: CHEST - 2 VIEW COMPARISON:  I 321 FINDINGS: Enlargement of cardiac silhouette with slight vascular congestion. Mediastinal contours normal. Bibasilar pleural effusions and atelectasis greater on RIGHT. Elevation of RIGHT diaphragm. Free air under the RIGHT diaphragm and outlining a bowel loop in the mid abdomen, likely related to exploratory laparotomy 3 days ago. Upper lungs clear. No pneumothorax or acute osseous findings. IMPRESSION: Bibasilar effusions and atelectasis. Elevated RIGHT diaphragm with free intraperitoneal air likely related to recent laparotomy 3 days ago. Electronically Signed   By: Lavonia Dana M.D.   On: 05/09/2020 15:48    Anti-infectives: Anti-infectives (From admission, onward)   Start     Dose/Rate Route Frequency Ordered Stop   05/12/2020 0600  cefoTEtan (CEFOTAN) 2 g in sodium chloride 0.9 % 100 mL IVPB  2 g 200 mL/hr over 30 Minutes Intravenous On call to O.R. 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

## 2020-05-11 NOTE — Progress Notes (Signed)
PROGRESS NOTE    Regina Richards  LEX:[id number] DOB: [birth year] DOA: 05/03/2020 PCP: Patient, No Pcp Per    Chief Complaint  Patient presents with  . Loss of Consciousness    Brief Narrative:   74 year old lady with prior history of essential hypertension, anxiety, anemia brought in for exertional chest pain secondary to severe anemia.  Hemoglobin was 2.9 on admission currently around 8.  FOBT was negative in the ED.  GI was consulted patient underwent colonoscopy showing an obstructing sigmoid colon mass .  Oncology and general surgery consulted for further recommendations. The biopsy of the sigmoid mass show Tubulovillous adenoma, focally suspicious for invasive carcinoma. Pt underwent  left  hemicolectomy on 05/24/2020.  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 Post op, pt was started on clears and advanced to soft diet. No BM in the last 36 hours and abdomen appears distended. Hospital course  Further complicated by AKI post op. She was started on IV fluids, US RENAL AND UA ordered for further evaluation.     Assessment & Plan:   Principal Problem:   Symptomatic anemia Active Problems:   Essential hypertension, benign   Pancytopenia (HCC)   History of kidney stones   CKD (chronic kidney disease), stage III   Syncope and collapse   Bradycardia   Thrombocytopenia (HCC)   Iron deficiency anemia   Benign neoplasm of cecum   Benign neoplasm of transverse colon   Benign neoplasm of descending colon   Benign neoplasm of rectum   Colonic mass     Severe iron deficiency anemia probably secondary to sigmoid mass.  Patient was admitted with a hemoglobin of 2.9, s/p 3 units of PRBC transfusion and hemoglobin stable  Patient also underwent 1 dose of IV iron on 05/01/2020  Hemoglobin stable between 9 to 10.    Sigmoid colon mass Seen at the time of colonoscopy by GI, biopsied and results show Tubulovillous adenoma, focally suspicious for invasive carcinoma    General surgery consulted for evaluation of resection/sigmoidectomy.  . she underwent left hemicolectomy on 05/22/2020, pathology positive for invasive adeno carcinoma.  Post op she was started on clears and advanced diet as tolerated. Currently on soft diet.  Pt reports no BM in the last 36 hours and abdomen is slightly distended, and she reports some nausea.ABD film does not show any obstruction. Continue to monitor.  Recommend outpatient follow up with Dr Vianne Bulls oncologist once she is discharged.    Mild to moderate thrombocytopenia Probably secondary to cirrhosis.  Platelet count of 112000. No bleeding seen.    Hypokalemia;  Replaced.   Hypomagnesemia:  Replaced.   Enterococcus UTI:  Completed the course of amoxicillin.    Acute kidney Injury: Post op intravascular volume depletion.  Hydrated, check urine output, US renal , UA and urine sodium and creatinine.  Bicarb wnl.    Liver cirrhosis:  Ammonia level is 30, mild to mod thrombocytopenia, and elevated AST.  Recommend outpatient follow up with GI.  She will need EGD at some point to evaluate for portal hypertension, to be scheduled as outpatient.    Low grade fever of 100.3  On 05/08/20 CXR shows bilateral pleural effusions and atelectasis,  UA is pending.  Blood cultures ordered and negative so far.  IS ordered.   DVT prophylaxis: heparin Code Status: FULL CODE.  Family Communication: none at bedside.  Disposition:   Status is: Inpatient  Remains inpatient appropriate because:Ongoing diagnostic testing needed not appropriate for outpatient work up  and Inpatient level of care appropriate due to severity of illness, AKI .   Dispo: The patient is from: Home              Anticipated d/c is to: Home              Anticipated d/c date is: 1 day              Patient currently is not medically stable to d/c.       Consultants:   Gastroenterology Hematology General surgery Cardiology.    Procedures:  Colonoscopy  Left hemi colectomy.    Antimicrobials: Amoxicillin since 05/05/20   Subjective: No BM yesterday, today.  Slightly nauseated.  RN reports pt dropped her sats at night and she is on 1 lt of [place] Oxygen and her sats are 99%  Objective: Vitals:   05/10/20 2010 05/10/20 2300 05/11/20 0300 05/11/20 0731  BP: 124/62 118/65 130/68   Pulse: 90 84 87   Resp: 18 14 16    Temp: 97.8 F (36.6 C) 98.4 F (36.9 C) 97.8 F (36.6 C) 98.4 F (36.9 C)  TempSrc: Oral Oral Oral Oral  SpO2:  100% 96%   Weight:   78.9 kg   Height:        Intake/Output Summary (Last 24 hours) at 05/11/2020 1040 Last data filed at 05/11/2020 1000 Gross per 24 hour  Intake 315 ml  Output 0 ml  Net 315 ml   Filed Weights   05/09/20 0442 05/10/20 0500 05/11/20 0300  Weight: 75.9 kg 78.9 kg 78.9 kg    Examination:  General exam: alert, oriented, not in distress.   Respiratory system: Diminished air entry at bases, no wheezing or rhonchi no tachypnea Cardiovascular system: S1-S2 heard, regular rate rhythm, no JVD, no pedal edema Gastrointestinal system: Abdomen is soft, distended, mild generalized tenderness, bowel sounds hyperactive Central nervous system: Alert and oriented to place and person, grossly nonfocal Extremities: No pedal edema Skin: No rashes seen Psychiatry: Mood is appropriate   Data Reviewed: I have personally reviewed following labs and imaging studies  CBC: Recent Labs  Lab 05/01/2020 0025 05/22/2020 1620 05/07/20 0400 05/09/20 0427 05/10/20 1344  WBC 5.5 7.3 14.8* 8.0 10.7*  NEUTROABS 3.6  --   --   --  9.4*  HGB 9.4* 10.0* 9.8* 9.5* 10.6*  HCT 32.5* 34.5* 34.0* 31.7* 36.4  MCV 89.5 89.8 92.6 90.8 92.9  PLT 87* 102* 110* 73* 112*    Basic Metabolic Panel: Recent Labs  Lab 05/10/2020 0025 05/24/2020 1620 05/07/20 0400 05/09/20 0427 05/09/20 1519 05/10/20 1344  NA 141 143 143 137  --  137  K 3.5 3.2* 3.7 2.9*  --  4.3  CL 103 107 106 101  --  102  CO2 30 28 28 27    --  22  GLUCOSE 140* 130* 121* 117*  --  178*  BUN <5* <5* 8 12  --  29*  CREATININE 0.93 0.83 1.25* 1.32*  --  1.99*  CALCIUM 9.0 8.5* 8.8* 8.3*  --  8.7*  MG  --   --   --   --  1.4* 1.9  PHOS  --   --   --   --   --  4.3    GFR: Estimated Creatinine Clearance: 25.6 mL/min (A) (by C-G formula based on SCr of 1.99 mg/dL (H)).  Liver Function Tests: Recent Labs  Lab 05/04/20 1427 04/30/2020 1620 05/07/20 0400  AST 36 48* 44*  ALT  31 36 37  ALKPHOS 92 83 77  BILITOT 1.4* 1.7* 1.4*  PROT 5.8* 5.7* 5.5*  ALBUMIN 3.0* 3.1* 2.9*    CBG: No results for input(s): GLUCAP in the last 168 hours.   Recent Results (from the past 240 hour(s))  Culture, Urine     Status: Abnormal   Collection Time: 05/02/20 10:15 AM   Specimen: Urine, Random  Result Value Ref Range Status   Specimen Description URINE, RANDOM  Final   Special Requests   Final    NONE Performed at [facility], [address] [place], [place] [address]    Culture ENTEROCOCCUS FAECALIS (A)  Final   Report Status 05/04/2020 FINAL  Final   Organism ID, Bacteria ENTEROCOCCUS FAECALIS  Final      Susceptibility   Enterococcus faecalis - MIC*    AMPICILLIN <=2 SENSITIVE Sensitive     NITROFURANTOIN <=16 SENSITIVE Sensitive     VANCOMYCIN 2 SENSITIVE Sensitive     * ENTEROCOCCUS FAECALIS  Culture, blood (Routine X 2) w Reflex to ID Panel     Status: None (Preliminary result)   Collection Time: 05/09/20  3:18 PM   Specimen: BLOOD RIGHT HAND  Result Value Ref Range Status   Specimen Description BLOOD RIGHT HAND  Final   Special Requests   Final    BOTTLES DRAWN AEROBIC ONLY Blood Culture results may not be optimal due to an inadequate volume of blood received in culture bottles   Culture   Final    NO GROWTH 2 DAYS Performed at [facility], [place] [address], [place], [place] [address]    Report Status PENDING  Incomplete  Culture, blood (Routine X 2) w Reflex to ID Panel     Status: None (Preliminary  result)   Collection Time: 05/09/20  3:24 PM   Specimen: BLOOD RIGHT HAND  Result Value Ref Range Status   Specimen Description BLOOD RIGHT HAND  Final   Special Requests   Final    BOTTLES DRAWN AEROBIC ONLY Blood Culture results may not be optimal due to an inadequate volume of blood received in culture bottles   Culture   Final    NO GROWTH 2 DAYS Performed at [facility], [place] [address], [place],  [address]    Report Status PENDING  Incomplete         Radiology Studies: DG Chest 2 View  Result Date: 05/09/2020 CLINICAL DATA:  Fever today; underwent exploratory laparotomy and partial colectomy on 05/21/2020 EXAM: CHEST - 2 VIEW COMPARISON:  I 321 FINDINGS: Enlargement of cardiac silhouette with slight vascular congestion. Mediastinal contours normal. Bibasilar pleural effusions and atelectasis greater on RIGHT. Elevation of RIGHT diaphragm. Free air under the RIGHT diaphragm and outlining a bowel loop in the mid abdomen, likely related to exploratory laparotomy 3 days ago. Upper lungs clear. No pneumothorax or acute osseous findings. IMPRESSION: Bibasilar effusions and atelectasis. Elevated RIGHT diaphragm with free intraperitoneal air likely related to recent laparotomy 3 days ago. Electronically Signed   By: Lavonia Dana M.D.   On: 05/09/2020 15:48   DG Abd Portable 1V  Result Date: 05/11/2020 CLINICAL DATA:  Abdominal distension EXAM: PORTABLE ABDOMEN - 1 VIEW COMPARISON:  Abdominal CT from 8 days ago FINDINGS: Artifact from EKG leads. Gas distended structure in the central abdomen correlates with stomach by prior CT. No gas dilated small or large bowel seen. No concerning mass effect or gas collection. Limited study due to portable technique  and soft tissue attenuation. IMPRESSION: Nonobstructive bowel gas pattern. No adverse change since CT 05/05/2020 Electronically Signed   By: Monte Fantasia M.D.   On: 05/11/2020 08:57        Scheduled Meds: . (feeding  supplement) PROSource Plus  30 mL Oral BID BM  . sodium chloride   Intravenous Once  . Chlorhexidine Gluconate Cloth  6 each Topical Daily  . feeding supplement (ENSURE ENLIVE)  237 mL Oral BID BM  . heparin injection (subcutaneous)  5,000 Units Subcutaneous Q8H  . methocarbamol  500 mg Oral TID  . polyethylene glycol  17 g Oral Daily   Continuous Infusions: . sodium chloride    . sodium chloride 75 mL/hr at 05/11/20 0907     LOS: 11 days       Hosie Poisson, MD [facility]   To contact the attending provider between 7A-7P or the covering provider during after hours 7P-7A, please log into the web site [email or web address] and access using universal Success password for that web site. If you do not have the password, please call the hospital operator.  05/11/2020, 10:40 AM

## 2020-05-11 NOTE — Discharge Instructions (Signed)
[facility], [place] [phone]  OPEN ABDOMINAL SURGERY: POST OP INSTRUCTIONS  Always review your discharge instruction sheet given to you by the facility where your surgery was performed.  IF YOU HAVE DISABILITY OR FAMILY LEAVE FORMS, YOU MUST BRING THEM TO THE OFFICE FOR PROCESSING.  PLEASE DO NOT GIVE THEM TO YOUR DOCTOR.  1. A prescription for pain medication may be given to you upon discharge.  Take your pain medication as prescribed, if needed.  If narcotic pain medicine is not needed, then you may take acetaminophen (Tylenol) or ibuprofen (Advil) as needed. 2. Take your usually prescribed medications unless otherwise directed. 3. If you need a refill on your pain medication, please contact your pharmacy. They will contact our office to request authorization.  Prescriptions will not be filled after 5pm or on week-ends. 4. You should follow a light diet the first few days after arrival home, such as soup and crackers, pudding, etc.unless your doctor has advised otherwise. A high-fiber, low fat diet can be resumed as tolerated.   Be sure to include lots of fluids daily. Most patients will experience some swelling and bruising on the chest and neck area.  Ice packs will help.  Swelling and bruising can take several days to resolve 5. Most patients will experience some swelling and bruising in the area of the incision. Ice pack will help. Swelling and bruising can take several days to resolve..  6. It is common to experience some constipation if taking pain medication after surgery.  Increasing fluid intake and taking a stool softener will usually help or prevent this problem from occurring.  A mild laxative (Milk of Magnesia or Miralax) should be taken according to package directions if there are no bowel movements after 48 hours. 7.  You may have steri-strips (small skin tapes) in place directly over the incision.  These strips should be left on the skin for 7-10 days.  If your  surgeon used skin glue on the incision, you may shower in 24 hours.  The glue will flake off over the next 2-3 weeks.  Any sutures or staples will be removed at the office during your follow-up visit. You may find that a light gauze bandage over your incision may keep your staples from being rubbed or pulled. You may shower and replace the bandage daily. 8. ACTIVITIES:  You may resume regular (light) daily activities beginning the next day--such as daily self-care, walking, climbing stairs--gradually increasing activities as tolerated.  You may have sexual intercourse when it is comfortable.  Refrain from any heavy lifting or straining until approved by your doctor. a. You may drive when you no longer are taking prescription pain medication, you can comfortably wear a seatbelt, and you can safely maneuver your car and apply brakes  9. You should see your doctor in the office for a follow-up appointment approximately two weeks after your surgery.  Make sure that you call for this appointment within a day or two after you arrive home to insure a convenient appointment time.   WHEN TO CALL YOUR DOCTOR: 1. Fever over 101.0 2. Inability to urinate 3. Nausea and/or vomiting 4. Extreme swelling or bruising 5. Continued bleeding from incision. 6. Increased pain, redness, or drainage from the incision. 7. Difficulty swallowing or breathing 8. Muscle cramping or spasms. 9. Numbness or tingling in hands or feet or around lips.  The clinic staff is available to answer your questions during regular business hours.  Please dont hesitate to call and ask to speak to one of the nurses if you have concerns.  For further questions, please visit [email or web address]   Cirrhosis  Cirrhosis is long-term (chronic) liver injury. The liver is the body's largest internal organ, and it performs many functions. It converts food into energy, removes toxic material from the blood, makes important proteins, and  absorbs necessary vitamins from food. In cirrhosis, healthy liver cells are replaced by scar tissue. This prevents blood from flowing through the liver, making it difficult for the liver to function. Scarring of the liver cannot be reversed, but treatment can prevent it from getting worse. What are the causes? Common causes of this condition are hepatitis C and long-term alcohol abuse. Other causes include:  Nonalcoholic fatty liver disease. This happens when fat is deposited in the liver by causes other than alcohol.  Hepatitis B infection.  Autoimmune hepatitis. In this condition, the body's defense system (immune system) mistakenly attacks the liver cells, causing irritation and swelling (inflammation).  Diseases that cause blockage of ducts inside the liver.  Inherited liver diseases, such as hemochromatosis. This is one of the most common inherited liver diseases. In this disease, deposits of iron collect in the liver and other organs.  Reactions to certain long-term medicines, such as amiodarone, a heart medicine.  Parasitic infections. These include schistosomiasis, which is caused by a flatworm.  Long-term contact to certain toxins. These toxins include certain organic solvents, such as toluene and chloroform. What increases the risk? You are more likely to develop this condition if:  You have certain types of viral hepatitis.  You abuse alcohol, especially if you are female.  You are overweight.  You share needles.  You have unprotected sex with someone who has viral hepatitis. What are the signs or symptoms? You may not have any signs and symptoms at first. Symptoms may not develop until the damage to your liver starts to get worse. Early symptoms may include:  Weakness and tiredness (fatigue).  Changes in sleep patterns or having trouble sleeping.  Itchiness.  Tenderness in the right-upper part of your abdomen.  Weight loss and muscle loss.  Nausea.  Loss of  appetite.  Appearance of tiny blood vessels under the skin. Later symptoms may include:  Fatigue or weakness that is getting worse.  Yellow skin and eyes (jaundice).  Buildup of fluid in the abdomen (ascites). You may notice that your clothes are tight around your waist.  Weight gain.  Swelling of the feet and ankles (edema).  Trouble breathing.  Easy bruising and bleeding.  Vomiting blood.  Black or bloody stool.  Mental confusion. How is this diagnosed? Your health care provider may suspect cirrhosis based on your symptoms and medical history, especially if you have other medical conditions or a history of alcohol abuse. Your health care provider will do a physical exam to feel your liver and to check for signs of cirrhosis. He or she may perform other tests, including:  Blood tests to check: ? For hepatitis B or C. ? Kidney function. ? Liver function.  Imaging tests such as: ? MRI or CT scan to look for changes seen in advanced cirrhosis. ? Ultrasound to see if normal liver tissue is being replaced by scar tissue.  A procedure in which a long needle is used to take a sample of liver tissue to be checked in a lab (biopsy). Liver biopsy can confirm the diagnosis of cirrhosis. How is this treated? Treatment for this condition  depends on how damaged your liver is and what caused the damage. It may include treating the symptoms of cirrhosis, or treating the underlying causes in order to slow the damage. Treatment may include:  Making lifestyle changes, such as: ? Eating a healthy diet. You may need to work with your health care provider or a diet and nutrition specialist (dietitian) to develop an eating plan. ? Restricting salt intake. ? Maintaining a healthy weight. ? Not abusing drugs or alcohol.  Taking medicines to: ? Treat liver infections or other infections. ? Control itching. ? Reduce fluid buildup. ? Reduce certain blood toxins. ? Reduce risk of bleeding from  enlarged blood vessels in the stomach or esophagus (varices).  Liver transplant. In this procedure, a liver from a donor is used to replace your diseased liver. This is done if cirrhosis has caused liver failure. Other treatments and procedures may be done depending on the problems that you get from cirrhosis. Common problems include liver-related kidney failure (hepatorenal syndrome). Follow these instructions at home:   Take medicines only as told by your health care provider. Do not use medicines that are toxic to your liver. Ask your health care provider before taking any new medicines, including over-the-counter medicines.  Rest as needed.  Eat a well-balanced diet. Ask your health care provider or dietitian for more information.  Limit your salt or water intake, if your health care provider asks you to do this.  Do not drink alcohol. This is especially important if you are taking acetaminophen.  Keep all follow-up visits as told by your health care provider. This is important. Contact a health care provider if you:  Have fatigue or weakness that is getting worse.  Develop swelling of the hands, feet, legs, or face.  Have a fever.  Develop loss of appetite.  Have nausea or vomiting.  Develop jaundice.  Develop easy bruising or bleeding. Get help right away if you:  Vomit bright red blood or a material that looks like coffee grounds.  Have blood in your stools.  Notice that your stools appear black and tarry.  Become confused.  Have chest pain or trouble breathing. Summary  Cirrhosis is chronic liver injury. Liver damage cannot be reversed. Common causes are hepatitis C and long-term alcohol abuse.  Tests used to diagnose cirrhosis include blood tests, imaging tests, and liver biopsy.  Treatment for this condition involves treating the underlying cause. Avoid alcohol, drugs, salt, and medicines that may damage your liver.  Contact your health care provider if  you develop ascites, edema, jaundice, fever, nausea or vomiting, easy bruising or bleeding, or worsening fatigue. This information is not intended to replace advice given to you by your health care provider. Make sure you discuss any questions you have with your health care provider. Document Revised: 12/04/2018 Document Reviewed: 07/04/2017 Elsevier Patient Education  Regina Richards.

## 2020-05-11 NOTE — Progress Notes (Signed)
MD made aware about the ammonia level , with order.

## 2020-05-11 NOTE — Progress Notes (Signed)
As per surgical PA rectal pouch has to be removed. Pt refused since she is getting lactulose for high ammonia level  ,Expecting to move her bowel every now and then.

## 2020-05-12 LAB — CBC
HCT: 35.7 % — ABNORMAL LOW (ref 36.0–46.0)
Hemoglobin: 10.7 g/dL — ABNORMAL LOW (ref 12.0–15.0)
MCH: 26.6 pg (ref 26.0–34.0)
MCHC: 30 g/dL (ref 30.0–36.0)
MCV: 88.6 fL (ref 80.0–100.0)
Platelets: 190 10*3/uL (ref 150–400)
RBC: 4.03 MIL/uL (ref 3.87–5.11)
RDW: 26.8 % — ABNORMAL HIGH (ref 11.5–15.5)
WBC: 14.1 10*3/uL — ABNORMAL HIGH (ref 4.0–10.5)
nRBC: 0 % (ref 0.0–0.2)

## 2020-05-12 LAB — BASIC METABOLIC PANEL
Anion gap: 13 (ref 5–15)
BUN: 43 mg/dL — ABNORMAL HIGH (ref 8–23)
CO2: 18 mmol/L — ABNORMAL LOW (ref 22–32)
Calcium: 8.7 mg/dL — ABNORMAL LOW (ref 8.9–10.3)
Chloride: 97 mmol/L — ABNORMAL LOW (ref 98–111)
Creatinine, Ser: 1.57 mg/dL — ABNORMAL HIGH (ref 0.44–1.00)
GFR calc Af Amer: 38 mL/min — ABNORMAL LOW (ref >60)
GFR calc non Af Amer: 32 mL/min — ABNORMAL LOW (ref >60)
Glucose, Bld: 123 mg/dL — ABNORMAL HIGH (ref 70–99)
Potassium: 3.6 mmol/L (ref 3.5–5.1)
Sodium: 128 mmol/L — ABNORMAL LOW (ref 135–145)

## 2020-05-12 MED ORDER — OXYCODONE HCL 5 MG PO TABS
5.0000 mg | ORAL_TABLET | Freq: Four times a day (QID) | ORAL | 0 refills | Status: AC | PRN
Start: 2020-05-12 — End: ?

## 2020-05-12 MED ORDER — ACETAMINOPHEN 325 MG PO TABS: 650.0000 mg | ORAL_TABLET | Freq: Four times a day (QID) | ORAL | Status: AC | PRN

## 2020-05-12 MED ORDER — GERHARDT'S BUTT CREAM
TOPICAL_CREAM | Freq: Two times a day (BID) | CUTANEOUS | Status: DC
  Administered 2020-05-12 – 2020-05-21 (×5): 1 via TOPICAL
  Filled 2020-05-12 (×3): qty 1

## 2020-05-12 MED ORDER — METHOCARBAMOL 500 MG PO TABS: 500.0000 mg | ORAL_TABLET | Freq: Three times a day (TID) | ORAL | 0 refills | Status: AC | PRN

## 2020-05-12 NOTE — Progress Notes (Signed)
[facility] Progress Note  Patient: Regina Richards    SAY:[id number]  DOA: 05/20/2020     Date of Service: the patient was seen and examined on 05/12/2020  Brief hospital course: Past medical history of HTN, anxiety, anemia.  Presents with severe anemia found to have colon cancer SP small bowel resection. Currently plan is monitor renal function.  Assessment and Plan: 1.  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 Found to have a colonic mass at the time of colonoscopy. Biopsies were performed concerning for carcinoma. Underwent left hemicolectomy on 05/10/2020. Pathology positive for invasive adenocarcinoma. Currently tolerating soft diet. Abdomen remains significantly distended. There is some redness around the surgical's wound.  Surgery evaluated patient currently stable from discharge from their perspective. Outpatient follow-up with medical oncology as well as surgery.  2.  Pancytopenia. Severe iron deficiency anemia. Mild thrombocytopenia. SP blood transfusion. SP Feraheme infusion Outpatient follow-up with hematology.  3.  Hypokalemia.  Hypomagnesemia. Currently replaced.  4.  Acute kidney injury. Renal function significantly worsened from baseline. Recheck today shows improvement. But not back to baseline. Ultrasound renal negative for Monitor.  5.  Cirrhosis of the liver. Seen on CT scan as well as on biopsy. Outpatient follow-up with GI.  6.  Pedal edema From postoperative volume overload. SCDs. Echocardiogram shows preserved EF.  7.  Obesity  Body mass index is 31.41 kg/m.  Nutrition Problem: Inadequate oral intake Etiology: altered GI function Interventions: Interventions: Boost Breeze, Prostat      Diet: Regular diet DVT Prophylaxis:   heparin injection 5,000 Units Start: 05/08/20 0945 SCDs Start: 05/02/2020 1738    Advance goals of care discussion: Full code  Family Communication: family was present at bedside, at the time of  interview.  The pt provided permission to discuss medical plan with the family. Opportunity was given to ask question and all questions were answered satisfactorily.   Disposition:  Status is: Inpatient  Remains inpatient appropriate because:Inpatient level of care appropriate due to severity of illness   Dispo: The patient is from: Home              Anticipated d/c is to: Home              Anticipated d/c date is: 1 day              Patient currently is not medically stable to d/c.  Subjective: No nausea no vomiting.  No fever no chills.  No chest pain.  No abdomen pain.  No diarrhea.  Physical Exam:  General: Appear in mild distress, no Rash; Oral Mucosa Clear, moist. no Abnormal Neck Mass Or lumps, Conjunctiva normal  Cardiovascular: S1 and S2 Present, no Murmur, Respiratory: good respiratory effort, Bilateral Air entry present and CTA, no Crackles, no wheezes Abdomen: Bowel Sound present, Soft and mild tenderness Extremities: no Pedal edema Neurology: alert and oriented to time, place, and person affect appropriate. no new focal deficit Gait not checked due to patient safety concerns     Vitals:   05/12/20 1100 05/12/20 1141 05/12/20 1603 05/12/20 1923  BP: (!) 153/78   (!) 156/81  Pulse:  90 90 96  Resp: 18  (!) 8 17  Temp: 98.3 F (36.8 C) 98.5 F (36.9 C) 98.5 F (36.9 C) 97.8 F (36.6 C)  TempSrc: Oral Oral Oral Oral  SpO2:  91%    Weight:      Height:        Intake/Output Summary (Last 24 hours) at 05/12/2020  2007 Last data filed at 05/12/2020 1900 Gross per 24 hour  Intake 1100 ml  Output --  Net 1100 ml   Filed Weights   05/10/20 0500 05/11/20 0300 05/12/20 0322  Weight: 78.9 kg 78.9 kg 83 kg    Data Reviewed: I have personally reviewed and interpreted daily labs, tele strips, imagings as discussed above. I reviewed all nursing notes, pharmacy notes, vitals, pertinent old records I have discussed plan of care as described above with RN and  patient/family.  CBC: Recent Labs  Lab 05/14/2020 0025 05/07/2020 0025 04/29/2020 1620 05/07/20 0400 05/09/20 0427 05/10/20 1344 05/12/20 1819  WBC 5.5   < > 7.3 14.8* 8.0 10.7* 14.1*  NEUTROABS 3.6  --   --   --   --  9.4*  --   HGB 9.4*   < > 10.0* 9.8* 9.5* 10.6* 10.7*  HCT 32.5*   < > 34.5* 34.0* 31.7* 36.4 35.7*  MCV 89.5   < > 89.8 92.6 90.8 92.9 88.6  PLT 87*   < > 102* 110* 73* 112* 190   < > = values in this interval not displayed.   Basic Metabolic Panel: Recent Labs  Lab 05/21/2020 1620 05/07/20 0400 05/09/20 0427 05/09/20 1519 05/10/20 1344 05/12/20 1819  NA 143 143 137  --  137 128*  K 3.2* 3.7 2.9*  --  4.3 3.6  CL 107 106 101  --  102 97*  CO2 28 28 27   --  22 18*  GLUCOSE 130* 121* 117*  --  178* 123*  BUN <5* 8 12  --  29* 43*  CREATININE 0.83 1.25* 1.32*  --  1.99* 1.57*  CALCIUM 8.5* 8.8* 8.3*  --  8.7* 8.7*  MG  --   --   --  1.4* 1.9  --   PHOS  --   --   --   --  4.3  --     Studies: No results found.  Scheduled Meds: . (feeding supplement) PROSource Plus  30 mL Oral BID BM  . sodium chloride   Intravenous Once  . Chlorhexidine Gluconate Cloth  6 each Topical Daily  . feeding supplement (ENSURE ENLIVE)  237 mL Oral BID BM  . Gerhardt's butt cream   Topical BID  . heparin injection (subcutaneous)  5,000 Units Subcutaneous Q8H  . methocarbamol  500 mg Oral TID  . polyethylene glycol  17 g Oral Daily   Continuous Infusions: . sodium chloride    . sodium chloride 75 mL/hr at 05/11/20 2241   PRN Meds: Place/Maintain arterial line **AND** sodium chloride, acetaminophen **OR** acetaminophen, ondansetron **OR** ondansetron (ZOFRAN) IV, oxyCODONE  Time spent: 35 minutes  Author: Berle Mull, MD [facility] 05/12/2020 8:07 PM  To reach On-call, see care teams to locate the attending and reach out via [email or web address]. Between 7PM-7AM, please contact night-coverage If you still have difficulty reaching the attending provider, please page the  [facility] (Director on Call) for [facility] on amion for assistance.

## 2020-05-12 NOTE — Progress Notes (Signed)
Physical Therapy Treatment Patient Details Name: Regina Richards MRN: [id number] DOB: February 15, 1946 Today's Date: 05/12/2020    History of Present Illness Pt is 74 yo female with PMH including anxiety, HTN, and kidney stones.  Pt presented with [facility] for 3-4 weeks.  She was found to have a hgb of 2.9 in the ED.  Pt admitted with symptomatic anemia and pancytopenia.  Pt had  colonscopy which showed partially obstructing sigmoid colon mass that was biopsied.  Pt is s/p L hemicolectomy , mobilization of splenic flexure, and liver biopsy on 05/20/2020    PT Comments    Attempted to see pt for ambulation. Assisted pt to EOB and pt with liquid stool. Returned pt to supine and cleaned pt. Pt immediately had another liquid stool. Cleaned pt again and unable to get pt on bedpan before she again had liquid stool. Notified nurse tech. Unable to amb at this time due to constant liquid stool.    Follow Up Recommendations  Home health PT;Supervision/Assistance - 24 hour     Equipment Recommendations  None recommended by PT    Recommendations for Other Services       Precautions / Restrictions Precautions Precautions: Fall Restrictions Weight Bearing Restrictions: No    Mobility  Bed Mobility Overal bed mobility: Needs Assistance Bed Mobility: Supine to Sit;Sit to Supine;Rolling Rolling: Min assist   Supine to sit: HOB elevated;Min assist Sit to supine: Min assist;HOB elevated   General bed mobility comments: Assist to bring hips over to roll. Verbal cues to technique. Assist to bring legs off of bed, elevate trunk into sitting. Assist to bring legs back into bed returning to supine  Transfers                 General transfer comment: Unable to attempt due to constant liquid stools.   Ambulation/Gait             General Gait Details: Unable to attempt due to constant liquid stools.    Stairs             Wheelchair Mobility    Modified Rankin (Stroke Patients Only)        Balance                                            Cognition Arousal/Alertness: Awake/alert Behavior During Therapy: WFL for tasks assessed/performed Overall Cognitive Status: Impaired/Different from baseline Area of Impairment: Problem solving;Memory                     Memory: Decreased short-term memory       Problem Solving: Slow processing;Decreased initiation;Difficulty sequencing;Requires verbal cues;Requires tactile cues General Comments: cues for sequencing for mobility      Exercises      General Comments        Pertinent Vitals/Pain Pain Assessment: Faces Faces Pain Scale: Hurts a little bit Pain Location: abdoman Pain Descriptors / Indicators: Discomfort;Sore    Home Living                      Prior Function            PT Goals (current goals can now be found in the care plan section) Acute Rehab PT Goals Patient Stated Goal: return home Progress towards PT goals: Not progressing toward goals - comment (constant stools)  Frequency    Min 3X/week      PT Plan Current plan remains appropriate    Co-evaluation              AM-PAC PT "6 Clicks" Mobility   Outcome Measure  Help needed turning from your back to your side while in a flat bed without using bedrails?: A Little Help needed moving from lying on your back to sitting on the side of a flat bed without using bedrails?: A Little Help needed moving to and from a bed to a chair (including a wheelchair)?: A Little Help needed standing up from a chair using your arms (e.g., wheelchair or bedside chair)?: A Little Help needed to walk in hospital room?: A Little Help needed climbing 3-5 steps with a railing? : A Little 6 Click Score: 18    End of Session   Activity Tolerance: Treatment limited secondary to medical complications (Comment) (constant liquid stools) Patient left: with call bell/phone within reach;in bed;with bed alarm  set Nurse Communication: Other (comment) (Pt needs to be cleaned up and sheets changed) PT Visit Diagnosis: Other abnormalities of gait and mobility (R26.89)     Time: 4536-4680 PT Time Calculation (min) (ACUTE ONLY): 21 min  Charges:  $Therapeutic Activity: 8-22 mins                     [place] Pager [phone] Office [place] 05/12/2020, 11:22 AM

## 2020-05-12 NOTE — Plan of Care (Signed)

## 2020-05-12 NOTE — Care Management Important Message (Signed)
Important Message  Patient Details  Name: TAMIE MINTEER MRN: [id number] Date of Birth: April 05, 1946   Medicare Important Message Given:  Yes - Important Message mailed due to current National Emergency  Verbal consent obtained due to current National Emergency  Relationship to patient: Self Contact Name: Ryane Konieczny Call Date: 05/12/20  Time: [place] Phone: [phone] Outcome: No Answer/Busy Important Message mailed to: Patient address on file    Delorse Lek 05/12/2020, 2:04 PM

## 2020-05-12 NOTE — Progress Notes (Signed)
[facility] Progress Note  6 Days Post-Op  Subjective: Tolerating diet and having bowel function. NAEON. No other complaints.   Objective: Vital signs in last 24 hours: Temp:  [97.9 F (36.6 C)-98.9 F (37.2 C)] 98.5 F (36.9 C) (09/15 1141) Pulse Rate:  [89-96] 90 (09/15 1141) Resp:  [18-20] 18 (09/15 1100) BP: (116-153)/(63-78) 153/78 (09/15 1100) SpO2:  [91 %-100 %] 91 % (09/15 1141) Weight:  [83 kg] 83 kg (09/15 0322) Last BM Date: 05/12/20  Intake/Output from previous day: 09/14 0701 - 09/15 0700 In: 1547 [P.O.:797; I.V.:750] Out: -  Intake/Output this shift: Total I/O In: 575 [P.O.:275; I.V.:300] Out: -   PE: General: pleasant, WD,overweight female who issitting upin NAD HEENT: Sclera are anicteric. PERRL. Ears and nose without any masses or lesions. Mouth is pink and moist Heart: regular, rate, and rhythm. Palpable radial and pedal pulses bilaterally Lungs: CTAB, no wheezes, rhonchi, or rales noted. Respiratory effort nonlabored Abd: soft,non-tender,distended,incision c/d/i with staples present, +BS MS: all 4 extremities are symmetrical with no cyanosis, clubbing, or edema. Skin: warm and dry with no masses, lesions, or rashes Neuro: Cranial nerves 2-12 grossly intact, sensation grossly intact throughout Psych: A&Ox3 with an appropriate affect.   Lab Results:  Recent Labs    05/10/20 1344  WBC 10.7*  HGB 10.6*  HCT 36.4  PLT 112*   BMET Recent Labs    05/10/20 1344  NA 137  K 4.3  CL 102  CO2 22  GLUCOSE 178*  BUN 29*  CREATININE 1.99*  CALCIUM 8.7*   PT/INR No results for input(s): LABPROT, INR in the last 72 hours. CMP     Component Value Date/Time   NA 137 05/10/2020 1344   K 4.3 05/10/2020 1344   CL 102 05/10/2020 1344   CO2 22 05/10/2020 1344   GLUCOSE 178 (H) 05/10/2020 1344   BUN 29 (H) 05/10/2020 1344   CREATININE 1.99 (H) 05/10/2020 1344   CALCIUM 8.7 (L) 05/10/2020 1344   PROT 5.5 (L) 05/07/2020 0400    ALBUMIN 2.9 (L) 05/07/2020 0400   AST 44 (H) 05/07/2020 0400   ALT 37 05/07/2020 0400   ALKPHOS 77 05/07/2020 0400   BILITOT 1.4 (H) 05/07/2020 0400   GFRNONAA 24 (L) 05/10/2020 1344   GFRAA 28 (L) 05/10/2020 1344   Lipase  No results found for: LIPASE     Studies/Results: US RENAL  Result Date: 05/11/2020 CLINICAL DATA:  Acute kidney injury. EXAM: RENAL / URINARY TRACT ULTRASOUND COMPLETE COMPARISON:  CT 05/09/2020 FINDINGS: Right Kidney: Renal measurements: 10.9 x 5.9 x 6.5 cm = volume: 218 mL. Normal echogenicity. No hydronephrosis. Multiple small renal calculi, the largest measured at 9 mm. Left Kidney: Renal measurements: 9.2 x 5.9 x 3.9 cm = volume: 111 mL. Generalized volume loss. Multiple small calculi, the largest in the lower pole measuring 5-6 mm. No hydronephrosis. Bladder: Appears normal for degree of bladder distention. Other: Small amount of ascites is present. IMPRESSION: 1. No hydronephrosis. Multiple small bilateral renal calculi. 2. Left renal atrophy. 3. Small amount of ascites. Electronically Signed   By: Nelson Chimes M.D.   On: 05/11/2020 10:39   DG Abd Portable 1V  Result Date: 05/11/2020 CLINICAL DATA:  Abdominal distension EXAM: PORTABLE ABDOMEN - 1 VIEW COMPARISON:  Abdominal CT from 8 days ago FINDINGS: Artifact from EKG leads. Gas distended structure in the central abdomen correlates with stomach by prior CT. No gas dilated small or large bowel seen. No concerning mass effect or gas  collection. Limited study due to portable technique and soft tissue attenuation. IMPRESSION: Nonobstructive bowel gas pattern. No adverse change since CT 05/08/2020 Electronically Signed   By: Monte Fantasia M.D.   On: 05/11/2020 08:57    Anti-infectives: Anti-infectives (From admission, onward)   Start     Dose/Rate Route Frequency Ordered Stop   05/04/2020 0600  cefoTEtan (CEFOTAN) 2 g in sodium chloride 0.9 % 100 mL IVPB        2 g 200 mL/hr over 30 Minutes Intravenous On call  to O.R. 05/05/20 1533 05/01/2020 1106   05/05/20 1600  amoxicillin (AMOXIL) capsule 500 mg        500 mg Oral Every 12 hours 05/05/20 1447 05/10/20 2159       Assessment/Plan Anxiety Cirrhosis - Child's Pugh B based off of current labs.liver bx shows hepatic steatosis and cirrhosis, check ammonia level this AM Anemia -hgb10.6 yesterday, plts112 AKI - Cr 1.99 today from 1.32 yesterday, UOP not recorded   Colon mass S/P left hemicolectomy and liver biopsy9/9 Dr. 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

## 2020-05-13 ENCOUNTER — Inpatient Hospital Stay (HOSPITAL_COMMUNITY): Payer: Medicare Other

## 2020-05-13 LAB — HEPATIC FUNCTION PANEL
ALT: 31 U/L (ref 0–44)
AST: 57 U/L — ABNORMAL HIGH (ref 15–41)
Albumin: 1.9 g/dL — ABNORMAL LOW (ref 3.5–5.0)
Alkaline Phosphatase: 184 U/L — ABNORMAL HIGH (ref 38–126)
Bilirubin, Direct: 0.4 mg/dL — ABNORMAL HIGH (ref 0.0–0.2)
Indirect Bilirubin: 0.4 mg/dL (ref 0.3–0.9)
Total Bilirubin: 0.8 mg/dL (ref 0.3–1.2)
Total Protein: 5.4 g/dL — ABNORMAL LOW (ref 6.5–8.1)

## 2020-05-13 LAB — BASIC METABOLIC PANEL
Anion gap: 10 (ref 5–15)
BUN: 42 mg/dL — ABNORMAL HIGH (ref 8–23)
CO2: 20 mmol/L — ABNORMAL LOW (ref 22–32)
Calcium: 9.3 mg/dL (ref 8.9–10.3)
Chloride: 103 mmol/L (ref 98–111)
Creatinine, Ser: 1.54 mg/dL — ABNORMAL HIGH (ref 0.44–1.00)
GFR calc Af Amer: 38 mL/min — ABNORMAL LOW (ref >60)
GFR calc non Af Amer: 33 mL/min — ABNORMAL LOW (ref >60)
Glucose, Bld: 109 mg/dL — ABNORMAL HIGH (ref 70–99)
Potassium: 3.7 mmol/L (ref 3.5–5.1)
Sodium: 133 mmol/L — ABNORMAL LOW (ref 135–145)

## 2020-05-13 LAB — CBC
HCT: 37.7 % (ref 36.0–46.0)
Hemoglobin: 11.1 g/dL — ABNORMAL LOW (ref 12.0–15.0)
MCH: 26.3 pg (ref 26.0–34.0)
MCHC: 29.4 g/dL — ABNORMAL LOW (ref 30.0–36.0)
MCV: 89.3 fL (ref 80.0–100.0)
Platelets: 219 10*3/uL (ref 150–400)
RBC: 4.22 MIL/uL (ref 3.87–5.11)
RDW: 27 % — ABNORMAL HIGH (ref 11.5–15.5)
WBC: 14.6 10*3/uL — ABNORMAL HIGH (ref 4.0–10.5)
nRBC: 0 % (ref 0.0–0.2)

## 2020-05-13 MED ORDER — FUROSEMIDE 10 MG/ML IJ SOLN
20.0000 mg | Freq: Once | INTRAMUSCULAR | Status: AC
  Administered 2020-05-13: 20 mg via INTRAVENOUS
  Filled 2020-05-13: qty 2

## 2020-05-13 MED ORDER — FUROSEMIDE 10 MG/ML IJ SOLN
40.0000 mg | Freq: Once | INTRAMUSCULAR | Status: AC
  Administered 2020-05-13: 40 mg via INTRAVENOUS
  Filled 2020-05-13: qty 4

## 2020-05-13 MED ORDER — ALBUMIN HUMAN 25 % IV SOLN
25.0000 g | Freq: Once | INTRAVENOUS | Status: AC
  Administered 2020-05-13: 25 g via INTRAVENOUS
  Filled 2020-05-13: qty 100

## 2020-05-13 MED ORDER — PAROXETINE HCL 20 MG PO TABS
40.0000 mg | ORAL_TABLET | Freq: Every day | ORAL | Status: DC
  Administered 2020-05-14 – 2020-05-17 (×4): 40 mg via ORAL
  Filled 2020-05-13 (×4): qty 2

## 2020-05-13 NOTE — Progress Notes (Signed)
Physical Therapy Treatment Patient Details Name: Regina Richards MRN: [id number] DOB: Sep 19, 1945 Today's Date: 05/13/2020    History of Present Illness Pt is 74 yo female with PMH including anxiety, HTN, and kidney stones.  Pt presented with [facility] for 3-4 weeks.  She was found to have a hgb of 2.9 in the ED.  Pt admitted with symptomatic anemia and pancytopenia.  Pt had  colonscopy which showed partially obstructing sigmoid colon mass that was biopsied.  Pt is s/p L hemicolectomy , mobilization of splenic flexure, and liver biopsy on 04/30/2020    PT Comments    Pt received in bed. Pt declining hallway ambulation, stating "I don't think I can do that today." Agreeable to OOB to chair. She required mod assist bed mobility, min assist transfers, and min guard assist ambulation 5' with RW. SOB and increased RR noted at rest. Pt on RA. Resting SpO2 95% and desat to 90% during mobility. Pt in recliner with feet elevated at end of session.   Follow Up Recommendations  Home health PT;Supervision/Assistance - 24 hour     Equipment Recommendations  None recommended by PT    Recommendations for Other Services       Precautions / Restrictions Precautions Precautions: Fall    Mobility  Bed Mobility Overal bed mobility: Needs Assistance Bed Mobility: Supine to Sit     Supine to sit: HOB elevated;Mod assist     General bed mobility comments: increased time, cues for sequencing, +rail  Transfers Overall transfer level: Needs assistance Equipment used: Rolling walker (2 wheeled) Transfers: Sit to/from Stand Sit to Stand: Min assist         General transfer comment: cues for hand placement, assist to power up, increased time  Ambulation/Gait Ambulation/Gait assistance: Min guard Gait Distance (Feet): 5 Feet Assistive device: Rolling walker (2 wheeled) Gait Pattern/deviations: Step-through pattern;Decreased stride length;Trunk flexed Gait velocity: decreased   General Gait Details: Pt  declining hallway ambulation due to fatigue. Agreeable to to amb bed to recliner. SOB noted at rest. Resting SpO2 95% on RA. Desat to 90% during mobility.   Stairs             Wheelchair Mobility    Modified Rankin (Stroke Patients Only)       Balance Overall balance assessment: Needs assistance Sitting-balance support: No upper extremity supported;Feet supported Sitting balance-Leahy Scale: Good     Standing balance support: During functional activity;No upper extremity supported;Bilateral upper extremity supported   Standing balance comment: static stand without UE support, RW for amb                            Cognition Arousal/Alertness: Awake/alert Behavior During Therapy: WFL for tasks assessed/performed Overall Cognitive Status: Impaired/Different from baseline Area of Impairment: Problem solving;Memory                     Memory: Decreased short-term memory       Problem Solving: Slow processing;Difficulty sequencing;Decreased initiation;Requires verbal cues        Exercises      General Comments General comments (skin integrity, edema, etc.): distended abdomen. Unable to don abd binder.      Pertinent Vitals/Pain Pain Assessment: Faces Faces Pain Scale: Hurts a little bit Pain Location: abdomen Pain Descriptors / Indicators: Discomfort Pain Intervention(s): Monitored during session;Repositioned;Limited activity within patient's tolerance    Home Living  Prior Function            PT Goals (current goals can now be found in the care plan section) Acute Rehab PT Goals Patient Stated Goal: return home Progress towards PT goals: Progressing toward goals    Frequency    Min 3X/week      PT Plan Current plan remains appropriate    Co-evaluation              AM-PAC PT "6 Clicks" Mobility   Outcome Measure  Help needed turning from your back to your side while in a flat bed  without using bedrails?: A Little Help needed moving from lying on your back to sitting on the side of a flat bed without using bedrails?: A Little Help needed moving to and from a bed to a chair (including a wheelchair)?: A Little Help needed standing up from a chair using your arms (e.g., wheelchair or bedside chair)?: A Little Help needed to walk in hospital room?: A Little Help needed climbing 3-5 steps with a railing? : A Lot 6 Click Score: 17    End of Session Equipment Utilized During Treatment: Gait belt Activity Tolerance: Patient limited by fatigue Patient left: in chair;with call bell/phone within reach;with chair alarm set Nurse Communication: Mobility status PT Visit Diagnosis: Other abnormalities of gait and mobility (R26.89)     Time: 3762-8315 PT Time Calculation (min) (ACUTE ONLY): 20 min  Charges:  $Gait Training: 8-22 mins                     Lorrin Goodell, PT  Office # [phone] Pager [phone]    Lorriane Shire 05/13/2020, 10:09 AM

## 2020-05-13 NOTE — Plan of Care (Signed)

## 2020-05-13 NOTE — Progress Notes (Signed)
[facility] Progress Note  Patient: Regina Richards    OHY:[id number]  DOA: 04/29/2020     Date of Service: the patient was seen and examined on 05/13/2020  Brief hospital course: Past medical history of HTN, anxiety, anemia.  Presents with severe anemia found to have colon cancer SP small bowel resection. 04/30/2020 colonoscopy 05/20/2020 exploratory laparotomy Currently plan is monitor for improvement in volume status.  Assessment and Plan: 1. Invasive sigmoid colon cancer. Presents with GI bleed. Found to have a colonic mass at the time of colonoscopy. Biopsies were performed concerning for carcinoma. Underwent left hemicolectomy on 05/05/2020. Pathology positive for invasive adenocarcinoma.  Margins were negative.  No lymphocytic invasion.  No muscular invasion. Currently tolerating soft diet. Abdomen remains significantly distended. There is some redness around the surgical's wound.  Surgery evaluated patient currently stable from discharge from their perspective. Outpatient follow-up with medical oncology with Dr. Lorenso Courier as well as surgery.  2.  Pancytopenia. Severe iron deficiency anemia. Mild thrombocytopenia. SP blood transfusion. SP Feraheme infusion Outpatient follow-up with hematology with Dr. Lorenso Courier.  3.  Hypokalemia.  Hypomagnesemia. Currently replaced.  4.  Acute kidney injury. Renal function significantly worsened from baseline. Currently serum creatinine improving but not back to baseline. Ultrasound negative for any obstruction. Suspect now the patient is suffering from cardiorenal hemodynamics. We will give IV Lasix.  5.  Cirrhosis of the liver. Seen on CT scan as well as on biopsy. Outpatient follow-up with GI. Currently not decompensated although contributing to anemia as well as thrombocytopenia.  6.  Pedal edema Postoperative volume overload. HFpEF SCDs. Echocardiogram shows preserved EF. We will give IV albumin followed by IV  Lasix. Discontinue IV fluids.  7.  Obesity Diet modification recommended. Body mass index is 30.88 kg/m.  Nutrition Problem: Inadequate oral intake Etiology: altered GI function Interventions: Interventions: Boost Breeze, Prostat  Diet: Soft diet DVT Prophylaxis:   heparin injection 5,000 Units Start: 05/08/20 0945 SCDs Start: 04/29/2020 1738    Advance goals of care discussion: Full code  Family Communication: no family was present at bedside, at the time of interview.   Disposition:  Status is: Inpatient  Remains inpatient appropriate because:Inpatient level of care appropriate due to severity of illness   Dispo: The patient is from: Home              Anticipated d/c is to: Home              Anticipated d/c date is: 2 days              Patient currently is not medically stable to d/c.  Subjective: No acute complaint.  Reports orthopnea as well as PND.  No chest pain.  Continues to have abdominal pain.  Bowel movements are improving.  Physical Exam:  General: Appear in moderate distress, no Rash; Oral Mucosa Clear, moist. no Abnormal Neck Mass Or lumps, Conjunctiva normal  Cardiovascular: S1 and S2 Present, no Murmur, Respiratory: increased respiratory effort, Bilateral Air entry present and bilateral Crackles, no wheezes Abdomen: Bowel Sound present, Soft and no tenderness Extremities: bilateral  Pedal edema Neurology: alert and oriented to time, place, and person affect appropriate. no new focal deficit Gait not checked due to patient safety concerns  Vitals:   05/13/20 0054 05/13/20 0500 05/13/20 0537 05/13/20 0752  BP: (!) 154/82  (!) 141/90 (!) 140/95  Pulse: 99  (!) 111   Resp: 20 (!) 23 20 19   Temp: 98 F (36.7 C)  98 F (36.7  C) 97.8 F (36.6 C)  TempSrc: Oral  Oral Oral  SpO2:  (!) 50% 93% 94%  Weight:  81.6 kg    Height:        Intake/Output Summary (Last 24 hours) at 05/13/2020 1737 Last data filed at 05/13/2020 1300 Gross per 24 hour  Intake  375 ml  Output 250 ml  Net 125 ml   Filed Weights   05/11/20 0300 05/12/20 0322 05/13/20 0500  Weight: 78.9 kg 83 kg 81.6 kg    Data Reviewed: I have personally reviewed and interpreted daily labs, tele strips, imagings as discussed above. I reviewed all nursing notes, pharmacy notes, vitals, pertinent old records I have discussed plan of care as described above with RN and patient/family.  CBC: Recent Labs  Lab 05/07/20 0400 05/09/20 0427 05/10/20 1344 05/12/20 1819 05/13/20 0204  WBC 14.8* 8.0 10.7* 14.1* 14.6*  NEUTROABS  --   --  9.4*  --   --   HGB 9.8* 9.5* 10.6* 10.7* 11.1*  HCT 34.0* 31.7* 36.4 35.7* 37.7  MCV 92.6 90.8 92.9 88.6 89.3  PLT 110* 73* 112* 190 536   Basic Metabolic Panel: Recent Labs  Lab 05/07/20 0400 05/09/20 0427 05/09/20 1519 05/10/20 1344 05/12/20 1819 05/13/20 0204  NA 143 137  --  137 128* 133*  K 3.7 2.9*  --  4.3 3.6 3.7  CL 106 101  --  102 97* 103  CO2 28 27  --  22 18* 20*  GLUCOSE 121* 117*  --  178* 123* 109*  BUN 8 12  --  29* 43* 42*  CREATININE 1.25* 1.32*  --  1.99* 1.57* 1.54*  CALCIUM 8.8* 8.3*  --  8.7* 8.7* 9.3  MG  --   --  1.4* 1.9  --   --   PHOS  --   --   --  4.3  --   --     Studies: DG CHEST PORT 1 VIEW  Result Date: 05/13/2020 CLINICAL DATA:  Hypoxia EXAM: PORTABLE CHEST 1 VIEW COMPARISON:  None. FINDINGS: Low lung volumes with bibasilar atelectasis. Heart is normal size. No effusions or acute bony abnormality. IMPRESSION: Low lung volumes, bibasilar atelectasis. Electronically Signed   By: Rolm Baptise M.D.   On: 05/13/2020 08:32    Scheduled Meds: . (feeding supplement) PROSource Plus  30 mL Oral BID BM  . Chlorhexidine Gluconate Cloth  6 each Topical Daily  . feeding supplement (ENSURE ENLIVE)  237 mL Oral BID BM  . Gerhardt's butt cream   Topical BID  . heparin injection (subcutaneous)  5,000 Units Subcutaneous Q8H  . [START ON 05/14/2020] PARoxetine  40 mg Oral Daily  . polyethylene glycol  17 g Oral  Daily   Continuous Infusions: . sodium chloride Stopped (05/13/20 1300)   PRN Meds: Place/Maintain arterial line **AND** sodium chloride, acetaminophen **OR** acetaminophen, ondansetron **OR** ondansetron (ZOFRAN) IV, oxyCODONE  Time spent: 35 minutes  Author: Berle Mull, MD [facility] 05/13/2020 5:37 PM  To reach On-call, see care teams to locate the attending and reach out via [email or web address]. Between 7PM-7AM, please contact night-coverage If you still have difficulty reaching the attending provider, please page the [facility] (Director on Call) for [facility] on amion for assistance.

## 2020-05-14 LAB — BASIC METABOLIC PANEL
Anion gap: 11 (ref 5–15)
BUN: 55 mg/dL — ABNORMAL HIGH (ref 8–23)
CO2: 20 mmol/L — ABNORMAL LOW (ref 22–32)
Calcium: 9.3 mg/dL (ref 8.9–10.3)
Chloride: 98 mmol/L (ref 98–111)
Creatinine, Ser: 2.33 mg/dL — ABNORMAL HIGH (ref 0.44–1.00)
GFR calc Af Amer: 23 mL/min — ABNORMAL LOW (ref >60)
GFR calc non Af Amer: 20 mL/min — ABNORMAL LOW (ref >60)
Glucose, Bld: 117 mg/dL — ABNORMAL HIGH (ref 70–99)
Potassium: 4.2 mmol/L (ref 3.5–5.1)
Sodium: 129 mmol/L — ABNORMAL LOW (ref 135–145)

## 2020-05-14 LAB — CULTURE, BLOOD (ROUTINE X 2)
Culture: NO GROWTH
Culture: NO GROWTH

## 2020-05-14 LAB — CBC
HCT: 36.1 % (ref 36.0–46.0)
Hemoglobin: 10.7 g/dL — ABNORMAL LOW (ref 12.0–15.0)
MCH: 26 pg (ref 26.0–34.0)
MCHC: 29.6 g/dL — ABNORMAL LOW (ref 30.0–36.0)
MCV: 87.8 fL (ref 80.0–100.0)
Platelets: 246 10*3/uL (ref 150–400)
RBC: 4.11 MIL/uL (ref 3.87–5.11)
RDW: 26.6 % — ABNORMAL HIGH (ref 11.5–15.5)
WBC: 15.9 10*3/uL — ABNORMAL HIGH (ref 4.0–10.5)
nRBC: 0 % (ref 0.0–0.2)

## 2020-05-14 MED ORDER — ALBUMIN HUMAN 25 % IV SOLN
25.0000 g | Freq: Once | INTRAVENOUS | Status: AC
  Administered 2020-05-14: 25 g via INTRAVENOUS
  Filled 2020-05-14: qty 100

## 2020-05-14 NOTE — Plan of Care (Signed)
  Problem: Education: Goal: Knowledge of General Education information will improve Description: Including pain rating scale, medication(s)/side effects and non-pharmacologic comfort measures 05/14/2020 0747 by Shanon Ace, RN Outcome: Progressing 05/13/2020 1952 by Shanon Ace, RN Outcome: Progressing   Problem: Health Behavior/Discharge Planning: Goal: Ability to manage health-related needs will improve 05/14/2020 0747 by Shanon Ace, RN Outcome: Progressing 05/13/2020 1952 by Shanon Ace, RN Outcome: Progressing   Problem: Clinical Measurements: Goal: Ability to maintain clinical measurements within normal limits will improve 05/14/2020 0747 by Shanon Ace, RN Outcome: Progressing 05/13/2020 1952 by Shanon Ace, RN Outcome: Progressing Goal: Will remain free from infection 05/14/2020 0747 by Shanon Ace, RN Outcome: Progressing 05/13/2020 1952 by Shanon Ace, RN Outcome: Progressing Goal: Diagnostic test results will improve 05/14/2020 0747 by Shanon Ace, RN Outcome: Progressing 05/13/2020 1952 by Shanon Ace, RN Outcome: Progressing Goal: Respiratory complications will improve 05/14/2020 0747 by Shanon Ace, RN Outcome: Progressing 05/13/2020 1952 by Shanon Ace, RN Outcome: Progressing Goal: Cardiovascular complication will be avoided 05/14/2020 0747 by Shanon Ace, RN Outcome: Progressing 05/13/2020 1952 by Shanon Ace, RN Outcome: Progressing   Problem: Activity: Goal: Risk for activity intolerance will decrease 05/14/2020 0747 by Shanon Ace, RN Outcome: Progressing 05/13/2020 1952 by Shanon Ace, RN Outcome: Progressing   Problem: Nutrition: Goal: Adequate nutrition will be maintained 05/14/2020 0747 by Shanon Ace, RN Outcome: Progressing 05/13/2020 1952 by Shanon Ace, RN Outcome: Progressing   Problem: Coping: Goal: Level of anxiety will decrease 05/14/2020 0747 by Shanon Ace, RN Outcome: Progressing 05/13/2020 1952 by Shanon Ace, RN Outcome: Progressing   Problem: Elimination: Goal: Will not experience complications related to bowel motility 05/14/2020 0747 by Shanon Ace, RN Outcome: Progressing 05/13/2020 1952 by Shanon Ace, RN Outcome: Progressing Goal: Will not experience complications related to urinary retention 05/14/2020 0747 by Shanon Ace, RN Outcome: Progressing 05/13/2020 1952 by Shanon Ace, RN Outcome: Progressing   Problem: Pain Managment: Goal: General experience of comfort will improve 05/14/2020 0747 by Shanon Ace, RN Outcome: Progressing 05/13/2020 1952 by Shanon Ace, RN Outcome: Progressing   Problem: Safety: Goal: Ability to remain free from injury will improve 05/14/2020 0747 by Shanon Ace, RN Outcome: Progressing 05/13/2020 1952 by Shanon Ace, RN Outcome: Progressing   Problem: Skin Integrity: Goal: Risk for impaired skin integrity will decrease 05/14/2020 0747 by Shanon Ace, RN Outcome: Progressing 05/13/2020 1952 by Shanon Ace, RN Outcome: Progressing

## 2020-05-14 NOTE — Progress Notes (Signed)
[facility] PROGRESS NOTE    Regina Richards [facility]  UTM:[id number] DOB: 1946-08-09 DOA: 05/14/2020 PCP: Patient, No Pcp Per      Brief Narrative:  Regina Richards is a 74 y.o. F with HTN who presented with few weeks exertional fatigue, found to have severe anemia.  In the ER, Hgb 2.9, platelets 98K, WBC 3.9K.  CXR clear.  CT head unremarkable.  She was transfused and GI and hematology were consulted.   9/6 Colonoscopy -- colon mass found 9/9 exploratory laparotomy and resection and liver biopsy         Assessment & Plan:  Invasive colon cancer S/p partial colectomy 9/9  All nodes negative.  pT3, pN0 path staging. -Outpatient Oncology follow up will be needed   Cirrhosis No prior history.  Found incidentally on liver biopsy during ex lap.  Pancytopenia Severe iron deficiency anemia due to chronic blood loss Mild thrombocytopenia Hgb stable  Acute kidney failure Baseline Cr 0.7.  Over last 4 days, Cr has progressively worsened, with IV Lasix yesterday, it is now up to 2.3 today.  Renal US negative, UA bland  Possible hepatorenal syndrome? -Give albumin and repeat Cr tomorrow -If Cr worsening, will consult nephrology  CHF ruled out Diuresed and renal function worsening.  Obesity BMI 30.88  Anxiety -Continue Paxil   Hypoalbuminemia Due to cancer     Disposition: Status is: Inpatient  Remains inpatient appropriate because:worsening renal function, requires ongoing IV thearpies   Dispo: The patient is from: Home              Anticipated d/c is to: SNF              Anticipated d/c date is: 3 days              Patient currently is not medically stable to d/c.      The patient was admitted with severe anemia, found to be from a tumor.  This has been surgically removed, and from a surgical standpoint she is cleared for discharge.  However over the last several days, she has had still a worsening renal function, as well as being severely  fluid overloaded.  Diuresis was attempted, but her renal function is significantly worse today.  I will attempt albumin infusions but if worsening we will need to involve nephrology.            MDM: The below labs and imaging reports were reviewed and summarized above.  Medication management as above.    DVT prophylaxis: heparin injection 5,000 Units Start: 05/08/20 0945 SCDs Start: 05/26/2020 1738  Code Status: FULL Family Communication: Sister by phone          Subjective: Patient is weak and tired.  She has had frequent stools, no melena per nursing.  No vomiting.  No chest pain.  Objective: Vitals:   05/13/20 2312 05/14/20 0400 05/14/20 0647 05/14/20 0905  BP: (!) 144/69 131/67  139/76  Pulse: 92 88    Resp: 18 17  16   Temp: 98.1 F (36.7 C) 97.6 F (36.4 C)  (!) 96.7 F (35.9 C)  TempSrc: Oral Oral  Axillary  SpO2: 99% 100%    Weight:   82.7 kg   Height:        Intake/Output Summary (Last 24 hours) at 05/14/2020 1907 Last data filed at 05/14/2020 1706 Gross per 24 hour  Intake 0 ml  Output --  Net 0 ml   Filed Weights   05/12/20 0322 05/13/20  0500 05/14/20 0647  Weight: 83 kg 81.6 kg 82.7 kg    Examination: General appearance:  adult female, alert and in no acute distress.   HEENT: Anicteric, conjunctiva pink, lids and lashes normal. No nasal deformity, discharge, epistaxis.  Lips moist.   Skin: Warm and dry.  No jaundice.  No suspicious rashes or lesions. Cardiac: RRR, nl S1-S2, no murmurs appreciated.  Capillary refill is brisk.  JVP visible.  3+ pitting bilateral LE edema.  Radial  pulses 2+ and symmetric. Respiratory: Normal respiratory rate and rhythm.  CTAB without rales or wheezes.  Crackles at both bases. Abdomen: Abdomen soft.  No TTP or guarding.  Possible ascites, difficult to assess due to body habitus.   MSK: No deformities or effusions. Neuro: Awake and alert.  EOMI, moves all extremities. Speech fluent.    Psych: Sensorium intact  and responding to questions, attention normal. Affect normal.  Judgment and insight appear normal.    Data Reviewed: I have personally reviewed following labs and imaging studies:  CBC: Recent Labs  Lab 05/09/20 0427 05/10/20 1344 05/12/20 1819 05/13/20 0204 05/14/20 0016  WBC 8.0 10.7* 14.1* 14.6* 15.9*  NEUTROABS  --  9.4*  --   --   --   HGB 9.5* 10.6* 10.7* 11.1* 10.7*  HCT 31.7* 36.4 35.7* 37.7 36.1  MCV 90.8 92.9 88.6 89.3 87.8  PLT 73* 112* 190 219 390   Basic Metabolic Panel: Recent Labs  Lab 05/09/20 0427 05/09/20 1519 05/10/20 1344 05/12/20 1819 05/13/20 0204 05/14/20 0016  NA 137  --  137 128* 133* 129*  K 2.9*  --  4.3 3.6 3.7 4.2  CL 101  --  102 97* 103 98  CO2 27  --  22 18* 20* 20*  GLUCOSE 117*  --  178* 123* 109* 117*  BUN 12  --  29* 43* 42* 55*  CREATININE 1.32*  --  1.99* 1.57* 1.54* 2.33*  CALCIUM 8.3*  --  8.7* 8.7* 9.3 9.3  MG  --  1.4* 1.9  --   --   --   PHOS  --   --  4.3  --   --   --    GFR: Estimated Creatinine Clearance: 22.4 mL/min (A) (by C-G formula based on SCr of 2.33 mg/dL (H)). Liver Function Tests: Recent Labs  Lab 05/13/20 0749  AST 57*  ALT 31  ALKPHOS 184*  BILITOT 0.8  PROT 5.4*  ALBUMIN 1.9*   No results for input(s): LIPASE, AMYLASE in the last 168 hours. Recent Labs  Lab 05/11/20 1116  AMMONIA 39*   Coagulation Profile: No results for input(s): INR, PROTIME in the last 168 hours. Cardiac Enzymes: No results for input(s): CKTOTAL, CKMB, CKMBINDEX, TROPONINI in the last 168 hours. BNP (last 3 results) No results for input(s): PROBNP in the last 8760 hours. HbA1C: No results for input(s): HGBA1C in the last 72 hours. CBG: No results for input(s): GLUCAP in the last 168 hours. Lipid Profile: No results for input(s): CHOL, HDL, LDLCALC, TRIG, CHOLHDL, LDLDIRECT in the last 72 hours. Thyroid Function Tests: No results for input(s): TSH, T4TOTAL, FREET4, T3FREE, THYROIDAB in the last 72 hours. Anemia  Panel: No results for input(s): VITAMINB12, FOLATE, FERRITIN, TIBC, IRON, RETICCTPCT in the last 72 hours. Urine analysis:    Component Value Date/Time   COLORURINE YELLOW 05/11/2020 1445   APPEARANCEUR HAZY (A) 05/11/2020 1445   LABSPEC 1.015 05/11/2020 1445   PHURINE 5.0 05/11/2020 1445   GLUCOSEU NEGATIVE 05/11/2020  [place] 05/11/2020 [place] 05/11/2020 [place] 05/11/2020 1445   PROTEINUR NEGATIVE 05/11/2020 1445   NITRITE NEGATIVE 05/11/2020 1445   LEUKOCYTESUR NEGATIVE 05/11/2020 1445   Sepsis Labs: @LABRCNTIP (procalcitonin:4,lacticacidven:4)  ) Recent Results (from the past 240 hour(s))  Culture, blood (Routine X 2) w Reflex to ID Panel     Status: None   Collection Time: 05/09/20  3:18 PM   Specimen: BLOOD RIGHT HAND  Result Value Ref Range Status   Specimen Description BLOOD RIGHT HAND  Final   Special Requests   Final    BOTTLES DRAWN AEROBIC ONLY Blood Culture results may not be optimal due to an inadequate volume of blood received in culture bottles   Culture   Final    NO GROWTH 5 DAYS Performed at [facility], [place] [address] [place], [place] [address]    Report Status 05/14/2020 FINAL  Final  Culture, blood (Routine X 2) w Reflex to ID Panel     Status: None   Collection Time: 05/09/20  3:24 PM   Specimen: BLOOD RIGHT HAND  Result Value Ref Range Status   Specimen Description BLOOD RIGHT HAND  Final   Special Requests   Final    BOTTLES DRAWN AEROBIC ONLY Blood Culture results may not be optimal due to an inadequate volume of blood received in culture bottles   Culture   Final    NO GROWTH 5 DAYS Performed at [facility], [place] [address] [place],  [address]    Report Status 05/14/2020 FINAL  Final         Radiology Studies: DG CHEST PORT 1 VIEW  Result Date: 05/13/2020 CLINICAL DATA:  Hypoxia EXAM: PORTABLE CHEST 1 VIEW COMPARISON:  None. FINDINGS: Low lung volumes with  bibasilar atelectasis. Heart is normal size. No effusions or acute bony abnormality. IMPRESSION: Low lung volumes, bibasilar atelectasis. Electronically Signed   By: Rolm Baptise M.D.   On: 05/13/2020 08:32        Scheduled Meds: . (feeding supplement) PROSource Plus  30 mL Oral BID BM  . Chlorhexidine Gluconate Cloth  6 each Topical Daily  . feeding supplement (ENSURE ENLIVE)  237 mL Oral BID BM  . Gerhardt's butt cream   Topical BID  . heparin injection (subcutaneous)  5,000 Units Subcutaneous Q8H  . PARoxetine  40 mg Oral Daily  . polyethylene glycol  17 g Oral Daily   Continuous Infusions: . sodium chloride Stopped (05/13/20 1300)     LOS: 14 days    Time spent: 35 minutes    Edwin Dada, MD [facility] 05/14/2020, 7:07 PM     Please page though [place] or Epic secure chat:  For [employer], Adult nurse

## 2020-05-15 DIAGNOSIS — C189 Malignant neoplasm of colon, unspecified: Secondary | ICD-10-CM

## 2020-05-15 DIAGNOSIS — F419 Anxiety disorder, unspecified: Secondary | ICD-10-CM

## 2020-05-15 DIAGNOSIS — N179 Acute kidney failure, unspecified: Secondary | ICD-10-CM

## 2020-05-15 LAB — COMPREHENSIVE METABOLIC PANEL
ALT: 26 U/L (ref 0–44)
AST: 31 U/L (ref 15–41)
Albumin: 2.7 g/dL — ABNORMAL LOW (ref 3.5–5.0)
Alkaline Phosphatase: 106 U/L (ref 38–126)
Anion gap: 12 (ref 5–15)
BUN: 68 mg/dL — ABNORMAL HIGH (ref 8–23)
CO2: 20 mmol/L — ABNORMAL LOW (ref 22–32)
Calcium: 8.7 mg/dL — ABNORMAL LOW (ref 8.9–10.3)
Chloride: 98 mmol/L (ref 98–111)
Creatinine, Ser: 2.62 mg/dL — ABNORMAL HIGH (ref 0.44–1.00)
GFR calc Af Amer: 20 mL/min — ABNORMAL LOW (ref >60)
GFR calc non Af Amer: 17 mL/min — ABNORMAL LOW (ref >60)
Glucose, Bld: 94 mg/dL (ref 70–99)
Potassium: 4.2 mmol/L (ref 3.5–5.1)
Sodium: 130 mmol/L — ABNORMAL LOW (ref 135–145)
Total Bilirubin: 1.1 mg/dL (ref 0.3–1.2)
Total Protein: 5.6 g/dL — ABNORMAL LOW (ref 6.5–8.1)

## 2020-05-15 LAB — CBC
HCT: 31.4 % — ABNORMAL LOW (ref 36.0–46.0)
Hemoglobin: 9.6 g/dL — ABNORMAL LOW (ref 12.0–15.0)
MCH: 26.8 pg (ref 26.0–34.0)
MCHC: 30.6 g/dL (ref 30.0–36.0)
MCV: 87.7 fL (ref 80.0–100.0)
Platelets: 297 10*3/uL (ref 150–400)
RBC: 3.58 MIL/uL — ABNORMAL LOW (ref 3.87–5.11)
RDW: 26.8 % — ABNORMAL HIGH (ref 11.5–15.5)
WBC: 13 10*3/uL — ABNORMAL HIGH (ref 4.0–10.5)
nRBC: 0 % (ref 0.0–0.2)

## 2020-05-15 MED ORDER — PHENOL 1.4 % MT LIQD
1.0000 | OROMUCOSAL | Status: DC | PRN
  Administered 2020-05-15: 1 via OROMUCOSAL
  Filled 2020-05-15: qty 177

## 2020-05-15 MED ORDER — ALBUMIN HUMAN 25 % IV SOLN
25.0000 g | Freq: Three times a day (TID) | INTRAVENOUS | Status: AC
  Administered 2020-05-15 – 2020-05-17 (×6): 25 g via INTRAVENOUS
  Filled 2020-05-15 (×6): qty 100

## 2020-05-15 NOTE — Consult Note (Signed)
[name]  INPATIENT CONSULTATION  Reason for Consultation: AKI Requesting Provider: Dr. Wyonia Hough  HPI: Regina Richards is an 74 y.o. female with anxiety, HTN currently admitted for anemia who is seen for evaluation and management of AKI.    Pt presented 9/3 with several weeks of DOE, found to have Hb 2.9, MCV 75 (pancytopenia).   Transfused.  Colonoscopy with mass on 9/6, 9/9 ex lap, partial colectomy and liver biopsy.  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     Cr initially normal but starting 9/10 trending up 1.25 > 1.3 > 2 > 1.57 > 9/16 1.54 > 9/17 2.3 > 9/18 2.62.   UOP - really none documented.  Wt on presentation 78.9, today 82.2kg.  Says she's voiding without difficulty in the bed side commode. Has chronic LE edema,not worse here by her report.  Appetite is not robust and PO intake has been variable over the post surgical period.  9/6 CT contrast UA 9/14 bland, Urine sodium < 10.  Renal US 9/14 - R 10.9cm, L 9.2cm, normal echogen of R, L with generalized volume loss and several small calculi without hydro.  TTE with EF 60-65%, mild LVH; IVC not commented upon.  BPs have been normal - no hypotension noted except transiently in OR 9/9.  No NSAIDs.    PMH: Past Medical History:  Diagnosis Date  . Anxiety   . Arthritis   . Dysrhythmia    hx of murmur   . History of kidney stones   . Hypertension    off bp meds " for a while"  . Pneumonia last 10 yrs ago   hx of  . PONV (postoperative nausea and vomiting)    hx of   PSH: Past Surgical History:  Procedure Laterality Date  . BIOPSY  05/02/2020   Procedure: BIOPSY;  Surgeon: Ladene Artist, MD;  Location: [facility];  Service: Endoscopy;;  . COLONOSCOPY WITH PROPOFOL N/A 05/21/2020   Procedure: COLONOSCOPY WITH PROPOFOL;  Surgeon: Ladene Artist, MD;  Location: [facility] ENDOSCOPY;  Service: Endoscopy;  Laterality: N/A;  . CYSTOSCOPY W/ URETERAL STENT PLACEMENT Bilateral 08/03/2016    Procedure: CYSTOSCOPY WITH BILATERAL STENT REPLACEMENT, BILATERAL RETROGRADE PYELOGRAM;  Surgeon: Kathie Rhodes, MD;  Location: [facility];  Service: Urology;  Laterality: Bilateral;  . CYSTOSCOPY/RETROGRADE/URETEROSCOPY/STONE EXTRACTION WITH BASKET Bilateral 09/08/2016   Procedure: BILATERAL CYSTOSCOPY/RETROGRADE/URETEROSCOPY/STONE EXTRACTION / HOLMIUM LASER /STENT REMOVAL;  Surgeon: Kathie Rhodes, MD;  Location: [facility];  Service: Urology;  Laterality: Bilateral;  . CYSTOSCOPY/URETEROSCOPY/HOLMIUM LASER/STENT PLACEMENT Right 09/23/2018   Procedure: CYSTOSCOPY/RETROGRADE/URETEROSCOPY/STENT PLACEMENT RIGHT ;  Surgeon: Kathie Rhodes, MD;  Location: [facility];  Service: Urology;  Laterality: Right;  . HEMOSTASIS CLIP PLACEMENT  05/11/2020   Procedure: HEMOSTASIS CLIP PLACEMENT;  Surgeon: Ladene Artist, MD;  Location: [facility] ENDOSCOPY;  Service: Endoscopy;;  . LAPAROTOMY N/A 05/10/2020   Procedure: EXPLORATORY LAPAROTOMY;  Surgeon: Donnie Mesa, MD;  Location: [facility];  Service: General;  Laterality: N/A;  . LIVER BIOPSY N/A 04/29/2020   Procedure: LIVER BIOPSY;  Surgeon: Donnie Mesa, MD;  Location: [facility];  Service: General;  Laterality: N/A;  . PARTIAL COLECTOMY N/A 05/13/2020   Procedure: PARTIAL COLECTOMY;  Surgeon: Donnie Mesa, MD;  Location: [facility];  Service: General;  Laterality: N/A;  . polyp removed from uterus    . POLYPECTOMY  05/07/2020   Procedure: POLYPECTOMY;  Surgeon: Ladene Artist, MD;  Location: [facility] ENDOSCOPY;  Service: Endoscopy;;  . SUBMUCOSAL TATTOO INJECTION  05/20/2020   Procedure: SUBMUCOSAL TATTOO INJECTION;  Surgeon: Ladene Artist, MD;  Location: [facility] ENDOSCOPY;  Service: Endoscopy;;     Past Medical History:  Diagnosis Date  . Anxiety   . Arthritis   . Dysrhythmia    hx of murmur   . History of kidney stones   . Hypertension    off bp meds " for a while"  . Pneumonia last 10 yrs ago   hx of  . PONV (postoperative nausea and vomiting)    hx of     Medications:  I have reviewed the patient's current medications.  Medications Prior to Admission  Medication Sig Dispense Refill  . PARoxetine (PAXIL) 40 MG tablet TAKE 1 TABLET(40 MG) BY MOUTH EVERY MORNING (Patient taking differently: Take 40 mg by mouth daily. ) 30 tablet 3  . ciprofloxacin (CIPRO) 500 MG tablet Take 500 mg by mouth 2 (two) times daily.      ALLERGIES:   Allergies  Allergen Reactions  . Sulfa Antibiotics Rash    Rash on legs    FAM HX: History reviewed. No pertinent family history.  Social History:   reports that she has never smoked. She has never used smokeless tobacco. She reports current alcohol use of about 14.0 standard drinks of alcohol per week. She reports that she does not use drugs.  ROS: 12 system ROS per HPI above  Blood pressure 139/73, pulse 81, temperature 98.3 F (36.8 C), temperature source Oral, resp. rate 18, height 5\' 4"  (1.626 m), weight 82.2 kg, SpO2 93 %. PHYSICAL EXAM: Gen: chronically ill but nontoxic appearing in bedside chair  Eyes: anicteric ENT: MM tacky Neck: supple CV:  RRR Abd:  Distended, binder in place, midline staples c/d/i Lungs: clear ant and laterally GU: no foley Extr:  2+ pitting to thighs Neuro: conversant, tired Skin: scattered ecchymosis, no rashes   Results for orders placed or performed during the hospital encounter of 05/08/2020 (from the past 48 hour(s))  Basic metabolic panel     Status: Abnormal   Collection Time: 05/14/20 12:16 AM  Result Value Ref Range   Sodium 129 (L) 135 - 145 mmol/L   Potassium 4.2 3.5 - 5.1 mmol/L   Chloride 98 98 - 111 mmol/L   CO2 20 (L) 22 - 32 mmol/L   Glucose, Bld 117 (H) 70 - 99 mg/dL    Comment: Glucose reference range applies only to samples taken after fasting for at least 8 hours.   BUN 55 (H) 8 - 23 mg/dL   Creatinine, Ser 2.33 (H) 0.44 - 1.00 mg/dL   Calcium 9.3 8.9 - 10.3 mg/dL   GFR calc non Af Amer 20 (L) >60 mL/min   GFR calc Af Amer 23 (L) >60  mL/min   Anion gap 11 5 - 15    Comment: Performed at [facility] [address], [place],  [address]  CBC     Status: Abnormal   Collection Time: 05/14/20 12:16 AM  Result Value Ref Range   WBC 15.9 (H) 4.0 - 10.5 K/uL   RBC 4.11 3.87 - 5.11 MIL/uL   Hemoglobin 10.7 (L) 12.0 - 15.0 g/dL   HCT 36.1 36 - 46 %   MCV 87.8 80.0 - 100.0 fL   MCH 26.0 26.0 - 34.0 pg   MCHC 29.6 (L) 30.0 - 36.0 g/dL   RDW 26.6 (H) 11.5 - 15.5 %   Platelets 246 150 - 400 K/uL   nRBC 0.0 0.0 -  0.2 %    Comment: Performed at [facility], [place] [address], [place], [place] [address]  CBC     Status: Abnormal   Collection Time: 05/15/20  1:02 AM  Result Value Ref Range   WBC 13.0 (H) 4.0 - 10.5 K/uL   RBC 3.58 (L) 3.87 - 5.11 MIL/uL   Hemoglobin 9.6 (L) 12.0 - 15.0 g/dL   HCT 31.4 (L) 36 - 46 %   MCV 87.7 80.0 - 100.0 fL   MCH 26.8 26.0 - 34.0 pg   MCHC 30.6 30.0 - 36.0 g/dL   RDW 26.8 (H) 11.5 - 15.5 %   Platelets 297 150 - 400 K/uL   nRBC 0.0 0.0 - 0.2 %    Comment: Performed at [facility], [place] [address], [place], [place] [address]  Comprehensive metabolic panel     Status: Abnormal   Collection Time: 05/15/20  1:02 AM  Result Value Ref Range   Sodium 130 (L) 135 - 145 mmol/L   Potassium 4.2 3.5 - 5.1 mmol/L   Chloride 98 98 - 111 mmol/L   CO2 20 (L) 22 - 32 mmol/L   Glucose, Bld 94 70 - 99 mg/dL    Comment: Glucose reference range applies only to samples taken after fasting for at least 8 hours.   BUN 68 (H) 8 - 23 mg/dL   Creatinine, Ser 2.62 (H) 0.44 - 1.00 mg/dL   Calcium 8.7 (L) 8.9 - 10.3 mg/dL   Total Protein 5.6 (L) 6.5 - 8.1 g/dL   Albumin 2.7 (L) 3.5 - 5.0 g/dL   AST 31 15 - 41 U/L   ALT 26 0 - 44 U/L   Alkaline Phosphatase 106 38 - 126 U/L   Total Bilirubin 1.1 0.3 - 1.2 mg/dL   GFR calc non Af Amer 17 (L) >60 mL/min   GFR calc Af Amer 20 (L) >60 mL/min   Anion gap 12 5 - 15    Comment: Performed at [facility] [address], [place],  [place] [address]    No results found.  Assessment/Plan **Colon ca: s/p resection, plans for outpt onc f/u.   **AKI:  Normal baseline renal function now with uptrending creatinine over past several days.  She had a CT with contrast but time course doesn't fit CIN.  She is edematous but having poor po intake --> perhaps transient hypovolemia complicated by hypoalbuminemia with Alb in 2s.  ? HRS physiology playing a role.  Urine sodium several days ago was < 10 though could have been intravascular volume depletion.  Urine is bland.  Imaging with no obstruction though noted L atrophy.  --Albumin 25g IV x 3 doses daily x 2 days to volume expand --Daily labs --Need accurate I/Os  --Avoid hypotension and nephrotoxins.   **Cirrhosis: no h/o such - on liver biopsy during ex lap.  Primary undertaking dx w/u.   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    Justin Mend 05/15/2020, 1:44 PM

## 2020-05-15 NOTE — Plan of Care (Signed)

## 2020-05-15 NOTE — Progress Notes (Signed)
Physical Therapy Treatment Patient Details Name: Regina Richards MRN: [id number] DOB: 10/04/1945 Today's Date: 05/15/2020    History of Present Illness Pt is 74 yo female with PMH including anxiety, HTN, and kidney stones.  Pt presented with [facility] for 3-4 weeks.  She was found to have a hgb of 2.9 in the ED.  Pt admitted with symptomatic anemia and pancytopenia.  Pt had  colonscopy which showed partially obstructing sigmoid colon mass that was biopsied.  Pt is s/p L hemicolectomy , mobilization of splenic flexure, and liver biopsy on 05/11/2020    PT Comments    Pt admitted with above diagnosis. Pt was able to ambulate with RW with mod to min assist of 2 with chair follow. Pt having difficulty intiating movement to EOB as well as needing cuing for processing information.  Pt with decr cognition overall and intermittent confusion per nurse. Sister present and pt does not have 24 hour care. Given that pt is going home alone and had no assist, will need SNF therefore updated d/c plan.  Also changed frequency to appropriate frequency of 2x week.  0/5 goals met as pt has had slower progress than anticipated therefore revised goals today.   Pt currently with functional limitations due to balance and endurance deficits. Pt will benefit from skilled PT to increase their independence and safety with mobility to allow discharge to the venue listed below.     Follow Up Recommendations  Supervision/Assistance - 24 hour;SNF     Equipment Recommendations  Other (comment) (TBA)    Recommendations for Other Services       Precautions / Restrictions Precautions Precautions: Fall Restrictions Weight Bearing Restrictions: No    Mobility  Bed Mobility Overal bed mobility: Needs Assistance Bed Mobility: Supine to Sit Rolling: Min assist   Supine to sit: HOB elevated;Mod assist     General bed mobility comments: increased time, cues for sequencing, +rail  Transfers Overall transfer level: Needs  assistance Equipment used: Rolling walker (2 wheeled) Transfers: Sit to/from Stand Sit to Stand: Mod assist;+2 physical assistance;From elevated surface         General transfer comment: cues for hand placement, assist to power up, increased time  Ambulation/Gait Ambulation/Gait assistance: Min assist;+2 safety/equipment;Mod assist Gait Distance (Feet): 95 Feet Assistive device: Rolling walker (2 wheeled) Gait Pattern/deviations: Step-through pattern;Decreased stride length;Trunk flexed;Shuffle;Drifts right/left Gait velocity: decreased Gait velocity interpretation: <1.31 ft/sec, indicative of household ambulator General Gait Details: Pt ambulated to hallway but only with max encouragement as she did not want to walk period.  Had to keep persuading pt to keep going. Pt needed constant cues for progression, for upright posture and to sequence steps and RW.  Pt could not give a reason for wanting to stop but kept asking to stop.  Followed with chair and progressed distance but with alot of assist.    Stairs             Wheelchair Mobility    Modified Rankin (Stroke Patients Only)       Balance Overall balance assessment: Needs assistance Sitting-balance support: No upper extremity supported;Feet supported Sitting balance-Leahy Scale: Poor Sitting balance - Comments: Pt with posterior lean needing UE support for balance or min assist to sit EOB.  Postural control: Posterior lean Standing balance support: During functional activity;Bilateral upper extremity supported Standing balance-Leahy Scale: Poor Standing balance comment: Needed UE support on RW and external support for standing and for amb  Cognition Arousal/Alertness: Awake/alert Behavior During Therapy: WFL for tasks assessed/performed Overall Cognitive Status: Impaired/Different from baseline Area of Impairment: Problem solving;Memory                     Memory:  Decreased short-term memory       Problem Solving: Slow processing;Difficulty sequencing;Decreased initiation;Requires verbal cues General Comments: cues for sequencing for mobility      Exercises      General Comments General comments (skin integrity, edema, etc.): O2 at rest 88-92% on RA.  Pt sats not reading well with ambulation and pt DOE 3/4 therefore did walk with 3-4LO2. when sats picked up they were fluctuating from 85-95%.       Pertinent Vitals/Pain Pain Assessment: Faces Faces Pain Scale: Hurts even more Pain Location: abdomen Pain Descriptors / Indicators: Discomfort Pain Intervention(s): Limited activity within patient's tolerance;Monitored during session;Repositioned    Home Living                      Prior Function            PT Goals (current goals can now be found in the care plan section) Acute Rehab PT Goals Patient Stated Goal: return home Progress towards PT goals: Not progressing toward goals - comment (Asked to reassess and pt does need SNF)    Frequency    Min 2X/week      PT Plan Discharge plan needs to be updated;Frequency needs to be updated    Co-evaluation              AM-PAC PT "6 Clicks" Mobility   Outcome Measure  Help needed turning from your back to your side while in a flat bed without using bedrails?: A Lot Help needed moving from lying on your back to sitting on the side of a flat bed without using bedrails?: A Lot Help needed moving to and from a bed to a chair (including a wheelchair)?: A Lot Help needed standing up from a chair using your arms (e.g., wheelchair or bedside chair)?: A Lot Help needed to walk in hospital room?: A Lot Help needed climbing 3-5 steps with a railing? : A Lot 6 Click Score: 12    End of Session Equipment Utilized During Treatment: Gait belt;Oxygen Activity Tolerance: Patient limited by fatigue Patient left: in chair;with call bell/phone within reach;with chair alarm set;with  family/visitor present (sister present) Nurse Communication: Mobility status PT Visit Diagnosis: Other abnormalities of gait and mobility (R26.89)     Time: 1610-9604 PT Time Calculation (min) (ACUTE ONLY): 17 min  Charges:  $Gait Training: 8-22 mins                     Orpheus Hayhurst W,PT [place] Pager:  [phone]  Office:  [place] 05/15/2020, 3:56 PM

## 2020-05-15 NOTE — Progress Notes (Signed)
PROGRESS NOTE    Regina Richards  DGU:[id number] DOB: 29-Aug-1945 DOA: 05/20/2020 PCP: Patient, No Pcp Per   Brief Narrative:  Regina Richards is a 74 y.o. F with HTN who presented with few weeks exertional fatigue, found to have severe anemia. In the ER, Hgb 2.9, platelets 98K, WBC 3.9K.  CXR clear.  CT head unremarkable. She was transfused and GI and hematology were consulted. 9/6 Colonoscopy -- colon mass found 9/9 exploratory laparotomy and resection and liver biopsy   Assessment & Plan:   Principal Problem:   Symptomatic anemia Active Problems:   Essential hypertension, benign   Pancytopenia (HCC)   History of kidney stones   CKD (chronic kidney disease), stage III   Syncope and collapse   Bradycardia   Thrombocytopenia (HCC)   Iron deficiency anemia   Benign neoplasm of cecum   Benign neoplasm of transverse colon   Benign neoplasm of descending colon   Benign neoplasm of rectum   Colonic mass   Invasive colon cancer S/p partial colectomy 9/9  All nodes negative.  pT3, pN0 path staging. -Outpatient Oncology follow up will be needed  Cirrhosis No prior history.  Found incidentally on liver biopsy during ex lap.  Pancytopenia Severe iron deficiency anemia due to chronic blood loss Mild thrombocytopenia Hgb stable  Acute kidney failure Baseline Cr 0.7.  Over last 4 days, Cr has progressively worsened, with IV Lasix yesterday, it is now up to 2.6 today. Renal US negative, UA bland Neph consult today  CHF ruled out Diuresed and renal function worsening. Likely contributing to elev cr  Obesity BMI 30.88  Anxiety -Continue Paxil   Hypoalbuminemia Due to cancer   DVT prophylaxis: Heparin SQ  Code Status: full    Code Status Orders  (From admission, onward)         Start     Ordered   05/18/2020 1738  Full code  Continuous        04/28/2020 1738        Code Status History    Date Active Date Inactive Code Status Order ID Comments User Context    08/04/2016 0035 08/09/2016 2038 Full Code [id number]  Corey Harold, NP Inpatient   Advance Care Planning Activity     Family Communication: spoke with sister Disposition Plan:     Status is: Inpatient  Remains inpatient appropriate because:worsening renal function, requires ongoing IV thearpies   Dispo: The patient is from: Home  Anticipated d/c is to: SNF  Anticipated d/c date is: 3 days  Patient currently is not medically stable to d/c.      The patient was admitted with severe anemia, found to be from a tumor.  This has been surgically removed, and from a surgical standpoint she is cleared for discharge.  However over the last several days, she has had still a worsening renal function, as well as being severely fluid overloaded.  Diuresis was attempted, but her renal function is significantly worse today.  I will attempt albumin infusions but if worsening we will need to involve nephrology.     Consults called: nephrology Admission status: Inpatient   Consultants:   gen surg, neph  Procedures:  CT ABDOMEN PELVIS W WO CONTRAST  Result Date: 05/16/2020 CLINICAL DATA:  Sigmoid colon mass at colonoscopy. EXAM: CT ABDOMEN AND PELVIS WITHOUT AND WITH CONTRAST TECHNIQUE: Multidetector CT imaging of the abdomen and pelvis was performed following the standard protocol before and following the bolus administration of intravenous contrast. CONTRAST:  145mL OMNIPAQUE IOHEXOL 300 MG/ML  SOLN COMPARISON:  05/01/2020 abdominal ultrasound. The most recent CT 08/07/2016. FINDINGS: Lower chest: Dependent right lower lobe atelectasis. Normal heart size with LAD coronary artery calcification. Tiny right and trace left pleural effusions. Hepatobiliary: Subtly irregular hepatic capsule with caudate lobe enlargement. No focal liver lesion. Normal gallbladder, without biliary ductal dilatation. Pancreas: Normal, without mass or ductal dilatation.  Spleen: Normal in size, without focal abnormality. Adrenals/Urinary Tract: Normal adrenal glands. Bilateral renal collecting system calculi on the order of 3 mm and less. Right extrarenal pelvis which contains a dependent 3 mm stone. Normal ureters and urinary bladder Stomach/Bowel: Normal stomach, without wall thickening. The rectum and sigmoid are underdistended, but no dominant sigmoid mass is seen. There is soft tissue thickening in the distal descending colon, including on 61/4, which could represent the site of primary. The descending colon appears thick walled more proximally on 52/4, possibly due to underdistention. Normal terminal ileum and appendix. Normal small bowel. Vascular/Lymphatic: Aortic atherosclerosis. No abdominopelvic adenopathy. Reproductive: Normal uterus and adnexa. Other: Small volume abdominopelvic ascites. No evidence of omental or peritoneal disease. Musculoskeletal: Convex right lumbar spine curvature. Advanced lumbosacral spondylosis. IMPRESSION: 1. Wall thickening focally involving the distal descending colon, most likely the site of primary. No dominant sigmoid mass identified. 2. No findings of metastatic disease within the abdomen or pelvis. 3. Bilateral pleural effusions and ascites, possibly representing fluid overload. 4. Hepatic morphology which suggests cirrhosis. Correlate with risk factors. 5. Bilateral nephrolithiasis. 6. Coronary artery atherosclerosis. Aortic Atherosclerosis (ICD10-I70.0). Electronically Signed   By: Abigail Miyamoto M.D.   On: 05/12/2020 14:52   DG Chest 2 View  Result Date: 05/09/2020 CLINICAL DATA:  Fever today; underwent exploratory laparotomy and partial colectomy on 05/19/2020 EXAM: CHEST - 2 VIEW COMPARISON:  I 321 FINDINGS: Enlargement of cardiac silhouette with slight vascular congestion. Mediastinal contours normal. Bibasilar pleural effusions and atelectasis greater on RIGHT. Elevation of RIGHT diaphragm. Free air under the RIGHT diaphragm and  outlining a bowel loop in the mid abdomen, likely related to exploratory laparotomy 3 days ago. Upper lungs clear. No pneumothorax or acute osseous findings. IMPRESSION: Bibasilar effusions and atelectasis. Elevated RIGHT diaphragm with free intraperitoneal air likely related to recent laparotomy 3 days ago. Electronically Signed   By: Lavonia Dana M.D.   On: 05/09/2020 15:48   CT Head Wo Contrast  Result Date: 05/13/2020 CLINICAL DATA:  Provided history: Syncope, simple, normal neuro exam, seizure-like activity. EXAM: CT HEAD WITHOUT CONTRAST TECHNIQUE: Contiguous axial images were obtained from the base of the skull through the vertex without intravenous contrast. COMPARISON:  Head CT 10/05/2015. FINDINGS: Brain: Mildly motion degraded examination. Mild generalized parenchymal atrophy, progressed as compared to the head CT of 08/03/2016. Redemonstrated chronic cortically based infarct within the left frontal lobe in the left frontal operculum. Stable mild ill-defined hypoattenuation within the cerebral white matter which is nonspecific, but consistent with chronic small vessel ischemic disease. There is no acute intracranial hemorrhage. No acute demarcated cortical infarct. No extra-axial fluid collection. No evidence of intracranial mass. No midline shift. Vascular: No hyperdense vessel. Skull: Normal. Negative for fracture or focal lesion. Sinuses/Orbits: Visualized orbits show no acute finding. Mild mucosal thickening within the inferior right maxillary sinus. No significant mastoid effusion. IMPRESSION: No CT evidence of acute intracranial abnormality. 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 Stable mild cerebral white matter chronic small vessel ischemic disease. Mild generalized parenchymal atrophy, progressed as compared to the prior CT of 08/03/2016. Mild right maxillary  sinus mucosal thickening. Electronically Signed   By: Kellie Simmering DO   On: 05/06/2020 19:08   US Abdomen  Complete  Result Date: 05/01/2020 CLINICAL DATA:  Thrombocytopenia, nephrolithiasis EXAM: ABDOMEN ULTRASOUND COMPLETE COMPARISON:  None. FINDINGS: Gallbladder: The gallbladder is partially decompressed, but is otherwise unremarkable. No intraluminal stones or sludge is identified. Common bile duct: Diameter: 5 mm in proximal diameter Liver: No focal lesion identified. Within normal limits in parenchymal echogenicity. Portal vein is patent on color Doppler imaging with normal direction of blood flow towards the liver. IVC: No abnormality visualized. Pancreas: Visualized portion unremarkable. Spleen: The spleen is mildly enlarged measuring 13.7 cm in greatest dimension. No intrasplenic lesions are seen. Right Kidney: Length: 10.9 cm. Cortical thickness is preserved and cortical echogenicity is normal. 9 mm and 6 mm nonobstructing calculi are seen within the lower pole of the right kidney. No hydronephrosis. No solid intrarenal mass identified. Left Kidney: Length: 9.3 cm. Echogenicity within normal limits. No mass or hydronephrosis visualized. Abdominal aorta: No aneurysm visualized. Other findings: Small bilateral pleural effusions are identified. IMPRESSION: 1. Small bilateral pleural effusions. 2. Mild splenomegaly. 3. Nonobstructing right nephrolithiasis. Electronically Signed   By: Fidela Salisbury MD   On: 05/01/2020 21:30   US RENAL  Result Date: 05/11/2020 CLINICAL DATA:  Acute kidney injury. EXAM: RENAL / URINARY TRACT ULTRASOUND COMPLETE COMPARISON:  CT 05/08/2020 FINDINGS: Right Kidney: Renal measurements: 10.9 x 5.9 x 6.5 cm = volume: 218 mL. Normal echogenicity. No hydronephrosis. Multiple small renal calculi, the largest measured at 9 mm. Left Kidney: Renal measurements: 9.2 x 5.9 x 3.9 cm = volume: 111 mL. Generalized volume loss. Multiple small calculi, the largest in the lower pole measuring 5-6 mm. No hydronephrosis. Bladder: Appears normal for degree of bladder distention. Other: Small amount of  ascites is present. IMPRESSION: 1. No hydronephrosis. Multiple small bilateral renal calculi. 2. Left renal atrophy. 3. Small amount of ascites. Electronically Signed   By: Nelson Chimes M.D.   On: 05/11/2020 10:39   DG CHEST PORT 1 VIEW  Result Date: 05/13/2020 CLINICAL DATA:  Hypoxia EXAM: PORTABLE CHEST 1 VIEW COMPARISON:  None. FINDINGS: Low lung volumes with bibasilar atelectasis. Heart is normal size. No effusions or acute bony abnormality. IMPRESSION: Low lung volumes, bibasilar atelectasis. Electronically Signed   By: Rolm Baptise M.D.   On: 05/13/2020 08:32   DG Chest Portable 1 View  Result Date: 05/15/2020 CLINICAL DATA:  Weakness EXAM: PORTABLE CHEST 1 VIEW COMPARISON:  09/06/2016 chest radiograph and prior. FINDINGS: Hypoinflated lungs. Streaky bibasilar opacities. No pneumothorax. Blunting of the bilateral costophrenic sulci may reflect scarring versus trace effusions. Cardiomediastinal silhouette within normal limits. Multilevel spondylosis. IMPRESSION: Hypoinflated lungs.  Bibasilar linear opacities, likely atelectasis. Trace bilateral pleural effusions versus bibasilar scarring. Electronically Signed   By: Primitivo Gauze M.D.   On: 05/24/2020 14:03   DG Abd Portable 1V  Result Date: 05/11/2020 CLINICAL DATA:  Abdominal distension EXAM: PORTABLE ABDOMEN - 1 VIEW COMPARISON:  Abdominal CT from 8 days ago FINDINGS: Artifact from EKG leads. Gas distended structure in the central abdomen correlates with stomach by prior CT. No gas dilated small or large bowel seen. No concerning mass effect or gas collection. Limited study due to portable technique and soft tissue attenuation. IMPRESSION: Nonobstructive bowel gas pattern. No adverse change since CT 05/14/2020 Electronically Signed   By: Monte Fantasia M.D.   On: 05/11/2020 08:57   ECHOCARDIOGRAM COMPLETE  Result Date: 05/01/2020    ECHOCARDIOGRAM REPORT  Patient Name:   AADVIKA KONEN Date of Exam: 05/01/2020 Medical Rec #:  [id number]      Height:       62.0 in Accession #:    [id number]    Weight:       173.9 lb Date of Birth:  1946-05-04     BSA:          1.802 m Patient Age:    61 years      BP:           145/66 mmHg Patient Gender: F             HR:           72 bpm. Exam Location:  Inpatient Procedure: 2D Echo, Cardiac Doppler and Color Doppler Indications:    Murmur  History:        Patient has no prior history of Echocardiogram examinations.                 Signs/Symptoms:Shortness of Breath; Risk Factors:Hypertension.  Sonographer:    Clayton Lefort RDCS (AE) Referring Phys: [id number] Abigail Butts  Sonographer Comments: Limited patient mobility due to patient's scoliosis. Patient sitting at 45 degrees in bed for echocardiogram. IMPRESSIONS  1. Left ventricular ejection fraction, by estimation, is 60%. The left ventricle has normal function. The left ventricle has no regional wall motion abnormalities. There is mild left ventricular hypertrophy. Left ventricular diastolic parameters were normal.  2. Right ventricular systolic function is normal. The right ventricular size is normal.  3. Left atrial size was mildly dilated.  4. The mitral valve is grossly normal. Mild mitral valve regurgitation. No evidence of mitral stenosis.  5. Tricuspid valve regurgitation is mild to moderate.  6. The aortic valve is grossly normal. Aortic valve regurgitation is mild. No aortic stenosis is present. FINDINGS  Left Ventricle: Left ventricular ejection fraction, by estimation, is 60%. The left ventricle has normal function. The left ventricle has no regional wall motion abnormalities. The left ventricular internal cavity size was normal in size. There is mild left ventricular hypertrophy. Left ventricular diastolic parameters were normal. Right Ventricle: The right ventricular size is normal. No increase in right ventricular wall thickness. Right ventricular systolic function is normal. Left Atrium: Left atrial size was mildly dilated. Right Atrium: Right  atrial size was normal in size. Pericardium: There is no evidence of pericardial effusion. Mitral Valve: The mitral valve is grossly normal. Normal mobility of the mitral valve leaflets. Mild mitral valve regurgitation. No evidence of mitral valve stenosis. MV peak gradient, 7.1 mmHg. The mean mitral valve gradient is 2.0 mmHg. Tricuspid Valve: The tricuspid valve is grossly normal. Tricuspid valve regurgitation is mild to moderate. No evidence of tricuspid stenosis. Aortic Valve: The aortic valve is grossly normal. Aortic valve regurgitation is mild. Aortic regurgitation PHT measures 354 msec. No aortic stenosis is present. Aortic valve mean gradient measures 13.0 mmHg. Aortic valve peak gradient measures 26.4 mmHg.  Aortic valve area, by VTI measures 1.58 cm. Pulmonic Valve: The pulmonic valve was grossly normal. Pulmonic valve regurgitation is trivial. No evidence of pulmonic stenosis. Aorta: The aortic root and ascending aorta are structurally normal, with no evidence of dilitation. Venous: The inferior vena cava was not well visualized. IAS/Shunts: The interatrial septum was not well visualized.  LEFT VENTRICLE PLAX 2D LVIDd:         4.60 cm  Diastology LVIDs:         3.10 cm  LV e' lateral:  8.16 cm/s LV PW:         1.00 cm  LV E/e' lateral: 16.2 LV IVS:        1.00 cm  LV e' medial:    8.05 cm/s LVOT diam:     1.80 cm  LV E/e' medial:  16.4 LV SV:         83 LV SV Index:   46 LVOT Area:     2.54 cm  RIGHT VENTRICLE RV Basal diam:  3.00 cm RV S prime:     13.50 cm/s TAPSE (M-mode): 3.2 cm LEFT ATRIUM             Index       RIGHT ATRIUM           Index LA diam:        4.00 cm 2.22 cm/m  RA Area:     17.20 cm LA Vol (A2C):   65.6 ml 36.41 ml/m RA Volume:   41.80 ml  23.20 ml/m LA Vol (A4C):   57.7 ml 32.03 ml/m LA Biplane Vol: 61.7 ml 34.25 ml/m  AORTIC VALVE AV Area (Vmax):    1.51 cm AV Area (Vmean):   1.53 cm AV Area (VTI):     1.58 cm AV Vmax:           257.00 cm/s AV Vmean:          168.000  cm/s AV VTI:            0.524 m AV Peak Grad:      26.4 mmHg AV Mean Grad:      13.0 mmHg LVOT Vmax:         153.00 cm/s LVOT Vmean:        101.000 cm/s LVOT VTI:          0.325 m LVOT/AV VTI ratio: 0.62 AI PHT:            354 msec  AORTA Ao Root diam: 2.80 cm Ao Asc diam:  2.80 cm MITRAL VALVE                TRICUSPID VALVE MV Area (PHT): 3.60 cm     TR Peak grad:   44.9 mmHg MV Peak grad:  7.1 mmHg     TR Vmax:        335.00 cm/s MV Mean grad:  2.0 mmHg MV Vmax:       1.33 m/s     SHUNTS MV Vmean:      62.9 cm/s    Systemic VTI:  0.32 m MV Decel Time: 211 msec     Systemic Diam: 1.80 cm MV E velocity: 132.00 cm/s MV A velocity: 91.50 cm/s MV E/A ratio:  1.44 Regina Kaiser MD Electronically signed by Regina Kaiser MD Signature Date/Time: 05/01/2020/2:42:52 PM    Final      Subjective: No acute events, reports abd is pain free, but doesn't like the food  Objective: Vitals:   05/15/20 0257 05/15/20 0647 05/15/20 0803 05/15/20 1200  BP: 125/63  137/84 139/73  Pulse: 85  79 81  Resp: 16  17 18   Temp: 97.6 F (36.4 C)  98.2 F (36.8 C) 98.3 F (36.8 C)  TempSrc: Oral  Oral Oral  SpO2: 93%     Weight:  82.2 kg    Height:        Intake/Output Summary (Last 24 hours) at 05/15/2020 1236 Last data filed at 05/14/2020 1706 Gross per 24 hour  Intake  0 ml  Output --  Net 0 ml   Filed Weights   05/13/20 0500 05/14/20 0647 05/15/20 0647  Weight: 81.6 kg 82.7 kg 82.2 kg    Examination: General appearance:  adult female, alert and in no acute distress 'doesn't like the food".   HEENT: Anicteric, conjunctiva pink, lids and lashes normal. No nasal deformity, discharge, epistaxis.  Lips moist.   Skin: Warm and dry.  No jaundice.  No suspicious rashes or lesions. Cardiac: RRR, nl S1-S2, no murmurs appreciated.  Capillary refill is brisk.  JVP visible.  2+ pitting bilateral LE edema.  Radial  pulses 2+ and symmetric. Respiratory: Normal respiratory rate and rhythm.  CTAB without rales or wheezes.   Crackles at both bases. Abdomen: Abdomen soft.  No TTP or guarding.  Possible ascites, difficult to assess due to body habitus.   MSK: No deformities or effusions. Neuro: Awake and alert.  EOMI, moves all extremities. Speech fluent.    Psych: Sensorium intact and responding to questions, attention normal. Affect normal.  Judgment and insight appear normal.    Data Reviewed: I have personally reviewed following labs and imaging studies  CBC: Recent Labs  Lab 05/10/20 1344 05/12/20 1819 05/13/20 0204 05/14/20 0016 05/15/20 0102  WBC 10.7* 14.1* 14.6* 15.9* 13.0*  NEUTROABS 9.4*  --   --   --   --   HGB 10.6* 10.7* 11.1* 10.7* 9.6*  HCT 36.4 35.7* 37.7 36.1 31.4*  MCV 92.9 88.6 89.3 87.8 87.7  PLT 112* 190 219 246 528   Basic Metabolic Panel: Recent Labs  Lab 05/09/20 0427 05/09/20 1519 05/10/20 1344 05/12/20 1819 05/13/20 0204 05/14/20 0016 05/15/20 0102  NA   < >  --  137 128* 133* 129* 130*  K   < >  --  4.3 3.6 3.7 4.2 4.2  CL   < >  --  102 97* 103 98 98  CO2   < >  --  22 18* 20* 20* 20*  GLUCOSE   < >  --  178* 123* 109* 117* 94  BUN   < >  --  29* 43* 42* 55* 68*  CREATININE   < >  --  1.99* 1.57* 1.54* 2.33* 2.62*  CALCIUM   < >  --  8.7* 8.7* 9.3 9.3 8.7*  MG  --  1.4* 1.9  --   --   --   --   PHOS  --   --  4.3  --   --   --   --    < > = values in this interval not displayed.   GFR: Estimated Creatinine Clearance: 19.8 mL/min (A) (by C-G formula based on SCr of 2.62 mg/dL (H)). Liver Function Tests: Recent Labs  Lab 05/13/20 0749 05/15/20 0102  AST 57* 31  ALT 31 26  ALKPHOS 184* 106  BILITOT 0.8 1.1  PROT 5.4* 5.6*  ALBUMIN 1.9* 2.7*   No results for input(s): LIPASE, AMYLASE in the last 168 hours. Recent Labs  Lab 05/11/20 1116  AMMONIA 39*   Coagulation Profile: No results for input(s): INR, PROTIME in the last 168 hours. Cardiac Enzymes: No results for input(s): CKTOTAL, CKMB, CKMBINDEX, TROPONINI in the last 168 hours. BNP (last 3  results) No results for input(s): PROBNP in the last 8760 hours. HbA1C: No results for input(s): HGBA1C in the last 72 hours. CBG: No results for input(s): GLUCAP in the last 168 hours. Lipid Profile: No results for input(s): CHOL, HDL,  LDLCALC, TRIG, CHOLHDL, LDLDIRECT in the last 72 hours. Thyroid Function Tests: No results for input(s): TSH, T4TOTAL, FREET4, T3FREE, THYROIDAB in the last 72 hours. Anemia Panel: No results for input(s): VITAMINB12, FOLATE, FERRITIN, TIBC, IRON, RETICCTPCT in the last 72 hours. Sepsis Labs: No results for input(s): PROCALCITON, LATICACIDVEN in the last 168 hours.  Recent Results (from the past 240 hour(s))  Culture, blood (Routine X 2) w Reflex to ID Panel     Status: None   Collection Time: 05/09/20  3:18 PM   Specimen: BLOOD RIGHT HAND  Result Value Ref Range Status   Specimen Description BLOOD RIGHT HAND  Final   Special Requests   Final    BOTTLES DRAWN AEROBIC ONLY Blood Culture results may not be optimal due to an inadequate volume of blood received in culture bottles   Culture   Final    NO GROWTH 5 DAYS Performed at [facility], [place] [address], [place], [place] [address]    Report Status 05/14/2020 FINAL  Final  Culture, blood (Routine X 2) w Reflex to ID Panel     Status: None   Collection Time: 05/09/20  3:24 PM   Specimen: BLOOD RIGHT HAND  Result Value Ref Range Status   Specimen Description BLOOD RIGHT HAND  Final   Special Requests   Final    BOTTLES DRAWN AEROBIC ONLY Blood Culture results may not be optimal due to an inadequate volume of blood received in culture bottles   Culture   Final    NO GROWTH 5 DAYS Performed at [facility], [place] [address], [place], [place] [address]    Report Status 05/14/2020 FINAL  Final         Radiology Studies: No results found.      Scheduled Meds: . (feeding supplement) PROSource Plus  30 mL Oral BID BM  . Chlorhexidine Gluconate Cloth  6 each Topical Daily  .  feeding supplement (ENSURE ENLIVE)  237 mL Oral BID BM  . Gerhardt's butt cream   Topical BID  . heparin injection (subcutaneous)  5,000 Units Subcutaneous Q8H  . PARoxetine  40 mg Oral Daily  . polyethylene glycol  17 g Oral Daily   Continuous Infusions: . sodium chloride Stopped (05/13/20 1300)     LOS: 15 days    Time spent: 23 min    Nicolette Bang, MD [facility]  If 7PM-7AM, please contact night-coverage  05/15/2020, 12:36 PM

## 2020-05-16 LAB — BASIC METABOLIC PANEL
Anion gap: 13 (ref 5–15)
BUN: 74 mg/dL — ABNORMAL HIGH (ref 8–23)
CO2: 20 mmol/L — ABNORMAL LOW (ref 22–32)
Calcium: 8.6 mg/dL — ABNORMAL LOW (ref 8.9–10.3)
Chloride: 99 mmol/L (ref 98–111)
Creatinine, Ser: 2.32 mg/dL — ABNORMAL HIGH (ref 0.44–1.00)
GFR calc Af Amer: 23 mL/min — ABNORMAL LOW (ref >60)
GFR calc non Af Amer: 20 mL/min — ABNORMAL LOW (ref >60)
Glucose, Bld: 78 mg/dL (ref 70–99)
Potassium: 3.5 mmol/L (ref 3.5–5.1)
Sodium: 132 mmol/L — ABNORMAL LOW (ref 135–145)

## 2020-05-16 LAB — CBC
HCT: 30 % — ABNORMAL LOW (ref 36.0–46.0)
Hemoglobin: 9 g/dL — ABNORMAL LOW (ref 12.0–15.0)
MCH: 26.8 pg (ref 26.0–34.0)
MCHC: 30 g/dL (ref 30.0–36.0)
MCV: 89.3 fL (ref 80.0–100.0)
Platelets: 286 10*3/uL (ref 150–400)
RBC: 3.36 MIL/uL — ABNORMAL LOW (ref 3.87–5.11)
RDW: 26.9 % — ABNORMAL HIGH (ref 11.5–15.5)
WBC: 13.5 10*3/uL — ABNORMAL HIGH (ref 4.0–10.5)
nRBC: 0 % (ref 0.0–0.2)

## 2020-05-16 NOTE — Plan of Care (Signed)

## 2020-05-16 NOTE — Progress Notes (Signed)
[facility] Progress Note   Subjective/Interval:   No I/Os documented.  Says she went in [facility] several times without issue yesterday.  She says she feels 'good.' appetite is improved.  No dysgeusia, nausea.    Background from initial c/s:   Regina Richards is an 74 y.o. female with anxiety, HTN currently admitted for anemia who is seen for evaluation and management of AKI.    Pt presented 9/3 with several weeks of DOE, found to have Hb 2.9, MCV 75 (pancytopenia).   Transfused.  Colonoscopy with mass on 9/6, 9/9 ex lap, partial colectomy and liver biopsy.  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     Cr initially normal but starting 9/10 trending up 1.25 > 1.3 > 2 > 1.57 > 9/16 1.54 > 9/17 2.3 > 9/18 2.62.   UOP - really none documented.  Wt on presentation 78.9, today 82.2kg.  Says she's voiding without difficulty in the bed side commode. Has chronic LE edema,not worse here by her report.  Appetite is not robust and PO intake has been variable over the post surgical period.  9/6 CT contrast UA 9/14 bland, Urine sodium < 10.  Renal US 9/14 - R 10.9cm, L 9.2cm, normal echogen of R, L with generalized volume loss and several small calculi without hydro.  TTE with EF 60-65%, mild LVH; IVC not commented upon.  BPs have been normal - no hypotension noted except transiently in OR 9/9.  No NSAIDs.   Objective Vitals:   05/15/20 0803 05/15/20 1200 05/15/20 2018 05/16/20 0353  BP: 137/84 139/73    Pulse: 79 81 87   Resp: 17 18 16    Temp: 98.2 F (36.8 C) 98.3 F (36.8 C) 98.8 F (37.1 C)   TempSrc: Oral Oral Axillary   SpO2:      Weight:    82.7 kg  Height:       Physical Exam Gen: chronically ill but nontoxic appearing in bedside chair  Eyes: anicteric ENT: MM tacky Neck: supple CV:  RRR Abd:  Distended, binder in place, midline staples c/d/i Lungs: clear ant and laterally GU: no foley Extr:  2+ pitting to thighs Neuro: conversant, tired Skin:  scattered ecchymosis, no rashes  Additional Objective Labs: Basic Metabolic Panel: Recent Labs  Lab 05/10/20 1344 05/12/20 1819 05/14/20 0016 05/15/20 0102 05/16/20 0315  NA 137   < > 129* 130* 132*  K 4.3   < > 4.2 4.2 3.5  CL 102   < > 98 98 99  CO2 22   < > 20* 20* 20*  GLUCOSE 178*   < > 117* 94 78  BUN 29*   < > 55* 68* 74*  CREATININE 1.99*   < > 2.33* 2.62* 2.32*  CALCIUM 8.7*   < > 9.3 8.7* 8.6*  PHOS 4.3  --   --   --   --    < > = values in this interval not displayed.   Liver Function Tests: Recent Labs  Lab 05/13/20 0749 05/15/20 0102  AST 57* 31  ALT 31 26  ALKPHOS 184* 106  BILITOT 0.8 1.1  PROT 5.4* 5.6*  ALBUMIN 1.9* 2.7*   No results for input(s): LIPASE, AMYLASE in the last 168 hours. CBC: Recent Labs  Lab 05/10/20 1344 05/10/20 1344 05/12/20 1819 05/12/20 1819 05/13/20 0204 05/13/20 0204 05/14/20 0016 05/15/20 0102 05/16/20 0315  WBC 10.7*   < > 14.1*   < > 14.6*   < >  15.9* 13.0* 13.5*  NEUTROABS 9.4*  --   --   --   --   --   --   --   --   HGB 10.6*   < > 10.7*   < > 11.1*   < > 10.7* 9.6* 9.0*  HCT 36.4   < > 35.7*   < > 37.7   < > 36.1 31.4* 30.0*  MCV 92.9   < > 88.6  --  89.3  --  87.8 87.7 89.3  PLT 112*   < > 190   < > 219   < > 246 297 286   < > = values in this interval not displayed.   Blood Culture    Component Value Date/Time   SDES BLOOD RIGHT HAND 05/09/2020 1524   SPECREQUEST  05/09/2020 1524    BOTTLES DRAWN AEROBIC ONLY Blood Culture results may not be optimal due to an inadequate volume of blood received in culture bottles   CULT  05/09/2020 1524    NO GROWTH 5 DAYS Performed at [facility], [place] [address] [place], [place] [address]    REPTSTATUS 05/14/2020 FINAL 05/09/2020 1524    Cardiac Enzymes: No results for input(s): CKTOTAL, CKMB, CKMBINDEX, TROPONINI in the last 168 hours. CBG: No results for input(s): GLUCAP in the last 168 hours. Iron Studies: No results for input(s): IRON, TIBC,  TRANSFERRIN, FERRITIN in the last 72 hours. @lablastinr3 @ Studies/Results: No results found. Medications: . sodium chloride Stopped (05/13/20 1300)  . albumin human 25 g (05/16/20 0150)   . (feeding supplement) PROSource Plus  30 mL Oral BID BM  . Chlorhexidine Gluconate Cloth  6 each Topical Daily  . feeding supplement (ENSURE ENLIVE)  237 mL Oral BID BM  . Gerhardt's butt cream   Topical BID  . heparin injection (subcutaneous)  5,000 Units Subcutaneous Q8H  . PARoxetine  40 mg Oral Daily  . 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   **AKI:  Normal baseline renal function.   She had a CT with contrast but time course doesn't fit CIN.  She is edematous but having poor po intake --> perhaps transient hypovolemia complicated by hypoalbuminemia with Alb in 2s.  ? HRS physiology playing a role though BPs are not low.  Urine sodium several days ago was < 10, could have been intravascular volume depletion vs HRS.  Urine is bland.  Imaging with no obstruction though noted L atrophy.  --Albumin 25g IV x 3 doses daily x 2 days to volume expand (day 2 starts this PM) --> Cr improved from 2.6 to 2.3, though BUN trending up still.   --Need accurate I/Os  --No current indications for RRT --Avoid hypotension and nephrotoxins.   **Cirrhosis: no h/o such - on liver biopsy during ex lap.  Primary undertaking dx w/u.   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   Jannifer Hick MD 05/16/2020, 7:17 AM  [facility] Pager: [phone]

## 2020-05-16 NOTE — Progress Notes (Signed)
PROGRESS NOTE    Regina Richards  WFU:[id number] DOB: 10/04/1945 DOA: 05/18/2020 PCP: Patient, No Pcp Per   Brief Narrative:  Regina Richards a 74 y.o.Fwith HTN who presented with few weeks exertional fatigue, found to have severe anemia. In the ER, Hgb 2.9, platelets 98K, WBC 3.9K. CXR clear. CT head unremarkable. She was transfused and GI and hematology were consulted. 9/6 Colonoscopy -- colon mass found 9/9 exploratory laparotomy and resection and liver biopsy   Assessment & Plan:   Principal Problem:   Symptomatic anemia Active Problems:   Essential hypertension, benign   Pancytopenia (HCC)   History of kidney stones   CKD (chronic kidney disease), stage III   Syncope and collapse   Bradycardia   Thrombocytopenia (HCC)   Iron deficiency anemia   Benign neoplasm of cecum   Benign neoplasm of transverse colon   Benign neoplasm of descending colon   Benign neoplasm of rectum   Colonic mass   Invasive colon cancer S/p partial colectomy 9/9  All nodes negative. pT3, pN0 path staging. -Outpatient Oncology follow up will be needed  Cirrhosis No prior history. Found incidentally on liver biopsy during ex lap.  Pancytopenia Severe iron deficiency anemia due to chronic blood loss Mild thrombocytopenia Hgb stable  Acute kidney failure Baseline Cr 0.7. Over last 4 days, Cr has progressively worsened,  Declined slightly to 2.3 today. Renal US negative, UA bland Neph consult 9/18 recs 2 days of albumin tid, to complete on Monday-did note BUN is still increasing  CHF ruled out Diuresed and renal function worsening. Likely contributing to elev cr  Obesity BMI 30.88  Anxiety -ContinuePaxil  Hypoalbuminemia Due to cancer  DVT prophylaxis: Heparin SQ  Code Status: Full    Code Status Orders  (From admission, onward)         Start     Ordered   05/01/2020 1738  Full code  Continuous        05/19/2020 1738        Code Status History    Date  Active Date Inactive Code Status Order ID Comments User Context   08/04/2016 0035 08/09/2016 2038 Full Code [id number]  Corey Harold, NP Inpatient   Advance Care Planning Activity     Family Communication: discussed with sister on Saturday, f/uyp  onday pending further renal eval Disposition Plan:    Status is: Inpatient  Remains inpatient appropriate because:worsening renal function, requires ongoing IV thearpies   Dispo: The patient is from:Home Anticipated d/c is to:SNF Anticipated d/c date is: 3 days Patient currently is not medically stable to d/c.      The patient was admitted with severe anemia, found to be from a tumor. This has been surgically removed, and from a surgical standpoint she is cleared for discharge.  However over the last several days, she has had still a worsening renal function, as well as being severely fluid overloaded.  Diuresis was attempted, but her renal function is significantly worse today. I will attempt albumin infusions but if worsening we will need to involve nephrology.  Consults called: None Admission status: Inpatient   Consultants:   gen surg, neph  Procedures:  CT ABDOMEN PELVIS W WO CONTRAST  Result Date: 05/03/2020 CLINICAL DATA:  Sigmoid colon mass at colonoscopy. EXAM: CT ABDOMEN AND PELVIS WITHOUT AND WITH CONTRAST TECHNIQUE: Multidetector CT imaging of the abdomen and pelvis was performed following the standard protocol before and following the bolus administration of intravenous contrast. CONTRAST:  160mL OMNIPAQUE IOHEXOL  300 MG/ML  SOLN COMPARISON:  05/01/2020 abdominal ultrasound. The most recent CT 08/07/2016. FINDINGS: Lower chest: Dependent right lower lobe atelectasis. Normal heart size with LAD coronary artery calcification. Tiny right and trace left pleural effusions. Hepatobiliary: Subtly irregular hepatic capsule with caudate lobe enlargement. No focal liver lesion.  Normal gallbladder, without biliary ductal dilatation. Pancreas: Normal, without mass or ductal dilatation. Spleen: Normal in size, without focal abnormality. Adrenals/Urinary Tract: Normal adrenal glands. Bilateral renal collecting system calculi on the order of 3 mm and less. Right extrarenal pelvis which contains a dependent 3 mm stone. Normal ureters and urinary bladder Stomach/Bowel: Normal stomach, without wall thickening. The rectum and sigmoid are underdistended, but no dominant sigmoid mass is seen. There is soft tissue thickening in the distal descending colon, including on 61/4, which could represent the site of primary. The descending colon appears thick walled more proximally on 52/4, possibly due to underdistention. Normal terminal ileum and appendix. Normal small bowel. Vascular/Lymphatic: Aortic atherosclerosis. No abdominopelvic adenopathy. Reproductive: Normal uterus and adnexa. Other: Small volume abdominopelvic ascites. No evidence of omental or peritoneal disease. Musculoskeletal: Convex right lumbar spine curvature. Advanced lumbosacral spondylosis. IMPRESSION: 1. Wall thickening focally involving the distal descending colon, most likely the site of primary. No dominant sigmoid mass identified. 2. No findings of metastatic disease within the abdomen or pelvis. 3. Bilateral pleural effusions and ascites, possibly representing fluid overload. 4. Hepatic morphology which suggests cirrhosis. Correlate with risk factors. 5. Bilateral nephrolithiasis. 6. Coronary artery atherosclerosis. Aortic Atherosclerosis (ICD10-I70.0). Electronically Signed   By: Abigail Miyamoto M.D.   On: 05/21/2020 14:52   DG Chest 2 View  Result Date: 05/09/2020 CLINICAL DATA:  Fever today; underwent exploratory laparotomy and partial colectomy on 05/13/2020 EXAM: CHEST - 2 VIEW COMPARISON:  I 321 FINDINGS: Enlargement of cardiac silhouette with slight vascular congestion. Mediastinal contours normal. Bibasilar pleural  effusions and atelectasis greater on RIGHT. Elevation of RIGHT diaphragm. Free air under the RIGHT diaphragm and outlining a bowel loop in the mid abdomen, likely related to exploratory laparotomy 3 days ago. Upper lungs clear. No pneumothorax or acute osseous findings. IMPRESSION: Bibasilar effusions and atelectasis. Elevated RIGHT diaphragm with free intraperitoneal air likely related to recent laparotomy 3 days ago. Electronically Signed   By: Lavonia Dana M.D.   On: 05/09/2020 15:48   CT Head Wo Contrast  Result Date: 05/21/2020 CLINICAL DATA:  Provided history: Syncope, simple, normal neuro exam, seizure-like activity. EXAM: CT HEAD WITHOUT CONTRAST TECHNIQUE: Contiguous axial images were obtained from the base of the skull through the vertex without intravenous contrast. COMPARISON:  Head CT 10/05/2015. FINDINGS: Brain: Mildly motion degraded examination. Mild generalized parenchymal atrophy, progressed as compared to the head CT of 08/03/2016. Redemonstrated chronic cortically based infarct within the left frontal lobe in the left frontal operculum. Stable mild ill-defined hypoattenuation within the cerebral white matter which is nonspecific, but consistent with chronic small vessel ischemic disease. There is no acute intracranial hemorrhage. No acute demarcated cortical infarct. No extra-axial fluid collection. No evidence of intracranial mass. No midline shift. Vascular: No hyperdense vessel. Skull: Normal. Negative for fracture or focal lesion. Sinuses/Orbits: Visualized orbits show no acute finding. Mild mucosal thickening within the inferior right maxillary sinus. No significant mastoid effusion. IMPRESSION: No CT evidence of acute intracranial abnormality. 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 Stable mild cerebral white matter chronic small vessel ischemic disease. Mild generalized parenchymal atrophy, progressed as compared to the prior CT of 08/03/2016. Mild right  maxillary sinus mucosal  thickening. Electronically Signed   By: Kellie Simmering DO   On: 05/16/2020 19:08   US Abdomen Complete  Result Date: 05/01/2020 CLINICAL DATA:  Thrombocytopenia, nephrolithiasis EXAM: ABDOMEN ULTRASOUND COMPLETE COMPARISON:  None. FINDINGS: Gallbladder: The gallbladder is partially decompressed, but is otherwise unremarkable. No intraluminal stones or sludge is identified. Common bile duct: Diameter: 5 mm in proximal diameter Liver: No focal lesion identified. Within normal limits in parenchymal echogenicity. Portal vein is patent on color Doppler imaging with normal direction of blood flow towards the liver. IVC: No abnormality visualized. Pancreas: Visualized portion unremarkable. Spleen: The spleen is mildly enlarged measuring 13.7 cm in greatest dimension. No intrasplenic lesions are seen. Right Kidney: Length: 10.9 cm. Cortical thickness is preserved and cortical echogenicity is normal. 9 mm and 6 mm nonobstructing calculi are seen within the lower pole of the right kidney. No hydronephrosis. No solid intrarenal mass identified. Left Kidney: Length: 9.3 cm. Echogenicity within normal limits. No mass or hydronephrosis visualized. Abdominal aorta: No aneurysm visualized. Other findings: Small bilateral pleural effusions are identified. IMPRESSION: 1. Small bilateral pleural effusions. 2. Mild splenomegaly. 3. Nonobstructing right nephrolithiasis. Electronically Signed   By: Fidela Salisbury MD   On: 05/01/2020 21:30   US RENAL  Result Date: 05/11/2020 CLINICAL DATA:  Acute kidney injury. EXAM: RENAL / URINARY TRACT ULTRASOUND COMPLETE COMPARISON:  CT 05/14/2020 FINDINGS: Right Kidney: Renal measurements: 10.9 x 5.9 x 6.5 cm = volume: 218 mL. Normal echogenicity. No hydronephrosis. Multiple small renal calculi, the largest measured at 9 mm. Left Kidney: Renal measurements: 9.2 x 5.9 x 3.9 cm = volume: 111 mL. Generalized volume loss. Multiple small calculi, the largest in the lower pole  measuring 5-6 mm. No hydronephrosis. Bladder: Appears normal for degree of bladder distention. Other: Small amount of ascites is present. IMPRESSION: 1. No hydronephrosis. Multiple small bilateral renal calculi. 2. Left renal atrophy. 3. Small amount of ascites. Electronically Signed   By: Nelson Chimes M.D.   On: 05/11/2020 10:39   DG CHEST PORT 1 VIEW  Result Date: 05/13/2020 CLINICAL DATA:  Hypoxia EXAM: PORTABLE CHEST 1 VIEW COMPARISON:  None. FINDINGS: Low lung volumes with bibasilar atelectasis. Heart is normal size. No effusions or acute bony abnormality. IMPRESSION: Low lung volumes, bibasilar atelectasis. Electronically Signed   By: Rolm Baptise M.D.   On: 05/13/2020 08:32   DG Chest Portable 1 View  Result Date: 05/07/2020 CLINICAL DATA:  Weakness EXAM: PORTABLE CHEST 1 VIEW COMPARISON:  09/06/2016 chest radiograph and prior. FINDINGS: Hypoinflated lungs. Streaky bibasilar opacities. No pneumothorax. Blunting of the bilateral costophrenic sulci may reflect scarring versus trace effusions. Cardiomediastinal silhouette within normal limits. Multilevel spondylosis. IMPRESSION: Hypoinflated lungs.  Bibasilar linear opacities, likely atelectasis. Trace bilateral pleural effusions versus bibasilar scarring. Electronically Signed   By: Primitivo Gauze M.D.   On: 05/26/2020 14:03   DG Abd Portable 1V  Result Date: 05/11/2020 CLINICAL DATA:  Abdominal distension EXAM: PORTABLE ABDOMEN - 1 VIEW COMPARISON:  Abdominal CT from 8 days ago FINDINGS: Artifact from EKG leads. Gas distended structure in the central abdomen correlates with stomach by prior CT. No gas dilated small or large bowel seen. No concerning mass effect or gas collection. Limited study due to portable technique and soft tissue attenuation. IMPRESSION: Nonobstructive bowel gas pattern. No adverse change since CT 05/10/2020 Electronically Signed   By: Monte Fantasia M.D.   On: 05/11/2020 08:57   ECHOCARDIOGRAM COMPLETE  Result Date:  05/01/2020    ECHOCARDIOGRAM REPORT   Patient Name:  Sharlyn Bologna Date of Exam: 05/01/2020 Medical Rec #:  [id number]     Height:       62.0 in Accession #:    [id number]    Weight:       173.9 lb Date of Birth:  11/12/1945     BSA:          1.802 m Patient Age:    39 years      BP:           145/66 mmHg Patient Gender: F             HR:           72 bpm. Exam Location:  Inpatient Procedure: 2D Echo, Cardiac Doppler and Color Doppler Indications:    Murmur  History:        Patient has no prior history of Echocardiogram examinations.                 Signs/Symptoms:Shortness of Breath; Risk Factors:Hypertension.  Sonographer:    Clayton Lefort RDCS (AE) Referring Phys: [id number] Abigail Butts  Sonographer Comments: Limited patient mobility due to patient's scoliosis. Patient sitting at 45 degrees in bed for echocardiogram. IMPRESSIONS  1. Left ventricular ejection fraction, by estimation, is 60%. The left ventricle has normal function. The left ventricle has no regional wall motion abnormalities. There is mild left ventricular hypertrophy. Left ventricular diastolic parameters were normal.  2. Right ventricular systolic function is normal. The right ventricular size is normal.  3. Left atrial size was mildly dilated.  4. The mitral valve is grossly normal. Mild mitral valve regurgitation. No evidence of mitral stenosis.  5. Tricuspid valve regurgitation is mild to moderate.  6. The aortic valve is grossly normal. Aortic valve regurgitation is mild. No aortic stenosis is present. FINDINGS  Left Ventricle: Left ventricular ejection fraction, by estimation, is 60%. The left ventricle has normal function. The left ventricle has no regional wall motion abnormalities. The left ventricular internal cavity size was normal in size. There is mild left ventricular hypertrophy. Left ventricular diastolic parameters were normal. Right Ventricle: The right ventricular size is normal. No increase in right ventricular wall thickness.  Right ventricular systolic function is normal. Left Atrium: Left atrial size was mildly dilated. Right Atrium: Right atrial size was normal in size. Pericardium: There is no evidence of pericardial effusion. Mitral Valve: The mitral valve is grossly normal. Normal mobility of the mitral valve leaflets. Mild mitral valve regurgitation. No evidence of mitral valve stenosis. MV peak gradient, 7.1 mmHg. The mean mitral valve gradient is 2.0 mmHg. Tricuspid Valve: The tricuspid valve is grossly normal. Tricuspid valve regurgitation is mild to moderate. No evidence of tricuspid stenosis. Aortic Valve: The aortic valve is grossly normal. Aortic valve regurgitation is mild. Aortic regurgitation PHT measures 354 msec. No aortic stenosis is present. Aortic valve mean gradient measures 13.0 mmHg. Aortic valve peak gradient measures 26.4 mmHg.  Aortic valve area, by VTI measures 1.58 cm. Pulmonic Valve: The pulmonic valve was grossly normal. Pulmonic valve regurgitation is trivial. No evidence of pulmonic stenosis. Aorta: The aortic root and ascending aorta are structurally normal, with no evidence of dilitation. Venous: The inferior vena cava was not well visualized. IAS/Shunts: The interatrial septum was not well visualized.  LEFT VENTRICLE PLAX 2D LVIDd:         4.60 cm  Diastology LVIDs:         3.10 cm  LV e' lateral:   8.16 cm/s LV PW:  1.00 cm  LV E/e' lateral: 16.2 LV IVS:        1.00 cm  LV e' medial:    8.05 cm/s LVOT diam:     1.80 cm  LV E/e' medial:  16.4 LV SV:         83 LV SV Index:   46 LVOT Area:     2.54 cm  RIGHT VENTRICLE RV Basal diam:  3.00 cm RV S prime:     13.50 cm/s TAPSE (M-mode): 3.2 cm LEFT ATRIUM             Index       RIGHT ATRIUM           Index LA diam:        4.00 cm 2.22 cm/m  RA Area:     17.20 cm LA Vol (A2C):   65.6 ml 36.41 ml/m RA Volume:   41.80 ml  23.20 ml/m LA Vol (A4C):   57.7 ml 32.03 ml/m LA Biplane Vol: 61.7 ml 34.25 ml/m  AORTIC VALVE AV Area (Vmax):    1.51 cm  AV Area (Vmean):   1.53 cm AV Area (VTI):     1.58 cm AV Vmax:           257.00 cm/s AV Vmean:          168.000 cm/s AV VTI:            0.524 m AV Peak Grad:      26.4 mmHg AV Mean Grad:      13.0 mmHg LVOT Vmax:         153.00 cm/s LVOT Vmean:        101.000 cm/s LVOT VTI:          0.325 m LVOT/AV VTI ratio: 0.62 AI PHT:            354 msec  AORTA Ao Root diam: 2.80 cm Ao Asc diam:  2.80 cm MITRAL VALVE                TRICUSPID VALVE MV Area (PHT): 3.60 cm     TR Peak grad:   44.9 mmHg MV Peak grad:  7.1 mmHg     TR Vmax:        335.00 cm/s MV Mean grad:  2.0 mmHg MV Vmax:       1.33 m/s     SHUNTS MV Vmean:      62.9 cm/s    Systemic VTI:  0.32 m MV Decel Time: 211 msec     Systemic Diam: 1.80 cm MV E velocity: 132.00 cm/s MV A velocity: 91.50 cm/s MV E/A ratio:  1.44 Cherlynn Kaiser MD Electronically signed by Cherlynn Kaiser MD Signature Date/Time: 05/01/2020/2:42:52 PM    Final      Subjective: Reports she feels moderatly improved, no abd pain  Objective: Vitals:   05/15/20 1200 05/15/20 2018 05/16/20 0353 05/16/20 0723  BP: 139/73   131/61  Pulse: 81 87    Resp: 18 16    Temp: 98.3 F (36.8 C) 98.8 F (37.1 C)  97.9 F (36.6 C)  TempSrc: Oral Axillary  Oral  SpO2:      Weight:   82.7 kg   Height:       No intake or output data in the 24 hours ending 05/16/20 1243 Filed Weights   05/14/20 0647 05/15/20 0647 05/16/20 0353  Weight: 82.7 kg 82.2 kg 82.7 kg    Examination:  General appearance:adult female,  alert and inno acutedistress 'doesn't like the food".  HEENT:Anicteric, conjunctiva pink, lids and lashes normal. No nasal deformity, discharge, epistaxis. Lips moist.  Skin:Warm and dry. Nojaundice. No suspicious rashes or lesions. Cardiac:RRR, nl S1-S2, no murmurs appreciated. Capillary refill is brisk. JVP visible.2+ pitting bilateralLE edema. Radial pulses 2+ and symmetric. Respiratory:Normal respiratory rate and rhythm. CTAB without rales or  wheezes.Crackles at both bases. Abdomen:Abdomen soft. INCISION c/d/i, STAPLES IN PLACES , no evid of infection MSK:No deformities or effusions. Neuro:Awake and alert. EOMI, moves all extremities. Speech fluent.  Psych:Sensorium intact and responding to questions, attention normal. Affect normal. Judgment and insight appear normal.     Data Reviewed: I have personally reviewed following labs and imaging studies  CBC: Recent Labs  Lab 05/10/20 1344 05/10/20 1344 05/12/20 1819 05/13/20 0204 05/14/20 0016 05/15/20 0102 05/16/20 0315  WBC 10.7*   < > 14.1* 14.6* 15.9* 13.0* 13.5*  NEUTROABS 9.4*  --   --   --   --   --   --   HGB 10.6*   < > 10.7* 11.1* 10.7* 9.6* 9.0*  HCT 36.4   < > 35.7* 37.7 36.1 31.4* 30.0*  MCV 92.9   < > 88.6 89.3 87.8 87.7 89.3  PLT 112*   < > 190 219 246 297 286   < > = values in this interval not displayed.   Basic Metabolic Panel: Recent Labs  Lab 05/09/20 1519 05/10/20 1344 05/10/20 1344 05/12/20 1819 05/13/20 0204 05/14/20 0016 05/15/20 0102 05/16/20 0315  NA  --  137   < > 128* 133* 129* 130* 132*  K  --  4.3   < > 3.6 3.7 4.2 4.2 3.5  CL  --  102   < > 97* 103 98 98 99  CO2  --  22   < > 18* 20* 20* 20* 20*  GLUCOSE  --  178*   < > 123* 109* 117* 94 78  BUN  --  29*   < > 43* 42* 55* 68* 74*  CREATININE  --  1.99*   < > 1.57* 1.54* 2.33* 2.62* 2.32*  CALCIUM  --  8.7*   < > 8.7* 9.3 9.3 8.7* 8.6*  MG 1.4* 1.9  --   --   --   --   --   --   PHOS  --  4.3  --   --   --   --   --   --    < > = values in this interval not displayed.   GFR: Estimated Creatinine Clearance: 22.5 mL/min (A) (by C-G formula based on SCr of 2.32 mg/dL (H)). Liver Function Tests: Recent Labs  Lab 05/13/20 0749 05/15/20 0102  AST 57* 31  ALT 31 26  ALKPHOS 184* 106  BILITOT 0.8 1.1  PROT 5.4* 5.6*  ALBUMIN 1.9* 2.7*   No results for input(s): LIPASE, AMYLASE in the last 168 hours. Recent Labs  Lab 05/11/20 1116  AMMONIA 39*   Coagulation  Profile: No results for input(s): INR, PROTIME in the last 168 hours. Cardiac Enzymes: No results for input(s): CKTOTAL, CKMB, CKMBINDEX, TROPONINI in the last 168 hours. BNP (last 3 results) No results for input(s): PROBNP in the last 8760 hours. HbA1C: No results for input(s): HGBA1C in the last 72 hours. CBG: No results for input(s): GLUCAP in the last 168 hours. Lipid Profile: No results for input(s): CHOL, HDL, LDLCALC, TRIG, CHOLHDL, LDLDIRECT in the last 72 hours. Thyroid Function Tests:  No results for input(s): TSH, T4TOTAL, FREET4, T3FREE, THYROIDAB in the last 72 hours. Anemia Panel: No results for input(s): VITAMINB12, FOLATE, FERRITIN, TIBC, IRON, RETICCTPCT in the last 72 hours. Sepsis Labs: No results for input(s): PROCALCITON, LATICACIDVEN in the last 168 hours.  Recent Results (from the past 240 hour(s))  Culture, blood (Routine X 2) w Reflex to ID Panel     Status: None   Collection Time: 05/09/20  3:18 PM   Specimen: BLOOD RIGHT HAND  Result Value Ref Range Status   Specimen Description BLOOD RIGHT HAND  Final   Special Requests   Final    BOTTLES DRAWN AEROBIC ONLY Blood Culture results may not be optimal due to an inadequate volume of blood received in culture bottles   Culture   Final    NO GROWTH 5 DAYS Performed at [facility], [place] [address], [place], [place] [address]    Report Status 05/14/2020 FINAL  Final  Culture, blood (Routine X 2) w Reflex to ID Panel     Status: None   Collection Time: 05/09/20  3:24 PM   Specimen: BLOOD RIGHT HAND  Result Value Ref Range Status   Specimen Description BLOOD RIGHT HAND  Final   Special Requests   Final    BOTTLES DRAWN AEROBIC ONLY Blood Culture results may not be optimal due to an inadequate volume of blood received in culture bottles   Culture   Final    NO GROWTH 5 DAYS Performed at [facility], [place] [address] [place], [place] [address]    Report Status 05/14/2020 FINAL  Final          Radiology Studies: No results found.      Scheduled Meds: . (feeding supplement) PROSource Plus  30 mL Oral BID BM  . Chlorhexidine Gluconate Cloth  6 each Topical Daily  . feeding supplement (ENSURE ENLIVE)  237 mL Oral BID BM  . Gerhardt's butt cream   Topical BID  . heparin injection (subcutaneous)  5,000 Units Subcutaneous Q8H  . PARoxetine  40 mg Oral Daily  . polyethylene glycol  17 g Oral Daily   Continuous Infusions: . sodium chloride Stopped (05/13/20 1300)  . albumin human 25 g (05/16/20 1030)     LOS: 16 days    Time spent: 35 min    Nicolette Bang, MD [facility]  If 7PM-7AM, please contact night-coverage  05/16/2020, 12:43 PM

## 2020-05-16 NOTE — Plan of Care (Signed)
  Problem: Health Behavior/Discharge Planning: Goal: Ability to manage health-related needs will improve Outcome: Progressing   Problem: Clinical Measurements: Goal: Ability to maintain clinical measurements within normal limits will improve Outcome: Progressing Goal: Will remain free from infection Outcome: Progressing Goal: Cardiovascular complication will be avoided Outcome: Progressing   Problem: Activity: Goal: Risk for activity intolerance will decrease Outcome: Progressing   Problem: Coping: Goal: Level of anxiety will decrease Outcome: Progressing   Problem: Elimination: Goal: Will not experience complications related to bowel motility Outcome: Progressing Goal: Will not experience complications related to urinary retention Outcome: Progressing

## 2020-05-17 ENCOUNTER — Inpatient Hospital Stay (HOSPITAL_COMMUNITY): Payer: Medicare Other

## 2020-05-17 ENCOUNTER — Inpatient Hospital Stay (HOSPITAL_COMMUNITY): Payer: Medicare Other | Admitting: Anesthesiology

## 2020-05-17 ENCOUNTER — Encounter (HOSPITAL_COMMUNITY): Payer: Self-pay | Admitting: Internal Medicine

## 2020-05-17 ENCOUNTER — Encounter (HOSPITAL_COMMUNITY): Admission: EM | Disposition: E | Payer: Self-pay | Source: Ambulatory Visit | Attending: Internal Medicine

## 2020-05-17 DIAGNOSIS — N179 Acute kidney failure, unspecified: Secondary | ICD-10-CM | POA: Diagnosis not present

## 2020-05-17 DIAGNOSIS — Z9889 Other specified postprocedural states: Secondary | ICD-10-CM

## 2020-05-17 DIAGNOSIS — K659 Peritonitis, unspecified: Secondary | ICD-10-CM

## 2020-05-17 DIAGNOSIS — K7460 Unspecified cirrhosis of liver: Secondary | ICD-10-CM

## 2020-05-17 DIAGNOSIS — K7581 Nonalcoholic steatohepatitis (NASH): Secondary | ICD-10-CM

## 2020-05-17 DIAGNOSIS — J95821 Acute postprocedural respiratory failure: Secondary | ICD-10-CM

## 2020-05-17 HISTORY — PX: COLOSTOMY: SHX63

## 2020-05-17 HISTORY — PX: LAPAROTOMY: SHX154

## 2020-05-17 HISTORY — PX: LYSIS OF ADHESION: SHX5961

## 2020-05-17 LAB — BASIC METABOLIC PANEL
Anion gap: 14 (ref 5–15)
Anion gap: 14 (ref 5–15)
BUN: 69 mg/dL — ABNORMAL HIGH (ref 8–23)
BUN: 71 mg/dL — ABNORMAL HIGH (ref 8–23)
CO2: 17 mmol/L — ABNORMAL LOW (ref 22–32)
CO2: 19 mmol/L — ABNORMAL LOW (ref 22–32)
Calcium: 8.3 mg/dL — ABNORMAL LOW (ref 8.9–10.3)
Calcium: 9.2 mg/dL (ref 8.9–10.3)
Chloride: 103 mmol/L (ref 98–111)
Chloride: 106 mmol/L (ref 98–111)
Creatinine, Ser: 2.25 mg/dL — ABNORMAL HIGH (ref 0.44–1.00)
Creatinine, Ser: 2.65 mg/dL — ABNORMAL HIGH (ref 0.44–1.00)
GFR calc Af Amer: 20 mL/min — ABNORMAL LOW (ref >60)
GFR calc Af Amer: 24 mL/min — ABNORMAL LOW (ref >60)
GFR calc non Af Amer: 17 mL/min — ABNORMAL LOW (ref >60)
GFR calc non Af Amer: 21 mL/min — ABNORMAL LOW (ref >60)
Glucose, Bld: 107 mg/dL — ABNORMAL HIGH (ref 70–99)
Glucose, Bld: 90 mg/dL (ref 70–99)
Potassium: 3.7 mmol/L (ref 3.5–5.1)
Potassium: 4.5 mmol/L (ref 3.5–5.1)
Sodium: 136 mmol/L (ref 135–145)
Sodium: 137 mmol/L (ref 135–145)

## 2020-05-17 LAB — CBC
HCT: 35.7 % — ABNORMAL LOW (ref 36.0–46.0)
HCT: 25.6 % — ABNORMAL LOW (ref 36.0–46.0)
Hemoglobin: 10.7 g/dL — ABNORMAL LOW (ref 12.0–15.0)
Hemoglobin: 7.3 g/dL — ABNORMAL LOW (ref 12.0–15.0)
MCH: 26.8 pg (ref 26.0–34.0)
MCH: 26.6 pg (ref 26.0–34.0)
MCHC: 30 g/dL (ref 30.0–36.0)
MCHC: 28.5 g/dL — ABNORMAL LOW (ref 30.0–36.0)
MCV: 89.3 fL (ref 80.0–100.0)
MCV: 93.4 fL (ref 80.0–100.0)
Platelets: 330 10*3/uL (ref 150–400)
Platelets: 281 10*3/uL (ref 150–400)
RBC: 4 MIL/uL (ref 3.87–5.11)
RBC: 2.74 MIL/uL — ABNORMAL LOW (ref 3.87–5.11)
RDW: 27 % — ABNORMAL HIGH (ref 11.5–15.5)
RDW: 26.8 % — ABNORMAL HIGH (ref 11.5–15.5)
WBC: 19.5 10*3/uL — ABNORMAL HIGH (ref 4.0–10.5)
WBC: 21.7 10*3/uL — ABNORMAL HIGH (ref 4.0–10.5)
nRBC: 0 % (ref 0.0–0.2)
nRBC: 0 % (ref 0.0–0.2)

## 2020-05-17 LAB — GLUCOSE, CAPILLARY: Glucose-Capillary: 65 mg/dL — ABNORMAL LOW (ref 70–99)

## 2020-05-17 LAB — POCT I-STAT 7, (LYTES, BLD GAS, ICA,H+H)
Acid-base deficit: 9 mmol/L — ABNORMAL HIGH (ref 0.0–2.0)
Bicarbonate: 18 mmol/L — ABNORMAL LOW (ref 20.0–28.0)
Calcium, Ion: 1.21 mmol/L (ref 1.15–1.40)
HCT: 24 % — ABNORMAL LOW (ref 36.0–46.0)
Hemoglobin: 8.2 g/dL — ABNORMAL LOW (ref 12.0–15.0)
O2 Saturation: 93 %
Patient temperature: 97.3
Potassium: 4.1 mmol/L (ref 3.5–5.1)
Sodium: 137 mmol/L (ref 135–145)
TCO2: 19 mmol/L — ABNORMAL LOW (ref 22–32)
pCO2 arterial: 43.7 mmHg (ref 32.0–48.0)
pH, Arterial: 7.218 — ABNORMAL LOW (ref 7.350–7.450)
pO2, Arterial: 77 mmHg — ABNORMAL LOW (ref 83.0–108.0)

## 2020-05-17 LAB — PROTIME-INR
INR: 1.9 — ABNORMAL HIGH (ref 0.8–1.2)
Prothrombin Time: 20.7 seconds — ABNORMAL HIGH (ref 11.4–15.2)

## 2020-05-17 LAB — APTT: aPTT: 40 seconds — ABNORMAL HIGH (ref 24–36)

## 2020-05-17 LAB — LACTIC ACID, PLASMA
Lactic Acid, Venous: 2.5 mmol/L (ref 0.5–1.9)
Lactic Acid, Venous: 2.6 mmol/L (ref 0.5–1.9)

## 2020-05-17 LAB — AMMONIA: Ammonia: 40 umol/L — ABNORMAL HIGH (ref 9–35)

## 2020-05-17 LAB — MAGNESIUM: Magnesium: 2.2 mg/dL (ref 1.7–2.4)

## 2020-05-17 LAB — PROCALCITONIN: Procalcitonin: 3.91 ng/mL

## 2020-05-17 SURGERY — LAPAROTOMY, EXPLORATORY
Anesthesia: General | Site: Abdomen

## 2020-05-17 MED ORDER — DEXAMETHASONE SODIUM PHOSPHATE 10 MG/ML IJ SOLN
INTRAMUSCULAR | Status: AC
  Filled 2020-05-17: qty 1

## 2020-05-17 MED ORDER — ARTIFICIAL TEARS OPHTHALMIC OINT
TOPICAL_OINTMENT | OPHTHALMIC | Status: AC
  Filled 2020-05-17: qty 3.5

## 2020-05-17 MED ORDER — EPHEDRINE SULFATE 50 MG/ML IJ SOLN
INTRAMUSCULAR | Status: DC | PRN
  Administered 2020-05-17 (×2): 10 mg via INTRAVENOUS

## 2020-05-17 MED ORDER — SUGAMMADEX SODIUM 200 MG/2ML IV SOLN
INTRAVENOUS | Status: DC | PRN
  Administered 2020-05-17: 200 mg via INTRAVENOUS

## 2020-05-17 MED ORDER — FENTANYL CITRATE (PF) 100 MCG/2ML IJ SOLN
25.0000 ug | INTRAMUSCULAR | Status: DC | PRN
  Administered 2020-05-18 – 2020-05-19 (×3): 100 ug via INTRAVENOUS
  Administered 2020-05-20 – 2020-05-21 (×3): 50 ug via INTRAVENOUS
  Filled 2020-05-17 (×6): qty 2

## 2020-05-17 MED ORDER — PROPOFOL 500 MG/50ML IV EMUL
INTRAVENOUS | Status: DC | PRN
  Administered 2020-05-17: 30 ug/kg/min via INTRAVENOUS

## 2020-05-17 MED ORDER — HEMOSTATIC AGENTS (NO CHARGE) OPTIME
TOPICAL | Status: DC | PRN
  Administered 2020-05-17: 1

## 2020-05-17 MED ORDER — ORAL CARE MOUTH RINSE
15.0000 mL | OROMUCOSAL | Status: DC
  Administered 2020-05-18 – 2020-05-19 (×14): 15 mL via OROMUCOSAL

## 2020-05-17 MED ORDER — VANCOMYCIN HCL 750 MG/150ML IV SOLN
750.0000 mg | INTRAVENOUS | Status: DC
  Administered 2020-05-18: 750 mg via INTRAVENOUS
  Filled 2020-05-17: qty 150

## 2020-05-17 MED ORDER — DEXAMETHASONE SODIUM PHOSPHATE 10 MG/ML IJ SOLN
INTRAMUSCULAR | Status: DC | PRN
  Administered 2020-05-17: 10 mg via INTRAVENOUS

## 2020-05-17 MED ORDER — ROCURONIUM BROMIDE 10 MG/ML (PF) SYRINGE
PREFILLED_SYRINGE | INTRAVENOUS | Status: AC
  Filled 2020-05-17: qty 10

## 2020-05-17 MED ORDER — CHLORHEXIDINE GLUCONATE 0.12 % MT SOLN
OROMUCOSAL | Status: AC
  Administered 2020-05-17: 15 mL via OROMUCOSAL
  Filled 2020-05-17: qty 15

## 2020-05-17 MED ORDER — SODIUM BICARBONATE 8.4 % IV SOLN
INTRAVENOUS | Status: DC
  Filled 2020-05-17: qty 150
  Filled 2020-05-17 (×3): qty 850
  Filled 2020-05-17: qty 150

## 2020-05-17 MED ORDER — LIDOCAINE HCL (CARDIAC) PF 100 MG/5ML IV SOSY
PREFILLED_SYRINGE | INTRAVENOUS | Status: DC | PRN
  Administered 2020-05-17: 60 mg via INTRATRACHEAL

## 2020-05-17 MED ORDER — EPHEDRINE 5 MG/ML INJ
INTRAVENOUS | Status: AC
  Filled 2020-05-17: qty 10

## 2020-05-17 MED ORDER — METRONIDAZOLE IN NACL 5-0.79 MG/ML-% IV SOLN
500.0000 mg | Freq: Three times a day (TID) | INTRAVENOUS | Status: DC
  Administered 2020-05-18 – 2020-05-26 (×27): 500 mg via INTRAVENOUS
  Filled 2020-05-17 (×27): qty 100

## 2020-05-17 MED ORDER — LACTATED RINGERS IV BOLUS (SEPSIS)
1000.0000 mL | Freq: Once | INTRAVENOUS | Status: AC
  Administered 2020-05-17: 1000 mL via INTRAVENOUS

## 2020-05-17 MED ORDER — CHLORHEXIDINE GLUCONATE 0.12% ORAL RINSE (MEDLINE KIT)
15.0000 mL | Freq: Two times a day (BID) | OROMUCOSAL | Status: DC
  Administered 2020-05-18 – 2020-05-19 (×4): 15 mL via OROMUCOSAL

## 2020-05-17 MED ORDER — SODIUM CHLORIDE 0.9 % IV SOLN: 2.0000 g | INTRAVENOUS | Status: DC

## 2020-05-17 MED ORDER — ONDANSETRON HCL 4 MG/2ML IJ SOLN
INTRAMUSCULAR | Status: DC | PRN
  Administered 2020-05-17: 4 mg via INTRAVENOUS

## 2020-05-17 MED ORDER — VANCOMYCIN HCL 1500 MG/300ML IV SOLN
1500.0000 mg | Freq: Once | INTRAVENOUS | Status: AC
  Administered 2020-05-17: 1500 mg via INTRAVENOUS
  Filled 2020-05-17: qty 300

## 2020-05-17 MED ORDER — SODIUM CHLORIDE 0.9 % IV SOLN
2.0000 g | Freq: Once | INTRAVENOUS | Status: DC
  Filled 2020-05-17: qty 2

## 2020-05-17 MED ORDER — PROPOFOL 1000 MG/100ML IV EMUL
INTRAVENOUS | Status: AC
  Filled 2020-05-17: qty 100

## 2020-05-17 MED ORDER — ROCURONIUM 10MG/ML (10ML) SYRINGE FOR MEDFUSION PUMP - OPTIME
INTRAVENOUS | Status: DC | PRN
  Administered 2020-05-17: 10 mg via INTRAVENOUS
  Administered 2020-05-17: 40 mg via INTRAVENOUS

## 2020-05-17 MED ORDER — VITAMIN K1 10 MG/ML IJ SOLN
10.0000 mg | Freq: Every day | INTRAVENOUS | Status: AC
  Administered 2020-05-18 – 2020-05-19 (×2): 10 mg via INTRAVENOUS
  Filled 2020-05-17 (×2): qty 1

## 2020-05-17 MED ORDER — PROPOFOL 10 MG/ML IV BOLUS
INTRAVENOUS | Status: DC | PRN
  Administered 2020-05-17: 100 mg via INTRAVENOUS

## 2020-05-17 MED ORDER — SUCCINYLCHOLINE CHLORIDE 20 MG/ML IJ SOLN
INTRAMUSCULAR | Status: DC | PRN
  Administered 2020-05-17: 80 mg via INTRAVENOUS

## 2020-05-17 MED ORDER — SODIUM CHLORIDE 0.9 % IV SOLN
2.0000 g | INTRAVENOUS | Status: DC
  Administered 2020-05-17 – 2020-05-21 (×5): 2 g via INTRAVENOUS
  Filled 2020-05-17 (×4): qty 2

## 2020-05-17 MED ORDER — DEXTROSE 50 % IV SOLN
INTRAVENOUS | Status: AC
  Filled 2020-05-17: qty 50

## 2020-05-17 MED ORDER — FENTANYL CITRATE (PF) 250 MCG/5ML IJ SOLN
INTRAMUSCULAR | Status: DC | PRN
  Administered 2020-05-17: 100 ug via INTRAVENOUS

## 2020-05-17 MED ORDER — LACTATED RINGERS IV BOLUS (SEPSIS): 500.0000 mL | Freq: Once | INTRAVENOUS | Status: DC

## 2020-05-17 MED ORDER — ALBUMIN HUMAN 25 % IV SOLN
25.0000 g | Freq: Once | INTRAVENOUS | Status: AC
  Administered 2020-05-18: 25 g via INTRAVENOUS
  Filled 2020-05-17: qty 100

## 2020-05-17 MED ORDER — SODIUM CHLORIDE 0.9 % IV SOLN: INTRAVENOUS | Status: DC

## 2020-05-17 MED ORDER — SODIUM CHLORIDE 0.9% IV SOLUTION: Freq: Once | INTRAVENOUS | Status: AC

## 2020-05-17 MED ORDER — PHENYLEPHRINE HCL-NACL 10-0.9 MG/250ML-% IV SOLN
INTRAVENOUS | Status: AC
  Filled 2020-05-17: qty 250

## 2020-05-17 MED ORDER — LACTATED RINGERS IV BOLUS (SEPSIS): 1000.0000 mL | Freq: Once | INTRAVENOUS | Status: DC

## 2020-05-17 MED ORDER — FENTANYL CITRATE (PF) 250 MCG/5ML IJ SOLN
INTRAMUSCULAR | Status: AC
  Filled 2020-05-17: qty 5

## 2020-05-17 MED ORDER — CHLORHEXIDINE GLUCONATE 0.12 % MT SOLN: 15.0000 mL | Freq: Once | OROMUCOSAL | Status: AC

## 2020-05-17 MED ORDER — ALBUMIN HUMAN 5 % IV SOLN: INTRAVENOUS | Status: DC | PRN

## 2020-05-17 MED ORDER — PANTOPRAZOLE SODIUM 40 MG IV SOLR
40.0000 mg | INTRAVENOUS | Status: DC
  Administered 2020-05-17 – 2020-05-25 (×9): 40 mg via INTRAVENOUS
  Filled 2020-05-17 (×9): qty 40

## 2020-05-17 MED ORDER — DEXTROSE 50 % IV SOLN: 12.5000 g | INTRAVENOUS | Status: AC

## 2020-05-17 MED ORDER — 0.9 % SODIUM CHLORIDE (POUR BTL) OPTIME
TOPICAL | Status: DC | PRN
  Administered 2020-05-17 (×2): 1000 mL

## 2020-05-17 MED ORDER — PHENYLEPHRINE HCL-NACL 10-0.9 MG/250ML-% IV SOLN
INTRAVENOUS | Status: DC | PRN
  Administered 2020-05-17: 50 ug/min via INTRAVENOUS

## 2020-05-17 MED ORDER — DEXTROSE 50 % IV SOLN
12.5000 g | INTRAVENOUS | Status: AC
  Administered 2020-05-17: 12.5 g via INTRAVENOUS

## 2020-05-17 MED ORDER — MIDAZOLAM HCL 2 MG/2ML IJ SOLN
INTRAMUSCULAR | Status: AC
  Filled 2020-05-17: qty 2

## 2020-05-17 SURGICAL SUPPLY — 51 items
APL PRP STRL LF DISP 70% ISPRP (MISCELLANEOUS) ×3
BLADE CLIPPER SURG (BLADE) IMPLANT
BNDG GAUZE ELAST 4 BULKY (GAUZE/BANDAGES/DRESSINGS) ×2 IMPLANT
CANISTER SUCT 3000ML PPV (MISCELLANEOUS) ×7 IMPLANT
CATH ROBINSON RED A/P 14FR (CATHETERS) ×2 IMPLANT
CHLORAPREP W/TINT 26 (MISCELLANEOUS) ×5 IMPLANT
COVER SURGICAL LIGHT HANDLE (MISCELLANEOUS) ×5 IMPLANT
COVER WAND RF STERILE (DRAPES) ×5 IMPLANT
DRAIN CHANNEL 19F RND (DRAIN) ×2 IMPLANT
DRAPE LAPAROSCOPIC ABDOMINAL (DRAPES) ×5 IMPLANT
DRAPE WARM FLUID 44X44 (DRAPES) ×5 IMPLANT
DRSG OPSITE POSTOP 4X10 (GAUZE/BANDAGES/DRESSINGS) IMPLANT
DRSG OPSITE POSTOP 4X8 (GAUZE/BANDAGES/DRESSINGS) IMPLANT
ELECT BLADE 6.5 EXT (BLADE) ×2 IMPLANT
ELECT CAUTERY BLADE 6.4 (BLADE) ×5 IMPLANT
ELECT REM PT RETURN 9FT ADLT (ELECTROSURGICAL) ×5
ELECTRODE REM PT RTRN 9FT ADLT (ELECTROSURGICAL) ×3 IMPLANT
EVACUATOR SILICONE 100CC (DRAIN) ×2 IMPLANT
GAUZE SPONGE 4X4 12PLY STRL LF (GAUZE/BANDAGES/DRESSINGS) ×2 IMPLANT
GLOVE BIO SURGEON STRL SZ 6 (GLOVE) ×5 IMPLANT
GLOVE INDICATOR 6.5 STRL GRN (GLOVE) ×5 IMPLANT
GOWN STRL REUS W/ TWL LRG LVL3 (GOWN DISPOSABLE) ×6 IMPLANT
GOWN STRL REUS W/TWL LRG LVL3 (GOWN DISPOSABLE) ×10
HANDLE SUCTION POOLE (INSTRUMENTS) ×3 IMPLANT
HEMOSTAT ARISTA ABSORB 3G PWDR (HEMOSTASIS) ×2 IMPLANT
HEMOSTAT SNOW SURGICEL 2X4 (HEMOSTASIS) ×2 IMPLANT
KIT BASIN OR (CUSTOM PROCEDURE TRAY) ×5 IMPLANT
KIT OSTOMY DRAINABLE 2.75 STR (WOUND CARE) ×2 IMPLANT
KIT TURNOVER KIT B (KITS) ×5 IMPLANT
LIGASURE IMPACT 36 18CM CVD LR (INSTRUMENTS) IMPLANT
NS IRRIG 1000ML POUR BTL (IV SOLUTION) ×10 IMPLANT
PACK GENERAL/GYN (CUSTOM PROCEDURE TRAY) ×5 IMPLANT
PAD ABD 8X10 STRL (GAUZE/BANDAGES/DRESSINGS) ×2 IMPLANT
PAD ARMBOARD 7.5X6 YLW CONV (MISCELLANEOUS) ×5 IMPLANT
PENCIL SMOKE EVACUATOR (MISCELLANEOUS) ×5 IMPLANT
SPECIMEN JAR LARGE (MISCELLANEOUS) IMPLANT
SPONGE LAP 18X18 RF (DISPOSABLE) ×8 IMPLANT
STAPLER VISISTAT 35W (STAPLE) ×5 IMPLANT
SUCTION POOLE HANDLE (INSTRUMENTS) ×5
SUT ETHILON 2 0 FS 18 (SUTURE) ×2 IMPLANT
SUT NOVA 1 T20/GS 25DT (SUTURE) ×2 IMPLANT
SUT PDS AB 1 TP1 96 (SUTURE) ×10 IMPLANT
SUT SILK 2 0 SH CR/8 (SUTURE) ×5 IMPLANT
SUT SILK 2 0 TIES 10X30 (SUTURE) ×5 IMPLANT
SUT SILK 3 0 SH CR/8 (SUTURE) ×5 IMPLANT
SUT SILK 3 0 TIES 10X30 (SUTURE) ×5 IMPLANT
SUT VIC AB 3-0 SH 18 (SUTURE) ×4 IMPLANT
TAPE CLOTH SURG 4X10 WHT LF (GAUZE/BANDAGES/DRESSINGS) ×2 IMPLANT
TOWEL GREEN STERILE (TOWEL DISPOSABLE) ×5 IMPLANT
TRAY FOLEY MTR SLVR 14FR STAT (SET/KITS/TRAYS/PACK) ×2 IMPLANT
TRAY FOLEY MTR SLVR 16FR STAT (SET/KITS/TRAYS/PACK) IMPLANT

## 2020-05-17 NOTE — Op Note (Signed)
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) Post-op diagnosis/intraop findings: Large volume pneumoperitoneum, 5 L of ascites with phlegmonous change in the left upper quadrant and around the colonic anastomosis on the left side, extensive inflammatory adhesions as expected  Specimens: No Retained items: 23 French round Blake drain extends into the pelvis and along the left colic gutter, overlying the colonic anastomosis EBL: 67TI Complications: none  Description of procedure: After obtaining informed consent the patient was taken to the operating room and placed supine on operating room table wheregeneral endotracheal anesthesia was initiated, preoperative antibiotics were administered, SCDs applied, and a formal timeout was performed.  The abdomen was prepped and draped in the usual sterile fashion and the previous midline incision was opened, upon which an audible rush of air and a large volume (4 to 5 L) of serous ascites was evacuated.  The small bowel was encased in inflammatory adhesions, most of which were able to be bluntly freed in order to expose the pelvis and left hemiabdomen.  Upon doing so we encountered purulent rind and phlegmonous change surrounding the colonic anastomosis but no obvious suture line dehiscence or perforation in this area.  Progressing towards left upper quadrant, there is phlegmonous change and induration of the tissues but the proximal colon appears healthy and viable.  There is no overt succus or fecal matter but a fair amount of purulence present most prominent in the left side of the abdomen.  The stomach and  proximal duodenum were inspected and there was no evidence of perforation here.  Small bowel interloop adhesions and adhesions of the small bowel to the pelvis were carefully broken apart bluntly and there did not appear to be any small bowel perforation although this examination was limited as adhesiolysis was terminated where more firm and less friable adhesions were encountered in order to minimize risk of bowel injury.  There is diffuse surface area of bleeding from the peritoneum in the pelvis where adhesions were swept away as well as some of the serosa of the small bowel. The area of the colonic anastomosis where the suspected leak is is essentially socked in with dense inflammatory phlegmonous change, and I really could not safely dissect this out or identify healthy distal colon without undue force on the tissues which would guarantee an enterotomy or further tissue damage.  Given the small bowel interloop inflammatory adhesions, identifying a good region of terminal ileum also did not seem like a safe option.  The transverse colon is fairly redundant and appears healthy and therefore we proceeded to create a loop transverse colostomy in the left upper quadrant, after dividing some of the omentum off of the bowel. This was brought through the colostomy site we created in the left upper quadrant and a red rubber catheter threaded through the mesentery to act as a bridge. The abdomen was irrigated with warm sterile saline. There is continued surface bleeding in the pelvis, Arista was placed. A 19 French round Blake drain was introduced through the right lower quadrant where it is secured to the skin with a 2-0 nylon, and the drain courses through the pelvis and along the left colic gutter overlying the colonic anastomosis. This will be placed  to bulb suction. The abdomen was inspected, no other bowel injuries were identified and there was no surgical bleeding present. The fascia was reapproximated with looped  #1 PDS starting at either end and tying centrally, with interspersed retention sutures of #1 Novafil. A wet-to-dry dressing is placed on the wound. The colostomy is matured in the standard fashion and confirmed by digital intubation to be widely patent through the fascia. The patient will be transferred to the ICU in critical condition and will remain intubated.   All counts were correct at the completion of the case.

## 2020-05-17 NOTE — Progress Notes (Signed)
Per bedside RN blood cultures were drawn prior to antibiotics being   hung. Do not see blood cultures documented yet

## 2020-05-17 NOTE — Consult Note (Signed)
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  PCCM consulted for postop ICU/vent management  History of present illness   74 yo female with PMH anxiety, hypertension and renal stones presented to the emergency room on 9/3 for shortness of breath x3 to 4 weeks. Patient underwent colonoscopy and EGD on 9/6.  Found to have a sigmoid colon mass, partially obstructing plus multiple polyps.  General surgery consulted.  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  Patient underwent a left hemicolectomy as well as a liver biopsy on 9/9.  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   9/20 developed worsening pain, shortness of breath and leukocytosis, chest x-ray showed new pneumoperitoneum.  After discussion with her sisters regarding high peri-operative mortality , she underwent laparotomy, area of anastomotic leak was identified, adhesions were lysed and transverse colostomy was created.  She required 750 cc of albumin and 2 L of crystalloid during the procedure.  No blood was given.  She required Neo-Synephrine during the procedure but was weaned off and transferred to the ICU intubated and sedated on propofol. PCCM consulted for postop ICU management  Past Medical History  Renal calculi New diagnosis of colon cancer and child B cirrhosis during this admission   Significant Hospital Events   9/20 >> pneumoperitoneum  Consults:  General surgery  Procedures:  9/6 colonoscopy 9/9 left  hemicolectomy 9/20 exploratory laparotomy, LOA, abdominal washout, transverse loop colostomy, JP drain into pelvis and left colic gutter Intra-Op findings: Large volume pneumoperitoneum, 5 L of ascites with phlegmonous change in the left upper quadrant and around the colonic anastomosis on the left side, extensive inflammatory adhesions  Significant Diagnostic Tests:  CT abdomen/pelvis 9/6 Wall thickening focally involving the distal descending colon, most likely the site of primary. No dominant sigmoid mass identified. 2. No findings of metastatic disease within the abdomen or pelvis. 3. Bilateral pleural effusions and ascites 4. Hepatic morphology which suggests cirrhosis.  5. Bilateral nephrolithiasis.  US renal 9/14 >> no hydronephrosis, left renal atrophy, multiple small bilateral calculi, small ascites  Micro Data:  Peritoneal fluid 9/20>> [facility] 9/20 >> BC 9/12 >> ng Urine 9/5 >> Enterococcus faecalis  Antimicrobials:  Cefepime 9/20>> Flagyl 9/20>> Vancomycin 9/20 >>  Interim history/subjective:    Objective   Blood pressure (!) 118/45, pulse 72, temperature (!) 97.3 F (36.3 C), temperature source Oral, resp. rate 16, height 5' 4.02" (1.626 m), weight 80.2 kg, SpO2 100 %.    Vent Mode: PRVC FiO2 (%):  [40 %] 40 % Set Rate:  [16 bmp] 16 bmp Vt Set:  [440 mL] 440 mL PEEP:  [5 cmH20] 5 cmH20 Plateau Pressure:  [16 cmH20] 16 cmH20   Intake/Output Summary (Last 24 hours) at 05/05/2020 2318 Last data filed at 05/14/2020 2300 Gross per 24 hour  Intake 2996.24 ml  Output 1010 ml  Net 1986.24 ml   Filed Weights   05/16/20 0353 04/29/2020 0317 05/23/2020 1855  Weight: 82.7 kg 80.2 kg 80.2 kg    Examination: General: Elderly woman, orally intubated, sedated on propofol, no distress HENT:  Oral bite-block, oral ETT, mild pallor, no icterus Lungs: Bilateral ventilated breath sounds, decreased on right base, no rhonchi no accessory muscle use Cardiovascular: S1-S2  regular Abdomen: Soft distended, abdominal binder, right JP drain with serosanguineous Extremities: Cool, no deformity, no edema Neuro: Sedated on propofol, RASS 0 to -1 opens eyes to name GU: Minimal clear urine  Chest x-ray 9/20 independently reviewed which shows ET tube and OG tube in position with right lower lobe infiltrate/effusion  Resolved Hospital Problem list     Assessment & Plan:   Anastomotic leak post left hemicolectomy in this elderly woman with new diagnosis of cirrhosis, meld score of 17.  Postop respiratory failure -vent settings reviewed and adjusted. ABG chest x-ray reviewed. Start spontaneous breathing trials in a.m. with goal to hopefully extubate  Postop pain/sedation -use fentanyl as needed, goal RASS 0 to -1, okay to use low-dose propofol if hemodynamically tolerated -If she develops hypotension will use volume first and then pressors. -Postop colostomy care per surgery  Peritonitis -cefepime and Flagyl should be adequate, given contaminated ascites will need longer term antibiotics, will await culture data  Acute blood loss anemia -hemoglobin has dropped to 7.3 postop and JP drain is putting out serosanguineous blood, will transfuse 1 unit PRBC INR 1.9, will give vitamin K x1  AKI -normal creatinine on admission, peak creatinine of 2.6 on 9/18 and now gradually decreasing to 2.2 , nonoliguric Metabolic acidosis -will use bicarb as maintenance fluid  Cirrhosis -supportive care ascites is minimal, expect JP drain to put out  Goals of care -high perioperative mortality has been discussed with family, further discussion based on progress  Best practice:  Diet: NPO Pain/Anxiety/Delirium protocol (if indicated): Propofol/fentanyl VAP protocol (if indicated): Y DVT prophylaxis: Subcu heparin GI prophylaxis: Protonix Glucose control: N/A Mobility: Bedrest Code Status: Full code Family Communication: Per surgery Disposition: ICU   Labs   CBC: Recent  Labs  Lab 05/13/20 0204 05/13/20 0204 05/14/20 0016 05/15/20 0102 05/16/20 0315 05/25/2020 0046 05/24/2020 2257  WBC 14.6*  --  15.9* 13.0* 13.5* 19.5*  --   HGB 11.1*   < > 10.7* 9.6* 9.0* 10.7* 8.2*  HCT 37.7   < > 36.1 31.4* 30.0* 35.7* 24.0*  MCV 89.3  --  87.8 87.7 89.3 89.3  --   PLT 219  --  246 297 286 330  --    < > = values in this interval not displayed.    Basic Metabolic Panel: Recent Labs  Lab 05/13/20 0204 05/13/20 0204 05/14/20 0016 05/15/20 0102 05/16/20 0315 05/19/2020 0046 05/11/2020 2257  NA 133*   < > 129* 130* 132* 136 137  K 3.7   < > 4.2 4.2 3.5 3.7 4.1  CL 103  --  98 98 99 103  --   CO2 20*  --  20* 20* 20* 19*  --   GLUCOSE 109*  --  117* 94 78 90  --   BUN 42*  --  55* 68* 74* 69*  --   CREATININE 1.54*  --  2.33* 2.62* 2.32* 2.25*  --   CALCIUM 9.3  --  9.3 8.7* 8.6* 9.2  --    < > = values in this interval not displayed.   GFR: Estimated Creatinine Clearance: 22.8 mL/min (A) (by C-G formula based on SCr of 2.25 mg/dL (H)). Recent Labs  Lab 05/14/20 0016 05/15/20 0102 05/16/20 0315 05/24/2020 0046 05/10/2020 1221 05/05/2020 1844  PROCALCITON  --   --   --   --  3.91  --   WBC 15.9* 13.0* 13.5* 19.5*  --   --   LATICACIDVEN  --   --   --   --  2.5* 2.6*    Liver Function Tests: Recent Labs  Lab 05/13/20 0749 05/15/20 0102  AST 57* 31  ALT 31 26  ALKPHOS 184* 106  BILITOT 0.8 1.1  PROT 5.4* 5.6*  ALBUMIN 1.9* 2.7*   No results for input(s): LIPASE, AMYLASE in the last 168 hours. Recent Labs  Lab 05/11/20 1116 05/05/2020 1221  AMMONIA 39* 40*    ABG    Component Value Date/Time   PHART 7.218 (L) 05/22/2020 2257   PCO2ART 43.7 04/28/2020 2257   PO2ART 77 (L) 05/08/2020 2257   HCO3 18.0 (L) 05/12/2020 2257   TCO2 19 (L) 05/01/2020 2257   ACIDBASEDEF 9.0 (H) 05/20/2020 2257   O2SAT 93.0 05/12/2020 2257     Coagulation Profile: No results for input(s): INR, PROTIME in the last 168 hours.  Cardiac Enzymes: No results for  input(s): CKTOTAL, CKMB, CKMBINDEX, TROPONINI in the last 168 hours.  HbA1C: No results found for: HGBA1C  CBG: No results for input(s): GLUCAP in the last 168 hours.  Review of Systems:   Unable to obtain since intubated and sedated  Past Medical History  She,  has a past medical history of Anxiety, Arthritis, Dysrhythmia, History of kidney stones, Hypertension, Pneumonia (last 10 yrs ago), and PONV (postoperative nausea and vomiting).   Surgical History    Past Surgical History:  Procedure Laterality Date  . BIOPSY  05/17/2020   Procedure: BIOPSY;  Surgeon: Ladene Artist, MD;  Location: [place];  Service: Endoscopy;;  . COLONOSCOPY WITH PROPOFOL N/A 05/19/2020   Procedure: COLONOSCOPY WITH PROPOFOL;  Surgeon: Ladene Artist, MD;  Location: [facility] ENDOSCOPY;  Service: Endoscopy;  Laterality: N/A;  . CYSTOSCOPY W/ URETERAL STENT PLACEMENT Bilateral 08/03/2016   Procedure: CYSTOSCOPY WITH BILATERAL STENT REPLACEMENT, BILATERAL RETROGRADE PYELOGRAM;  Surgeon: Kathie Rhodes, MD;  Location: [facility];  Service: Urology;  Laterality: Bilateral;  . CYSTOSCOPY/RETROGRADE/URETEROSCOPY/STONE EXTRACTION WITH BASKET Bilateral 09/08/2016   Procedure: BILATERAL CYSTOSCOPY/RETROGRADE/URETEROSCOPY/STONE EXTRACTION / HOLMIUM LASER /STENT REMOVAL;  Surgeon: Kathie Rhodes, MD;  Location: [facility];  Service: Urology;  Laterality: Bilateral;  . CYSTOSCOPY/URETEROSCOPY/HOLMIUM LASER/STENT PLACEMENT Right 09/23/2018   Procedure: CYSTOSCOPY/RETROGRADE/URETEROSCOPY/STENT PLACEMENT RIGHT ;  Surgeon: Kathie Rhodes, MD;  Location: [facility];  Service: Urology;  Laterality: Right;  . HEMOSTASIS CLIP PLACEMENT  05/15/2020   Procedure: HEMOSTASIS CLIP PLACEMENT;  Surgeon: Ladene Artist, MD;  Location: [facility] ENDOSCOPY;  Service: Endoscopy;;  . LAPAROTOMY N/A 05/04/2020   Procedure: EXPLORATORY LAPAROTOMY;  Surgeon: Donnie Mesa, MD;  Location: [place];  Service: General;  Laterality: N/A;  . LIVER BIOPSY N/A  04/29/2020   Procedure: LIVER BIOPSY;  Surgeon: Donnie Mesa, MD;  Location: [place];  Service: General;  Laterality: N/A;  . PARTIAL COLECTOMY N/A 05/23/2020   Procedure: PARTIAL COLECTOMY;  Surgeon: Donnie Mesa, MD;  Location: [place];  Service: General;  Laterality: N/A;  . polyp removed from uterus    . POLYPECTOMY  04/30/2020   Procedure: POLYPECTOMY;  Surgeon: Ladene Artist, MD;  Location[facility];  Service: Endoscopy;;  . SUBMUCOSAL TATTOO INJECTION  05/03/2020   Procedure: SUBMUCOSAL TATTOO INJECTION;  Surgeon: Ladene Artist, MD;  Location: [facility] ENDOSCOPY;  Service: Endoscopy;;     Social History   reports that she has never smoked. She has never used smokeless tobacco. She reports current alcohol use  of about 14.0 standard drinks of alcohol per week. She reports that she does not use drugs.   Family History   Her family history is not on file.   Allergies Allergies  Allergen Reactions  . Sulfa Antibiotics Rash    Rash on legs     Home Medications  Prior to Admission medications   Medication Sig Start Date End Date Taking? Authorizing Provider  PARoxetine (PAXIL) 40 MG tablet TAKE 1 TABLET(40 MG) BY MOUTH EVERY MORNING Patient taking differently: Take 40 mg by mouth daily.  05/05/2020  Yes Hurst, Dorothea Glassman, PA-C  acetaminophen (TYLENOL) 325 MG tablet Take 2 tablets (650 mg total) by mouth every 6 (six) hours as needed for mild pain (or Fever >/= 101). 05/12/20   Norm Parcel, PA-C  ciprofloxacin (CIPRO) 500 MG tablet Take 500 mg by mouth 2 (two) times daily. 05/01/2020   [provider]  methocarbamol (ROBAXIN) 500 MG tablet Take 1 tablet (500 mg total) by mouth every 8 (eight) hours as needed for muscle spasms. 05/12/20   Norm Parcel, PA-C  oxyCODONE (OXY IR/ROXICODONE) 5 MG immediate release tablet Take 1 tablet (5 mg total) by mouth every 6 (six) hours as needed for moderate pain or severe pain. 05/12/20   Norm Parcel, PA-C     Critical care time: 51m      Kara Mead MD. Shade Flood. [facility] See Amion for pager  If no response to pager , please call [phone]  After 7:00 pm call Elink  [phone]   05/22/2020

## 2020-05-17 NOTE — Progress Notes (Addendum)
PROGRESS NOTE  Regina Richards [facility] PNT:[id number] DOB: June 02, 1946 DOA: 05/01/2020 PCP: Patient, No Pcp Per  HPI/Recap of past 23 hours: 74 year old female with past medical history of anxiety, hypertension and renal stones presented to the emergency room on 9/3 for shortness of breath has been going on for 3 to 4 weeks and at time of admission, found to have UTI and a BNP of 235 with a hemoglobin of 2.9, MCV of 75 and platelet count of 98.  Admitted to the hospitalist service.  Case discussed with hematology as there is no evidence of acute bleeding. Work-up revealed severe iron deficiency anemia and following transfusion of 3 units packed red blood cells and Feraheme, hemoglobin improved to 7.2.  Gastroenterology consulted and given patient's age and unexplained iron deficiency anemia, concerns for colonic malignancy.  Patient underwent colonoscopy and EGD on 9/6.  Found to have a sigmoid colon mass, partially obstructing plus multiple polyps.  General surgery consulted.  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  Patient underwent a left hemicolectomy as well as a liver biopsy on 9/9.  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  Postop, patient developed acute kidney injury.  Baseline creatinine normal, but following surgery started to trend upward and by 9/18 up to 2.62.  Patient reported voiding without difficulty.  Renal ultrasound noted left renal atrophy and nephrology consulted.  It was suspected that with her poor p.o. intake she may have had transient hypovolemia complicated by hypoalbuminemia.  Nephrology recommended giving patient 2 days of IV albumin for volume expansion, which patient responded to with increased urine output.  Today, creatinine continues to improve, today at 2.25 down from peak of 2.62 on 9/18.  Patient's white blood cell count increased to 19.5 and anion gap at 14.  Patient herself  resting comfortably with no complaints.   Assessment/Plan: Principal Problem:   Symptomatic iron deficient anemia: Secondary to colonic mass.  Treated with blood transfusion and IV iron.  Hemoglobin has remained stable.  No evidence of acute bleeding.  Active Problems: Elevated blood pressure: Blood pressure has been elevated at times although not consistently.  No history of hypertension and not on any home medications.  Depression: On Paxil.  Acute kidney injury: No previous history of chronic kidney disease: Stable.     History of kidney stones: Incidentally noted  Leukocytosis/metabolic acidosis: Possibly from uremia although would expect with her creatinine improving, acidosis should be trending downward.  Check procalcitonin.  Check lactic acid level.  Invasive adenocarcinoma of the colon: Status post left hemicolectomy 9/9.  No positive lymph nodes.  Patient will follow up with general surgery as outpatient plus outpatient oncology.  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  Outpatient GI follow-up.  Diagnosed by biopsy.  Check ammonia level  Obesity: Patient meets criteria with BMI greater than 30  Code Status: Full code  Family Communication: Left message for sister  Disposition Plan: Skilled nursing as per recommendation of PT/OT   Consultants:  Hematology  Gastroenterology  General surgery  Nephrology  Procedures:  Transfusion 3 units packed red blood cells  Colonoscopy and EGD noting partially obstructing sigmoid mass on colonoscopy plus multiple polyps.  Biopsies taken.  Status post hemicolectomy and liver biopsy  IV albumin infusion x3  Antimicrobials:  Preop Ancef  DVT prophylaxis: Subcu heparin   Objective: Vitals:   05/16/20 2007 05/07/2020 0716  BP: (!) 164/70 137/71  Pulse: 80  90  Resp: 20 20  Temp: (!) 97.3 F (36.3 C)   SpO2: 90%     Intake/Output Summary (Last 24 hours) at 04/28/2020  1121 Last data filed at 05/16/2020 2007 Gross per 24 hour  Intake --  Output 200 ml  Net -200 ml   Filed Weights   05/15/20 0647 05/16/20 0353 05/22/2020 0317  Weight: 82.2 kg 82.7 kg 80.2 kg   Body mass index is 30.35 kg/m.  Exam:   General: Somnolent, no acute distress  HEENT: Normocephalic and atraumatic, mucous membranes are slightly dry  Cardiovascular: Regular rate and rhythm, S1-S2  Respiratory: Clear to auscultation bilaterally  Abdomen: Soft, nontender, nondistended, positive bowel sounds  Musculoskeletal: No clubbing or cyanosis, trace pitting edema bilaterally  Skin: Other than abdominal incision, no skin breaks, tears or lesions  Neuro: No focal deficits  Psychiatry: Appropriate, no evidence of psychosis   Data Reviewed: CBC: Recent Labs  Lab 05/10/20 1344 05/12/20 1819 05/13/20 0204 05/14/20 0016 05/15/20 0102 05/16/20 0315 05/08/2020 0046  WBC 10.7*   < > 14.6* 15.9* 13.0* 13.5* 19.5*  NEUTROABS 9.4*  --   --   --   --   --   --   HGB 10.6*   < > 11.1* 10.7* 9.6* 9.0* 10.7*  HCT 36.4   < > 37.7 36.1 31.4* 30.0* 35.7*  MCV 92.9   < > 89.3 87.8 87.7 89.3 89.3  PLT 112*   < > 219 246 297 286 330   < > = values in this interval not displayed.   Basic Metabolic Panel: Recent Labs  Lab 05/10/20 1344 05/12/20 1819 05/13/20 0204 05/14/20 0016 05/15/20 0102 05/16/20 0315 05/25/2020 0046  NA 137   < > 133* 129* 130* 132* 136  K 4.3   < > 3.7 4.2 4.2 3.5 3.7  CL 102   < > 103 98 98 99 103  CO2 22   < > 20* 20* 20* 20* 19*  GLUCOSE 178*   < > 109* 117* 94 78 90  BUN 29*   < > 42* 55* 68* 74* 69*  CREATININE 1.99*   < > 1.54* 2.33* 2.62* 2.32* 2.25*  CALCIUM 8.7*   < > 9.3 9.3 8.7* 8.6* 9.2  MG 1.9  --   --   --   --   --   --   PHOS 4.3  --   --   --   --   --   --    < > = values in this interval not displayed.   GFR: Estimated Creatinine Clearance: 22.8 mL/min (A) (by C-G formula based on SCr of 2.25 mg/dL (H)). Liver Function Tests: Recent  Labs  Lab 05/13/20 0749 05/15/20 0102  AST 57* 31  ALT 31 26  ALKPHOS 184* 106  BILITOT 0.8 1.1  PROT 5.4* 5.6*  ALBUMIN 1.9* 2.7*   No results for input(s): LIPASE, AMYLASE in the last 168 hours. Recent Labs  Lab 05/11/20 1116  AMMONIA 39*   Coagulation Profile: No results for input(s): INR, PROTIME in the last 168 hours. Cardiac Enzymes: No results for input(s): CKTOTAL, CKMB, CKMBINDEX, TROPONINI in the last 168 hours. BNP (last 3 results) No results for input(s): PROBNP in the last 8760 hours. HbA1C: No results for input(s): HGBA1C in the last 72 hours. CBG: No results for input(s): GLUCAP in the last 168 hours. Lipid Profile: No results for input(s): CHOL, HDL, LDLCALC, TRIG, CHOLHDL, LDLDIRECT in the last 72 hours. Thyroid  Function Tests: No results for input(s): TSH, T4TOTAL, FREET4, T3FREE, THYROIDAB in the last 72 hours. Anemia Panel: No results for input(s): VITAMINB12, FOLATE, FERRITIN, TIBC, IRON, RETICCTPCT in the last 72 hours. Urine analysis:    Component Value Date/Time   COLORURINE YELLOW 05/11/2020 1445   APPEARANCEUR HAZY (A) 05/11/2020 1445   LABSPEC 1.015 05/11/2020 1445   PHURINE 5.0 05/11/2020 1445   GLUCOSEU NEGATIVE 05/11/2020 1445   HGBUR NEGATIVE 05/11/2020 [place] 05/11/2020 1445   KETONESUR NEGATIVE 05/11/2020 1445   PROTEINUR NEGATIVE 05/11/2020 1445   NITRITE NEGATIVE 05/11/2020 1445   LEUKOCYTESUR NEGATIVE 05/11/2020 1445   Sepsis Labs: @LABRCNTIP (procalcitonin:4,lacticidven:4)  ) Recent Results (from the past 240 hour(s))  Culture, blood (Routine X 2) w Reflex to ID Panel     Status: None   Collection Time: 05/09/20  3:18 PM   Specimen: BLOOD RIGHT HAND  Result Value Ref Range Status   Specimen Description BLOOD RIGHT HAND  Final   Special Requests   Final    BOTTLES DRAWN AEROBIC ONLY Blood Culture results may not be optimal due to an inadequate volume of blood received in culture bottles   Culture    Final    NO GROWTH 5 DAYS Performed at [facility], [place] [address] [place], [place] [address]    Report Status 05/14/2020 FINAL  Final  Culture, blood (Routine X 2) w Reflex to ID Panel     Status: None   Collection Time: 05/09/20  3:24 PM   Specimen: BLOOD RIGHT HAND  Result Value Ref Range Status   Specimen Description BLOOD RIGHT HAND  Final   Special Requests   Final    BOTTLES DRAWN AEROBIC ONLY Blood Culture results may not be optimal due to an inadequate volume of blood received in culture bottles   Culture   Final    NO GROWTH 5 DAYS Performed at [facility], [place] [address] [place], [place] [address]    Report Status 05/14/2020 FINAL  Final      Studies: No results found.  Scheduled Meds: . (feeding supplement) PROSource Plus  30 mL Oral BID BM  . Chlorhexidine Gluconate Cloth  6 each Topical Daily  . feeding supplement (ENSURE ENLIVE)  237 mL Oral BID BM  . Gerhardt's butt cream   Topical BID  . heparin injection (subcutaneous)  5,000 Units Subcutaneous Q8H  . PARoxetine  40 mg Oral Daily  . polyethylene glycol  17 g Oral Daily    Continuous Infusions: . sodium chloride Stopped (05/13/20 1300)     LOS: 17 days     Annita Brod, MD [facility]   05/22/2020, 11:21 AM

## 2020-05-17 NOTE — Progress Notes (Addendum)
Patient with worsening pain, shortness of breath, rising WBC today. Chest xray this evening with new pneumoperitoneum. She reports pain is better after medication. Endorses diarrhea.   She is alert, mildly confused, ill-appearing but answers questions appropriately, falls asleep with short shallow resp and groaning when not engaged in conversation Sats 88-90% room air, HR 90, RR 18, shallow breathing Abdomen is distended, diffusely tender and tympanitic, not peritonitic Skin is cool   CXR reviewed- large amount of pneumoperitoneum  I recommend OR this evening for exploration, likely colostomy as this likely represents anastomotic leak. I had a detailed discussion with her sisters by phone, and discussed that without surgery, sepsis and death are expected. Comfort care is an option and we discussed that in detail. With surgery, there is still a high risk of perioperative mortality and very high risk of complications including pneumonia, acute cardiac event or arrhythmia, stroke, DVT/PE, pain, ileus, wound healing problems/ dehiscence, hernia, and high risk of bowel injury given she is post-op day 11 from open colon resection. Her MELD is 17 and she is at least a Childs B, possibly C at this point given encephalopathy/elevated ammonia level and I advised them that in this setting there is at least a 50% perioperative mortality risk. They discussed among themselves and their questions were answered to their satisfaction. They would like to proceed with surgery in order to give her a chance of survival.

## 2020-05-17 NOTE — Progress Notes (Signed)
Notified bedside nurse of need to draw repeat lactic acid. 

## 2020-05-17 NOTE — Progress Notes (Signed)
Spoke with daughter [name] who verified consent for surgery. Patient is aware she is going to surgery. Patient taken over in bed with RN. Patient with no complaints at the current time.

## 2020-05-17 NOTE — Evaluation (Signed)
Occupational Therapy Evaluation Patient Details Name: Regina Richards MRN: [id number] DOB: [birth year] Today's Date: 05/16/2020    History of Present Illness Pt is 74 yo female with PMH including anxiety, HTN, and kidney stones.  Pt presented with [facility] for 3-4 weeks.  She was found to have a hgb of 2.9 in the ED.  Pt admitted with symptomatic anemia and pancytopenia.  Pt had  colonscopy which showed partially obstructing sigmoid colon mass that was biopsied.  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 Today Pt presents with decreased cognition, balance, strength, independence in transfers and ADL. Pt currently mod A +2 for transfers, max A for peri care, mod A for grooming and self-feeding (see ADL section) and will require SNF post-acute to maximize safety and independence in ADL and functional transfers. Pt is very sweet and pleasant, requires multimodal cues for attention to task.     Follow Up Recommendations  SNF;Supervision/Assistance - 24 hour    Equipment Recommendations  Other (comment);3 in 1 bedside commode (defer to SNF)    Recommendations for Other Services       Precautions / Restrictions Precautions Precautions: Fall Restrictions Weight Bearing Restrictions: No      Mobility Bed Mobility               General bed mobility comments: Pt OOB in recliner at beginning and end of session  Transfers Overall transfer level: Needs assistance Equipment used: Rolling walker (2 wheeled) Transfers: Sit to/from Stand Sit to Stand: Mod assist;+2 physical assistance         General transfer comment: cues for hand placement, assist to power up, increased time    Balance Overall balance assessment: Needs assistance Sitting-balance support: No upper extremity supported;Feet supported Sitting  balance-Leahy Scale: Poor Sitting balance - Comments: Pt with posterior lean needing UE support for balance or min assist to sit EOB.  Postural control: Posterior lean Standing balance support: During functional activity;Bilateral upper extremity supported Standing balance-Leahy Scale: Poor Standing balance comment: Needed UE support on RW and external support for standing and for amb                           ADL either performed or assessed with clinical judgement   ADL Overall ADL's : Needs assistance/impaired Eating/Feeding: Moderate assistance;Sitting;Cueing for sequencing;Cueing for compensatory techinques Eating/Feeding Details (indicate cue type and reason): Pt required assist to load her spoon (attempting to feed herself with knife), unable to open containers, required cues to stay awake for feeding, ultimately required pre-loaded utensil for self-feeding Grooming: Wash/dry face;Wash/dry hands;Sitting;Moderate assistance;Brushing hair Grooming Details (indicate cue type and reason): cues for task completion, OT spent time with detangler brushing knots out of Pt's hair. Entire hair brushed smooth Upper Body Bathing: Moderate assistance   Lower Body Bathing: Maximal assistance   Upper Body Dressing : Moderate assistance Upper Body Dressing Details (indicate cue type and reason): increased time Lower Body Dressing: Maximal assistance;+2 for safety/equipment   Toilet Transfer: Moderate assistance;+2 for safety/equipment;+2 for physical assistance;Cueing for safety;Cueing for sequencing;BSC   Toileting- Clothing Manipulation and Hygiene: Maximal assistance;+2 for physical assistance;+2 for safety/equipment;Sit to/from stand Toileting - Clothing Manipulation Details (indicate cue type and reason): Pt require assist for peri care  General ADL Comments: Pt presents with decreased cognition, generalized weakness, and decreased activity tolerance     Vision          Perception     Praxis      Pertinent Vitals/Pain Pain Assessment: Faces Faces Pain Scale: Hurts a little bit Pain Location: abdomen Pain Descriptors / Indicators: Discomfort Pain Intervention(s): Limited activity within patient's tolerance     Hand Dominance Right   Extremity/Trunk Assessment Upper Extremity Assessment Upper Extremity Assessment: Generalized weakness   Lower Extremity Assessment Lower Extremity Assessment: Generalized weakness   Cervical / Trunk Assessment Cervical / Trunk Assessment: Kyphotic   Communication Communication Communication: Other (comment) (increased processing time)   Cognition Arousal/Alertness: Awake/alert Behavior During Therapy: WFL for tasks assessed/performed Overall Cognitive Status: Impaired/Different from baseline Area of Impairment: Problem solving;Memory;Following commands;Awareness                     Memory: Decreased short-term memory Following Commands: Follows one step commands consistently;Follows one step commands with increased time   Awareness: Emergent Problem Solving: Slow processing;Difficulty sequencing;Decreased initiation;Requires verbal cues General Comments: Pt attempting to eat eggs with knife, unable to problem solve how to get eggs on her utensil, required multimodal cues, unable to answer her cell phone. delayed processing for all aspects    General Comments  Pt was not hooked up to any monitoring when OT entered the room. Could not get an O2 reading on finger with pulse-oximeter - but no SOB noticed and Pt presented clinically [facility]    Exercises     Shoulder Instructions      Home Living Family/patient expects to be discharged to:: Private residence Living Arrangements: Alone Available Help at Discharge: Family;Available PRN/intermittently (Pt has 5 sisters) Type of Home: Other(Comment) (Condo) Home Access: Level entry     Home Layout: One level     Bathroom Shower/Tub: Animal nutritionist: Standard     Home Equipment: Environmental consultant - 4 wheels;Grab bars - tub/shower          Prior Functioning/Environment Level of Independence: Independent        Comments: Able to do ADLs, IADLs, community ambulation, driving, and 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        OT Problem List: Decreased strength;Decreased activity tolerance;Impaired balance (sitting and/or standing);Decreased cognition;Decreased safety awareness;Decreased knowledge of use of DME or AE      OT Treatment/Interventions: Self-care/ADL training;DME and/or AE instruction;Therapeutic activities;Cognitive remediation/compensation;Patient/family education;Balance training    OT Goals(Current goals can be found in the care plan section) Acute Rehab OT Goals Patient Stated Goal: get better OT Goal Formulation: With patient Time For Goal Achievement: 05/31/20 Potential to Achieve Goals: Good ADL Goals Pt Will Perform Eating: with supervision;sitting Pt Will Perform Grooming: with min assist;sitting Pt Will Transfer to Toilet: stand pivot transfer;with min assist Pt Will Perform Toileting - Clothing Manipulation and hygiene: with mod assist;sit to/from stand  OT Frequency: Min 2X/week   Barriers to D/C: Decreased caregiver support (Pt lives alone)          Co-evaluation              AM-PAC OT "6 Clicks" Daily Activity     Outcome Measure Help from another person eating meals?: A Lot Help from another person taking care of personal grooming?: A Lot Help from another person toileting, which includes using toliet, bedpan, or urinal?: A Lot Help from another person bathing (including washing,  rinsing, drying)?: A Lot Help from another person to put on and taking off regular upper body clothing?: A Lot Help from another person to put on and taking off regular lower body clothing?: A Lot 6 Click Score: 12   End of Session Equipment Utilized During Treatment: Gait  belt;Rolling walker Nurse Communication: Mobility status;Other (comment) (ate some of her eggs)  Activity Tolerance: Patient limited by fatigue Patient left: in chair;with call bell/phone within reach;with chair alarm set (talking to sister on cell phone)  OT Visit Diagnosis: Unsteadiness on feet (R26.81);Other abnormalities of gait and mobility (R26.89);Muscle weakness (generalized) (M62.81);Other symptoms and signs involving cognitive function                Time: 6468-0321 OT Time Calculation (min): 32 min Charges:  OT General Charges $OT Visit: 1 Visit OT Evaluation $OT Eval Moderate Complexity: 1 Mod OT Treatments $Self Care/Home Management : 8-22 mins  Jesse Sans OTR/L Acute Rehabilitation Services Pager: [phone] Office: [place] 05/14/2020, 10:33 AM

## 2020-05-17 NOTE — NC FL2 (Signed)
[place] LEVEL OF CARE SCREENING TOOL     IDENTIFICATION  Patient Name: Regina Richards VCBS Birthdate: Jun 29, 1946 Sex: female Admission Date (Current Location): 05/26/2020  [facility] and [place] Number:  Herbalist and Address:  [facility], [place] [address], [place], [place] [address]      Provider Number: [id number]  Attending Physician Name and Address:  Annita Brod, MD  Relative Name and Phone Number:  Tiajuana Amass [phone]    Current Level of Care: Hospital Recommended Level of Care: [place] Prior Approval Number:    Date Approved/Denied:   PASRR Number: [id number]  Discharge Plan: SNF    Current Diagnoses: Patient Active Problem List   Diagnosis Date Noted  . Adenocarcinoma of colon, Duke's A ([name])   . Depression   . Benign neoplasm of descending colon   . Liver cirrhosis secondary to NASH ([name])   . Obesity (BMI 30-39.9)   . Thrombocytopenia ([name])   . Iron deficiency anemia   . Symptomatic anemia 04/29/2020  . Pancytopenia ([name]) 05/04/2020  . History of kidney stones   . GAD (generalized anxiety disorder) 10/01/2018  . Elevated blood pressure reading 08/09/2016  . Ureteral stone with hydronephrosis 08/09/2016  . Acute kidney injury ([name])     Orientation RESPIRATION BLADDER Height & Weight     Self, Time, Situation, Place  Normal Incontinent, External catheter Weight: 176 lb 12.9 oz (80.2 kg) Height:  5\' 4"  (162.6 cm)  BEHAVIORAL SYMPTOMS/MOOD NEUROLOGICAL BOWEL NUTRITION STATUS      Incontinent (Rectal tube) Diet (Please see DC Summary)  AMBULATORY STATUS COMMUNICATION OF NEEDS Skin   Extensive Assist Verbally Surgical wounds (Closed incision on abdomen)                       Personal Care Assistance Level of Assistance  Bathing, Feeding, Dressing Bathing Assistance: Maximum assistance Feeding assistance: Independent Dressing Assistance: Limited assistance     Functional  Limitations Info  Sight, Hearing, Speech Sight Info: Adequate Hearing Info: Adequate Speech Info: Adequate    SPECIAL CARE FACTORS FREQUENCY  PT (By licensed PT), OT (By licensed OT)     PT Frequency: 5x/week OT Frequency: 5x/week            Contractures Contractures Info: Not present    Additional Factors Info  Code Status, Allergies, Psychotropic Code Status Info: Full Allergies Info: Sulfa Antibiotics Psychotropic Info: Paxil         Current Medications (05/22/2020):  This is the current hospital active medication list Current Facility-Administered Medications  Medication Dose Route Frequency Provider Last Rate Last Admin  . (feeding supplement) PROSource Plus liquid 30 mL  30 mL Oral BID BM Hosie Poisson, MD   30 mL at 05/24/2020 1420  . 0.9 %  sodium chloride infusion   Intra-arterial PRN Effie Berkshire, MD   Stopped at 05/13/20 1300  . 0.9 %  sodium chloride infusion   Intravenous Continuous Annita Brod, MD      . acetaminophen (TYLENOL) tablet 650 mg  650 mg Oral Q6H PRN Hosie Poisson, MD   650 mg at 05/09/20 0028   Or  . acetaminophen (TYLENOL) suppository 650 mg  650 mg Rectal Q6H PRN Hosie Poisson, MD      . ceFEPIme (MAXIPIME) 2 g in sodium chloride 0.9 % 100 mL IVPB  2 g Intravenous Q24H Annita Brod, MD      . Chlorhexidine Gluconate  Cloth 2 % PADS 6 each  6 each Topical Daily Hosie Poisson, MD   6 each at 05/12/20 0912  . feeding supplement (ENSURE ENLIVE) (ENSURE ENLIVE) liquid 237 mL  237 mL Oral BID BM Kinsinger, Arta Bruce, MD   237 mL at 05/26/2020 1420  . Gerhardt's butt cream   Topical BID Lavina Hamman, MD   Given at 05/14/2020 1004  . heparin injection 5,000 Units  5,000 Units Subcutaneous Q8H Hosie Poisson, MD   5,000 Units at 05/14/2020 1419  . lactated ringers bolus 1,000 mL  1,000 mL Intravenous Once Annita Brod, MD 2,000 mL/hr at 05/15/2020 1559 1,000 mL at 05/26/2020 1559   And  . lactated ringers bolus 1,000 mL  1,000 mL Intravenous  Once Annita Brod, MD       And  . lactated ringers bolus 500 mL  500 mL Intravenous Once Annita Brod, MD      . ondansetron [facility]) tablet 4 mg  4 mg Oral Q6H PRN Hosie Poisson, MD       Or  . ondansetron (ZOFRAN) injection 4 mg  4 mg Intravenous Q6H PRN Hosie Poisson, MD   4 mg at 05/10/20 0142  . oxyCODONE (Oxy IR/ROXICODONE) immediate release tablet 5 mg  5 mg Oral Q4H PRN Barkley Boards R, PA-C   5 mg at 05/16/2020 1613  . PARoxetine (PAXIL) tablet 40 mg  40 mg Oral Daily Lavina Hamman, MD   40 mg at 05/06/2020 1001  . phenol (CHLORASEPTIC) mouth spray 1 spray  1 spray Mouth/Throat PRN Marcell Anger, MD   1 spray at 05/15/20 1451  . polyethylene glycol (MIRALAX / GLYCOLAX) packet 17 g  17 g Oral Daily Hosie Poisson, MD   17 g at 05/11/20 0909  . vancomycin (VANCOREADY) IVPB 1500 mg/300 mL  1,500 mg Intravenous Once Annita Brod, MD      . Derrill Memo ON 05/18/2020] vancomycin (VANCOREADY) IVPB 750 mg/150 mL  750 mg Intravenous Q24H Annita Brod, MD         Discharge Medications: Please see discharge summary for a list of discharge medications.  Relevant Imaging Results:  Relevant Lab Results:   Additional Information SSN: [id number]. Has not received COVID vaccine.  Benard Halsted, LCSW

## 2020-05-17 NOTE — Progress Notes (Signed)
Asked bedside RN to check with lab and see why blood cultures have not been documented. Also made aware of repeat lactate @ 1739

## 2020-05-17 NOTE — Transfer of Care (Signed)
Immediate Anesthesia Transfer of Care Note  Patient: Regina Richards  Procedure(s) Performed: EXPLORATORY LAPAROTOMY, ABDOMINAL Cory Roughen PLACEMENT (N/A Abdomen) LYSIS OF ADHESION (Abdomen) COLOSTOMY (Left Abdomen)  Patient Location: SICU  Anesthesia Type:General  Level of Consciousness: Patient remains intubated per anesthesia plan  Airway & Oxygen Therapy: Patient placed on Ventilator (see vital sign flow sheet for setting)  Post-op Assessment: Report given to RN and Post -op Vital signs reviewed and stable  Post vital signs: Reviewed and stable  Last Vitals:  Vitals Value Taken Time  BP 111/45 05/21/2020 2216  Temp    Pulse 73 05/05/2020 2222  Resp 15 04/30/2020 2222  SpO2 100 % 05/20/2020 2222  Vitals shown include unvalidated device data.  Last Pain:  Vitals:   05/25/2020 1800  TempSrc:   PainSc: 6       Patients Stated Pain Goal: 2 (25/74/93 5521)  Complications: No complications documented.

## 2020-05-17 NOTE — Anesthesia Preprocedure Evaluation (Addendum)
Anesthesia Evaluation  Patient identified by MRN, date of birth, ID band Patient confused    Reviewed: Allergy & Precautions, NPO status , Patient's Chart, lab work & pertinent test results  History of Anesthesia Complications (+) PONV and history of anesthetic complications  Airway Mallampati: III  TM Distance: >3 FB Neck ROM: Full    Dental no notable dental hx.    Pulmonary neg pulmonary ROS,    Pulmonary exam normal breath sounds clear to auscultation       Cardiovascular hypertension, Normal cardiovascular exam Rhythm:Regular Rate:Normal  ECG: SR, rate 89  ECHO: 1. Left ventricular ejection fraction, by estimation, is 60%. The left ventricle has normal function. The left ventricle has no regional wall motion abnormalities. There is mild left ventricular hypertrophy. Left ventricular diastolic parameters were normal. 2. Right ventricular systolic function is normal. The right ventricular size is normal. 3. Left atrial size was mildly dilated. 4. The mitral valve is grossly normal. Mild mitral valve regurgitation. No evidence of mitral stenosis. 5. Tricuspid valve regurgitation is mild to moderate. 6. The aortic valve is grossly normal. Aortic valve regurgitation is mild. No aortic stenosis is present.   Neuro/Psych PSYCHIATRIC DISORDERS Anxiety Depression    GI/Hepatic negative GI ROS, Neg liver ROS,   Endo/Other  negative endocrine ROS  Renal/GU Renal disease     Musculoskeletal negative musculoskeletal ROS (+)   Abdominal (+) + obese,   Peds  Hematology  (+) anemia ,   Anesthesia Other Findings FREE AIR  Reproductive/Obstetrics                            Anesthesia Physical Anesthesia Plan  ASA: III and emergent  Anesthesia Plan: General   Post-op Pain Management:    Induction: Intravenous and Rapid sequence  PONV Risk Score and Plan: 4 or greater and Ondansetron,  Dexamethasone, Propofol infusion and Treatment may vary due to age or medical condition  Airway Management Planned: Oral ETT  Additional Equipment:   Intra-op Plan:   Post-operative Plan: Extubation in OR and Possible Post-op intubation/ventilation  Informed Consent: I have reviewed the patients History and Physical, chart, labs and discussed the procedure including the risks, benefits and alternatives for the proposed anesthesia with the patient or authorized representative who has indicated his/her understanding and acceptance.     Consent reviewed with POA and Dental advisory given  Plan Discussed with: CRNA  Anesthesia Plan Comments: (Anesthetic plan discussed with sister via telephone)      Anesthesia Quick Evaluation

## 2020-05-17 NOTE — Progress Notes (Addendum)
Pharmacy Antibiotic Note  Regina Richards is a 74 y.o. female admitted on 05/24/2020 with SOB and syncope, symptomatic iron deficiency anemia.  Pt also with AKI, likely multifactorial; renal function has been ~stable over past 3 days. Today, pt with elevated WBC, lactic acid 2.5, procalcitonin 3.91. Pharmacy has been consulted for cefepime and vancomycin dosing for HCAP.  WBC 19.5, afebrile, SCr 2.25, CrCl 22. 8 ml/min   2200 update: pt with worsening pain, SOB, CXR: pneumoperitoneum; pt to OR this evening for exploratory laparotomy, lysis of inflammatory adhesions, abdominal washout, creation of transverse loop colostomy and placement of drain; spoke with Dr. Kae Heller - metronidazole IV added to antibiotic regimen  Plan: Cefepime 2 gm IV Q 24 hrs Vancomycin 1500 mg IV X 1, followed by vancomycin 750 mg IV Q 24 hrs (goal vancomycin trough level: 15-20 mg/L) Metronidazole 500 mg IV Q 8 hrs Monitor WBC, temp, clinical improvement, cultures, renal function, vancomycin levels  Height: 5\' 4"  (162.6 cm) Weight: 80.2 kg (176 lb 12.9 oz) IBW/kg (Calculated) : 54.7  Temp (24hrs), Avg:97.3 F (36.3 C), Min:97.3 F (36.3 C), Max:97.3 F (36.3 C)  Recent Labs  Lab 05/13/20 0204 05/14/20 0016 05/15/20 0102 05/16/20 0315 05/23/2020 0046 05/08/2020 1221  WBC 14.6* 15.9* 13.0* 13.5* 19.5*  --   CREATININE 1.54* 2.33* 2.62* 2.32* 2.25*  --   LATICACIDVEN  --   --   --   --   --  2.5*    Estimated Creatinine Clearance: 22.8 mL/min (A) (by C-G formula based on SCr of 2.25 mg/dL (H)).    Allergies  Allergen Reactions  . Sulfa Antibiotics Rash    Rash on legs    Antimicrobials this admission: 9/8 amoxicillin >> 9/13 9/9 cefotetan X 1 9/20 cefepime >> 9/20 vancomycin>> 9/20 metronidazole >>  Microbiology results: 9/12 BCx X 2: NG/final 9/20 BCx X 2: collected, results pending 9/5 UCx: Enterococcus faecalis, pan sensitive (treated with amoxillin) 9/4 MRSA PCR: negative 9/7 Hepatitis A, B, C  panel: negative 9/3 COVID: negative  Thank you for allowing pharmacy to be a part of this patient's care.  Gillermina Hu, PharmD, BCPS, [facility] Clinical Pharmacist 05/07/2020 3:50 PM

## 2020-05-17 NOTE — Progress Notes (Signed)
Patient ID: Regina Richards, female   DOB: [birth year], 74 y.o.   MRN: [id number] S: No complaints this morning. O:BP 137/71 (BP Location: Right Arm)   Pulse 90   Temp (!) 97.3 F (36.3 C) (Oral)   Resp 20   Ht 5\' 4"  (1.626 m)   Wt 80.2 kg   LMP  (LMP Unknown)   SpO2 90%   BMI 30.35 kg/m   Intake/Output Summary (Last 24 hours) at 05/23/2020 1107 Last data filed at 05/16/2020 2007 Gross per 24 hour  Intake --  Output 200 ml  Net -200 ml   Intake/Output: I/O last 3 completed shifts: In: -  Out: 200 [Urine:200]  Intake/Output this shift:  No intake/output data recorded. Weight change: -2.5 kg Gen: frail, elderly WF sitting in chair sleeping, slowed mentation. NAD CVS:no rub Resp:cta Abd: distended, binder in place, midline staples Ext: 2+ pitting edema BLE  Recent Labs  Lab 05/10/20 1344 05/12/20 1819 05/13/20 0204 05/13/20 0749 05/14/20 0016 05/15/20 0102 05/16/20 0315 05/19/2020 0046  NA 137 128* 133*  --  129* 130* 132* 136  K 4.3 3.6 3.7  --  4.2 4.2 3.5 3.7  CL 102 97* 103  --  98 98 99 103  CO2 22 18* 20*  --  20* 20* 20* 19*  GLUCOSE 178* 123* 109*  --  117* 94 78 90  BUN 29* 43* 42*  --  55* 68* 74* 69*  CREATININE 1.99* 1.57* 1.54*  --  2.33* 2.62* 2.32* 2.25*  ALBUMIN  --   --   --  1.9*  --  2.7*  --   --   CALCIUM 8.7* 8.7* 9.3  --  9.3 8.7* 8.6* 9.2  PHOS 4.3  --   --   --   --   --   --   --   AST  --   --   --  57*  --  31  --   --   ALT  --   --   --  31  --  26  --   --    Liver Function Tests: Recent Labs  Lab 05/13/20 0749 05/15/20 0102  AST 57* 31  ALT 31 26  ALKPHOS 184* 106  BILITOT 0.8 1.1  PROT 5.4* 5.6*  ALBUMIN 1.9* 2.7*   No results for input(s): LIPASE, AMYLASE in the last 168 hours. Recent Labs  Lab 05/11/20 1116  AMMONIA 39*   CBC: Recent Labs  Lab 05/10/20 1344 05/12/20 1819 05/13/20 0204 05/13/20 0204 05/14/20 0016 05/14/20 0016 05/15/20 0102 05/16/20 0315 04/30/2020 0046  WBC 10.7*   < > 14.6*   < > 15.9*   < >  13.0* 13.5* 19.5*  NEUTROABS 9.4*  --   --   --   --   --   --   --   --   HGB 10.6*   < > 11.1*   < > 10.7*   < > 9.6* 9.0* 10.7*  HCT 36.4   < > 37.7   < > 36.1   < > 31.4* 30.0* 35.7*  MCV 92.9   < > 89.3  --  87.8  --  87.7 89.3 89.3  PLT 112*   < > 219   < > 246   < > 297 286 330   < > = values in this interval not displayed.   Cardiac Enzymes: No results for input(s): CKTOTAL, CKMB, CKMBINDEX, TROPONINI in the last 168 hours.  CBG: No results for input(s): GLUCAP in the last 168 hours.  Iron Studies: No results for input(s): IRON, TIBC, TRANSFERRIN, FERRITIN in the last 72 hours. Studies/Results: No results found. . (feeding supplement) PROSource Plus  30 mL Oral BID BM  . Chlorhexidine Gluconate Cloth  6 each Topical Daily  . feeding supplement (ENSURE ENLIVE)  237 mL Oral BID BM  . Gerhardt's butt cream   Topical BID  . heparin injection (subcutaneous)  5,000 Units Subcutaneous Q8H  . PARoxetine  40 mg Oral Daily  . polyethylene glycol  17 g Oral Daily    BMET    Component Value Date/Time   NA 136 05/20/2020 0046   K 3.7 05/21/2020 0046   CL 103 05/24/2020 0046   CO2 19 (L) 05/22/2020 0046   GLUCOSE 90 05/12/2020 0046   BUN 69 (H) 05/08/2020 0046   CREATININE 2.25 (H) 05/11/2020 0046   CALCIUM 9.2 05/26/2020 0046   GFRNONAA 21 (L) 05/07/2020 0046   GFRAA 24 (L) 04/28/2020 0046   CBC    Component Value Date/Time   WBC 19.5 (H) 05/10/2020 0046   RBC 4.00 05/23/2020 0046   HGB 10.7 (L) 05/14/2020 0046   HCT 35.7 (L) 05/01/2020 0046   PLT 330 05/07/2020 0046   MCV 89.3 05/10/2020 0046   MCH 26.8 05/19/2020 0046   MCHC 30.0 05/14/2020 0046   RDW 27.0 (H) 05/16/2020 0046   LYMPHSABS 0.3 (L) 05/10/2020 1344   MONOABS 0.9 05/10/2020 1344   EOSABS 0.0 05/10/2020 1344   BASOSABS 0.0 05/10/2020 1344     Assessment/Plan:  1. AKI- likely multifactorial related to transient hypovolemia/hypotensive following bowel resection as well as 3rd spacing due to  hypoalbuminemia.  Low Urine sodium consistent with either pre-renal or hepatorenal syndrome.  Responded to IV albumin with increase UOP and improvement of BUN/Cr overnight.  Renal US with left renal atrophy.  Avoid npehrotoxins and hypotension.  No indications for RRT at this time. 2. Hyponatremia- due to 3rd spacing and edema.  Improving.  3. Colon cancer- locally invasive s/p resection.  Nodes negative. 4. 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  New diagnosis.will need further gi evaluation. 5. ABLA following surgery. 6. Thrombocytopenia - improved 7. Hypoalbuminemia- likely due to cirrhosis  Donetta Potts, MD [facility] [phone]

## 2020-05-17 NOTE — Progress Notes (Signed)
Notified bedside nurse of need to draw blood cultures.  

## 2020-05-17 NOTE — Anesthesia Procedure Notes (Signed)
Procedure Name: Intubation Date/Time: 05/01/2020 7:29 PM Performed by: Valetta Fuller, CRNA Pre-anesthesia Checklist: Patient identified, Emergency Drugs available, Suction available and Patient being monitored Patient Re-evaluated:Patient Re-evaluated prior to induction Oxygen Delivery Method: Circle system utilized Preoxygenation: Pre-oxygenation with 100% oxygen Induction Type: IV induction and Rapid sequence Laryngoscope Size: Miller and 2 Grade View: Grade I Tube type: Oral Tube size: 8.0 mm Number of attempts: 1 Airway Equipment and Method: Stylet Placement Confirmation: ETT inserted through vocal cords under direct vision,  positive ETCO2 and breath sounds checked- equal and bilateral Secured at: 23 cm Tube secured with: Tape Dental Injury: Teeth and Oropharynx as per pre-operative assessment

## 2020-05-17 NOTE — OR Nursing (Signed)
18fr Red Quentin Cornwall remains in/on patient as a bridge holding loop of colon up to stoma site.per Dr. Windle Guard

## 2020-05-17 NOTE — TOC Initial Note (Signed)
Transition of Care [facility]) - Initial/Assessment Note    Patient Details  Name: Regina Richards MRN: [id number] Date of Birth: [birth year]  [facility] CM/SW Contact:    [address], [place] Phone Number: 05/18/2020, 4:18 PM  Clinical Narrative:                 CSW received consult for possible SNF placement at time of discharge. CSW spoke with patient regarding PT recommendation of SNF placement at time of discharge, but patient not able to answer anything other than basic questions. CSW spoke with patient's sister, Bryson Ha [phone]. Patient's sister reported that patient is currently unable to care for herself at her home given patient's current physical needs and fall risk. Patient's sister expressed understanding of PT recommendation and is agreeable to SNF placement at time of discharge. Patient's sister reports preference for [facility] as patient has been there before and was treated well. CSW discussed insurance authorization process and provided Medicare SNF ratings list. Patient has not received the COVID vaccines. Patient's sister expressed being hopeful for rehab and to feel better soon. No further questions reported at this time. CSW to continue to follow and assist with discharge planning needs.  Expected Discharge Plan: Skilled Nursing Facility Barriers to Discharge: Insurance Authorization, Continued Medical Work up   Patient Goals and CMS Choice Patient states their goals for this hospitalization and ongoing recovery are:: Get better soon CMS Medicare.gov Compare Post Acute Care list provided to:: Patient Represenative (must comment) Bryson Ha) Choice offered to / list presented to : Sibling  Expected Discharge Plan and Services Expected Discharge Plan: [place] In-house Referral: Clinical Social Work     Living arrangements for the past 2 months: Single Family Home                                      Prior Living  Arrangements/Services Living arrangements for the past 2 months: Single Family Home Lives with:: Self Patient language and need for interpreter reviewed:: Yes Do you feel safe going back to the place where you live?: No   No one to help at home  Need for Family Participation in Patient Care: Yes (Comment) Care giver support system in place?: Yes (comment)   Criminal Activity/Legal Involvement Pertinent to Current Situation/Hospitalization: No - Comment as needed  Activities of Daily Living Home Assistive Devices/Equipment: None ADL Screening (condition at time of admission) Patient's cognitive ability adequate to safely complete daily activities?: Yes Is the patient deaf or have difficulty hearing?: No Does the patient have difficulty seeing, even when wearing glasses/contacts?: No Does the patient have difficulty concentrating, remembering, or making decisions?: No Patient able to express need for assistance with ADLs?: Yes Does the patient have difficulty dressing or bathing?: No Independently performs ADLs?: Yes (appropriate for developmental age) Does the patient have difficulty walking or climbing stairs?: No Weakness of Legs: None Weakness of Arms/Hands: None  Permission Sought/Granted Permission sought to share information with : Case Manager, Family Supports, Customer service manager Permission granted to share information with : No (Fluctuating Orientation)  Share Information with NAME: Yetta Glassman  Permission granted to share info w AGENCY: SNFs  Permission granted to share info w Relationship: Sister  Permission granted to share info w Contact Information: [phone]  Emotional Assessment Appearance:: Appears stated age Attitude/Demeanor/Rapport: Sedated Affect (typically observed): Flat, Quiet Orientation: : Oriented to Self, Oriented to Place,  Oriented to  Time, Oriented to Situation Alcohol / Substance Use: Not Applicable Psych Involvement: No  (comment)  Admission diagnosis:  Pancytopenia ([place]) [D61.818] Symptomatic anemia [D64.9] Patient Active Problem List   Diagnosis Date Noted  . Adenocarcinoma of colon, Duke's A ([place])   . Depression   . Benign neoplasm of descending colon   . Liver cirrhosis secondary to NASH ([place])   . Obesity (BMI 30-39.9)   . Thrombocytopenia ([place])   . Iron deficiency anemia   . Symptomatic anemia 05/02/2020  . Pancytopenia ([place]) 04/29/2020  . History of kidney stones   . GAD (generalized anxiety disorder) 10/01/2018  . Elevated blood pressure reading 08/09/2016  . Ureteral stone with hydronephrosis 08/09/2016  . Acute kidney injury ([place])    PCP:  Patient, No Pcp Per Pharmacy:   [facility], [address] [place] [address] Phone: [phone] Fax: [place] [facility] [place], [address] AT [address] [place] [address] Phone: [phone] Fax: [phone]     Social Determinants of Health (SDOH) Interventions    Readmission Risk Interventions No flowsheet data found.

## 2020-05-17 NOTE — Progress Notes (Signed)
Nutrition Follow-up  DOCUMENTATION CODES:   Not applicable  INTERVENTION:   - Continue Ensure Enlive po BID, each supplement provides 350 kcal and 20 grams of protein  - Continue ProSource Plus 30 ml po BID, each supplement provides 100 kcal and 15 grams of protein  - Encourage adequate PO intake  NUTRITION DIAGNOSIS:   Inadequate oral intake related to altered GI function as evidenced by other (clear liquid diet order).  Progressing, pt now on soft diet  GOAL:   Patient will meet greater than or equal to 90% of their needs  Progressing  MONITOR:   PO intake, Supplement acceptance, Diet advancement, Weight trends, Labs, Skin  REASON FOR ASSESSMENT:   NPO/Clear Liquid Diet    ASSESSMENT:   74 year old female who presented on 9/03 with syncopal episode and SOB. PMH of anxiety, HTN, nephrolithiasis. Pt admitted with severe iron deficiency anemia.  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 Liver biopsy showing hepatic steatosis and cirrhosis.  Admit weight: 78.9 kg Current weight: 80.2 kg  Weight was trending up (highest was 83 kg), now trending back down. Lowest weight was 73.2 kg. Suspect this is close to pt's dry weight.  Spoke with pt at bedside who is asking if she needs to go home today. Pt appears somewhat confused. Pt with untouched lunch meal tray present. RD offered to assist pt in getting set up for lunch meal but pt declined. Noted two full Ensure Enlive supplements at bedside. Pt reports that she is drinking 3 Ensure  Enlive supplements daily but question accuracy given MAR documentation shows pt frequently refusing supplements.  RD discussed with pt the importance of adequate PO intake in maintaining lean muscle mass and preventing dry weight loss. Discussed the need for pt to eat well to be able to heal and be ready for discharge. Pt expresses understanding and states that she will try to eat better.  Meal Completion: 50-100%  Medications reviewed and include: ProSource Plus BID, Ensure Enlive BID, miralax, IV albumin  Labs reviewed: BUN 69, creatinine 2.25  Diet Order:   Diet Order            DIET SOFT Room service appropriate? Yes; Fluid consistency: Thin  Diet effective now                 EDUCATION NEEDS:   No education needs have been identified at this time  Skin:  Skin Assessment: Skin Integrity Issues: Incisions: closed abdomen  Last BM:  05/23/2020 medium type 6  Height:   Ht Readings from Last 1 Encounters:  05/02/2020 5\' 4"  (1.626 m)    Weight:   Wt Readings from Last 1 Encounters:  05/25/2020 80.2 kg    Ideal Body Weight:  54.5 kg  BMI:  Body mass index is 30.35 kg/m.  Estimated Nutritional Needs:   Kcal:  1800-2000  Protein:  85-100 grams  Fluid:  1.8-2.0 L    Gaynell Face, MS, RD, LDN Inpatient Clinical Dietitian Please see AMiON for contact information.

## 2020-05-18 ENCOUNTER — Inpatient Hospital Stay (HOSPITAL_COMMUNITY): Payer: Medicare Other

## 2020-05-18 ENCOUNTER — Inpatient Hospital Stay: Payer: Self-pay

## 2020-05-18 ENCOUNTER — Encounter (HOSPITAL_COMMUNITY): Payer: Self-pay | Admitting: Surgery

## 2020-05-18 DIAGNOSIS — R140 Abdominal distension (gaseous): Secondary | ICD-10-CM

## 2020-05-18 DIAGNOSIS — D124 Benign neoplasm of descending colon: Secondary | ICD-10-CM

## 2020-05-18 LAB — GLUCOSE, CAPILLARY
Glucose-Capillary: 118 mg/dL — ABNORMAL HIGH (ref 70–99)
Glucose-Capillary: 166 mg/dL — ABNORMAL HIGH (ref 70–99)
Glucose-Capillary: 170 mg/dL — ABNORMAL HIGH (ref 70–99)
Glucose-Capillary: 189 mg/dL — ABNORMAL HIGH (ref 70–99)
Glucose-Capillary: 197 mg/dL — ABNORMAL HIGH (ref 70–99)
Glucose-Capillary: 202 mg/dL — ABNORMAL HIGH (ref 70–99)
Glucose-Capillary: 73 mg/dL (ref 70–99)

## 2020-05-18 LAB — URINALYSIS, ROUTINE W REFLEX MICROSCOPIC
Bilirubin Urine: NEGATIVE
Glucose, UA: NEGATIVE mg/dL
Ketones, ur: NEGATIVE mg/dL
Nitrite: NEGATIVE
Protein, ur: NEGATIVE mg/dL
Specific Gravity, Urine: 1.013 (ref 1.005–1.030)
pH: 5 (ref 5.0–8.0)

## 2020-05-18 LAB — RENAL FUNCTION PANEL
Albumin: 3.1 g/dL — ABNORMAL LOW (ref 3.5–5.0)
Albumin: 3.3 g/dL — ABNORMAL LOW (ref 3.5–5.0)
Anion gap: 13 (ref 5–15)
Anion gap: 11 (ref 5–15)
BUN: 72 mg/dL — ABNORMAL HIGH (ref 8–23)
BUN: 70 mg/dL — ABNORMAL HIGH (ref 8–23)
CO2: 17 mmol/L — ABNORMAL LOW (ref 22–32)
CO2: 22 mmol/L (ref 22–32)
Calcium: 8.3 mg/dL — ABNORMAL LOW (ref 8.9–10.3)
Calcium: 8.4 mg/dL — ABNORMAL LOW (ref 8.9–10.3)
Chloride: 107 mmol/L (ref 98–111)
Chloride: 104 mmol/L (ref 98–111)
Creatinine, Ser: 2.8 mg/dL — ABNORMAL HIGH (ref 0.44–1.00)
Creatinine, Ser: 2.75 mg/dL — ABNORMAL HIGH (ref 0.44–1.00)
GFR calc Af Amer: 19 mL/min — ABNORMAL LOW (ref >60)
GFR calc Af Amer: 19 mL/min — ABNORMAL LOW (ref >60)
GFR calc non Af Amer: 16 mL/min — ABNORMAL LOW (ref >60)
GFR calc non Af Amer: 16 mL/min — ABNORMAL LOW (ref >60)
Glucose, Bld: 159 mg/dL — ABNORMAL HIGH (ref 70–99)
Glucose, Bld: 208 mg/dL — ABNORMAL HIGH (ref 70–99)
Phosphorus: 7.1 mg/dL — ABNORMAL HIGH (ref 2.5–4.6)
Phosphorus: 6.3 mg/dL — ABNORMAL HIGH (ref 2.5–4.6)
Potassium: 4.4 mmol/L (ref 3.5–5.1)
Potassium: 3.5 mmol/L (ref 3.5–5.1)
Sodium: 137 mmol/L (ref 135–145)
Sodium: 137 mmol/L (ref 135–145)

## 2020-05-18 LAB — CBC
HCT: 23.8 % — ABNORMAL LOW (ref 36.0–46.0)
Hemoglobin: 6.8 g/dL — CL (ref 12.0–15.0)
MCH: 27 pg (ref 26.0–34.0)
MCHC: 28.6 g/dL — ABNORMAL LOW (ref 30.0–36.0)
MCV: 94.4 fL (ref 80.0–100.0)
Platelets: 219 10*3/uL (ref 150–400)
RBC: 2.52 MIL/uL — ABNORMAL LOW (ref 3.87–5.11)
RDW: 26.5 % — ABNORMAL HIGH (ref 11.5–15.5)
WBC: 16 10*3/uL — ABNORMAL HIGH (ref 4.0–10.5)
nRBC: 0 % (ref 0.0–0.2)

## 2020-05-18 LAB — POCT I-STAT 7, (LYTES, BLD GAS, ICA,H+H)
Acid-base deficit: 4 mmol/L — ABNORMAL HIGH (ref 0.0–2.0)
Bicarbonate: 21.7 mmol/L (ref 20.0–28.0)
Calcium, Ion: 1.15 mmol/L (ref 1.15–1.40)
HCT: 26 % — ABNORMAL LOW (ref 36.0–46.0)
Hemoglobin: 8.8 g/dL — ABNORMAL LOW (ref 12.0–15.0)
O2 Saturation: 97 %
Patient temperature: 97.7
Potassium: 3.9 mmol/L (ref 3.5–5.1)
Sodium: 135 mmol/L (ref 135–145)
TCO2: 23 mmol/L (ref 22–32)
pCO2 arterial: 40.8 mmHg (ref 32.0–48.0)
pH, Arterial: 7.332 — ABNORMAL LOW (ref 7.350–7.450)
pO2, Arterial: 91 mmHg (ref 83.0–108.0)

## 2020-05-18 LAB — MAGNESIUM: Magnesium: 2.3 mg/dL (ref 1.7–2.4)

## 2020-05-18 LAB — HEMOGLOBIN AND HEMATOCRIT, BLOOD
HCT: 27.5 % — ABNORMAL LOW (ref 36.0–46.0)
Hemoglobin: 8.4 g/dL — ABNORMAL LOW (ref 12.0–15.0)

## 2020-05-18 LAB — LACTIC ACID, PLASMA: Lactic Acid, Venous: 2.6 mmol/L (ref 0.5–1.9)

## 2020-05-18 LAB — PREPARE RBC (CROSSMATCH)

## 2020-05-18 MED ORDER — SODIUM CHLORIDE 0.9 % IV BOLUS
1000.0000 mL | Freq: Once | INTRAVENOUS | Status: AC
  Administered 2020-05-18: 1000 mL via INTRAVENOUS

## 2020-05-18 MED ORDER — SODIUM CHLORIDE 0.9 % IV SOLN
250.0000 mL | INTRAVENOUS | Status: DC
  Administered 2020-05-18 (×2): 250 mL via INTRAVENOUS

## 2020-05-18 MED ORDER — ALBUMIN HUMAN 25 % IV SOLN
25.0000 g | Freq: Four times a day (QID) | INTRAVENOUS | Status: DC
  Administered 2020-05-18: 25 g via INTRAVENOUS
  Administered 2020-05-18: 12.5 g via INTRAVENOUS
  Administered 2020-05-18: 25 g via INTRAVENOUS
  Administered 2020-05-18: 12.5 g via INTRAVENOUS
  Administered 2020-05-19 – 2020-05-20 (×6): 25 g via INTRAVENOUS
  Filled 2020-05-18 (×10): qty 100

## 2020-05-18 MED ORDER — NOREPINEPHRINE 4 MG/250ML-% IV SOLN
INTRAVENOUS | Status: AC
  Administered 2020-05-18: 2 ug/min via INTRAVENOUS
  Filled 2020-05-18: qty 250

## 2020-05-18 MED ORDER — NOREPINEPHRINE 4 MG/250ML-% IV SOLN
2.0000 ug/min | INTRAVENOUS | Status: DC
  Administered 2020-05-18: 7 ug/min via INTRAVENOUS
  Administered 2020-05-19: 2 ug/min via INTRAVENOUS
  Filled 2020-05-18 (×2): qty 250

## 2020-05-18 MED ORDER — SODIUM CHLORIDE 0.9% IV SOLUTION: Freq: Once | INTRAVENOUS | Status: DC

## 2020-05-18 NOTE — Progress Notes (Signed)
Patient ID: Regina Richards, female   DOB: [birth year], 74 y.o.   MRN: [id number]  S: Pt had worsening abdominal pain and was unstable hemodynamically.  Found to have new pneumoperitoneum and underwent exploratory lap s/p LOA and transverse colostomy with drain placement 05/18/2020.  No in ICU on pressors. O:BP (!) 166/45   Pulse (!) 58   Temp (!) 97 F (36.1 C) (Axillary)   Resp 16   Ht 5' 4.02" (1.626 m)   Wt 78.5 kg   LMP  (LMP Unknown)   SpO2 97%   BMI 29.69 kg/m   Intake/Output Summary (Last 24 hours) at 05/18/2020 0952 Last data filed at 05/18/2020 0900 Gross per 24 hour  Intake 5445.41 ml  Output 1785 ml  Net 3660.41 ml   Intake/Output: I/O last 3 completed shifts: In: 4840.7 [P.O.:10; I.V.:1456.9; Blood:523; IV Piggyback:2850.8] Out: 1925 [Urine:925; Drains:950; Blood:50]  Intake/Output this shift:  Total I/O In: 604.7 [I.V.:225.2; Blood:315; IV Piggyback:64.5] Out: 60 [Drains:60] Weight change: 0 kg Gen:Intubated and sedated CVS: bradycardic at 58 Resp: mechanical ventilation bilaterally Abd: distended, hypoactive, colostomy and drain in place Ext: 2+ BLE pitting edema  Recent Labs  Lab 05/13/20 0204 05/13/20 0204 05/13/20 0749 05/14/20 0016 05/15/20 0102 05/16/20 0315 05/12/2020 0046 05/23/2020 2249 05/24/2020 2257 05/18/20 0027  NA 133*   < >  --  129* 130* 132* 136 137 137 137  K 3.7   < >  --  4.2 4.2 3.5 3.7 4.5 4.1 4.4  CL 103  --   --  98 98 99 103 106  --  107  CO2 20*  --   --  20* 20* 20* 19* 17*  --  17*  GLUCOSE 109*  --   --  117* 94 78 90 107*  --  159*  BUN 42*  --   --  55* 68* 74* 69* 71*  --  72*  CREATININE 1.54*  --   --  2.33* 2.62* 2.32* 2.25* 2.65*  --  2.80*  ALBUMIN  --   --  1.9*  --  2.7*  --   --   --   --  3.1*  CALCIUM 9.3  --   --  9.3 8.7* 8.6* 9.2 8.3*  --  8.3*  PHOS  --   --   --   --   --   --   --   --   --  7.1*  AST  --   --  57*  --  31  --   --   --   --   --   ALT  --   --  31  --  26  --   --   --   --   --    < > =  values in this interval not displayed.   Liver Function Tests: Recent Labs  Lab 05/13/20 0749 05/15/20 0102 05/18/20 0027  AST 57* 31  --   ALT 31 26  --   ALKPHOS 184* 106  --   BILITOT 0.8 1.1  --   PROT 5.4* 5.6*  --   ALBUMIN 1.9* 2.7* 3.1*   No results for input(s): LIPASE, AMYLASE in the last 168 hours. Recent Labs  Lab 05/11/20 1116 04/29/2020 1221  AMMONIA 39* 40*   CBC: Recent Labs  Lab 05/15/20 0102 05/15/20 0102 05/16/20 0315 05/16/20 0315 05/10/2020 0046 05/11/2020 0046 05/02/2020 2249 05/11/2020 2257 05/18/20 0027  WBC 13.0*   < > 13.5*   < >  19.5*  --  21.7*  --  16.0*  HGB 9.6*   < > 9.0*   < > 10.7*   < > 7.3* 8.2* 6.8*  HCT 31.4*   < > 30.0*   < > 35.7*   < > 25.6* 24.0* 23.8*  MCV 87.7  --  89.3  --  89.3  --  93.4  --  94.4  PLT 297   < > 286   < > 330  --  281  --  219   < > = values in this interval not displayed.   Cardiac Enzymes: No results for input(s): CKTOTAL, CKMB, CKMBINDEX, TROPONINI in the last 168 hours. CBG: Recent Labs  Lab 05/23/2020 2346 05/18/20 0016 05/18/20 0104 05/18/20 0337 05/18/20 0744  GLUCAP 65* 73 118* 166* 189*    Iron Studies: No results for input(s): IRON, TIBC, TRANSFERRIN, FERRITIN in the last 72 hours. Studies/Results: DG CHEST PORT 1 VIEW  Result Date: 05/13/2020 CLINICAL DATA:  Intubation EXAM: PORTABLE CHEST 1 VIEW COMPARISON:  Chest x-ray 05/22/2020 FINDINGS: Interval placement of an endotracheal tube with tip terminating 2 cm above the carina. Suggestion of an enteric tube noted coursing below the diaphragm with side port collimated off view. The heart size and mediastinal contours are unchanged. Low lung volumes with compressive changes bibasilar early. Biapical pleural/pulmonary scarring. No pulmonary edema. Trace left pleural effusion. Possible trace right pleural effusion. No pneumothorax. Suggestion of pneumoperitoneum under the right hemidiaphragm. No acute osseous abnormality. IMPRESSION: 1. Interval  placement endotracheal tube with tip terminating 2 cm above the carina. 2. Suggestion of an enteric tube coursing below diaphragm with side port collimated off view. 3. Low lung volumes with bilateral trace pleural effusions, left greater than right. 4. Suggestion of pneumoperitoneum which is consistent with patient's postsurgical state status post exploratory laparotomy. Electronically Signed   By: Iven Finn M.D.   On: 05/11/2020 23:26   DG Chest Port 1 View  Result Date: 05/18/2020 CLINICAL DATA:  Colon cancer status post recent colectomy, severe abdominal pain, shortness of breath EXAM: PORTABLE CHEST 1 VIEW COMPARISON:  05/09/2020 FINDINGS: Upright frontal view of the chest demonstrates interval development of pneumoperitoneum most pronounced under the right hemidiaphragm. Lung volumes are diminished, with bibasilar atelectasis. Trace left effusion. No pneumothorax. The cardiac silhouette is unremarkable. IMPRESSION: 1. Interval development of pneumoperitoneum, concerning for anastomotic breakdown given recent bowel surgery. 2. Low lung volumes, with bibasilar atelectasis and trace left effusion. Critical Value/emergent results were called by telephone at the time of interpretation on 05/25/2020 at 4:56 pm to provider [facility] , who verbally acknowledged these results. Electronically Signed   By: Randa Ngo M.D.   On: 05/21/2020 16:52   [place] EKG SITE RITE  Result Date: 05/18/2020 If Site Rite image not attached, placement could not be confirmed due to current cardiac rhythm.  . sodium chloride   Intravenous Once  . chlorhexidine gluconate (MEDLINE KIT)  15 mL Mouth Rinse BID  . Chlorhexidine Gluconate Cloth  6 each Topical Daily  . dextrose  12.5 g Intravenous STAT  . Gerhardt's butt cream   Topical BID  . heparin injection (subcutaneous)  5,000 Units Subcutaneous Q8H  . mouth rinse  15 mL Mouth Rinse 10 times per day  . pantoprazole (PROTONIX) IV  40 mg Intravenous Q24H     BMET    Component Value Date/Time   NA 137 05/18/2020 0027   K 4.4 05/18/2020 0027   CL 107 05/18/2020 0027  CO2 17 (L) 05/18/2020 0027   GLUCOSE 159 (H) 05/18/2020 0027   BUN 72 (H) 05/18/2020 0027   CREATININE 2.80 (H) 05/18/2020 0027   CALCIUM 8.3 (L) 05/18/2020 0027   GFRNONAA 16 (L) 05/18/2020 0027   GFRAA 19 (L) 05/18/2020 0027   CBC    Component Value Date/Time   WBC 16.0 (H) 05/18/2020 0027   RBC 2.52 (L) 05/18/2020 0027   HGB 6.8 (LL) 05/18/2020 0027   HCT 23.8 (L) 05/18/2020 0027   PLT 219 05/18/2020 0027   MCV 94.4 05/18/2020 0027   MCH 27.0 05/18/2020 0027   MCHC 28.6 (L) 05/18/2020 0027   RDW 26.5 (H) 05/18/2020 0027   LYMPHSABS 0.3 (L) 05/10/2020 1344   MONOABS 0.9 05/10/2020 1344   EOSABS 0.0 05/10/2020 1344   BASOSABS 0.0 05/10/2020 1344     Assessment/Plan:  1. AKI- likely multifactorial related to transient hypovolemia/hypotensive following bowel resection as well as 3rd spacing due to hypoalbuminemia.  Low Urine sodium consistent with either pre-renal or hepatorenal syndrome.  Renal US with left renal atrophy. 1. Initially responded to IV albumin with increase UOP and improvement of BUN/Cr but now worsening after surgery and shock requiring pressors. 2.  Avoid npehrotoxins and hypotension.  No indications for RRT at this time but will continue to follow. 3. Will add IV albumin and follow UOP. 2. Anastomotic leak post left hemicolectomy with pneumoperitoneum- s/p ex-lap with LOA and transverse loop colostomy 9/20 3. VDRF post operatively- now in ICU and managed by PCCM. 4. Hyponatremia- due to 3rd spacing and edema.  Improving.  5. Colon cancer- locally invasive s/p resection.  Nodes negative. 6. 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  New diagnosis.will need further gi evaluation. 7. ABLA following surgery.  Transfuse and follow H/H.  8. Thrombocytopenia - improved 9. Hypoalbuminemia- likely due to cirrhosis Donetta Potts, MD [facility] [phone]

## 2020-05-18 NOTE — Consult Note (Signed)
[place] Nurse ostomy follow up Patient receiving care in [place]. Patient is intubated and non-interactive. Stoma type/location: LUQ loop colostomy Stomal assessment/size: 1.5 inches with red rubber cath support device Peristomal assessment: deferred Treatment options for stomal/peristomal skin: barrier ring  Output: drops of serosanginous in existing pouch  Ostomy pouching: 2pc. 2 and 3/4 inches with barrier rings requested Education provided: none Enrolled patient in [employer] Discharge program: No Val Riles, RN, MSN, [employer], CNS-BC, pager [phone]

## 2020-05-18 NOTE — Progress Notes (Signed)
Patient's MAPs suddenly dropped to the 20s. Hr dropped to 61s. Upon assessment, all infusions were continuous with no infiltration. Patient placed in trend with no real effect.  Patient remained responsive. Levo titrated to 20 mcgs. MD paged. Zoll pads placed.  1L LR given per MD.   CVC placed for better access at this time.  Receiving RN, Gabe at bedside.   Patient VSS.

## 2020-05-18 NOTE — Anesthesia Postprocedure Evaluation (Signed)
Anesthesia Post Note  Patient: Regina Richards  Procedure(s) Performed: EXPLORATORY LAPAROTOMY, ABDOMINAL Cory Roughen PLACEMENT (N/A Abdomen) LYSIS OF ADHESION (Abdomen) COLOSTOMY (Left Abdomen)     Patient location during evaluation: SICU Anesthesia Type: General Level of consciousness: sedated Pain management: pain level controlled Vital Signs Assessment: post-procedure vital signs reviewed and stable Respiratory status: patient remains intubated per anesthesia plan Cardiovascular status: stable Postop Assessment: no apparent nausea or vomiting Anesthetic complications: no   No complications documented.  Last Vitals:  Vitals:   05/18/20 0545 05/18/20 0600  BP: (!) 84/42 (!) 84/43  Pulse: 76 75  Resp: 16 16  Temp: (!) 36 C   SpO2: 100% 100%    Last Pain:  Vitals:   05/18/20 0530  TempSrc: Axillary  PainSc:                  Karyl Kinnier Khaleelah Yowell

## 2020-05-18 NOTE — Progress Notes (Signed)
Nutrition Follow-up  DOCUMENTATION CODES:   Not applicable  INTERVENTION:   Once medically able and if unable to extubate  -Vital AF 1.2 @ 20 ml/hr -Increase by 10 ml Q6 hours to goal rate of 65 ml/hr (1560 ml)  Provides: 1872 kcals, 117 grams protein, 1265 ml free water.   NUTRITION DIAGNOSIS:   Inadequate oral intake related to altered GI function as evidenced by other (clear liquid diet order).  Ongoing, NPO  GOAL:   Patient will meet greater than or equal to 90% of their needs  Not meeting  MONITOR:   PO intake, Supplement acceptance, Diet advancement, Weight trends, Labs, Skin  REASON FOR ASSESSMENT:   NPO/Clear Liquid Diet    ASSESSMENT:   74 year old female who presented on 9/03 with syncopal episode and SOB. PMH of anxiety, HTN, nephrolithiasis. Pt admitted with severe iron deficiency anemia.  9/04 - full liquid diet 9/05 - clear liquid diet 9/06 - s/p colonoscopy with findings of partially obstructing sigmoid colon mass and multiple colon polyps, clear liquid diet 9/09 - s/p left hemicolectomy with mobilization of the splenic flexure and liver biopsy, NPO then clear liquid diet later 9/12 - diet advanced to full liquids 9/13 - diet advanced to soft diet 9/20 - s/p ex-lap, LOA, transverse loop colostomy with JP drain placement   Pt discussed during ICU rounds and with RN.   Requiring pressor support. Cr trending up, no indication of CRRT at this time per nephrology. Awaiting bowel function s/p surgery. JP drain with high output given ascites found in surgery. Monitor for stool output via colostomy.   Admission weight: 78.9 kg  Current weight: 78.5 kg   Patient is currently intubated on ventilator support MV: 6.6 L/min Temp (24hrs), Avg:97.1 F (36.2 C), Min:96.4 F (35.8 C), Max:97.9 F (36.6 C)   UOP: 725 ml x 24 hrs  JP drain: 950 ml x 24 hrs   Drips: levophed, albumin Labs: Phosphorus 7.1 (H) Cr 2.8-trending up CBG 65-166  Diet Order:    Diet Order            Diet NPO time specified Except for: Sips with Meds  Diet effective now                 EDUCATION NEEDS:   No education needs have been identified at this time  Skin:  Skin Assessment: Skin Integrity Issues: Incisions: closed abdomen  Last BM:  9/20  Height:   Ht Readings from Last 1 Encounters:  05/19/2020 5' 4.02" (1.626 m)    Weight:   Wt Readings from Last 1 Encounters:  05/18/20 78.5 kg    Ideal Body Weight:  54.5 kg  BMI:  Body mass index is 29.69 kg/m.  Estimated Nutritional Needs:   Kcal:  1830-2205 kcal  Protein:  100-125 grams  Fluid:  >/= 1.8 L/day  Mariana Single RD, LDN Clinical Nutrition Pager listed in [place]

## 2020-05-18 NOTE — Progress Notes (Signed)
[facility] Progress Note  1 Day Post-Op  Subjective: Patient awake on the vent this AM, denies pain. Abdomen is distended, no stool output yet. High volume from JP.   Objective: Vital signs in last 24 hours: Temp:  [96.4 F (35.8 C)-97.9 F (36.6 C)] 97 F (36.1 C) (09/21 0712) Pulse Rate:  [62-89] 62 (09/21 0712) Resp:  [13-18] 16 (09/21 0712) BP: (76-152)/(32-69) 112/39 (09/21 0712) SpO2:  [89 %-100 %] 99 % (09/21 0712) Arterial Line BP: (103-118)/(28-40) 118/40 (09/21 0712) FiO2 (%):  [40 %] 40 % (09/21 0400) Weight:  [78.5 kg-80.2 kg] 78.5 kg (09/21 0400) Last BM Date: 05/16/20  Intake/Output from previous day: 09/20 0701 - 09/21 0700 In: 4840.7 [P.O.:10; I.V.:1456.9; Blood:523; IV Piggyback:2850.8] Out: 1725 [Urine:725; Drains:950; Blood:50] Intake/Output this shift: Total I/O In: 315 [Blood:315] Out: -   PE: General: WD,overweight female who ison the vent but appears comfortable Heart: regular, rate, and rhythm.  Lungs: vent Abd: soft,non-tender,distended,midline wound without purulence, stoma pink with red rubber bridge present, JP in RLQ with bloody fluid MS: +4 pitting edema in BLEs Skin: warm and dry with no masses, lesions, or rashes    Lab Results:  Recent Labs    05/22/2020 2249 05/21/2020 2249 05/25/2020 2257 05/18/20 0027  WBC 21.7*  --   --  16.0*  HGB 7.3*   < > 8.2* 6.8*  HCT 25.6*   < > 24.0* 23.8*  PLT 281  --   --  219   < > = values in this interval not displayed.   BMET Recent Labs    05/06/2020 2249 05/20/2020 2249 05/23/2020 2257 05/18/20 0027  NA 137   < > 137 137  K 4.5   < > 4.1 4.4  CL 106  --   --  107  CO2 17*  --   --  17*  GLUCOSE 107*  --   --  159*  BUN 71*  --   --  72*  CREATININE 2.65*  --   --  2.80*  CALCIUM 8.3*  --   --  8.3*   < > = values in this interval not displayed.   PT/INR Recent Labs    05/21/2020 2249  LABPROT 20.7*  INR 1.9*   CMP     Component Value Date/Time   NA 137 05/18/2020  0027   K 4.4 05/18/2020 0027   CL 107 05/18/2020 0027   CO2 17 (L) 05/18/2020 0027   GLUCOSE 159 (H) 05/18/2020 0027   BUN 72 (H) 05/18/2020 0027   CREATININE 2.80 (H) 05/18/2020 0027   CALCIUM 8.3 (L) 05/18/2020 0027   PROT 5.6 (L) 05/15/2020 0102   ALBUMIN 3.1 (L) 05/18/2020 0027   AST 31 05/15/2020 0102   ALT 26 05/15/2020 0102   ALKPHOS 106 05/15/2020 0102   BILITOT 1.1 05/15/2020 0102   GFRNONAA 16 (L) 05/18/2020 0027   GFRAA 19 (L) 05/18/2020 0027   Lipase  No results found for: LIPASE     Studies/Results: DG CHEST PORT 1 VIEW  Result Date: 05/02/2020 CLINICAL DATA:  Intubation EXAM: PORTABLE CHEST 1 VIEW COMPARISON:  Chest x-ray 05/05/2020 FINDINGS: Interval placement of an endotracheal tube with tip terminating 2 cm above the carina. Suggestion of an enteric tube noted coursing below the diaphragm with side port collimated off view. The heart size and mediastinal contours are unchanged. Low lung volumes with compressive changes bibasilar early. Biapical pleural/pulmonary scarring. No pulmonary edema. Trace left pleural effusion. Possible trace right  pleural effusion. No pneumothorax. Suggestion of pneumoperitoneum under the right hemidiaphragm. No acute osseous abnormality. IMPRESSION: 1. Interval placement endotracheal tube with tip terminating 2 cm above the carina. 2. Suggestion of an enteric tube coursing below diaphragm with side port collimated off view. 3. Low lung volumes with bilateral trace pleural effusions, left greater than right. 4. Suggestion of pneumoperitoneum which is consistent with patient's postsurgical state status post exploratory laparotomy. Electronically Signed   By: Iven Finn M.D.   On: 05/23/2020 23:26   DG Chest Port 1 View  Result Date: 05/22/2020 CLINICAL DATA:  Colon cancer status post recent colectomy, severe abdominal pain, shortness of breath EXAM: PORTABLE CHEST 1 VIEW COMPARISON:  05/09/2020 FINDINGS: Upright frontal view of the chest  demonstrates interval development of pneumoperitoneum most pronounced under the right hemidiaphragm. Lung volumes are diminished, with bibasilar atelectasis. Trace left effusion. No pneumothorax. The cardiac silhouette is unremarkable. IMPRESSION: 1. Interval development of pneumoperitoneum, concerning for anastomotic breakdown given recent bowel surgery. 2. Low lung volumes, with bibasilar atelectasis and trace left effusion. Critical Value/emergent results were called by telephone at the time of interpretation on 05/07/2020 at 4:56 pm to provider [facility] , who verbally acknowledged these results. Electronically Signed   By: Randa Ngo M.D.   On: 04/30/2020 16:52    Anti-infectives: Anti-infectives (From admission, onward)   Start     Dose/Rate Route Frequency Ordered Stop   05/18/20 1600  vancomycin (VANCOREADY) IVPB 750 mg/150 mL        750 mg 150 mL/hr over 60 Minutes Intravenous Every 24 hours 05/24/2020 1609     05/18/20 1600  ceFEPIme (MAXIPIME) 2 g in sodium chloride 0.9 % 100 mL IVPB  Status:  Discontinued        2 g 200 mL/hr over 30 Minutes Intravenous Every 24 hours 05/26/2020 1609 05/06/2020 1610   05/11/2020 2230  metroNIDAZOLE (FLAGYL) IVPB 500 mg        500 mg 100 mL/hr over 60 Minutes Intravenous Every 8 hours 04/29/2020 2223     05/25/2020 1615  ceFEPIme (MAXIPIME) 2 g in sodium chloride 0.9 % 100 mL IVPB        2 g 200 mL/hr over 30 Minutes Intravenous Every 24 hours 05/23/2020 1610     05/12/2020 1545  vancomycin (VANCOREADY) IVPB 1500 mg/300 mL        1,500 mg 150 mL/hr over 120 Minutes Intravenous  Once 05/20/2020 1544 05/18/20 0234   05/13/2020 1545  ceFEPIme (MAXIPIME) 2 g in sodium chloride 0.9 % 100 mL IVPB  Status:  Discontinued        2 g 200 mL/hr over 30 Minutes Intravenous  Once 05/20/2020 1544 05/10/2020 1610   05/21/2020 0600  cefoTEtan (CEFOTAN) 2 g in sodium chloride 0.9 % 100 mL IVPB        2 g 200 mL/hr over 30 Minutes Intravenous On call to O.R. 05/05/20 1533 05/24/2020  1106   05/05/20 1600  amoxicillin (AMOXIL) capsule 500 mg        500 mg Oral Every 12 hours 05/05/20 1447 05/10/20 2159       Assessment/Plan Anxiety Cirrhosis - Child's Pugh B initially, significant ascites intra-op yesterday  ABL Anemia -hgb6.8 this AM, s/p 1 unit PRBC AKI - Cr 2.8 and UOP low  VDRF - vent per CCM, appreciate assistance Septic shock - on low dose pressor this AM  Colon mass S/P left hemicolectomy and liver biopsy9/9 Dr. Georgette Dover Pneumoperitoneum POD#11 S/p ex-lap, LOA, transverse  loop colostomy with drain placement 9/20 Dr. Kae Heller - POD#12/POD#1 - BID wet to dry dressing to midline wound - abdominal binder - drain with bloody fluid - 950 cc since surgery  - stoma pink with SS fluid in bag, await bowel function  - continue abx  FEN -NPO, IVF, NGT to LIWS VTE -SQ heparin ID -vanc/cefepime/flagyl  LOS: 18 days    Norm Parcel , [facility] 05/18/2020, 7:59 AM Please see Amion for pager number during day hours 7:00am-4:30pm

## 2020-05-18 NOTE — Progress Notes (Signed)
PT Cancellation Note  Patient Details Name: Regina Richards MRN: [id number] DOB: March 07, 1946   Cancelled Treatment:    Reason Eval/Treat Not Completed: Patient not medically ready (pt remains on vent post op await clearance to resume mobility)   Shayne Diguglielmo B Denzil Mceachron 05/18/2020, 6:41 AM  Bayard Males, PT Acute Rehabilitation Services Pager: [phone] Office: [phone]

## 2020-05-18 NOTE — Progress Notes (Signed)
Hypoglycemic Event  CBG: 65  Treatment: 12.5gm d50  Symptoms: none  Follow-up CBG: Time:0012 CBG Result:73  Possible Reasons for Event: npo  Comments/MD notified:protocol executed   Amo Kuffour, [employer]

## 2020-05-18 NOTE — Progress Notes (Signed)
eLink Physician-Brief Progress Note Patient Name: Regina Richards DOB: Jan 13, 1946 MRN: [id number]   Date of Service  05/18/2020  HPI/Events of Note  Hypotension - Nursing request for A-line.  eICU Interventions  Plan: 1. RT to place A-line.      Intervention Category Major Interventions: Other:  [name] Cornelia Copa 05/18/2020, 4:20 AM

## 2020-05-18 NOTE — Progress Notes (Addendum)
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  PCCM consulted for postop ICU/vent management  History of present illness   74 yo female with PMH anxiety, hypertension and renal stones presented to the emergency room on 9/3 for shortness of breath x3 to 4 weeks. Patient underwent colonoscopy and EGD on 9/6.  Found to have a sigmoid colon mass, partially obstructing plus multiple polyps. General surgery consulted.  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  Patient underwent a left hemicolectomy as well as a liver biopsy on 9/9.  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   9/20 developed worsening pain, shortness of breath and leukocytosis, chest x-ray showed new pneumoperitoneum.  After discussion with her sisters regarding high peri-operative mortality , she underwent laparotomy, area of anastomotic leak was identified, adhesions were lysed and transverse colostomy was created.  She required 750 cc of albumin and 2 L of crystalloid during the procedure.  No blood was given.  She required Neo-Synephrine during the procedure but was weaned off and transferred to the ICU intubated and sedated on propofol. PCCM consulted for postop ICU management  Past Medical History  Renal calculi New diagnosis of colon cancer and child B cirrhosis during this admission  Significant Hospital Events   9/20 >> pneumoperitoneum  Consults:  General surgery  Procedures:  9/6 colonoscopy 9/9 left  hemicolectomy 9/20 exploratory laparotomy, LOA, abdominal washout, transverse loop colostomy, JP drain into pelvis and left colic gutter Intra-Op findings: Large volume pneumoperitoneum, 5 L of ascites with phlegmonous change in the left upper quadrant and around the colonic anastomosis on the left side, extensive inflammatory adhesions  Significant Diagnostic Tests:  CT abdomen/pelvis 9/6 Wall thickening focally involving the distal descending colon, most likely the site of primary. No dominant sigmoid mass identified. 2. No findings of metastatic disease within the abdomen or pelvis. 3. Bilateral pleural effusions and ascites 4. Hepatic morphology which suggests cirrhosis.  5. Bilateral nephrolithiasis.  US renal 9/14 >> no hydronephrosis, left renal atrophy, multiple small bilateral calculi, small ascites  Micro Data:  Peritoneal fluid 9/20>> [facility] 9/20 >> BC 9/12 >> ng Urine 9/5 >> Enterococcus faecalis  Antimicrobials:  Cefepime 9/20>> Flagyl 9/20>> Vancomycin 9/20 >>  Interim history/subjective:  Awake on vent this AM.  Required 1 unit pRBC after ex-lap yesterday and another this AM.   Objective   Blood pressure (!) 166/45, pulse (!) 58, temperature (!) 97 F (36.1 C), temperature source Axillary, resp. rate 16, height 5' 4.02" (1.626 m), weight 78.5 kg, SpO2 97 %.    Vent Mode: PRVC FiO2 (%):  [40 %] 40 % Set Rate:  [16 bmp] 16 bmp Vt Set:  [440 mL] 440 mL PEEP:  [5 cmH20] 5 cmH20 Plateau Pressure:  [15 cmH20-16 cmH20] 15 cmH20   Intake/Output Summary (Last 24 hours) at 05/18/2020 0951 Last data filed at 05/18/2020 0900 Gross per 24 hour  Intake 5445.41 ml  Output 1785 ml  Net 3660.41 ml   Filed Weights   05/23/2020 0317 05/08/2020 1855 05/18/20 0400  Weight: 80.2 kg 80.2 kg  78.5 kg    Examination: General: Ill-appearing elderly woman, sedated, in NAD. HENT:  Oral ETT in place, moderate pallor. Lungs: bilaterally mechanically ventilated breath sounds. CV:  regular rate and normal rhythm, s1s2 heard, no m/r/g Abdomen: Soft, non-tender, moderately distended. Stoma pink, JP in RLQ with bloody fluid. Extremities: cool and dry, +3-4 pitting edema bilaterally. Neuro: Sedated on propofol, but awake.   Resolved Hospital Problem list     Assessment & Plan:    Anastomotic leak post left hemicolectomy and liver biopsy on 9/9 Pneumoperitoneum S/p ex-lap, LOA, transverse loop colostomy 9/20 Cirrhosis, newly diagnosed, MELD score 17 Post-op respiratory failure SBP AKI Acute blood loss anemia s/p ex-lap Metabolic acidosis  -surgery following for postop colostomy care -cont full vent support -trend ABGs and CXRs -SBTs as tolerated with goal to extubate -propofol/fentanyl for pain and ventilator sedation -on levo for pressor support, wean as tolerated -vanc/cefepime/flagyl for SBP, empiric coverage until culture data results -Afebrile, WBCs downtrending (21 --> 16) -transfuse for Hgb <7, has required 2 units since ex-lap on 9/20 -supportive care for cirrhosis -Cr 2.8 on 9/21; was normal at admission -UOP 0.7L overnight;  +12L since admission -trend renal function panel -strict I/O's -ABG: pH 7.21 / pCO2 43.7 / pO2 77 / HCO3 18 -bicarb in D5 for met acidosis   Best practice:  Diet: NPO Pain/Anxiety/Delirium protocol (if indicated): Propofol/fentanyl VAP protocol (if indicated): Y DVT prophylaxis: Boyds heparin GI prophylaxis: Protonix Glucose control: N/A Mobility: Bedrest Code Status: Full code Family Communication: Per surgery Disposition: ICU   Labs   CBC: Recent Labs  Lab 05/15/20 0102 05/15/20 0102 05/16/20 0315 05/02/2020 0046 05/08/2020 2249 05/23/2020 2257 05/18/20 0027  WBC 13.0*  --  13.5* 19.5* 21.7*  --  16.0*  HGB 9.6*   < > 9.0* 10.7* 7.3* 8.2* 6.8*  HCT 31.4*   < > 30.0* 35.7* 25.6* 24.0* 23.8*  MCV 87.7  --  89.3 89.3 93.4  --  94.4  PLT 297  --  286 330 281  --  219   < > = values in this interval not  displayed.    Basic Metabolic Panel: Recent Labs  Lab 05/15/20 0102 05/15/20 0102 05/16/20 0315 05/15/2020 0046 05/16/2020 2249 05/16/2020 2257 05/18/20 0027  NA 130*   < > 132* 136 137 137 137  K 4.2   < > 3.5 3.7 4.5 4.1 4.4  CL 98  --  99 103 106  --  107  CO2 20*  --  20* 19* 17*  --  17*  GLUCOSE 94  --  78 90 107*  --  159*  BUN 68*  --  74* 69* 71*  --  72*  CREATININE 2.62*  --  2.32* 2.25* 2.65*  --  2.80*  CALCIUM 8.7*  --  8.6* 9.2 8.3*  --  8.3*  MG  --   --   --   --  2.2  --  2.3  PHOS  --   --   --   --   --   --  7.1*   < > = values in this interval not displayed.   GFR: Estimated Creatinine Clearance: 18.1 mL/min (A) (by C-G formula based on SCr of 2.8 mg/dL (H)). Recent Labs  Lab 05/16/20 0315 05/22/2020 0046 05/15/2020 1221 05/19/2020 1844 05/11/2020 2249 05/18/20 0027  PROCALCITON  --   --  3.91  --   --   --   WBC 13.5* 19.5*  --   --  21.7* 16.0*  LATICACIDVEN  --   --  2.5* 2.6* 2.6*  --     Liver Function Tests: Recent Labs  Lab 05/13/20 0749 05/15/20 0102 05/18/20 0027  AST 57* 31  --   ALT 31 26  --   ALKPHOS 184* 106  --   BILITOT 0.8 1.1  --   PROT 5.4* 5.6*  --   ALBUMIN 1.9* 2.7* 3.1*   No results for input(s): LIPASE, AMYLASE in the last 168 hours. Recent Labs  Lab 05/11/20 1116 04/29/2020 1221  AMMONIA 39* 40*    ABG    Component Value Date/Time   PHART 7.218 (L) 05/11/2020 2257   PCO2ART 43.7 05/05/2020 2257   PO2ART 77 (L) 05/23/2020 2257   HCO3 18.0 (L) 05/12/2020 2257   TCO2 19 (L) 04/29/2020 2257   ACIDBASEDEF 9.0 (H) 05/15/2020 2257   O2SAT 93.0 05/05/2020 2257     Coagulation Profile: Recent Labs  Lab 05/26/2020 2249  INR 1.9*    Cardiac Enzymes: No results for input(s): CKTOTAL, CKMB, CKMBINDEX, TROPONINI in the last 168 hours.  HbA1C: No results found for: HGBA1C  CBG: Recent Labs  Lab 05/12/2020 2346 05/18/20 0016 05/18/20 0104 05/18/20 0337 05/18/20 0744  GLUCAP 65* 73 118* 166* 189*    Review of  Systems:   Unable to obtain since intubated and sedated  Past Medical History  She,  has a past medical history of Anxiety, Arthritis, Dysrhythmia, History of kidney stones, Hypertension, Pneumonia (last 10 yrs ago), and PONV (postoperative nausea and vomiting).   Surgical History    Past Surgical History:  Procedure Laterality Date  . BIOPSY  05/04/2020   Procedure: BIOPSY;  Surgeon: Ladene Artist, MD;  Location: [place];  Service: Endoscopy;;  . COLONOSCOPY WITH PROPOFOL N/A 05/20/2020   Procedure: COLONOSCOPY WITH PROPOFOL;  Surgeon: Ladene Artist, MD;  Location: [facility] ENDOSCOPY;  Service: Endoscopy;  Laterality: N/A;  . CYSTOSCOPY W/ URETERAL STENT PLACEMENT Bilateral 08/03/2016   Procedure: CYSTOSCOPY WITH BILATERAL STENT REPLACEMENT, BILATERAL RETROGRADE PYELOGRAM;  Surgeon: Kathie Rhodes, MD;  Location: [facility];  Service: Urology;  Laterality: Bilateral;  . CYSTOSCOPY/RETROGRADE/URETEROSCOPY/STONE EXTRACTION WITH BASKET Bilateral 09/08/2016   Procedure: BILATERAL CYSTOSCOPY/RETROGRADE/URETEROSCOPY/STONE EXTRACTION / HOLMIUM LASER /STENT REMOVAL;  Surgeon: Kathie Rhodes, MD;  Location: [facility];  Service: Urology;  Laterality: Bilateral;  . CYSTOSCOPY/URETEROSCOPY/HOLMIUM LASER/STENT PLACEMENT Right 09/23/2018   Procedure: CYSTOSCOPY/RETROGRADE/URETEROSCOPY/STENT PLACEMENT RIGHT ;  Surgeon: Kathie Rhodes, MD;  Location: [facility];  Service: Urology;  Laterality: Right;  . HEMOSTASIS CLIP PLACEMENT  04/29/2020   Procedure: HEMOSTASIS CLIP PLACEMENT;  Surgeon: Ladene Artist, MD;  Location: [facility]COPY;  Service: Endoscopy;;  . LAPAROTOMY N/A 05/12/2020   Procedure: EXPLORATORY LAPAROTOMY;  Surgeon: Donnie Mesa, MD;  Location: [place];  Service: General;  Laterality: N/A;  . LIVER BIOPSY N/A 05/19/2020   Procedure: LIVER BIOPSY;  Surgeon: Donnie Mesa, MD;  Location: [place];  Service: General;  Laterality: N/A;  . PARTIAL COLECTOMY N/A 05/14/2020   Procedure: PARTIAL  COLECTOMY;  Surgeon: Donnie Mesa, MD;  Location: [place];  Service: General;  Laterality: N/A;  . polyp removed from uterus    . POLYPECTOMY  05/08/2020   Procedure: POLYPECTOMY;  Surgeon: Ladene Artist, MD;  Location: [facility] ENDOSCOPY;  Service: Endoscopy;;  . SUBMUCOSAL TATTOO INJECTION  05/22/2020   Procedure: SUBMUCOSAL TATTOO INJECTION;  Surgeon: Ladene Artist, MD;  Location: [facility]COPY;  Service: Endoscopy;;     Social History   reports that  she has never smoked. She has never used smokeless tobacco. She reports current alcohol use of about 14.0 standard drinks of alcohol per week. She reports that she does not use drugs.   Family History   Her family history is not on file.   Allergies Allergies  Allergen Reactions  . Sulfa Antibiotics Rash    Rash on legs     Home Medications  Prior to Admission medications   Medication Sig Start Date End Date Taking? Authorizing Provider  PARoxetine (PAXIL) 40 MG tablet TAKE 1 TABLET(40 MG) BY MOUTH EVERY MORNING Patient taking differently: Take 40 mg by mouth daily.  05/09/2020  Yes Hurst, Dorothea Glassman, PA-C  acetaminophen (TYLENOL) 325 MG tablet Take 2 tablets (650 mg total) by mouth every 6 (six) hours as needed for mild pain (or Fever >/= 101). 05/12/20   Norm Parcel, PA-C  ciprofloxacin (CIPRO) 500 MG tablet Take 500 mg by mouth 2 (two) times daily. 05/12/2020   [provider]  methocarbamol (ROBAXIN) 500 MG tablet Take 1 tablet (500 mg total) by mouth every 8 (eight) hours as needed for muscle spasms. 05/12/20   Norm Parcel, PA-C  oxyCODONE (OXY IR/ROXICODONE) 5 MG immediate release tablet Take 1 tablet (5 mg total) by mouth every 6 (six) hours as needed for moderate pain or severe pain. 05/12/20   Norm Parcel, PA-C     Critical care time: 58m    Jinwala, Sagar, MD 05/18/2020, 9:51 AM    PCCM:   757FM, admitted s/p anastomotic leak, post-left hemicolectomy and liver biopsy, pneumoperitonenum, s/p exlap, cirrhosis,  SBP, AKI.   BP (!) 124/45   Pulse (!) 58   Temp (!) 97.3 F (36.3 C)   Resp 16   Ht 5' 4.02" (1.626 m)   Wt 78.5 kg   LMP  (LMP Unknown)   SpO2 96%   BMI 29.69 kg/m   Gen: elderly FM  Heart: RRR, s1 s2 Lungs: BL vented breaths   Labs: reviewed   A: Anastomotic leak Left hemicolectomy for sigmoid colon ca Pneumoperitoneum  S/p ex-lap  Cirrhosis, MELD 17 SBP Peritonitis, septic shock secondary to this  AKI  Acute blood loss anemia secondary to above   P: Post-op care per surgery  Remains on vent  titrating pressors to maintain MAP >658mg  - vanco, cefepime, flagyl  Scheduled albumin  Follow uop  And BMP  Remains on bicarb infusion   This patient is critically ill with multiple organ system failure; which, requires frequent high complexity decision making, assessment, support, evaluation, and titration of therapies. This was completed through the application of advanced monitoring technologies and extensive interpretation of multiple databases. During this encounter critical care time was devoted to patient care services described in this note for 34 minutes.  [facility] 05/18/2020 6:17 PM

## 2020-05-18 NOTE — Procedures (Signed)
Arterial Catheter Insertion Procedure Note  Regina Richards  [id number]  [birth year]  Date:05/18/20  Time:6:03 AM    Provider Performing: Lynann Bologna    Procedure: Insertion of Arterial Line [phone]) without US guidance  Indication(s) Blood pressure monitoring and/or need for frequent ABGs  Consent Unable to obtain consent due to emergent nature of procedure.  Anesthesia None   Time Out Verified patient identification, verified procedure, site/side was marked, verified correct patient position, special equipment/implants available, medications/allergies/relevant history reviewed, required imaging and test results available.   Sterile Technique Maximal sterile technique including full sterile barrier drape, hand hygiene, sterile gown, sterile gloves, mask, hair covering, sterile ultrasound probe cover (if used).   Procedure Description Area of catheter insertion was cleaned with chlorhexidine and draped in sterile fashion. Without real-time ultrasound guidance an arterial catheter was placed into the left radial artery.  Appropriate arterial tracings confirmed on monitor.     Complications/Tolerance None; patient tolerated the procedure well.   EBL Minimal   Specimen(s) None

## 2020-05-18 NOTE — Procedures (Signed)
Central Venous Catheter Insertion Procedure Note  Regina Richards  [id number]  14-Jul-1946  Date:05/18/20  Time:11:30 AM   Provider Performing:Brynna Dobos Shearon Stalls   Procedure: Insertion of Non-tunneled Central Venous Catheter(36556) with US guidance (22411)   Indication(s) Medication administration and Difficult access  Consent Unable to obtain consent due to emergent nature of procedure.  Anesthesia Topical only with 1% lidocaine   Timeout Verified patient identification, verified procedure, site/side was marked, verified correct patient position, special equipment/implants available, medications/allergies/relevant history reviewed, required imaging and test results available.  Sterile Technique Maximal sterile technique including full sterile barrier drape, hand hygiene, sterile gown, sterile gloves, mask, hair covering, sterile ultrasound probe cover (if used).  Procedure Description Area of catheter insertion was cleaned with chlorhexidine and draped in sterile fashion.  With real-time ultrasound guidance a central venous catheter was placed into the left internal jugular vein. Nonpulsatile blood flow and easy flushing noted in all ports.  The catheter was sutured in place and sterile dressing applied.  Complications/Tolerance None; patient tolerated the procedure well. Chest X-ray is ordered to verify placement for internal jugular or subclavian cannulation.   Chest x-ray is not ordered for femoral cannulation.  EBL Minimal  Specimen(s) None  Attempted left subclavian initially; however, poor flow upon vessel cannulation.  Aborted and moved to IJ approach.   Montey Hora, [place] [facility] 05/18/2020, 11:31 AM

## 2020-05-19 ENCOUNTER — Inpatient Hospital Stay: Payer: Self-pay

## 2020-05-19 DIAGNOSIS — Z9889 Other specified postprocedural states: Secondary | ICD-10-CM

## 2020-05-19 LAB — RENAL FUNCTION PANEL
Albumin: 3.4 g/dL — ABNORMAL LOW (ref 3.5–5.0)
Anion gap: 13 (ref 5–15)
BUN: 66 mg/dL — ABNORMAL HIGH (ref 8–23)
CO2: 25 mmol/L (ref 22–32)
Calcium: 8.5 mg/dL — ABNORMAL LOW (ref 8.9–10.3)
Chloride: 101 mmol/L (ref 98–111)
Creatinine, Ser: 2.57 mg/dL — ABNORMAL HIGH (ref 0.44–1.00)
GFR calc Af Amer: 21 mL/min — ABNORMAL LOW (ref >60)
GFR calc non Af Amer: 18 mL/min — ABNORMAL LOW (ref >60)
Glucose, Bld: 156 mg/dL — ABNORMAL HIGH (ref 70–99)
Phosphorus: 5 mg/dL — ABNORMAL HIGH (ref 2.5–4.6)
Potassium: 3.2 mmol/L — ABNORMAL LOW (ref 3.5–5.1)
Sodium: 139 mmol/L (ref 135–145)

## 2020-05-19 LAB — PROTIME-INR
INR: 1.6 — ABNORMAL HIGH (ref 0.8–1.2)
Prothrombin Time: 18.5 seconds — ABNORMAL HIGH (ref 11.4–15.2)

## 2020-05-19 LAB — CBC
HCT: 21.1 % — ABNORMAL LOW (ref 36.0–46.0)
HCT: 30.9 % — ABNORMAL LOW (ref 36.0–46.0)
Hemoglobin: 6.6 g/dL — CL (ref 12.0–15.0)
Hemoglobin: 9.5 g/dL — ABNORMAL LOW (ref 12.0–15.0)
MCH: 27.6 pg (ref 26.0–34.0)
MCH: 27.9 pg (ref 26.0–34.0)
MCHC: 31.3 g/dL (ref 30.0–36.0)
MCHC: 30.7 g/dL (ref 30.0–36.0)
MCV: 88.3 fL (ref 80.0–100.0)
MCV: 90.9 fL (ref 80.0–100.0)
Platelets: 167 10*3/uL (ref 150–400)
Platelets: 172 10*3/uL (ref 150–400)
RBC: 2.39 MIL/uL — ABNORMAL LOW (ref 3.87–5.11)
RBC: 3.4 MIL/uL — ABNORMAL LOW (ref 3.87–5.11)
RDW: 22 % — ABNORMAL HIGH (ref 11.5–15.5)
RDW: 19.4 % — ABNORMAL HIGH (ref 11.5–15.5)
WBC: 10.5 10*3/uL (ref 4.0–10.5)
WBC: 13.7 10*3/uL — ABNORMAL HIGH (ref 4.0–10.5)
nRBC: 0 % (ref 0.0–0.2)
nRBC: 0 % (ref 0.0–0.2)

## 2020-05-19 LAB — BPAM FFP
Blood Product Expiration Date: 202109252359
ISSUE DATE / TIME: 202109210152
Unit Type and Rh: 5100

## 2020-05-19 LAB — PREPARE FRESH FROZEN PLASMA

## 2020-05-19 LAB — GLUCOSE, CAPILLARY
Glucose-Capillary: 102 mg/dL — ABNORMAL HIGH (ref 70–99)
Glucose-Capillary: 111 mg/dL — ABNORMAL HIGH (ref 70–99)
Glucose-Capillary: 123 mg/dL — ABNORMAL HIGH (ref 70–99)
Glucose-Capillary: 131 mg/dL — ABNORMAL HIGH (ref 70–99)
Glucose-Capillary: 143 mg/dL — ABNORMAL HIGH (ref 70–99)
Glucose-Capillary: 161 mg/dL — ABNORMAL HIGH (ref 70–99)

## 2020-05-19 LAB — PREPARE RBC (CROSSMATCH)

## 2020-05-19 MED ORDER — ORAL CARE MOUTH RINSE
15.0000 mL | Freq: Two times a day (BID) | OROMUCOSAL | Status: DC
  Administered 2020-05-19 – 2020-05-27 (×14): 15 mL via OROMUCOSAL

## 2020-05-19 MED ORDER — POTASSIUM CHLORIDE 10 MEQ/50ML IV SOLN
10.0000 meq | INTRAVENOUS | Status: AC
  Administered 2020-05-19 (×5): 10 meq via INTRAVENOUS
  Filled 2020-05-19 (×4): qty 50

## 2020-05-19 MED ORDER — SODIUM CHLORIDE 0.9% IV SOLUTION: Freq: Once | INTRAVENOUS | Status: AC

## 2020-05-19 MED ORDER — POTASSIUM CHLORIDE 10 MEQ/50ML IV SOLN: 10.0000 meq | INTRAVENOUS | Status: DC

## 2020-05-19 MED ORDER — POTASSIUM CHLORIDE 10 MEQ/50ML IV SOLN
10.0000 meq | INTRAVENOUS | Status: AC
  Administered 2020-05-19 (×3): 10 meq via INTRAVENOUS
  Filled 2020-05-19 (×4): qty 50

## 2020-05-19 MED ORDER — TRAVASOL 10 % IV SOLN
INTRAVENOUS | Status: AC
  Filled 2020-05-19: qty 547.2

## 2020-05-19 MED ORDER — INSULIN ASPART 100 UNIT/ML ~~LOC~~ SOLN
0.0000 [IU] | SUBCUTANEOUS | Status: DC
  Administered 2020-05-19: 2 [IU] via SUBCUTANEOUS

## 2020-05-19 NOTE — Progress Notes (Signed)
CRITICAL VALUE ALERT  Critical Value:  Hgb 6.6   Date & Time Notied:  0815 05/19/20  Provider Notified: Barkley Boards PA notified face to face at bedside   Orders Received/Actions taken: See new orders

## 2020-05-19 NOTE — Procedures (Signed)
Extubation Procedure Note  Patient Details:   Name: Regina Richards DOB: Feb 21, 1946 MRN: [id number]   Airway Documentation:  Airway 8 mm (Active)  Secured at (cm) 24 cm 05/19/20 0800  Measured From Lips 05/19/20 0800  Secured Location Left 05/19/20 0757  Secured By [employer] 05/19/20 0757  Tube Holder Repositioned Yes 05/19/20 0757  Cuff Pressure (cm H2O) 24 cm H2O 05/18/20 1110  Site Condition Cool;Dry 05/19/20 0757   Vent end date: 05/19/20 Vent end time: 1101   Evaluation  O2 sats: stable throughout Complications: No apparent complications Patient did tolerate procedure well. Bilateral Breath Sounds: Clear, Diminished   Yes, pt could speak post extubation.  Pt extubated to 4 l/m [place] per physician's order without difficulty.  Earney Navy 05/19/2020, 11:02 AM

## 2020-05-19 NOTE — Progress Notes (Addendum)
PHARMACY - TOTAL PARENTERAL NUTRITION CONSULT NOTE  Indication: Postoperative ileus  Patient Measurements: Height: 5' 4.02" (162.6 cm) Weight: 80.9 kg (178 lb 5.6 oz) IBW/kg (Calculated) : 54.74 TPN AdjBW (KG): 61.1 Body mass index is 30.6 kg/m. EDW = 73-74 kg  Assessment:  88 YOF admitted 9/3 due to severe iron deficiency, thrombocytopenia, and cirrhosis. Colonoscopy on 9/6 positive for sigmoid mass; CT and biopsy supports diagnosis of colon cancer. Hemicolectomy performed 9/9. After patient's surgery on 9/6, dietary tolerance improved from clear liquid to soft diet on 9/13. Patient was tolerating soft diet until patient developed pneumoperineum requiring ex lap and loop colostomy with drain placement on 9/20. Due to postoperative ileus pharmacy consulted to start TPN.    Glucose / Insulin: CBGs 143-202. Not currently on SSI.  Electrolytes: K 3.2 (ileus goal >/=4) -KCl x8 runs per MD. Phos 5 high. CoCa 8.9 low normal.  Renal: AKI- baseline ~1.0 Scr 2.57 BUN 66 LFTs / TGs: 9/18- AST/ALT/alk phos WNL; TG N/A Prealbumin / albumin: albumin 3.4; prealbumin N/A Intake / Output; MIVF: 0.7 mL/kg/hr, NGT 52m/24hrs, drains 15662m24hrs LBM 9/20, net +12.6L - off bicarb gtt today GI Imaging: None since TPN start Surgeries / Procedures: None since TPN start  Central access: CVC placed 05/18/20 TPN start date: 05/19/20  Nutritional Goals (per RD rec 9/22): kCal: 1950-2150, Protein: 100-125g, Fluid: >/= 1.8L Goal TPN rate is 85 mL/hr  Current Nutrition:  TPN  Plan:  Start TPN at 4074mr at 1800 providing 55g AA, 130g CHO, 29g ILE, and total kcal of 948; meeting ~50% of the patients needs.  Electrolytes in TPN: Decrease K to 43m2m, remove Phos, and continue standard of 50mE52mof Na, 5mEq/58mf Ca, 5mEq/L58m Mg. Cl:Ac ratio 1:2 Add standard MVI and trace elements to TPN, add thiamine 100mg [id number] acid 1mg d/t68m EtOH use Initiate Moderate q4h SSI and adjust as needed  F/U morning labs,  CBGs, and patient's tolerance to TPN   Sarah RiRedgie Grayer Candidate 05/19/2020,10:19 AM   Jaeveon Ashland D. Remigio Eisenmenger, PhMina Marble, BCPS, BCCCP 9/Imbery21, 1:35 PM

## 2020-05-19 NOTE — Consult Note (Addendum)
[place] Nurse ostomy follow up Stoma type/location: 1 1/2" round above skin level, edematous, with red rubber rod in place.  Peristomal assessment: Intact Output 50 cc of pink liquid. No stool or flatus. Ostomy pouching: 2pc. 2 3/4 flat with barrier ring Education provided: Patient is critically ill in ICU and groggy. [place] team will begin teaching when stable and out of ICU.  Enrolled patient in [place] Start Discharge program: Not Yet.  3 sets of pouches, waffers, and barrier rings at bedside for bedside nurse use.   Thank you for the consult.  Discussed plan of care with the patient and bedside nurse.  [place] nurse will follow at this time.    Cathlean Marseilles Tamala Julian, MSN, RN, [place], Lysle Pearl, [facility] Wound Treatment Associate Office [phone]  Pager [phone]

## 2020-05-19 NOTE — Progress Notes (Addendum)
Nutrition Follow-up   RD working remotely.  DOCUMENTATION CODES:   Not applicable  INTERVENTION:   - TPN per pharmacy  - RD will follow for diet advancement and add oral nutrition supplements as appropriate  NUTRITION DIAGNOSIS:   Inadequate oral intake related to altered GI function as evidenced by NPO status.  Ongoing  GOAL:   Patient will meet greater than or equal to 90% of their needs  Progressing, being addressed via initiation of TPN  MONITOR:   Labs, I & O's, Weight trends, Skin, Diet advancement  REASON FOR ASSESSMENT:   Consult New TPN/TNA  ASSESSMENT:   74 year old female who presented on 9/03 with syncopal episode and SOB. PMH of anxiety, HTN, nephrolithiasis. Pt admitted with severe iron deficiency anemia.  9/04 - full liquid diet 9/05 - clear liquid diet 9/06 - s/p colonoscopy with findings of partially obstructing sigmoid colon mass and multiple colon polyps, clear liquid diet 9/09 - s/p left hemicolectomy with mobilization of the splenic flexure and liver biopsy, NPO then clear liquid diet later 9/12 - diet advanced to full liquids 9/13 - diet advanced to soft diet 9/20 - s/p ex-lap, LOA, transverse loop colostomy with JP drain placement for pneumoperitoneum 9/22 - extubated  RD consulted for new TPN. No stool or flatus output yet from colostomy. NG tube in place to low intermittent suction.  Per Nephrology, pt with increased UOP and improved creatinine.  TPN to start today at 1800 at 40 ml/hr.  Admit weight: 78.9 kg Current weight: 80.9 kg EDW: ~74 kg  Pt with non-pitting edema to BUE and +1 pitting edema to BLE.  Medications reviewed and include: SSI q 4 hours, IV protonix, albumin, IV abx, levophed, IV KCl 10 mEq x 8 total runs  Labs reviewed: potassium 3.2, BUN 66, creatinine 2.57, phosphorus 5.0, hemoglobin 6.6 CBG's: 143-197 x 24 hours  UOP: 1295 ml x 24 hours NGT output: 25 ml x 12 hours JP drain: 1560 ml x 24 hours I/O's:  +14.9 L since admit  Diet Order:   Diet Order            Diet NPO time specified Except for: Sips with Meds  Diet effective now                 EDUCATION NEEDS:   No education needs have been identified at this time  Skin:  Skin Assessment: Skin Integrity Issues: Incisions: abdomen Other: fissure to coccyx, abrasion to buttocks  Last BM:  05/03/2020  Height:   Ht Readings from Last 1 Encounters:  05/10/2020 5' 4.02" (1.626 m)    Weight:   Wt Readings from Last 1 Encounters:  05/19/20 80.9 kg    Ideal Body Weight:  54.5 kg  BMI:  Body mass index is 30.6 kg/m.  Estimated Nutritional Needs:   Kcal:  1950-2150  Protein:  100-125 grams  Fluid:  >/= 1.8 L/day    Gaynell Face, MS, RD, LDN Inpatient Clinical Dietitian Please see AMiON for contact information.

## 2020-05-19 NOTE — Progress Notes (Signed)
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  PCCM consulted for postop ICU/vent management  History of present illness   74 yo female with PMH anxiety, hypertension and renal stones presented to the emergency room on 9/3 for shortness of breath x3 to 4 weeks. Patient underwent colonoscopy and EGD on 9/6.  Found to have a sigmoid colon mass, partially obstructing plus multiple polyps. General surgery consulted.  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  Patient underwent a left hemicolectomy as well as a liver biopsy on 9/9.  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   9/20 developed worsening pain, shortness of breath and leukocytosis, chest x-ray showed new pneumoperitoneum.  After discussion with her sisters regarding high peri-operative mortality , she underwent laparotomy, area of anastomotic leak was identified, adhesions were lysed and transverse colostomy was created.  She required 750 cc of albumin and 2 L of crystalloid during the procedure.  No blood was given.  She required Neo-Synephrine during the procedure but was weaned off and transferred to the ICU intubated and sedated on propofol. PCCM consulted for postop ICU management  Past Medical History  Renal calculi New diagnosis of colon cancer and child B cirrhosis during this admission  Significant Hospital Events   9/20 >> pneumoperitoneum  Consults:  General surgery  Procedures:  9/6 colonoscopy 9/9 left  hemicolectomy 9/20 exploratory laparotomy, LOA, abdominal washout, transverse loop colostomy, JP drain into pelvis and left colic gutter Intra-Op findings: Large volume pneumoperitoneum, 5 L of ascites with phlegmonous change in the left upper quadrant and around the colonic anastomosis on the left side, extensive inflammatory adhesions  Significant Diagnostic Tests:  CT abdomen/pelvis 9/6 Wall thickening focally involving the distal descending colon, most likely the site of primary. No dominant sigmoid mass identified. 2. No findings of metastatic disease within the abdomen or pelvis. 3. Bilateral pleural effusions and ascites 4. Hepatic morphology which suggests cirrhosis.  5. Bilateral nephrolithiasis.  US renal 9/14 >> no hydronephrosis, left renal atrophy, multiple small bilateral calculi, small ascites  Micro Data:  Peritoneal fluid 9/20>> [facility] 9/20 >> BC 9/12 >> ng Urine 9/5 >> Enterococcus faecalis  Antimicrobials:  Cefepime 9/20>> Flagyl 9/20>> Vancomycin 9/20 >>  Interim history/subjective:  Awake on vent this AM.  Required 1 unit pRBC after ex-lap yesterday and another this AM.   Objective   Blood pressure (!) 127/54, pulse (!) 59, temperature 97.7 F (36.5 C), temperature source Oral, resp. rate 10, height 5' 4.02" (1.626 m), weight 80.9 kg, SpO2 100 %.    Vent Mode: PRVC FiO2 (%):  [40 %] 40 % Set Rate:  [16 bmp] 16 bmp Vt Set:  [440 mL] 440 mL PEEP:  [5 cmH20] 5 cmH20 Plateau Pressure:  [15 cmH20-20 cmH20] 20 cmH20   Intake/Output Summary (Last 24 hours) at 05/19/2020 1048 Last data filed at 05/19/2020 0900 Gross per 24 hour  Intake 3390.4 ml  Output 2920 ml  Net 470.4 ml   Filed Weights   05/25/2020 1855 05/18/20 0400 05/19/20 0400  Weight: 80.2 kg 78.5 kg 80.9  kg    Examination: General: Ill-appearing elderly woman, awake, in NAD. HENT:  Oral ETT in place, moderate pallor. Lungs: bilaterally mechanically ventilated breath sounds.  Good TV on PS CV:  regular rate and normal rhythm, + systolic ejection murmur Abdomen: Soft, non-tender, moderately distended. Stoma pink, JP in RLQ with bloody fluid. Extremities: cool and dry, minimal edema Neuro: follows commands x 4  Cr slightly improved K low- being repleted H/H low, getting more blood Plts okay WBC improved INR pending Sugars up CXR elevated R hemidiaphragm, small bilateral effusions  Resolved Hospital Problem list     Assessment & Plan:    Anastomotic leak post left hemicolectomy and liver biopsy on 9/9 Pneumoperitoneum S/p ex-lap, LOA, transverse loop colostomy 9/20 Cirrhosis, newly diagnosed, MELD score 17 Post-op respiratory failure- improved SBP AKI- related to fluid shifts and distributive shock from peritonitis Acute blood loss anemia s/p ex-lap- ongoing issue Metabolic acidosis- resolved  - Extubate, IS - Drain care, abx, TPN per surgery - Check INR, temp, goal to prevent acidemia, coagulopathy, and hypothermia to reduce risks of post op bleeding - Monitor renal function closely   Best practice:  Diet: TPN Pain/Anxiety/Delirium protocol (if indicated): wean VAP protocol (if indicated): wean DVT prophylaxis: hold given recurrent H/H drops GI prophylaxis: Protonix Glucose control: N/A Mobility: progressive mobility Code Status: Full code Family Communication: Per surgery Disposition: ICU   The patient is critically ill with multiple organ systems failure and requires high complexity decision making for assessment and support, frequent evaluation and titration of therapies, application of advanced monitoring technologies and extensive interpretation of multiple databases. Critical Care Time devoted to patient care services described in this note independent of APP/resident time (if applicable)  is 34 minutes.   Erskine Emery MD [facility] 05/19/2020 10:56 AM Personal pager: [phone] If unanswered, please page CCM On-call:  [phone]

## 2020-05-19 NOTE — Consult Note (Signed)
[facility] Nurse Consult Note: Patient receiving care in [place].  Day and night shift RNs helped turn the patient for wound assessment. Reason for Consult: "sacral wound at the coccyx" Wound type: The apex of the gluteal fold has a 0.8 cm x 0.2 cm fissure with a pink wound bed related to MASD, IAD and ITD. On the upper left buttock adjacent to the gluteal fold there is what appears to be a wound caused from MASD and friction/shear.  I am labeling this an abrasion.  The patient has been to OR, and transferred across surfaces during her hospitalization.  This wound measures 0.8 cm x 1.8 cm x 0.2 cm.  The wound bed is pink. Pressure Injury POA: Yes/No/NA Measurement: Wound bed: Drainage (amount, consistency, odor) none from either wound Periwound: intact Dressing procedure/placement/frequency: Wash the fissure at the top of the gluteal fold with soap and water. Pat dry. Place a narrow piece of Aquacel Kellie Simmering [phone]) into the fissure, cover with a foam dressing. Change daily and prn. For the left buttock abrasion, wash with soap and water, pat dry. Place a small piece of Xeroform gauze Kellie Simmering [phone]) over the wound, then a foam dressing.  Change daily and prn. Monitor the wound area(s) for worsening of condition such as: Signs/symptoms of infection,  Increase in size,  Development of or worsening of odor, Development of pain, or increased pain at the affected locations.  Notify the medical team if any of these develop.  Thank you for the consult. [facility] nurse will not follow at this time.  Please re-consult the [facility] team if needed.  Val Riles, RN, MSN, CWOCN, CNS-BC, pager [phone]

## 2020-05-19 NOTE — Progress Notes (Signed)
Physical Therapy Treatment Patient Details Name: Regina Richards MRN: [id number] DOB: [birth year] Today's Date: 05/19/2020    History of Present Illness Pt is 74 yo female with PMH including anxiety, HTN, and kidney stones.  Pt presented with [facility] for 3-4 weeks.  She was found to have a hgb of 2.9 in the ED.  Pt admitted with symptomatic anemia and pancytopenia.  Pt had  colonscopy which showed partially obstructing sigmoid colon mass that was biopsied.  Pt is s/p L hemicolectomy , mobilization of splenic flexure, and liver biopsy on 05/16/2020. Pt underwent  ex-lap with abdominal washout and transverse loop colostomy on 9/20. Pt found to have large volume pneumoperitoneum during operation on 9/20.     PT Comments    PT performs re-evaluation as pt underwent colostomy on 9/20 and remained intubated after procedure until 05/19/2020. Pt is lethargic intermittently during session and limited by abdominal pain and weakness. Pt requires significant assistance to perform all functional mobility tasks at this time and is unable to stand despite significant physical assistance from PT. Pt will benefit from continued acute PT POC to improve mobility quality, strength, and endurance. PT continues to recommend SNF placement at the time of discharge.   Follow Up Recommendations  Supervision/Assistance - 24 hour;SNF     Equipment Recommendations  Wheelchair (measurements PT);Wheelchair cushion (measurements PT);Hospital bed (mechanical lift)    Recommendations for Other Services       Precautions / Restrictions Precautions Precautions: Fall Precaution Comments: colostomy, abdominal binder, JP drain, NG tube Restrictions Weight Bearing Restrictions: No    Mobility  Bed Mobility Overal bed mobility: Needs Assistance Bed Mobility: Supine to Sit;Sit to Supine     Supine to sit: Max assist;HOB elevated Sit to supine: Max assist;HOB elevated      Transfers Overall transfer level: Needs  assistance Equipment used: 1 person hand held assist Transfers: Sit to/from Stand Sit to Stand: Total assist         General transfer comment: unable to complete stand, minimal clearance of buttocks, strong posterior lean  Ambulation/Gait                 Stairs             Wheelchair Mobility    Modified Rankin (Stroke Patients Only)       Balance Overall balance assessment: Needs assistance Sitting-balance support: Bilateral upper extremity supported;Feet unsupported Sitting balance-Leahy Scale: Poor Sitting balance - Comments: modA with BUE support of bed Postural control: Left lateral lean                                  Cognition Arousal/Alertness: Lethargic Behavior During Therapy: Flat affect Overall Cognitive Status: Impaired/Different from baseline Area of Impairment: Orientation;Attention;Memory;Safety/judgement;Following commands;Awareness;Problem solving                 Orientation Level: Disoriented to;Situation Current Attention Level: Sustained Memory: Decreased recall of precautions;Decreased short-term memory Following Commands: Follows one step commands with increased time Safety/Judgement: Decreased awareness of safety;Decreased awareness of deficits Awareness: Intellectual Problem Solving: Slow processing;Requires verbal cues;Requires tactile cues;Decreased initiation        Exercises      General Comments General comments (skin integrity, edema, etc.): pt with drainage noted on gown, RN made aware, RN applies dressing pads during session      Pertinent Vitals/Pain Pain Assessment: Faces Faces Pain Scale: Hurts even more Pain Location: abdomen Pain Descriptors /  Indicators: Grimacing Pain Intervention(s): Monitored during session    Home Living                      Prior Function            PT Goals (current goals can now be found in the care plan section) Acute Rehab PT Goals Patient  Stated Goal: to improve mobility PT Goal Formulation: With patient Time For Goal Achievement: 06/02/20 Potential to Achieve Goals: Fair Progress towards PT goals: Goals downgraded-see care plan    Frequency    Min 2X/week      PT Plan Current plan remains appropriate    Co-evaluation              AM-PAC PT "6 Clicks" Mobility   Outcome Measure  Help needed turning from your back to your side while in a flat bed without using bedrails?: Total Help needed moving from lying on your back to sitting on the side of a flat bed without using bedrails?: Total Help needed moving to and from a bed to a chair (including a wheelchair)?: Total Help needed standing up from a chair using your arms (e.g., wheelchair or bedside chair)?: Total Help needed to walk in hospital room?: Total Help needed climbing 3-5 steps with a railing? : Total 6 Click Score: 6    End of Session Equipment Utilized During Treatment: Oxygen Activity Tolerance: Patient limited by pain;Patient limited by lethargy Patient left: in bed;with call bell/phone within reach;with bed alarm set Nurse Communication: Mobility status;Need for lift equipment PT Visit Diagnosis: Other abnormalities of gait and mobility (R26.89);Muscle weakness (generalized) (M62.81)     Time: 8768-1157 PT Time Calculation (min) (ACUTE ONLY): 26 min  Charges:                        Zenaida Niece, PT, DPT Acute Rehabilitation Pager: [phone]    Zenaida Niece 05/19/2020, 5:27 PM

## 2020-05-19 NOTE — Progress Notes (Signed)
[facility] Progress Note  2 Days Post-Op  Subjective: Patient awake and alert on the vent. Denies abdominal pain. No output from stoma yet.   Objective: Vital signs in last 24 hours: Temp:  [96.8 F (36 C)-98.4 F (36.9 C)] 96.8 F (36 C) (09/22 0700) Pulse Rate:  [42-82] 50 (09/22 0700) Resp:  [13-21] 16 (09/22 0700) BP: (103-156)/(41-60) 121/51 (09/22 0700) SpO2:  [94 %-100 %] 100 % (09/22 0757) Arterial Line BP: (108-190)/(28-54) 157/45 (09/22 0200) FiO2 (%):  [40 %] 40 % (09/22 0757) Weight:  [80.9 kg] 80.9 kg (09/22 0400) Last BM Date: 05/16/20  Intake/Output from previous day: 09/21 0701 - 09/22 0700 In: 3817.3 [I.V.:2603.1; Blood:315; NG/GT:30; IV Piggyback:869.2] Out: 2880 [Urine:1295; Emesis/NG output:25; Drains:1560] Intake/Output this shift: No intake/output data recorded.  PE: General: WD,overweight female who ison the vent but appears comfortable Heart: regular, rate, and rhythm.  Lungs: vent Abd: soft,non-tender,distended,midline wound dressed, stoma pink with red rubber bridge present, JP in RLQ with bloody fluid Skin: warm and dry with no masses, lesions, or rashes   Lab Results:  Recent Labs    05/18/20 0027 05/18/20 0900 05/18/20 1109 05/19/20 0734  WBC 16.0*  --   --  10.5  HGB 6.8*   < > 8.8* 6.6*  HCT 23.8*   < > 26.0* 21.1*  PLT 219  --   --  167   < > = values in this interval not displayed.   BMET Recent Labs    05/18/20 1600 05/19/20 0540  NA 137 139  K 3.5 3.2*  CL 104 101  CO2 22 25  GLUCOSE 208* 156*  BUN 70* 66*  CREATININE 2.75* 2.57*  CALCIUM 8.4* 8.5*   PT/INR Recent Labs    04/28/2020 2249  LABPROT 20.7*  INR 1.9*   CMP     Component Value Date/Time   NA 139 05/19/2020 0540   K 3.2 (L) 05/19/2020 0540   CL 101 05/19/2020 0540   CO2 25 05/19/2020 0540   GLUCOSE 156 (H) 05/19/2020 0540   BUN 66 (H) 05/19/2020 0540   CREATININE 2.57 (H) 05/19/2020 0540   CALCIUM 8.5 (L) 05/19/2020 0540    PROT 5.6 (L) 05/15/2020 0102   ALBUMIN 3.4 (L) 05/19/2020 0540   AST 31 05/15/2020 0102   ALT 26 05/15/2020 0102   ALKPHOS 106 05/15/2020 0102   BILITOT 1.1 05/15/2020 0102   GFRNONAA 18 (L) 05/19/2020 0540   GFRAA 21 (L) 05/19/2020 0540   Lipase  No results found for: LIPASE     Studies/Results: DG Chest 1 View  Result Date: 05/18/2020 CLINICAL DATA:  Central line placement EXAM: CHEST  1 VIEW COMPARISON:  05/02/2020 FINDINGS: Interval placement of left neck vascular catheter, tip projecting over the superior portion of the SVC. Otherwise unchanged AP portable examination with layering bilateral pleural effusions, endotracheal tube, esophagogastric tube. IMPRESSION: 1. Interval placement of left neck vascular catheter, tip projecting over the superior portion of the SVC. 2.  Otherwise unchanged AP portable examination. Electronically Signed   By: Eddie Candle M.D.   On: 05/18/2020 11:45   DG CHEST PORT 1 VIEW  Result Date: 05/22/2020 CLINICAL DATA:  Intubation EXAM: PORTABLE CHEST 1 VIEW COMPARISON:  Chest x-ray 05/01/2020 FINDINGS: Interval placement of an endotracheal tube with tip terminating 2 cm above the carina. Suggestion of an enteric tube noted coursing below the diaphragm with side port collimated off view. The heart size and mediastinal contours are unchanged. Low lung volumes with compressive  changes bibasilar early. Biapical pleural/pulmonary scarring. No pulmonary edema. Trace left pleural effusion. Possible trace right pleural effusion. No pneumothorax. Suggestion of pneumoperitoneum under the right hemidiaphragm. No acute osseous abnormality. IMPRESSION: 1. Interval placement endotracheal tube with tip terminating 2 cm above the carina. 2. Suggestion of an enteric tube coursing below diaphragm with side port collimated off view. 3. Low lung volumes with bilateral trace pleural effusions, left greater than right. 4. Suggestion of pneumoperitoneum which is consistent with  patient's postsurgical state status post exploratory laparotomy. Electronically Signed   By: Iven Finn M.D.   On: 05/10/2020 23:26   DG Chest Port 1 View  Result Date: 05/15/2020 CLINICAL DATA:  Colon cancer status post recent colectomy, severe abdominal pain, shortness of breath EXAM: PORTABLE CHEST 1 VIEW COMPARISON:  05/09/2020 FINDINGS: Upright frontal view of the chest demonstrates interval development of pneumoperitoneum most pronounced under the right hemidiaphragm. Lung volumes are diminished, with bibasilar atelectasis. Trace left effusion. No pneumothorax. The cardiac silhouette is unremarkable. IMPRESSION: 1. Interval development of pneumoperitoneum, concerning for anastomotic breakdown given recent bowel surgery. 2. Low lung volumes, with bibasilar atelectasis and trace left effusion. Critical Value/emergent results were called by telephone at the time of interpretation on 05/09/2020 at 4:56 pm to provider [facility] , who verbally acknowledged these results. Electronically Signed   By: Randa Ngo M.D.   On: 05/22/2020 16:52   [place] EKG SITE RITE  Result Date: 05/19/2020 If Site Rite image not attached, placement could not be confirmed due to current cardiac rhythm.  [place] EKG SITE RITE  Result Date: 05/18/2020 If Site Rite image not attached, placement could not be confirmed due to current cardiac rhythm.   Anti-infectives: Anti-infectives (From admission, onward)   Start     Dose/Rate Route Frequency Ordered Stop   05/18/20 1600  vancomycin (VANCOREADY) IVPB 750 mg/150 mL        750 mg 150 mL/hr over 60 Minutes Intravenous Every 24 hours 05/20/2020 1609     05/18/20 1600  ceFEPIme (MAXIPIME) 2 g in sodium chloride 0.9 % 100 mL IVPB  Status:  Discontinued        2 g 200 mL/hr over 30 Minutes Intravenous Every 24 hours 05/16/2020 1609 05/09/2020 1610   05/03/2020 2230  metroNIDAZOLE (FLAGYL) IVPB 500 mg        500 mg 100 mL/hr over 60 Minutes Intravenous Every 8 hours 05/21/2020  2223     05/23/2020 1615  ceFEPIme (MAXIPIME) 2 g in sodium chloride 0.9 % 100 mL IVPB        2 g 200 mL/hr over 30 Minutes Intravenous Every 24 hours 05/21/2020 1610     05/06/2020 1545  vancomycin (VANCOREADY) IVPB 1500 mg/300 mL        1,500 mg 150 mL/hr over 120 Minutes Intravenous  Once 05/06/2020 1544 05/18/20 0234   05/16/2020 1545  ceFEPIme (MAXIPIME) 2 g in sodium chloride 0.9 % 100 mL IVPB  Status:  Discontinued        2 g 200 mL/hr over 30 Minutes Intravenous  Once 05/23/2020 1544 05/02/2020 1610   05/01/2020 0600  cefoTEtan (CEFOTAN) 2 g in sodium chloride 0.9 % 100 mL IVPB        2 g 200 mL/hr over 30 Minutes Intravenous On call to O.R. 05/05/20 1533 05/09/2020 1106   05/05/20 1600  amoxicillin (AMOXIL) capsule 500 mg        500 mg Oral Every 12 hours 05/05/20 1447 05/10/20 2159  Assessment/Plan Anxiety Cirrhosis - Child's Pugh B initially, significant ascites intra-op yesterday  ABL Anemia -hgb6.6 this AM, transfuse 2 units PRBC this AM AKI - Cr 2.57 and UOP improving  VDRF - vent per CCM, appreciate assistance Septic shock - on low dose pressor this AM  Colon mass S/P left hemicolectomy and liver biopsy9/9 Dr. Georgette Dover Pneumoperitoneum POD#11 S/p ex-lap, LOA, transverse loop colostomy with drain placement 9/20 Dr. Kae Heller - POD#13/POD#2 - BID wet to dry dressing to midline wound - abdominal binder - drain with bloody fluid - 1560 cc since surgery  - stoma pink with SS fluid in bag, await bowel function - pharmacy consult to start TPN today  - continue abx  FEN -NPO, IVF, NGT to LIWS; start TPN VTE -SQ heparin ID -vanc/cefepime/flagyl  LOS: 19 days    Norm Parcel , [facility] 05/19/2020, 8:12 AM Please see Amion for pager number during day hours 7:00am-4:30pm

## 2020-05-19 NOTE — Progress Notes (Signed)
Patient ID: Regina Richards, female   DOB: 05/17/1946, 74 y.o.   MRN: [id number] S: Per RN she has been having episodes of bradycardia into the 40's when sleeping O:BP (!) 121/51   Pulse (!) 50   Temp (!) 96.8 F (36 C) (Oral)   Resp 16   Ht 5' 4.02" (1.626 m)   Wt 80.9 kg   LMP  (LMP Unknown)   SpO2 100%   BMI 30.60 kg/m   Intake/Output Summary (Last 24 hours) at 05/19/2020 0804 Last data filed at 05/19/2020 0700 Gross per 24 hour  Intake 3380.7 ml  Output 2820 ml  Net 560.7 ml   Intake/Output: I/O last 3 completed shifts: In: 7411.8 [P.O.:10; I.V.:4060; Blood:838; NG/GT:30; IV Piggyback:2473.8] Out: 9622 [WLNLG:9211; Emesis/NG output:25; Drains:2510; Blood:50]  Intake/Output this shift:  No intake/output data recorded. Weight change: 0.7 kg Gen: intubated and appears comfortable CVS: bradycardic at 50 Resp: cta Abd: distended, +BS, colostomy in place Ext: no edema  Recent Labs  Lab 05/13/20 0749 05/14/20 0016 05/15/20 0102 05/15/20 0102 05/16/20 0315 05/16/20 0315 05/15/2020 0046 05/16/2020 2249 05/26/2020 2257 05/18/20 0027 05/18/20 1109 05/18/20 1600 05/19/20 0540  NA  --    < > 130*   < > 132*   < > 136 137 137 137 135 137 139  K  --    < > 4.2   < > 3.5   < > 3.7 4.5 4.1 4.4 3.9 3.5 3.2*  CL  --    < > 98  --  99  --  103 106  --  107  --  104 101  CO2  --    < > 20*  --  20*  --  19* 17*  --  17*  --  22 25  GLUCOSE  --    < > 94  --  78  --  90 107*  --  159*  --  208* 156*  BUN  --    < > 68*  --  74*  --  69* 71*  --  72*  --  70* 66*  CREATININE  --    < > 2.62*  --  2.32*  --  2.25* 2.65*  --  2.80*  --  2.75* 2.57*  ALBUMIN 1.9*  --  2.7*  --   --   --   --   --   --  3.1*  --  3.3* 3.4*  CALCIUM  --    < > 8.7*  --  8.6*  --  9.2 8.3*  --  8.3*  --  8.4* 8.5*  PHOS  --   --   --   --   --   --   --   --   --  7.1*  --  6.3* 5.0*  AST 57*  --  31  --   --   --   --   --   --   --   --   --   --   ALT 31  --  26  --   --   --   --   --   --   --   --   --    --    < > = values in this interval not displayed.   Liver Function Tests: Recent Labs  Lab 05/13/20 0749 05/13/20 0749 05/15/20 0102 05/15/20 0102 05/18/20 0027 05/18/20 1600 05/19/20 0540  AST 57*  --  31  --   --   --   --  ALT 31  --  26  --   --   --   --   ALKPHOS 184*  --  106  --   --   --   --   BILITOT 0.8  --  1.1  --   --   --   --   PROT 5.4*  --  5.6*  --   --   --   --   ALBUMIN 1.9*   < > 2.7*   < > 3.1* 3.3* 3.4*   < > = values in this interval not displayed.   No results for input(s): LIPASE, AMYLASE in the last 168 hours. Recent Labs  Lab 05/24/2020 1221  AMMONIA 40*   CBC: Recent Labs  Lab 05/15/20 0102 05/15/20 0102 05/16/20 0315 05/16/20 0315 05/04/2020 0046 04/28/2020 0046 05/26/2020 2249 05/04/2020 2257 05/18/20 0027 05/18/20 0900 05/18/20 1109  WBC 13.0*   < > 13.5*   < > 19.5*  --  21.7*  --  16.0*  --   --   HGB 9.6*   < > 9.0*   < > 10.7*   < > 7.3*   < > 6.8* 8.4* 8.8*  HCT 31.4*   < > 30.0*   < > 35.7*   < > 25.6*   < > 23.8* 27.5* 26.0*  MCV 87.7  --  89.3  --  89.3  --  93.4  --  94.4  --   --   PLT 297   < > 286   < > 330  --  281  --  219  --   --    < > = values in this interval not displayed.   Cardiac Enzymes: No results for input(s): CKTOTAL, CKMB, CKMBINDEX, TROPONINI in the last 168 hours. CBG: Recent Labs  Lab 05/18/20 0337 05/18/20 0744 05/18/20 1155 05/18/20 1508 05/18/20 2009  GLUCAP 166* 189* 202* 197* 170*    Iron Studies: No results for input(s): IRON, TIBC, TRANSFERRIN, FERRITIN in the last 72 hours. Studies/Results: DG Chest 1 View  Result Date: 05/18/2020 CLINICAL DATA:  Central line placement EXAM: CHEST  1 VIEW COMPARISON:  05/08/2020 FINDINGS: Interval placement of left neck vascular catheter, tip projecting over the superior portion of the SVC. Otherwise unchanged AP portable examination with layering bilateral pleural effusions, endotracheal tube, esophagogastric tube. IMPRESSION: 1. Interval placement of  left neck vascular catheter, tip projecting over the superior portion of the SVC. 2.  Otherwise unchanged AP portable examination. Electronically Signed   By: Eddie Candle M.D.   On: 05/18/2020 11:45   DG CHEST PORT 1 VIEW  Result Date: 05/24/2020 CLINICAL DATA:  Intubation EXAM: PORTABLE CHEST 1 VIEW COMPARISON:  Chest x-ray 05/09/2020 FINDINGS: Interval placement of an endotracheal tube with tip terminating 2 cm above the carina. Suggestion of an enteric tube noted coursing below the diaphragm with side port collimated off view. The heart size and mediastinal contours are unchanged. Low lung volumes with compressive changes bibasilar early. Biapical pleural/pulmonary scarring. No pulmonary edema. Trace left pleural effusion. Possible trace right pleural effusion. No pneumothorax. Suggestion of pneumoperitoneum under the right hemidiaphragm. No acute osseous abnormality. IMPRESSION: 1. Interval placement endotracheal tube with tip terminating 2 cm above the carina. 2. Suggestion of an enteric tube coursing below diaphragm with side port collimated off view. 3. Low lung volumes with bilateral trace pleural effusions, left greater than right. 4. Suggestion of pneumoperitoneum which is consistent with patient's postsurgical state status post exploratory laparotomy.  Electronically Signed   By: Iven Finn M.D.   On: 05/13/2020 23:26   DG Chest Port 1 View  Result Date: 05/11/2020 CLINICAL DATA:  Colon cancer status post recent colectomy, severe abdominal pain, shortness of breath EXAM: PORTABLE CHEST 1 VIEW COMPARISON:  05/09/2020 FINDINGS: Upright frontal view of the chest demonstrates interval development of pneumoperitoneum most pronounced under the right hemidiaphragm. Lung volumes are diminished, with bibasilar atelectasis. Trace left effusion. No pneumothorax. The cardiac silhouette is unremarkable. IMPRESSION: 1. Interval development of pneumoperitoneum, concerning for anastomotic breakdown given  recent bowel surgery. 2. Low lung volumes, with bibasilar atelectasis and trace left effusion. Critical Value/emergent results were called by telephone at the time of interpretation on 05/19/2020 at 4:56 pm to provider [facility] , who verbally acknowledged these results. Electronically Signed   By: Randa Ngo M.D.   On: 05/13/2020 16:52   [place] EKG SITE RITE  Result Date: 05/18/2020 If Site Rite image not attached, placement could not be confirmed due to current cardiac rhythm.  . sodium chloride   Intravenous Once  . chlorhexidine gluconate (MEDLINE KIT)  15 mL Mouth Rinse BID  . Chlorhexidine Gluconate Cloth  6 each Topical Daily  . Gerhardt's butt cream   Topical BID  . heparin injection (subcutaneous)  5,000 Units Subcutaneous Q8H  . mouth rinse  15 mL Mouth Rinse 10 times per day  . pantoprazole (PROTONIX) IV  40 mg Intravenous Q24H    BMET    Component Value Date/Time   NA 139 05/19/2020 0540   K 3.2 (L) 05/19/2020 0540   CL 101 05/19/2020 0540   CO2 25 05/19/2020 0540   GLUCOSE 156 (H) 05/19/2020 0540   BUN 66 (H) 05/19/2020 0540   CREATININE 2.57 (H) 05/19/2020 0540   CALCIUM 8.5 (L) 05/19/2020 0540   GFRNONAA 18 (L) 05/19/2020 0540   GFRAA 21 (L) 05/19/2020 0540   CBC    Component Value Date/Time   WBC 16.0 (H) 05/18/2020 0027   RBC 2.52 (L) 05/18/2020 0027   HGB 8.8 (L) 05/18/2020 1109   HCT 26.0 (L) 05/18/2020 1109   PLT 219 05/18/2020 0027   MCV 94.4 05/18/2020 0027   MCH 27.0 05/18/2020 0027   MCHC 28.6 (L) 05/18/2020 0027   RDW 26.5 (H) 05/18/2020 0027   LYMPHSABS 0.3 (L) 05/10/2020 1344   MONOABS 0.9 05/10/2020 1344   EOSABS 0.0 05/10/2020 1344   BASOSABS 0.0 05/10/2020 1344    Assessment/Plan:  1. AKI- likely multifactorial related to transient hypovolemia/hypotensive following bowel resection as well as 3rd spacing due to hypoalbuminemia. Low Urine sodium consistent with either pre-renal or hepatorenal syndrome. Renal US with left renal  atrophy. 1. Initially worsenined after surgery and shock requiring pressors but overnight had increased UOP and improved Scr. 2. Avoid npehrotoxins and hypotension.  3. No indications for RRT at this time but will continue to follow. 4. Continue IV albumin and follow UOP. 2. Anastomotic leak post left hemicolectomy with pneumoperitoneum- s/p ex-lap with LOA and transverse loop colostomy 9/20 3. VDRF post operatively- now in ICU and managed by PCCM. 4. Hyponatremia- due to 3rd spacing and edema. Improving.  5. Colon cancer- locally invasive s/p resection. Nodes negative. 6. 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 New diagnosis.will need further gi evaluation. 7. ABLA following surgery.  Transfuse and follow H/H.  8. Thrombocytopenia - improved 9. Hypoalbuminemia- likely due to cirrhosis  Donetta Potts, MD [facility] [phone]

## 2020-05-20 ENCOUNTER — Inpatient Hospital Stay (HOSPITAL_COMMUNITY): Payer: Medicare Other

## 2020-05-20 LAB — BPAM RBC
Blood Product Expiration Date: 202110232359
Blood Product Expiration Date: 202110232359
Blood Product Expiration Date: 202110242359
Blood Product Expiration Date: 202110242359
ISSUE DATE / TIME: 202109210152
ISSUE DATE / TIME: 202109210517
ISSUE DATE / TIME: 202109220902
ISSUE DATE / TIME: 202109221225
Unit Type and Rh: 5100
Unit Type and Rh: 5100
Unit Type and Rh: 5100
Unit Type and Rh: 5100

## 2020-05-20 LAB — GLUCOSE, CAPILLARY
Glucose-Capillary: 100 mg/dL — ABNORMAL HIGH (ref 70–99)
Glucose-Capillary: 97 mg/dL (ref 70–99)
Glucose-Capillary: 80 mg/dL (ref 70–99)
Glucose-Capillary: 119 mg/dL — ABNORMAL HIGH (ref 70–99)
Glucose-Capillary: 139 mg/dL — ABNORMAL HIGH (ref 70–99)
Glucose-Capillary: 120 mg/dL — ABNORMAL HIGH (ref 70–99)

## 2020-05-20 LAB — PREALBUMIN: Prealbumin: 5.4 mg/dL — ABNORMAL LOW (ref 18–38)

## 2020-05-20 LAB — COMPREHENSIVE METABOLIC PANEL
ALT: 11 U/L (ref 0–44)
AST: 17 U/L (ref 15–41)
Albumin: 3.9 g/dL (ref 3.5–5.0)
Alkaline Phosphatase: 48 U/L (ref 38–126)
Anion gap: 10 (ref 5–15)
BUN: 56 mg/dL — ABNORMAL HIGH (ref 8–23)
CO2: 28 mmol/L (ref 22–32)
Calcium: 8.6 mg/dL — ABNORMAL LOW (ref 8.9–10.3)
Chloride: 105 mmol/L (ref 98–111)
Creatinine, Ser: 1.95 mg/dL — ABNORMAL HIGH (ref 0.44–1.00)
GFR calc Af Amer: 29 mL/min — ABNORMAL LOW (ref >60)
GFR calc non Af Amer: 25 mL/min — ABNORMAL LOW (ref >60)
Glucose, Bld: 120 mg/dL — ABNORMAL HIGH (ref 70–99)
Potassium: 3.7 mmol/L (ref 3.5–5.1)
Sodium: 143 mmol/L (ref 135–145)
Total Bilirubin: 1.3 mg/dL — ABNORMAL HIGH (ref 0.3–1.2)
Total Protein: 5.2 g/dL — ABNORMAL LOW (ref 6.5–8.1)

## 2020-05-20 LAB — CBC
HCT: 30.7 % — ABNORMAL LOW (ref 36.0–46.0)
Hemoglobin: 9.6 g/dL — ABNORMAL LOW (ref 12.0–15.0)
MCH: 29 pg (ref 26.0–34.0)
MCHC: 31.3 g/dL (ref 30.0–36.0)
MCV: 92.7 fL (ref 80.0–100.0)
Platelets: 160 10*3/uL (ref 150–400)
RBC: 3.31 MIL/uL — ABNORMAL LOW (ref 3.87–5.11)
RDW: 19.9 % — ABNORMAL HIGH (ref 11.5–15.5)
WBC: 9.3 10*3/uL (ref 4.0–10.5)
nRBC: 0 % (ref 0.0–0.2)

## 2020-05-20 LAB — TYPE AND SCREEN
ABO/RH(D): O POS
Antibody Screen: NEGATIVE
Unit division: 0
Unit division: 0
Unit division: 0
Unit division: 0

## 2020-05-20 LAB — DIFFERENTIAL
Abs Immature Granulocytes: 0.07 10*3/uL (ref 0.00–0.07)
Basophils Absolute: 0 10*3/uL (ref 0.0–0.1)
Basophils Relative: 0 %
Eosinophils Absolute: 0 10*3/uL (ref 0.0–0.5)
Eosinophils Relative: 0 %
Immature Granulocytes: 1 %
Lymphocytes Relative: 5 %
Lymphs Abs: 0.4 10*3/uL — ABNORMAL LOW (ref 0.7–4.0)
Monocytes Absolute: 0.7 10*3/uL (ref 0.1–1.0)
Monocytes Relative: 8 %
Neutro Abs: 8.1 10*3/uL — ABNORMAL HIGH (ref 1.7–7.7)
Neutrophils Relative %: 86 %

## 2020-05-20 LAB — MAGNESIUM: Magnesium: 2.1 mg/dL (ref 1.7–2.4)

## 2020-05-20 LAB — PHOSPHORUS: Phosphorus: 4.6 mg/dL (ref 2.5–4.6)

## 2020-05-20 LAB — TRIGLYCERIDES: Triglycerides: 60 mg/dL (ref <150)

## 2020-05-20 MED ORDER — HEPARIN SODIUM (PORCINE) 5000 UNIT/ML IJ SOLN
5000.0000 [IU] | Freq: Three times a day (TID) | INTRAMUSCULAR | Status: DC
  Administered 2020-05-20 – 2020-05-26 (×20): 5000 [IU] via SUBCUTANEOUS
  Filled 2020-05-20 (×20): qty 1

## 2020-05-20 MED ORDER — TRAVASOL 10 % IV SOLN
INTRAVENOUS | Status: AC
  Filled 2020-05-20: qty 1203.6

## 2020-05-20 MED ORDER — POTASSIUM CHLORIDE 10 MEQ/50ML IV SOLN
10.0000 meq | INTRAVENOUS | Status: AC
  Administered 2020-05-20 (×4): 10 meq via INTRAVENOUS
  Filled 2020-05-20 (×4): qty 50

## 2020-05-20 MED ORDER — INSULIN ASPART 100 UNIT/ML ~~LOC~~ SOLN
0.0000 [IU] | SUBCUTANEOUS | Status: DC
  Administered 2020-05-20 – 2020-05-22 (×7): 1 [IU] via SUBCUTANEOUS

## 2020-05-20 MED ORDER — HALOPERIDOL LACTATE 5 MG/ML IJ SOLN
5.0000 mg | Freq: Once | INTRAMUSCULAR | Status: AC
  Administered 2020-05-20: 5 mg via INTRAVENOUS
  Filled 2020-05-20: qty 1

## 2020-05-20 NOTE — Progress Notes (Signed)
Transferred -in from [place] by bed awake and alert., NGT to left  nare intact  Connected to low  suction  with small light green output.

## 2020-05-20 NOTE — Progress Notes (Signed)
Patient ID: Regina Richards, female   DOB: Jan 08, 1946, 74 y.o.   MRN: [id number] S:Extubated last night and has been stable since.   O:BP 132/64    Pulse 77    Temp 97.8 F (36.6 C) (Oral)    Resp 12    Ht 5' 4.02" (1.626 m)    Wt 81.8 kg    LMP  (LMP Unknown)    SpO2 99%    BMI 30.94 kg/m   Intake/Output Summary (Last 24 hours) at 05/20/2020 0911 Last data filed at 05/20/2020 0700 Gross per 24 hour  Intake 2234.43 ml  Output 2115 ml  Net 119.43 ml   Intake/Output: I/O last 3 completed shifts: In: 5441.9 [I.V.:2942.5; Blood:630; IV Piggyback:1869.3] Out: 4268 [Urine:2495; Emesis/NG output:25; Drains:1470; Stool:50]  Intake/Output this shift:  No intake/output data recorded. Weight change: 0 kg Gen: NAD, able to say her name but not answer questions CVS: RRR Resp: CTA Abd: +ostomy and drain in place  Ext: trace edema  Recent Labs  Lab 05/15/20 0102 05/15/20 0102 05/16/20 0315 05/16/20 0315 05/10/2020 0046 05/25/2020 0046 05/13/2020 2249 05/23/2020 2257 05/18/20 0027 05/18/20 1109 05/18/20 1600 05/19/20 0540 05/20/20 0030  NA 130*   < > 132*   < > 136   < > 137 137 137 135 137 139 143  K 4.2   < > 3.5   < > 3.7   < > 4.5 4.1 4.4 3.9 3.5 3.2* 3.7  CL 98   < > 99  --  103  --  106  --  107  --  104 101 105  CO2 20*   < > 20*  --  19*  --  17*  --  17*  --  22 25 28   GLUCOSE 94   < > 78  --  90  --  107*  --  159*  --  208* 156* 120*  BUN 68*   < > 74*  --  69*  --  71*  --  72*  --  70* 66* 56*  CREATININE 2.62*   < > 2.32*  --  2.25*  --  2.65*  --  2.80*  --  2.75* 2.57* 1.95*  ALBUMIN 2.7*  --   --   --   --   --   --   --  3.1*  --  3.3* 3.4* 3.9  CALCIUM 8.7*   < > 8.6*  --  9.2  --  8.3*  --  8.3*  --  8.4* 8.5* 8.6*  PHOS  --   --   --   --   --   --   --   --  7.1*  --  6.3* 5.0* 4.6  AST 31  --   --   --   --   --   --   --   --   --   --   --  17  ALT 26  --   --   --   --   --   --   --   --   --   --   --  11   < > = values in this interval not displayed.   Liver  Function Tests: Recent Labs  Lab 05/15/20 0102 05/18/20 0027 05/18/20 1600 05/19/20 0540 05/20/20 0030  AST 31  --   --   --  17  ALT 26  --   --   --  11  ALKPHOS 106  --   --   --  48  BILITOT 1.1  --   --   --  1.3*  PROT 5.6*  --   --   --  5.2*  ALBUMIN 2.7*   < > 3.3* 3.4* 3.9   < > = values in this interval not displayed.   No results for input(s): LIPASE, AMYLASE in the last 168 hours. Recent Labs  Lab 05/26/2020 1221  AMMONIA 40*   CBC: Recent Labs  Lab 05/05/2020 2249 04/29/2020 2257 05/18/20 0027 05/18/20 0900 05/19/20 0734 05/19/20 1711 05/20/20 0030  WBC 21.7*   < > 16.0*  --  10.5 13.7* 9.3  NEUTROABS  --   --   --   --   --   --  8.1*  HGB 7.3*   < > 6.8*   < > 6.6* 9.5* 9.6*  HCT 25.6*   < > 23.8*   < > 21.1* 30.9* 30.7*  MCV 93.4  --  94.4  --  88.3 90.9 92.7  PLT 281   < > 219  --  167 172 160   < > = values in this interval not displayed.   Cardiac Enzymes: No results for input(s): CKTOTAL, CKMB, CKMBINDEX, TROPONINI in the last 168 hours. CBG: Recent Labs  Lab 05/19/20 1550 05/19/20 1957 05/19/20 2332 05/20/20 0413 05/20/20 0758  GLUCAP 111* 131* 102* 100* 97    Iron Studies: No results for input(s): IRON, TIBC, TRANSFERRIN, FERRITIN in the last 72 hours. Studies/Results: DG Chest 1 View  Result Date: 05/18/2020 CLINICAL DATA:  Central line placement EXAM: CHEST  1 VIEW COMPARISON:  05/26/2020 FINDINGS: Interval placement of left neck vascular catheter, tip projecting over the superior portion of the SVC. Otherwise unchanged AP portable examination with layering bilateral pleural effusions, endotracheal tube, esophagogastric tube. IMPRESSION: 1. Interval placement of left neck vascular catheter, tip projecting over the superior portion of the SVC. 2.  Otherwise unchanged AP portable examination. Electronically Signed   By: Eddie Candle M.D.   On: 05/18/2020 11:45   [place] EKG SITE RITE  Result Date: 05/19/2020 If Site Rite image not attached,  placement could not be confirmed due to current cardiac rhythm.   sodium chloride   Intravenous Once   Chlorhexidine Gluconate Cloth  6 each Topical Daily   Gerhardt's butt cream   Topical BID   insulin aspart  0-15 Units Subcutaneous Q4H   mouth rinse  15 mL Mouth Rinse BID   pantoprazole (PROTONIX) IV  40 mg Intravenous Q24H    BMET    Component Value Date/Time   NA 143 05/20/2020 0030   K 3.7 05/20/2020 0030   CL 105 05/20/2020 0030   CO2 28 05/20/2020 0030   GLUCOSE 120 (H) 05/20/2020 0030   BUN 56 (H) 05/20/2020 0030   CREATININE 1.95 (H) 05/20/2020 0030   CALCIUM 8.6 (L) 05/20/2020 0030   GFRNONAA 25 (L) 05/20/2020 0030   GFRAA 29 (L) 05/20/2020 0030   CBC    Component Value Date/Time   WBC 9.3 05/20/2020 0030   RBC 3.31 (L) 05/20/2020 0030   HGB 9.6 (L) 05/20/2020 0030   HCT 30.7 (L) 05/20/2020 0030   PLT 160 05/20/2020 0030   MCV 92.7 05/20/2020 0030   MCH 29.0 05/20/2020 0030   MCHC 31.3 05/20/2020 0030   RDW 19.9 (H) 05/20/2020 0030   LYMPHSABS 0.4 (L) 05/20/2020 0030   MONOABS 0.7 05/20/2020 0030   EOSABS 0.0 05/20/2020 0030  BASOSABS 0.0 05/20/2020 0030    Assessment/Plan:  1. AKI- likely multifactorial related to transient hypovolemia/hypotensive following bowel resection as well as 3rd spacing due to hypoalbuminemia. Low Urine sodium consistent with either pre-renal or hepatorenal syndrome.Renal US with left renal atrophy. 1. Initially worsenined after surgery and shock requiring pressors but has continued to improve with UOP and Scr. 2. Avoid npehrotoxins and hypotension.  3. No indications for RRT at this time. 4. Continue IV albumin and follow UOP. 5. Nothing further to add.  Will sign off.  Please call with questions or concerns.  No need for outpatient follow up if her Scr returns to baseline after discharge.  2. Anastomotic leak post left hemicolectomy with pneumoperitoneum- s/p ex-lap with LOA and transverse loop colostomy  9/20 3. VDRF post operatively- now in ICU and managed by PCCM. 4. Hyponatremia- due to 3rd spacing and edema. Improving.  5. Colon cancer- locally invasive s/p resection. Nodes negative. 6. 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 New diagnosis.will need further gi evaluation. 7. ABLA following surgery.Transfuse and follow H/H. 8. Thrombocytopenia - improved 9. Hypoalbuminemia- likely due to cirrhosis  Donetta Potts, MD [facility] [phone]

## 2020-05-20 NOTE — Progress Notes (Signed)
Regina Richards, Regina Richards Rate:  [56-87] 77 (09/23 0700) Resp:  [9-17] 12 (09/23 0700) BP: (108-138)/(47-64) 132/64 (09/23 0700) SpO2:  [94 %-100 %] 99 % (09/23 0700) FiO2 (%):  [36 %-40 %] 36 % (09/22 1104) Weight:  [80.9 kg-81.8 kg] 81.8 kg (09/23 0500) Last BM Date: 05/16/20  Intake/Output from previous day: 09/22 0701 - 09/23 0700 In: 2548.5 [I.V.:813.5; Blood:630; IV Piggyback:1105.1] Out: 2415 [YJEHU:3149; Drains:770; Stool:50] Intake/Output this shift: No intake/output data recorded.  PE: General: pleasant, WD, overweight female who is laying in bed in NAD Heart: regular, rate, and rhythm. Palpable radial and pedal pulses bilaterally Lungs: CTAB, no wheezes, rhonchi, or rales noted.  Respiratory effort nonlabored Abd: soft,non-tender,distended,midline wound clean with some pale tissue Regina no sign of infection, stoma pink with red rubber bridge present, JP in RLQ with SS fluid MS: all 4 extremities are symmetrical with no cyanosis, clubbing, or edema. Skin: warm and dry with no masses, lesions, or rashes Psych: A&Ox3 with an appropriate affect.   Lab Results:  Recent Labs    05/19/20 1711 05/20/20 0030  WBC 13.7* 9.3  HGB 9.5* 9.6*  HCT 30.9* 30.7*  PLT 172 160   BMET Recent Labs    05/19/20 0540 05/20/20 0030  NA 139 143  K 3.2* 3.7  CL 101 105  CO2 25 28  GLUCOSE 156* 120*  BUN 66* 56*  CREATININE 2.57* 1.95*  CALCIUM 8.5* 8.6*   PT/INR Recent Labs    05/18/2020 2249 05/19/20 1041  LABPROT 20.7* 18.5*  INR 1.9* 1.6*   CMP     Component Value Date/Time   NA 143 05/20/2020 0030   K 3.7 05/20/2020 0030   CL 105 05/20/2020 0030   CO2 28 05/20/2020 0030    GLUCOSE 120 (H) 05/20/2020 0030   BUN 56 (H) 05/20/2020 0030   CREATININE 1.95 (H) 05/20/2020 0030   CALCIUM 8.6 (L) 05/20/2020 0030   PROT 5.2 (L) 05/20/2020 0030   ALBUMIN 3.9 05/20/2020 0030   AST 17 05/20/2020 0030   ALT 11 05/20/2020 0030   ALKPHOS 48 05/20/2020 0030   BILITOT 1.3 (H) 05/20/2020 0030   GFRNONAA 25 (L) 05/20/2020 0030   GFRAA 29 (L) 05/20/2020 0030   Lipase  No results found for: LIPASE     Studies/Results: DG Chest 1 View  Result Date: 05/18/2020 CLINICAL DATA:  Central line placement EXAM: CHEST  1 VIEW COMPARISON:  05/26/2020 FINDINGS: Interval placement of left neck vascular catheter, tip projecting over the superior portion of the SVC. Otherwise unchanged AP portable examination with layering bilateral pleural effusions, endotracheal tube, esophagogastric tube. IMPRESSION: 1. Interval placement of left neck vascular catheter, tip projecting over the superior portion of the SVC. 2.  Otherwise unchanged AP portable examination. Electronically Signed   By: Eddie Candle M.D.   On: 05/18/2020 11:45   [place] EKG SITE RITE  Result Date: 05/19/2020 If Site Rite image not attached, placement could not be confirmed due to current cardiac rhythm.  [place] EKG SITE RITE  Result Date: 05/18/2020 If Site Rite image not attached, placement could not be confirmed due to current cardiac rhythm.   Anti-infectives: Anti-infectives (From admission, onward)  Start     Dose/Rate Route Frequency Ordered Stop   05/18/20 1600  vancomycin (VANCOREADY) IVPB 750 mg/150 mL  Status:  Discontinued        750 mg 150 mL/hr over 60 Minutes Intravenous Every 24 hours 05/15/2020 1609 05/19/20 1024   05/18/20 1600  ceFEPIme (MAXIPIME) 2 g in sodium chloride 0.9 % 100 mL IVPB  Status:  Discontinued        2 g 200 mL/hr over 30 Minutes Intravenous Every 24 hours 05/12/2020 1609 05/22/2020 1610   04/28/2020 2230  metroNIDAZOLE (FLAGYL) IVPB 500 mg        500 mg 100 mL/hr over 60 Minutes Intravenous  Every 8 hours 05/16/2020 2223     05/20/2020 1615  ceFEPIme (MAXIPIME) 2 g in sodium chloride 0.9 % 100 mL IVPB        2 g 200 mL/hr over 30 Minutes Intravenous Every 24 hours 05/14/2020 1610     04/28/2020 1545  vancomycin (VANCOREADY) IVPB 1500 mg/300 mL        1,500 mg 150 mL/hr over 120 Minutes Intravenous  Once 05/10/2020 1544 05/18/20 0234   05/02/2020 1545  ceFEPIme (MAXIPIME) 2 g in sodium chloride 0.9 % 100 mL IVPB  Status:  Discontinued        2 g 200 mL/hr over 30 Minutes Intravenous  Once 05/25/2020 1544 05/26/2020 1610   05/25/2020 0600  cefoTEtan (CEFOTAN) 2 g in sodium chloride 0.9 % 100 mL IVPB        2 g 200 mL/hr over 30 Minutes Intravenous On call to O.R. 05/05/20 1533 04/29/2020 1106   05/05/20 1600  amoxicillin (AMOXIL) capsule 500 mg        500 mg Oral Every 12 hours 05/05/20 1447 05/10/20 2159       Assessment/Plan Anxiety Cirrhosis - Child's Pugh Binitially, significant ascites intra-op  ABLAnemia -hgb9.6 this Richards, transfuse 2 units PRBC this Richards AKI - Cr1.95 and UOP improving, nephrology following VDRF - extubated yesterday  Septic shock - off pressors this Richards  Colon mass S/P left hemicolectomy and liver biopsy9/9 Dr. Georgette Dover Pneumoperitoneum POD#11 S/p ex-lap, LOA, transverse loop colostomy with drain placement 9/20 Dr. Kae Heller - POD#14/POD#3 - BID wet to dry dressing to midline wound - abdominal binder - drain with bloody fluid - 770 in last 24 hrs - stoma pink with SS fluid in bag, await bowel function - continue TPN, start some clamping today and check residuals - continue abx  FEN -ice chips, IVF, NGT clamping today, TPN VTE -SQ heparin ID -vanc/cefepime/flagyl Foley - remove today   LOS: 20 days    Norm Parcel , [facility] Surgery 05/20/2020, 7:59 Richards Please see Amion for pager number during day hours 7:00am-4:30pm

## 2020-05-20 NOTE — Progress Notes (Signed)
Attempted to get out bed. Pulled out NGT and started saying "I need to go home." redirected but very confused. Bil. mittens applied managed to take it out. MD aware with order. Hadol 5 mg iv given and sitter at bedside.  Continue to monitor. NGT reinserted by another RN tol. well KUB ordered. X-ray dept made aware. Continue to monitor.

## 2020-05-20 NOTE — Progress Notes (Addendum)
05/20/2020  I saw and evaluated the patient. Discussed with resident and agree with resident's findings and plan as documented in the resident's note.  I have seen and evaluated the patient for postoperative respiratory failure.  S: No events overnight. Waxing-waning orientation c/w delirium.  Stable after extubation.  O: Blood pressure 125/61, pulse 75, temperature 97.8 F (36.6 C), temperature source Oral, resp. rate 15, height 5' 4.02" (1.626 m), weight 81.8 kg, SpO2 100 %.  No acute distress. 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 Perforated colon post ex lap/LOA/loop coloscopy 9/20 Post op/ ICU delirium Post op ABLA seems to have settled out AKI from peritonitis/ATN improving with supportive care  P:  - OOB to chair, IS - Appreciate PT/OT input - Awaiting return of bowel function, on TPN - Remove foley, consider diuretics in next day or two if renal function continues to recover - Cefepime/flagyl, duration TBD post peritonitis - Monitor daily H/H - Start heparin for DVT ppx - Okay for progressive, appreciate TRH taking over care    05/20/2020 Erskine Emery MD       NAME:  Regina Richards, MRN:  [id number], DOB:  06-Mar-1946, LOS: 34 ADMISSION DATE:  05/19/2020, CONSULTATION DATE:  05/20/2020  REFERRING MD:  Glennon Hamilton sx, CHIEF COMPLAINT: Postop ICU management  Brief History   74 year old woman with 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  PCCM consulted for postop  ICU/vent management  History of present illness   74 yo female with PMH anxiety, hypertension and renal stones presented to the emergency room on 9/3 for shortness of breath x3 to 4 weeks. Patient underwent colonoscopy and EGD on 9/6.  Found to have a sigmoid colon mass, partially obstructing plus multiple polyps. General surgery consulted.  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  Patient underwent a left hemicolectomy as well as a liver biopsy on 9/9.  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   9/20 developed worsening pain, shortness of breath and leukocytosis, chest x-ray showed new pneumoperitoneum.  After discussion with her sisters regarding high peri-operative mortality , she underwent laparotomy, area of anastomotic leak was identified, adhesions were lysed and transverse colostomy was created.  She required 750 cc of albumin and 2 L of crystalloid during the procedure.  No blood was given.  She required Neo-Synephrine during the procedure but was weaned off and transferred to the ICU intubated and sedated on propofol. PCCM consulted for postop ICU management  Past Medical History  Renal calculi New diagnosis of colon cancer and child B cirrhosis during this admission  Significant Hospital Events   9/20 >> pneumoperitoneum  Consults:  General surgery  Procedures:  9/6 colonoscopy 9/9 left hemicolectomy 9/20 exploratory laparotomy, LOA, abdominal washout, transverse loop colostomy, JP drain into pelvis and left colic gutter Intra-Op findings: Large volume pneumoperitoneum, 5 L of ascites with phlegmonous change in the left upper quadrant and around the colonic anastomosis on the left side, extensive inflammatory adhesions  Significant Diagnostic Tests:  CT abdomen/pelvis 9/6 Wall thickening focally involving the distal descending colon, most likely the site of primary. No dominant  sigmoid mass identified. 2. No findings of metastatic disease within the abdomen or pelvis. 3. Bilateral pleural effusions and ascites 4. Hepatic morphology which suggests cirrhosis.  5. Bilateral nephrolithiasis.  US renal 9/14 >> no hydronephrosis, left renal atrophy, multiple small bilateral calculi, small ascites  Micro Data:  Peritoneal fluid 9/20>> [facility] 9/20 >> BC 9/12 >> ng Urine 9/5 >> Enterococcus faecalis  Antimicrobials:  Cefepime 9/20>> Flagyl 9/20>> Vancomycin 9/20 >> 9/21  Interim history/subjective:  Extubated yesterday, satting 97% on 4L [place].  Received 2 units pRBCs yesterday, Hgb stable overnight.  Started on TPN yesterday.  Overall, much improved from yesterday. Oriented only to person, likely from ICU delirium so continue monitoring mental status. Will transfer to progressive care unit for neurological reasons.  Objective   Blood pressure 132/64, pulse 77, temperature 97.8 F (36.6 C), temperature source Oral, resp. rate 12, height 5' 4.02" (1.626 m), weight 81.8 kg, SpO2 99 %.    FiO2 (%):  [36 %] 36 %   Intake/Output Summary (Last 24 hours) at 05/20/2020 0941 Last data filed at 05/20/2020 0700 Gross per 24 hour  Intake 2234.43 ml  Output 2115 ml  Net 119.43 ml   Filed Weights   05/19/20 0400 05/19/20 1122 05/20/20 0500  Weight: 80.9 kg 80.9 kg 81.8 kg    Examination: General: ill-appearing elderly female, awake, in NAD. HENT: mucous membranes pink and moist. Lungs: CTAB, satting well on 4L [place]. CV: regular rate and normal rhythm, + systolic ejection murmur. Abdomen: Soft, non-tender, slightly distended. Stoma pink, JP in RLQ with serosanguinous fluid. Extremities: cool and dry, minimal edema. Neuro: AAOx1, oriented only to person. Follows commands.   Cr continuing to improve, but not back at baseline (baseline ~0.75 - 0.85) K repleted, continue to monitor Hgb stable, started heparin ppx so monitor Hgb and platelets. Plts okay WBC  improved  Resolved Hospital Problem list     Assessment & Plan:    Anastomotic leak post left hemicolectomy and liver biopsy on 9/9 Pneumoperitoneum S/p ex-lap, LOA, transverse loop colostomy 9/20 Cirrhosis, newly diagnosed, MELD score 17 Post-op respiratory failure- improved SBP AKI, improving- related to fluid shifts and distributive shock from peritonitis Acute blood loss anemia s/p ex-lap, stable Metabolic acidosis- resolved ICU delirium   - transfer to progressive care unit for neurological monitoring, closely monitor neuro status - Extubated, satting well on 4L [place], continue IS - Drain care, abx, TPN per surgery - Check INR, temp, goal to prevent acidemia, coagulopathy, and hypothermia to reduce risks of post op bleeding - Monitor renal function closely - progressive mobility   Best practice:  Diet: TPN Pain/Anxiety/Delirium protocol (if indicated): wean VAP protocol (if indicated): N/A DVT prophylaxis: heparin [place] 5000 units TID GI prophylaxis: Protonix Glucose control: N/A Mobility: progressive mobility Code Status: Full code Family Communication: Per surgery Disposition: transfer to progressive care unit   Virl Axe, MD 05/20/2020, 9:41 AM

## 2020-05-20 NOTE — Progress Notes (Signed)
Pharmacy Antibiotic Note  Kristyne Woodring Citron is a 74 y.o. female admitted on 05/26/2020 with SOB and syncope, symptomatic iron deficiency anemia.    Pt with AKI, likely multifactorial; renal function improving over past 3 days. Today, WBC normal no fevers noted. Pharmacy has been consulted for cefepime dosing.  WBC 9, afebrile, SCr 1.9, CrCl ~30 ml/min    Plan: Cefepime 2 gm IV Q 24 hrs Metronidazole 500 mg IV Q 8 hrs Monitor WBC, temp, clinical improvement, cultures, renal function, vancomycin levels  Height: 5' 4.02" (162.6 cm) Weight: 81.8 kg (180 lb 5.4 oz) IBW/kg (Calculated) : 54.74  Temp (24hrs), Avg:97.7 F (36.5 C), Min:97.5 F (36.4 C), Max:97.9 F (36.6 C)  Recent Labs  Lab 05/06/2020 0046 05/06/2020 1221 05/15/2020 1844 05/06/2020 2249 05/18/20 0027 05/18/20 1600 05/19/20 0540 05/19/20 0734 05/19/20 1711 05/20/20 0030  WBC   < >  --   --  21.7* 16.0*  --   --  10.5 13.7* 9.3  CREATININE   < >  --   --  2.65* 2.80* 2.75* 2.57*  --   --  1.95*  LATICACIDVEN  --  2.5* 2.6* 2.6*  --   --   --   --   --   --    < > = values in this interval not displayed.    Estimated Creatinine Clearance: 26.6 mL/min (A) (by C-G formula based on SCr of 1.95 mg/dL (H)).    Allergies  Allergen Reactions  . Sulfa Antibiotics Rash    Rash on legs    Antimicrobials this admission: 9/8 amoxicillin >> 9/13 9/9 cefotetan X 1 9/20 cefepime >> 9/20 vancomycin>>9/22 9/20 metronidazole >>  Microbiology results: 9/12 BCx X 2: NG/final 9/20 BCx X 2: ngtd 9/5 UCx: Enterococcus faecalis, pan sensitive (treated with amoxillin) 9/4 MRSA PCR: negative 9/7 Hepatitis A, B, C panel: negative 9/3 COVID: negative  Thank you for allowing pharmacy to be a part of this patient's care.  Erin Hearing PharmD., BCPS Clinical Pharmacist 05/20/2020 11:11 AM

## 2020-05-20 NOTE — Progress Notes (Signed)
Wasted 50 mcg of fentanyl with Sharlee Blew in the stericycle.

## 2020-05-20 NOTE — Progress Notes (Addendum)
PHARMACY - TOTAL PARENTERAL NUTRITION CONSULT NOTE  Indication: Postoperative ileus  Patient Measurements: Height: 5' 4.02" (162.6 cm) Weight: 80.9 kg (178 lb 5.6 oz) IBW/kg (Calculated) : 54.74 TPN AdjBW (KG): 61.1 Body mass index is 30.6 kg/m. EDW = 73-74 kg  Assessment:  31 YOF admitted 9/3 due to severe iron deficiency, thrombocytopenia, and cirrhosis. Colonoscopy on 9/6 positive for sigmoid mass; CT and biopsy supports diagnosis of colon cancer. Hemicolectomy performed 9/9. After patient's surgery on 9/6, dietary tolerance improved from clear liquid to soft diet on 9/13. Patient was tolerating soft diet until patient developed pneumoperineum requiring ex lap and loop colostomy with drain placement on 9/20. Due to postoperative ileus pharmacy consulted to start TPN.    Glucose / Insulin: CBG <180. 2 units mSSI/24hrs.  Electrolytes: K 3.7 (ileus goal >/=4) post 8 runs. Phos 4.6 high normal. CoCa 8.7 low.  Renal: AKI- baseline ~1.0 Scr 1.95 (down), BUN 56 (down) LFTs / TGs: LFTs / TG WNL, Tbili 1.3 high.  Prealbumin / albumin: albumin 3.9; prealbumin N/A Intake / Output; MIVF: UOP 0.9 mL/kg/hr, drains 737mL/24hrs, ostomy 85mL/24hrs LBM 9/19, net +12.5L, NGT clamping starting today GI Imaging: None since TPN start Surgeries / Procedures: None since TPN start  Central access: CVC placed 05/18/20 TPN start date: 05/19/20  Nutritional Goals (per RD rec 9/22): kCal: 1950-2150, Protein: 100-125g, Fluid: >/= 1.8L  Current Nutrition:  TPN  Plan:  Increase TPN to goal rate of 12mL/hr at 1800 providing 120g AA, 275g CHO, 61g ILE, and total kcal of 2030; meeting 100% of the patients needs.  Electrolytes in TPN: Increase K to 2mEq/L, Ca 39mEq/L, Mag 28mEq/L, decrease to 32mEq/L of Na, and add 33mmol/L of Phos. Cl:Ac ratio 1:1 KCl x4 runs Add standard MVI and trace elements to TPN, add thiamine 100mg  and folic acid 1mg  d/t hx EtOH use Reduce SSI to sensitive Q4H F/U morning labs  Redgie Grayer, PharmD Candidate 05/20/2020,7:05 AM

## 2020-05-21 ENCOUNTER — Encounter (HOSPITAL_COMMUNITY): Payer: Self-pay | Admitting: Surgery

## 2020-05-21 DIAGNOSIS — N179 Acute kidney failure, unspecified: Secondary | ICD-10-CM | POA: Diagnosis not present

## 2020-05-21 LAB — RENAL FUNCTION PANEL
Albumin: 3.7 g/dL (ref 3.5–5.0)
Anion gap: 9 (ref 5–15)
BUN: 47 mg/dL — ABNORMAL HIGH (ref 8–23)
CO2: 29 mmol/L (ref 22–32)
Calcium: 8.8 mg/dL — ABNORMAL LOW (ref 8.9–10.3)
Chloride: 107 mmol/L (ref 98–111)
Creatinine, Ser: 1.49 mg/dL — ABNORMAL HIGH (ref 0.44–1.00)
GFR calc Af Amer: 40 mL/min — ABNORMAL LOW (ref >60)
GFR calc non Af Amer: 34 mL/min — ABNORMAL LOW (ref >60)
Glucose, Bld: 139 mg/dL — ABNORMAL HIGH (ref 70–99)
Phosphorus: 2.2 mg/dL — ABNORMAL LOW (ref 2.5–4.6)
Potassium: 4.1 mmol/L (ref 3.5–5.1)
Sodium: 145 mmol/L (ref 135–145)

## 2020-05-21 LAB — CBC
HCT: 32.5 % — ABNORMAL LOW (ref 36.0–46.0)
Hemoglobin: 9.9 g/dL — ABNORMAL LOW (ref 12.0–15.0)
MCH: 28.6 pg (ref 26.0–34.0)
MCHC: 30.5 g/dL (ref 30.0–36.0)
MCV: 93.9 fL (ref 80.0–100.0)
Platelets: 137 10*3/uL — ABNORMAL LOW (ref 150–400)
RBC: 3.46 MIL/uL — ABNORMAL LOW (ref 3.87–5.11)
RDW: 20.3 % — ABNORMAL HIGH (ref 11.5–15.5)
WBC: 10 10*3/uL (ref 4.0–10.5)
nRBC: 0 % (ref 0.0–0.2)

## 2020-05-21 LAB — GLUCOSE, CAPILLARY
Glucose-Capillary: 138 mg/dL — ABNORMAL HIGH (ref 70–99)
Glucose-Capillary: 133 mg/dL — ABNORMAL HIGH (ref 70–99)
Glucose-Capillary: 137 mg/dL — ABNORMAL HIGH (ref 70–99)
Glucose-Capillary: 130 mg/dL — ABNORMAL HIGH (ref 70–99)
Glucose-Capillary: 141 mg/dL — ABNORMAL HIGH (ref 70–99)

## 2020-05-21 LAB — AMMONIA: Ammonia: 35 umol/L (ref 9–35)

## 2020-05-21 MED ORDER — HALOPERIDOL LACTATE 5 MG/ML IJ SOLN
5.0000 mg | Freq: Four times a day (QID) | INTRAMUSCULAR | Status: DC | PRN
  Administered 2020-05-21: 5 mg via INTRAVENOUS
  Filled 2020-05-21: qty 1

## 2020-05-21 MED ORDER — QUETIAPINE FUMARATE 100 MG PO TABS
100.0000 mg | ORAL_TABLET | Freq: Every day | ORAL | Status: DC
  Administered 2020-05-21: 100 mg via ORAL
  Filled 2020-05-21: qty 1

## 2020-05-21 MED ORDER — TRAVASOL 10 % IV SOLN
INTRAVENOUS | Status: AC
  Filled 2020-05-21: qty 1203.6

## 2020-05-21 MED ORDER — HALOPERIDOL LACTATE 5 MG/ML IJ SOLN
5.0000 mg | Freq: Once | INTRAMUSCULAR | Status: AC
  Administered 2020-05-21: 5 mg via INTRAVENOUS
  Filled 2020-05-21: qty 1

## 2020-05-21 MED ORDER — POTASSIUM PHOSPHATES 15 MMOLE/5ML IV SOLN
30.0000 mmol | Freq: Once | INTRAVENOUS | Status: AC
  Administered 2020-05-21: 30 mmol via INTRAVENOUS
  Filled 2020-05-21: qty 10

## 2020-05-21 NOTE — Care Management Important Message (Signed)
Important Message  Patient Details  Name: Regina Richards MRN: [id number] Date of Birth: 20-Mar-1946   Medicare Important Message Given:  Yes     Orbie Pyo 05/21/2020, 2:40 PM

## 2020-05-21 NOTE — Progress Notes (Signed)
PROGRESS NOTE  Regina Richards  DOB: 26-Jan-1946  PCP: Patient, No Pcp Per [facility]:[id number]  DOA: 05/10/2020  LOS: 21 days   Chief Complaint  Patient presents with  . Loss of Consciousness   Brief narrative: Regina Richards is a 74 y.o. female with PMH of HTN, nephrolithiasis, anxiety.  Timeline of events  9/3, admitted with 3 to 4-week history of progressively worsening shortness of breath.  Hemoglobin on admission was noted to be low at 2.7.  9/6, underwent EGD and colonoscopy.  Found to have sigmoid colon mass, partially obstructing plus multiple polyps. 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 General surgery was consulted.   9/9, underwent left hemicolectomy as well as liver biopsy.    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 Negative for lymphovascular or perineural invasion.   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   9/20, developed worsening abdominal pain, shortness of breath and leukocytosis. Chest x-ray showed new pneumoperitoneum. Underwent exploratory laparotomy.  Noted to have large volume pneumoperitoneum, 5 L of ascites with phlegmonous change in the left upper quadrant and around the colonic anastomosis on the left side, extensive inflammatory adhesions. Area of anastomotic leak was identified, adhesions were lysed and transverse colostomy was created.  She required 750 cc of albumin and 2 L of crystalloid during the procedure.  No blood was given.  She required Neo-Synephrine during the procedure but was weaned off and transferred to the ICU intubated and sedated on propofol. PCCM was consulted for postop ICU management.  9/22, extubated.  Awaiting return of bowel function.  Started on TPN  9/24, transfer out of the hospitalist  service  Subjective: Patient was seen and examined this morning. Elderly Caucasian female.  Confused, agitated.  Able to tell her name.  Unable to have any other conversation. RN at bedside.  Apparently patient has been confused for the last 4 to 5 days.  Ammonia level normal.  She required Haldol 5 mg 1 dose yesterday and this morning. No family at bedside.  Assessment/Plan: Postoperative ileus -Hemicolectomy on 9/9, exploratory laparotomy on 9/20. -Currently remains in postoperative ileus.  NG tube removed this morning.  On TPN. -General surgery following. -Renal function and electrolytes being monitored -Recommend out of bed to chair, PT OT eval.  Adenocarcinoma of colon s/p left hemicolectomy  -Colonic mass was identified on colonoscopy.  Underwent hemicolectomy 9/9.  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 Negative for lymphovascular or perineural invasion.  -Oncology evaluation needed after clinical improvement.  Acute metabolic encephalopathy -Patient remains significantly delirious for last 4 to 5 days. -Multifactorial: Sepsis, prolonged ICU stay, use of fentanyl as needed, liver cirrhosis, chronic small vessel ischemic disease, underlying anxiety disorder.  Ammonia level remains normal. 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 -Currently requiring sitter and as needed Haldol. -Minimize the use pain medicine.  Stop fentanyl.  Oxycodone as needed. -Continue to monitor mental status change.  Acute on chronic blood loss anemia Iron deficiency anemia -On admission, hemoglobin was low at 2.7, ferritin was low at 5 due to chronic GI loss from.  -Patient had intraoperative blood loss as well. -During this hospitalization, received a total of 1 unit of FFP and 8 units of PRBCs, last  on 9/22. -Once infection improved, IV iron can be considered. Recent Labs    05/18/20 1109 05/19/20 0734 05/19/20 1711 05/20/20 0030 05/21/20 0337  HGB 8.8* 6.6* 9.5* 9.6* 9.9*   Acute kidney injury -Creatinine normal at baseline -Running high this admission, peaked at 2.8 on 9/21.  Improving now.  Continue to monitor. -Patient desperately getting bilaterally.  May benefit from diuresis once renal function improves. Recent Labs    05/14/20 0016 05/15/20 0102 05/16/20 0315 05/08/2020 0046 04/30/2020 2249 05/18/20 0027 05/18/20 1600 05/19/20 0540 05/20/20 0030 05/21/20 0337  BUN 55* 68* 74* 69* 71* 72* 70* 66* 56* 47*  CREATININE 2.33* 2.62* 2.32* 2.25* 2.65* 2.80* 2.75* 2.57* 1.95* 1.49*   Hypokalemia/hypophosphatemia -Phosphorus level low at 2.2 today.  IV replacement ordered. -Continue to monitor. Recent Labs  Lab 05/13/2020 2249 05/12/2020 2257 05/18/20 0027 05/18/20 0027 05/18/20 1109 05/18/20 1600 05/19/20 0540 05/20/20 0030 05/21/20 0337  K 4.5   < > 4.4   < > 3.9 3.5 3.2* 3.7 4.1  MG 2.2  --  2.3  --   --   --   --  2.1  --   PHOS  --   --  7.1*  --   --  6.3* 5.0* 4.6 2.2*   < > = values in this interval not displayed.   Septic shock secondary to peritonitis -Briefly required pressors. Currently blood pressure stable without pressors. -Remains on IV cefepime and IV Flagyl.  Duration to be determined.  Postop respiratory failure -Intubated 9/20, extubated 9/22. -Currently on supplement oxygen by nasal cannula.  Liver cirrhosis -Noted on liver biopsy 9/9.  Likely related to Regina Richards -Enzymes normal.  Ammonia level normal. -however, she was noted to have significant ascites Intra-op on 9/20. -Needs GI follow-up as an outpatient.   History of nephrolithiasis US renal 9/14 >> no hydronephrosis, left renal atrophy, multiple small bilateral calculi, small ascites  Enterococcus faecalis UTI 9/5   Mobility: PT eval Code Status:   Code Status: Full Code   Nutritional status: Body mass index is 30.33 kg/m. Nutrition Problem: Inadequate oral intake Etiology: altered GI function Signs/Symptoms: NPO status Diet Order            Diet NPO time specified Except for: Sips with Meds, Ice Chips  Diet effective now                 DVT prophylaxis: heparin injection 5,000 Units Start: 05/20/20 0930 SCDs Start: 05/09/2020 1738   Antimicrobials:  IV cefepime, IV Flagyl Fluid: TPN Consultants: General surgery, critical care Family Communication:  Not at bedside  Status is: Inpatient  Remains inpatient appropriate because:Hemodynamically unstable, Altered mental status and IV treatments appropriate due to intensity of illness or inability to take PO   Dispo: The patient is from: Home              Anticipated d/c is to: Unclear at this time              Anticipated d/c date is: > 3 days              Patient currently is not medically stable to d/c.       Infusions:  . sodium chloride 20 mL/hr at 05/13/2020 2359  . sodium chloride Stopped (05/18/20 2218)  . ceFEPime (MAXIPIME) IV 2 g (05/20/20 1624)  . metronidazole 500 mg (05/21/20 0540)  . norepinephrine (LEVOPHED) Adult infusion Stopped (05/19/20 1758)  . potassium PHOSPHATE IVPB (in mmol) 30 mmol (05/21/20 0939)  .  TPN ADULT (ION) 85 mL/hr at 05/20/20 1900  . TPN ADULT (ION)      Scheduled Meds: . sodium chloride   Intravenous Once  . Chlorhexidine Gluconate Cloth  6 each Topical Daily  . Gerhardt's butt cream   Topical BID  . heparin injection (subcutaneous)  5,000 Units Subcutaneous Q8H  . insulin aspart  0-9 Units Subcutaneous Q4H  . mouth rinse  15 mL Mouth Rinse BID  . pantoprazole (PROTONIX) IV  40 mg Intravenous Q24H    Antimicrobials: Anti-infectives (From admission, onward)   Start     Dose/Rate Route Frequency Ordered Stop   05/18/20 1600  vancomycin (VANCOREADY) IVPB 750 mg/150 mL  Status:  Discontinued        750 mg 150 mL/hr over 60 Minutes Intravenous  Every 24 hours 05/18/2020 1609 05/19/20 1024   05/18/20 1600  ceFEPIme (MAXIPIME) 2 g in sodium chloride 0.9 % 100 mL IVPB  Status:  Discontinued        2 g 200 mL/hr over 30 Minutes Intravenous Every 24 hours 05/16/2020 1609 05/19/2020 1610   05/13/2020 2230  metroNIDAZOLE (FLAGYL) IVPB 500 mg        500 mg 100 mL/hr over 60 Minutes Intravenous Every 8 hours 05/03/2020 2223     05/26/2020 1615  ceFEPIme (MAXIPIME) 2 g in sodium chloride 0.9 % 100 mL IVPB        2 g 200 mL/hr over 30 Minutes Intravenous Every 24 hours 05/21/2020 1610     05/16/2020 1545  vancomycin (VANCOREADY) IVPB 1500 mg/300 mL        1,500 mg 150 mL/hr over 120 Minutes Intravenous  Once 05/06/2020 1544 05/18/20 0234   05/25/2020 1545  ceFEPIme (MAXIPIME) 2 g in sodium chloride 0.9 % 100 mL IVPB  Status:  Discontinued        2 g 200 mL/hr over 30 Minutes Intravenous  Once 04/30/2020 1544 04/30/2020 1610   05/23/2020 0600  cefoTEtan (CEFOTAN) 2 g in sodium chloride 0.9 % 100 mL IVPB        2 g 200 mL/hr over 30 Minutes Intravenous On call to O.R. 05/05/20 1533 05/26/2020 1106   05/05/20 1600  amoxicillin (AMOXIL) capsule 500 mg        500 mg Oral Every 12 hours 05/05/20 1447 05/10/20 2159      PRN meds: Place/Maintain arterial line **AND** sodium chloride, acetaminophen **OR** acetaminophen, fentaNYL (SUBLIMAZE) injection, ondansetron **OR** ondansetron (ZOFRAN) IV, oxyCODONE, phenol   Objective: Vitals:   05/21/20 0700 05/21/20 0800  BP: 132/62   Pulse:  77  Resp: 19   Temp: 98.2 F (36.8 C) 98.3 F (36.8 C)  SpO2:  98%    Intake/Output Summary (Last 24 hours) at 05/21/2020 1053 Last data filed at 05/21/2020 1000 Gross per 24 hour  Intake 1359.98 ml  Output 2346 ml  Net -986.02 ml   Filed Weights   05/19/20 1122 05/20/20 0500 05/21/20 0543  Weight: 80.9 kg 81.8 kg 80.2 kg   Weight change: -0.7 kg Body mass index is 30.33 kg/m.   Physical Exam: General exam:  Restless, has mittens on Skin: No rashes, lesions or  ulcers. HEENT: Atraumatic, normocephalic, supple neck, no obvious bleeding Lungs: Clear to auscultation bilaterally CVS: Regular rate and rhythm, no murmur GI/Abd distended, bowel sounds not heard.  JP drain in place.  Abdominal binder in place. CNS: Able to tell her name.  Restless, spontaneous movement of extremities seen.  Unable to answer any other question. Psychiatry:  Anxious Extremities: 1+ pedal edema bilaterally  Data Review: I have personally reviewed the laboratory data and studies available.  Recent Labs  Lab 05/18/20 0027 05/18/20 0900 05/18/20 1109 05/19/20 0734 05/19/20 1711 05/20/20 0030 05/21/20 0337  WBC 16.0*  --   --  10.5 13.7* 9.3 10.0  NEUTROABS  --   --   --   --   --  8.1*  --   HGB 6.8*   < > 8.8* 6.6* 9.5* 9.6* 9.9*  HCT 23.8*   < > 26.0* 21.1* 30.9* 30.7* 32.5*  MCV 94.4  --   --  88.3 90.9 92.7 93.9  PLT 219  --   --  167 172 160 137*   < > = values in this interval not displayed.   Recent Labs  Lab 05/12/2020 2249 05/02/2020 2257 05/18/20 0027 05/18/20 0027 05/18/20 1109 05/18/20 1600 05/19/20 0540 05/20/20 0030 05/21/20 0337  NA 137   < > 137   < > 135 137 139 143 145  K 4.5   < > 4.4   < > 3.9 3.5 3.2* 3.7 4.1  CL 106   < > 107  --   --  104 101 105 107  CO2 17*   < > 17*  --   --  22 25 28 29   GLUCOSE 107*   < > 159*  --   --  208* 156* 120* 139*  BUN 71*   < > 72*  --   --  70* 66* 56* 47*  CREATININE 2.65*   < > 2.80*  --   --  2.75* 2.57* 1.95* 1.49*  CALCIUM 8.3*   < > 8.3*  --   --  8.4* 8.5* 8.6* 8.8*  MG 2.2  --  2.3  --   --   --   --  2.1  --   PHOS  --   --  7.1*  --   --  6.3* 5.0* 4.6 2.2*   < > = values in this interval not displayed.    F/u labs ordered  Signed, Terrilee Croak, MD [facility] 05/21/2020

## 2020-05-21 NOTE — Progress Notes (Signed)
Wet to dry dressing done to mid  abd.incision site. Mod. Serosanguinous discharge noted. Right arm weeping and swollen , elevated with pillow. Wrapped with kerlix.

## 2020-05-21 NOTE — Progress Notes (Signed)
With on and off restlessness,  Unable to change dressing  On her buttocks, non-cooperative. MD aware.

## 2020-05-21 NOTE — Progress Notes (Signed)
NGT removed tolerated well.

## 2020-05-21 NOTE — Progress Notes (Signed)
attempted to get out of bed,trying to pull out iv line. She claimed " want to go home."  MD aware with order. Haldol 5 mg iv given, sitter at bedside. Spoke with sister on the phone. Went to sleep after 1 hour. Continue to monitor.

## 2020-05-21 NOTE — Progress Notes (Signed)
Physical Therapy Treatment Patient Details Name: Regina Richards MRN: [id number] DOB: 1945/09/10 Today's Date: 05/21/2020    History of Present Illness Pt is 74 yo female with PMH including anxiety, HTN, and kidney stones.  Pt presented with [facility] for 3-4 weeks.  She was found to have a hgb of 2.9 in the ED.  Pt admitted with symptomatic anemia and pancytopenia.  Pt had  colonscopy which showed partially obstructing sigmoid colon mass that was biopsied.  Pt is s/p L hemicolectomy , mobilization of splenic flexure, and liver biopsy on 05/11/2020. Pt underwent  ex-lap with abdominal washout and transverse loop colostomy on 9/20. Pt found to have large volume pneumoperitoneum during operation on 9/20.     PT Comments    Pt remains very confused. Only able to get pt to EOB. Heavy posterior lean and too confused to attempt further mobility. Continue to recommend SNF.    Follow Up Recommendations  Supervision/Assistance - 24 hour;SNF     Equipment Recommendations  Wheelchair (measurements PT);Wheelchair cushion (measurements PT);Hospital bed (mechanical lift)    Recommendations for Other Services       Precautions / Restrictions Precautions Precautions: Fall Precaution Comments: colostomy, abdominal binder, JP drain, NG tube    Mobility  Bed Mobility Overal bed mobility: Needs Assistance Bed Mobility: Supine to Sit;Sit to Supine     Supine to sit: +2 for physical assistance;Total assist;HOB elevated Sit to supine: +2 for physical assistance;Total assist;HOB elevated   General bed mobility comments: Assist for all aspects  Transfers                 General transfer comment: Unable to attempt  Ambulation/Gait                 Stairs             Wheelchair Mobility    Modified Rankin (Stroke Patients Only)       Balance Overall balance assessment: Needs assistance Sitting-balance support: Bilateral upper extremity supported;Feet unsupported Sitting  balance-Leahy Scale: Zero Sitting balance - Comments: Sat EOB x 8 minutes with max assist. Frequent cues to bring shoulders forward without results Postural control: Posterior lean                                  Cognition Arousal/Alertness: Awake/alert Behavior During Therapy: Flat affect;Restless Overall Cognitive Status: Impaired/Different from baseline Area of Impairment: Orientation;Attention;Memory;Safety/judgement;Following commands;Awareness;Problem solving                 Orientation Level: Disoriented to;Situation;Place;Time Current Attention Level: Sustained Memory: Decreased recall of precautions;Decreased short-term memory Following Commands: Follows one step commands with increased time;Follows one step commands inconsistently Safety/Judgement: Decreased awareness of safety;Decreased awareness of deficits Awareness: Intellectual Problem Solving: Slow processing;Requires verbal cues;Requires tactile cues;Decreased initiation;Difficulty sequencing General Comments: Pt pulling at mitts on hands      Exercises      General Comments        Pertinent Vitals/Pain Pain Assessment: Faces Faces Pain Scale: Hurts even more Pain Location: abdomen Pain Descriptors / Indicators: Grimacing Pain Intervention(s): Monitored during session;Limited activity within patient's tolerance;Repositioned    Home Living                      Prior Function            PT Goals (current goals can now be found in the care plan section) Acute Rehab  PT Goals Patient Stated Goal: Unable to state Progress towards PT goals: Not progressing toward goals - comment    Frequency    Min 2X/week      PT Plan Current plan remains appropriate    Co-evaluation              AM-PAC PT "6 Clicks" Mobility   Outcome Measure  Help needed turning from your back to your side while in a flat bed without using bedrails?: Total Help needed moving from lying on  your back to sitting on the side of a flat bed without using bedrails?: Total Help needed moving to and from a bed to a chair (including a wheelchair)?: Total Help needed standing up from a chair using your arms (e.g., wheelchair or bedside chair)?: Total Help needed to walk in hospital room?: Total Help needed climbing 3-5 steps with a railing? : Total 6 Click Score: 6    End of Session Equipment Utilized During Treatment: Oxygen Activity Tolerance: Patient limited by fatigue Patient left: in bed;with call bell/phone within reach;with bed alarm set Nurse Communication: Mobility status;Need for lift equipment PT Visit Diagnosis: Other abnormalities of gait and mobility (R26.89);Muscle weakness (generalized) (M62.81)     Time: 3779-3968 PT Time Calculation (min) (ACUTE ONLY): 22 min  Charges:  $Therapeutic Activity: 8-22 mins                     [place] Pager [phone] Office [place] 05/21/2020, 12:34 PM

## 2020-05-21 NOTE — Progress Notes (Signed)
4 Days Post-Op   Subjective/Chief Complaint: No acute events overnight Remains confused   Objective: Vital signs in last 24 hours: Temp:  [97.6 F (36.4 C)-98.9 F (37.2 C)] 98.2 F (36.8 C) (09/24 0700) Pulse Rate:  [75-86] 79 (09/23 1900) Resp:  [13-20] 19 (09/24 0700) BP: (123-154)/(54-73) 132/62 (09/24 0700) SpO2:  [97 %-100 %] 99 % (09/23 1900) Weight:  [80.2 kg] 80.2 kg (09/24 0543) Last BM Date: 05/16/20  Intake/Output from previous day: 09/23 0701 - 09/24 0700 In: 1435.3 [I.V.:825; IV Piggyback:510.3] Out: 2306 [Urine:1501; Emesis/NG output:20; Drains:715; Stool:70] Intake/Output this shift: No intake/output data recorded.  Exam: Awake but confused Abdomen with binder in place, ostomy pink, starting to function  Lab Results:  Recent Labs    05/20/20 0030 05/21/20 0337  WBC 9.3 10.0  HGB 9.6* 9.9*  HCT 30.7* 32.5*  PLT 160 137*   BMET Recent Labs    05/20/20 0030 05/21/20 0337  NA 143 145  K 3.7 4.1  CL 105 107  CO2 28 29  GLUCOSE 120* 139*  BUN 56* 47*  CREATININE 1.95* 1.49*  CALCIUM 8.6* 8.8*   PT/INR Recent Labs    05/19/20 1041  LABPROT 18.5*  INR 1.6*   ABG Recent Labs    05/18/20 1109  PHART 7.332*  HCO3 21.7    Studies/Results: DG Abd 1 View  Result Date: 05/20/2020 CLINICAL DATA:  Evaluate nasogastric tube placement. EXAM: ABDOMEN - 1 VIEW COMPARISON:  May 11, 2020 FINDINGS: Interval insertion of gastric decompression tube with the tip near the gastric antrum. Nonobstructive bowel gas pattern. Similar appearing marked dextroconvex lumbar scoliotic curvature. IMPRESSION: Gastric tube tip and proximal side hole are in the gastric antrum. Electronically Signed   By: Ruthann Cancer MD   On: 05/20/2020 15:45   [place] EKG SITE RITE  Result Date: 05/19/2020 If Site Rite image not attached, placement could not be confirmed due to current cardiac rhythm.   Anti-infectives: Anti-infectives (From admission, onward)   Start      Dose/Rate Route Frequency Ordered Stop   05/18/20 1600  vancomycin (VANCOREADY) IVPB 750 mg/150 mL  Status:  Discontinued        750 mg 150 mL/hr over 60 Minutes Intravenous Every 24 hours 05/11/2020 1609 05/19/20 1024   05/18/20 1600  ceFEPIme (MAXIPIME) 2 g in sodium chloride 0.9 % 100 mL IVPB  Status:  Discontinued        2 g 200 mL/hr over 30 Minutes Intravenous Every 24 hours 05/16/2020 1609 05/14/2020 1610   05/26/2020 2230  metroNIDAZOLE (FLAGYL) IVPB 500 mg        500 mg 100 mL/hr over 60 Minutes Intravenous Every 8 hours 05/13/2020 2223     05/05/2020 1615  ceFEPIme (MAXIPIME) 2 g in sodium chloride 0.9 % 100 mL IVPB        2 g 200 mL/hr over 30 Minutes Intravenous Every 24 hours 05/23/2020 1610     04/29/2020 1545  vancomycin (VANCOREADY) IVPB 1500 mg/300 mL        1,500 mg 150 mL/hr over 120 Minutes Intravenous  Once 05/20/2020 1544 05/18/20 0234   05/22/2020 1545  ceFEPIme (MAXIPIME) 2 g in sodium chloride 0.9 % 100 mL IVPB  Status:  Discontinued        2 g 200 mL/hr over 30 Minutes Intravenous  Once 05/19/2020 1544 05/19/2020 1610   05/17/2020 0600  cefoTEtan (CEFOTAN) 2 g in sodium chloride 0.9 % 100 mL IVPB  2 g 200 mL/hr over 30 Minutes Intravenous On call to O.R. 05/05/20 1533 05/26/2020 1106   05/05/20 1600  amoxicillin (AMOXIL) capsule 500 mg        500 mg Oral Every 12 hours 05/05/20 1447 05/10/20 2159      Assessment/Plan: Anxiety Cirrhosis - Child's Pugh Binitially, significant ascites intra-op yesterday  ABLAnemia -hgb6.6 this AM, transfuse 2 units PRBC this AM AKI - Cr2.57 and UOP improving  VDRF - vent per CCM, appreciate assistance Septic shock - on low dose pressor this AM  Colon mass S/P left hemicolectomy and liver biopsy9/9 Dr. Georgette Dover Pneumoperitoneum POD#11 S/p ex-lap, LOA, transverse loop colostomy with drain placement 9/20 Dr. Kae Heller  POD#14\#3  Will D/C NG.  Keep on ice chips only today Hgb stable post transfusion.  Follow Continue TNA Continue  antibiotics   LOS: 21 days    Coralie Keens 05/21/2020

## 2020-05-21 NOTE — Progress Notes (Signed)
PHARMACY - TOTAL PARENTERAL NUTRITION CONSULT NOTE  Indication: Postoperative ileus  Patient Measurements: Height: 5' 4.02" (162.6 cm) Weight: 80.2 kg (176 lb 12.9 oz) IBW/kg (Calculated) : 54.74 TPN AdjBW (KG): 61.1 Body mass index is 30.33 kg/m. EDW = 73-74 kg  Assessment:  62 YOF admitted 9/3 due to severe iron deficiency, thrombocytopenia, and cirrhosis. Colonoscopy on 9/6 positive for sigmoid mass; CT and biopsy supports diagnosis of colon cancer. Hemicolectomy performed 9/9. After patient's surgery on 9/6, dietary tolerance improved from clear liquid to soft diet on 9/13. Patient was tolerating soft diet until patient developed pneumoperineum requiring ex lap and loop colostomy with drain placement on 9/20. Due to postoperative ileus pharmacy consulted to start TPN.    Glucose / Insulin: CBG <150. 1 unit sSSI/24hrs.  Electrolytes: Na 145 high normal. Phos 2.2 low. CoCa 9.0 low-normal.  Renal: AKI- baseline ~1.0 Scr 1.49 (down), BUN 47 (down) LFTs / TGs: 9/23 labs- LFTs / TG WNL, Tbili 1.3 high.  Prealbumin / albumin: albumin 3.7; prealbumin 5.4 Intake / Output; MIVF: UOP 0.8 mL/kg/hr, drains 743mL/24hrs, ostomy 32mL/24hrs NGT 4mL/24hrs LBM 9/20, net +9.9L GI Imaging: None since TPN start Surgeries / Procedures: None since TPN start  Central access: CVC placed 05/18/20 TPN start date: 05/19/20  Nutritional Goals (per RD rec 9/22): kCal: 1950-2150, Protein: 100-125g, Fluid: >/= 1.8L  Current Nutrition:  TPN  Plan:  Continue TPN at goal rate of 4mL/hr at 1800 providing 120g AA, 275g CHO, 61g ILE, and total kcal of 2030; meeting 100% of the patients needs.  Electrolytes in TPN: Decrease Na to 51mEq/L, increase Phos to 32mmol/L and continue K 59mEq/L, Ca 31mEq/L, Mag 2mEq/L. Cl:Ac ratio 1:1 Add standard MVI and trace elements to TPN, add thiamine 100mg  and folic acid 1mg  d/t hx EtOH use Continue sSSI q4h and adjust as needed.  KPhos 60mmol ordered F/U morning labs, diuretic  initiation  Redgie Grayer, PharmD Candidate 05/21/2020,7:43 AM

## 2020-05-22 DIAGNOSIS — N179 Acute kidney failure, unspecified: Secondary | ICD-10-CM | POA: Diagnosis not present

## 2020-05-22 LAB — GLUCOSE, CAPILLARY
Glucose-Capillary: 115 mg/dL — ABNORMAL HIGH (ref 70–99)
Glucose-Capillary: 123 mg/dL — ABNORMAL HIGH (ref 70–99)

## 2020-05-22 LAB — CBC
HCT: 33.6 % — ABNORMAL LOW (ref 36.0–46.0)
Hemoglobin: 10.3 g/dL — ABNORMAL LOW (ref 12.0–15.0)
MCH: 29 pg (ref 26.0–34.0)
MCHC: 30.7 g/dL (ref 30.0–36.0)
MCV: 94.6 fL (ref 80.0–100.0)
Platelets: 109 10*3/uL — ABNORMAL LOW (ref 150–400)
RBC: 3.55 MIL/uL — ABNORMAL LOW (ref 3.87–5.11)
RDW: 20.4 % — ABNORMAL HIGH (ref 11.5–15.5)
WBC: 10.9 10*3/uL — ABNORMAL HIGH (ref 4.0–10.5)
nRBC: 0 % (ref 0.0–0.2)

## 2020-05-22 LAB — RENAL FUNCTION PANEL
Albumin: 3.3 g/dL — ABNORMAL LOW (ref 3.5–5.0)
Anion gap: 4 — ABNORMAL LOW (ref 5–15)
BUN: 44 mg/dL — ABNORMAL HIGH (ref 8–23)
CO2: 30 mmol/L (ref 22–32)
Calcium: 8.5 mg/dL — ABNORMAL LOW (ref 8.9–10.3)
Chloride: 109 mmol/L (ref 98–111)
Creatinine, Ser: 1.26 mg/dL — ABNORMAL HIGH (ref 0.44–1.00)
GFR calc Af Amer: 49 mL/min — ABNORMAL LOW (ref >60)
GFR calc non Af Amer: 42 mL/min — ABNORMAL LOW (ref >60)
Glucose, Bld: 120 mg/dL — ABNORMAL HIGH (ref 70–99)
Phosphorus: 2.7 mg/dL (ref 2.5–4.6)
Potassium: 4 mmol/L (ref 3.5–5.1)
Sodium: 143 mmol/L (ref 135–145)

## 2020-05-22 LAB — CULTURE, BLOOD (ROUTINE X 2)
Culture: NO GROWTH
Culture: NO GROWTH
Special Requests: ADEQUATE

## 2020-05-22 MED ORDER — QUETIAPINE FUMARATE 100 MG PO TABS
200.0000 mg | ORAL_TABLET | Freq: Every day | ORAL | Status: DC
  Administered 2020-05-22: 200 mg via ORAL
  Filled 2020-05-22: qty 2

## 2020-05-22 MED ORDER — SODIUM CHLORIDE 0.9 % IV SOLN
2.0000 g | Freq: Two times a day (BID) | INTRAVENOUS | Status: DC
  Administered 2020-05-22 – 2020-05-26 (×8): 2 g via INTRAVENOUS
  Filled 2020-05-22 (×8): qty 2

## 2020-05-22 MED ORDER — TRAVASOL 10 % IV SOLN
INTRAVENOUS | Status: AC
  Filled 2020-05-22: qty 1203.6

## 2020-05-22 NOTE — Progress Notes (Signed)
Pharmacy Antibiotic Note  Regina Richards is a 74 y.o. female admitted on 04/29/2020 with SOB and syncope, symptomatic iron deficiency anemia.    Pt with AKI, likely multifactorial; renal function improving. Today, WBC normal no fevers noted. Pharmacy has been consulted for cefepime dosing.  WBC 9, afebrile, SCr 1.9, CrCl ~30 ml/min    Plan: Increase cefepime 2 gm IV Q 12 hrs Metronidazole 500 mg IV Q 8 hrs Monitor WBC, temp, clinical improvement, cultures, renal function, vancomycin levels  Height: 5' 4.02" (162.6 cm) Weight: 78 kg (171 lb 15.3 oz) IBW/kg (Calculated) : 54.74  Temp (24hrs), Avg:98.3 F (36.8 C), Min:97.7 F (36.5 C), Max:98.8 F (37.1 C)  Recent Labs  Lab 05/23/2020 0046 05/15/2020 1221 05/10/2020 1844 04/29/2020 2249 05/18/20 0027 05/18/20 1600 05/19/20 0540 05/19/20 0734 05/19/20 1711 05/20/20 0030 05/21/20 0337 05/22/20 0356  WBC   < >  --   --  21.7*   < >  --   --  10.5 13.7* 9.3 10.0 10.9*  CREATININE   < >  --   --  2.65*   < > 2.75* 2.57*  --   --  1.95* 1.49* 1.26*  LATICACIDVEN  --  2.5* 2.6* 2.6*  --   --   --   --   --   --   --   --    < > = values in this interval not displayed.    Estimated Creatinine Clearance: 40.2 mL/min (A) (by C-G formula based on SCr of 1.26 mg/dL (H)).    Allergies  Allergen Reactions  . Sulfa Antibiotics Rash    Rash on legs    Antimicrobials this admission: 9/8 amoxicillin >> 9/13 9/9 cefotetan X 1 9/20 cefepime >> 9/20 vancomycin>>9/22 9/20 metronidazole >>  Microbiology results: 9/12 BCx X 2: NG/final 9/20 BCx X 2: ngtd 9/5 UCx: Enterococcus faecalis, pan sensitive (treated with amoxillin) 9/4 MRSA PCR: negative 9/7 Hepatitis A, B, C panel: negative 9/3 COVID: negative  Thank you for allowing pharmacy to be a part of this patient's care.  Alfonse Spruce, PharmD PGY2 ID Pharmacy Resident Phone between 7 am - 3:30 pm: [phone]  Please check AMION for all [place] phone numbers After 10:00 PM, call  [place] [phone]  05/22/2020 2:59 PM

## 2020-05-22 NOTE — Progress Notes (Signed)
5 Days Post-Op   Subjective/Chief Complaint: No new events Ostomy with some liquid stool output Still requires sitter   Objective: Vital signs in last 24 hours: Temp:  [97.7 F (36.5 C)-98.4 F (36.9 C)] 98.2 F (36.8 C) (09/25 0800) Pulse Rate:  [63-82] 77 (09/25 0800) Resp:  [14-20] 16 (09/25 0800) BP: (112-157)/(48-75) 133/53 (09/25 0800) SpO2:  [93 %-98 %] 94 % (09/25 0800) Weight:  [78 kg] 78 kg (09/25 0500) Last BM Date: 05/16/20  Intake/Output from previous day: 09/24 0701 - 09/25 0700 In: 2033.9 [I.V.:1563.9; IV Piggyback:270] Out: 2850 [Urine:2550; Drains:280; Stool:20] Intake/Output this shift: Total I/O In: 421 [I.V.:421] Out: 125 [Drains:100; Stool:25]  Drain - thin serous output Ostomy - pink, edematous, brown liquid stool in bag Wound - clean, beginning to granualte  Lab Results:  Recent Labs    05/21/20 0337 05/22/20 0356  WBC 10.0 10.9*  HGB 9.9* 10.3*  HCT 32.5* 33.6*  PLT 137* 109*   BMET Recent Labs    05/21/20 0337 05/22/20 0356  NA 145 143  K 4.1 4.0  CL 107 109  CO2 29 30  GLUCOSE 139* 120*  BUN 47* 44*  CREATININE 1.49* 1.26*  CALCIUM 8.8* 8.5*   PT/INR Recent Labs    05/19/20 1041  LABPROT 18.5*  INR 1.6*   ABG No results for input(s): PHART, HCO3 in the last 72 hours.  Invalid input(s): PCO2, PO2  Studies/Results: DG Abd 1 View  Result Date: 05/20/2020 CLINICAL DATA:  Evaluate nasogastric tube placement. EXAM: ABDOMEN - 1 VIEW COMPARISON:  May 11, 2020 FINDINGS: Interval insertion of gastric decompression tube with the tip near the gastric antrum. Nonobstructive bowel gas pattern. Similar appearing marked dextroconvex lumbar scoliotic curvature. IMPRESSION: Gastric tube tip and proximal side hole are in the gastric antrum. Electronically Signed   By: Ruthann Cancer MD   On: 05/20/2020 15:45    Anti-infectives: Anti-infectives (From admission, onward)   Start     Dose/Rate Route Frequency Ordered Stop    05/18/20 1600  vancomycin (VANCOREADY) IVPB 750 mg/150 mL  Status:  Discontinued        750 mg 150 mL/hr over 60 Minutes Intravenous Every 24 hours 05/19/2020 1609 05/19/20 1024   05/18/20 1600  ceFEPIme (MAXIPIME) 2 g in sodium chloride 0.9 % 100 mL IVPB  Status:  Discontinued        2 g 200 mL/hr over 30 Minutes Intravenous Every 24 hours 05/25/2020 1609 05/11/2020 1610   05/09/2020 2230  metroNIDAZOLE (FLAGYL) IVPB 500 mg        500 mg 100 mL/hr over 60 Minutes Intravenous Every 8 hours 05/22/2020 2223     04/29/2020 1615  ceFEPIme (MAXIPIME) 2 g in sodium chloride 0.9 % 100 mL IVPB        2 g 200 mL/hr over 30 Minutes Intravenous Every 24 hours 05/15/2020 1610     04/30/2020 1545  vancomycin (VANCOREADY) IVPB 1500 mg/300 mL        1,500 mg 150 mL/hr over 120 Minutes Intravenous  Once 05/15/2020 1544 05/18/20 0234   05/01/2020 1545  ceFEPIme (MAXIPIME) 2 g in sodium chloride 0.9 % 100 mL IVPB  Status:  Discontinued        2 g 200 mL/hr over 30 Minutes Intravenous  Once 05/02/2020 1544 05/21/2020 1610   05/05/2020 0600  cefoTEtan (CEFOTAN) 2 g in sodium chloride 0.9 % 100 mL IVPB        2 g 200 mL/hr over 30 Minutes Intravenous  On call to O.R. 05/05/20 1533 05/14/2020 1106   05/05/20 1600  amoxicillin (AMOXIL) capsule 500 mg        500 mg Oral Every 12 hours 05/05/20 1447 05/10/20 2159      Assessment/Plan: Anxiety Cirrhosis - Child's Pugh Binitially, now likely worse; significant ascites intra-op  ABLAnemia -hgb 10.3 after transfusion AKI - Cr down to 1.26   Colon mass S/P left hemicolectomy and liver biopsy9/9 Dr. Georgette Dover Pneumoperitoneum POD#11 S/p ex-lap, LOA, transverse loop colostomy with drain placement 9/20 Dr. Kae Heller  POD#15/4  Clears, if mental status allows Hgb stable post transfusion.  Follow Continue TNA Continue antibiotics  Patient will likely have a long, complicated recovery due to her preexisting cirrhosis.   LOS: 22 days    Regina Richards 05/22/2020

## 2020-05-22 NOTE — Progress Notes (Signed)
PHARMACY - TOTAL PARENTERAL NUTRITION CONSULT NOTE  Indication: Postoperative ileus  Patient Measurements: Height: 5' 4.02" (162.6 cm) Weight: 78 kg (171 lb 15.3 oz) IBW/kg (Calculated) : 54.74 TPN AdjBW (KG): 61.1 Body mass index is 29.5 kg/m. EDW = 73-74 kg  Assessment:  42 YOF admitted 9/3 due to severe iron deficiency, thrombocytopenia, and cirrhosis. Colonoscopy on 9/6 positive for sigmoid mass; CT and biopsy supports diagnosis of colon cancer. Hemicolectomy performed 9/9. After patient's surgery on 9/6, dietary tolerance improved from clear liquid to soft diet on 9/13. Patient was tolerating soft diet until patient developed pneumoperineum requiring ex lap and loop colostomy with drain placement on 9/20. Due to postoperative ileus pharmacy consulted to start TPN.    Glucose / Insulin: no hx DM - CBGs < 180. Required 5 units sSSI. Electrolytes: all WNL (Phos low normal at 2.7 post KPhos 30 mmol) Renal: AKI improving - SCr down 1.26 (BL SCr 1), BUN down to 44 LFTs / TGs: LFTs / TG WNL, tbili 1.3 Prealbumin / albumin: albumin 3.3; prealbumin 5.4 Intake / Output; MIVF: UOP 1.4 mL/kg/hr, drains 382mL, ostomy 58mL, LBM 9/20, net +8.2L (down).  NGT removed 9/24 GI Imaging: None since TPN start Surgeries / Procedures: None since TPN start  Central access: CVC placed 05/18/20 TPN start date: 05/19/20  Nutritional Goals (per RD rec 9/22): kCal: 1950-2150, Protein: 100-125g, Fluid: >/= 1.8L  Current Nutrition:  TPN  Plan:  Continue TPN at goal rate of 85 ml/hr, providing 120g AA, 275g CHO and 61g ILE for a total of 2030 kCal, meeting 100% of the patients needs.  Electrolytes in TPN: Na 33mEq/L, incr Phos to 47mmol/L, K 74mEq/L, Ca 16mEq/L, Mag 62mEq/L. Max CL in order to increase Phos (but is essentially 1:1 Cl:Ac) Add standard MVI and trace elements to TPN Add thiamine 100mg  and folic acid 1mg  d/t hx EtOH use D/C SSI and CBG checks Standard TPN labs on Mon and Thurs, daily Renal  function panel  Laddie Math D. Mina Marble, PharmD, BCPS, [place] 05/22/2020, 8:29 AM

## 2020-05-22 NOTE — Progress Notes (Signed)
PROGRESS NOTE  Regina Richards  DOB: 02/13/1946  PCP: Patient, No Pcp Per ASN:[id number]  DOA: 05/23/2020  LOS: 22 days   Chief Complaint  Patient presents with  . Loss of Consciousness   Brief narrative: NYHLA MOUNTJOY is a 74 y.o. female with PMH of HTN, nephrolithiasis, anxiety.  Timeline of events  9/3, admitted with 3 to 4-week history of progressively worsening shortness of breath.  Hemoglobin on admission was noted to be low at 2.7.  9/6, underwent EGD and colonoscopy.  Found to have sigmoid colon mass, partially obstructing plus multiple polyps. 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 General surgery was consulted.   9/9, underwent left hemicolectomy as well as liver biopsy.    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 Negative for lymphovascular or perineural invasion.   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   9/20, developed worsening abdominal pain, shortness of breath and leukocytosis. Chest x-ray showed new pneumoperitoneum. Underwent exploratory laparotomy.  Noted to have large volume pneumoperitoneum, 5 L of ascites with phlegmonous change in the left upper quadrant and around the colonic anastomosis on the left side, extensive inflammatory adhesions. Area of anastomotic leak was identified, adhesions were lysed and transverse colostomy was created.  She required 750 cc of albumin and 2 L of crystalloid during the procedure.  No blood was given.  She required Neo-Synephrine during the procedure but was weaned off and transferred to the ICU intubated and sedated on propofol. PCCM was consulted for postop ICU management.  9/22, extubated.  Awaiting return of bowel function.  Started on TPN  9/24, transfer out of the hospitalist  service  Subjective: Patient was seen and examined this morning. Under the effect of Haldol that she got earlier. Per RN, patient slept well for few hours after Seroquel last night.  Required Haldol early this morning. She is starting to have stool in her colostomy bag now.  Ongoing TPN  Assessment/Plan: Postoperative ileus -improving -Hemicolectomy on 9/9, exploratory laparotomy on 9/20. -postoperative ileus is improving.  Has fecal contents colostomy bag NG tube removed on 9/24.  Currently on TPN. -General surgery following. -Renal function and electrolytes being monitored -Recommend out of bed to chair, PT OT eval.  Adenocarcinoma of colon s/p left hemicolectomy  -Colonic mass was identified on colonoscopy.  Underwent hemicolectomy 9/9.  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 Negative for lymphovascular or perineural invasion.  -Oncology evaluation needed after clinical improvement.  Acute metabolic encephalopathy -Patient has remained significantly delirious for last 4 to 5 days. -Multifactorial: Sepsis, prolonged ICU stay, use of fentanyl as needed, liver cirrhosis, chronic small vessel ischemic disease, underlying anxiety disorder.  Ammonia level remains normal. 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 -Started on Seroquel 100 mg last night.  We will increase it to 200 mg at night. -Continue sitter and as needed Haldol.  Hoping to minimize need of IV Haldol. -Minimize the use pain medicine.  Fentanyl was stopped yesterday.  Oral oxycodone as needed -Continue to monitor mental status change.  Acute on chronic blood loss anemia Iron deficiency anemia -On admission, hemoglobin was very low at 2.7, ferritin was low at 5 due to chronic GI loss from.  -Patient had intraoperative  blood loss as well. -During this hospitalization, received a total of 1 unit of FFP and 8 units of PRBCs, last on 9/22. -Once infection improved, IV iron can be considered. Recent Labs    05/19/20 0734 05/19/20 1711 05/20/20 0030 05/21/20 0337 05/22/20 0356  HGB 6.6* 9.5* 9.6* 9.9* 10.3*   Recent Labs    05/18/2020 1944 05/01/20 1010 05/21/20 0337 05/22/20 0356  MCV  --    < > 93.9 94.6  FERRITIN 5*  --   --   --   TIBC 514*  --   --   --   IRON 10*  --   --   --   RETICCTPCT 2.7  --   --   --    < > = values in this interval not displayed.   Acute kidney injury -Creatinine normal at baseline -Running high this admission, peaked at 2.8 on 9/21.  Improving now, down to 1.26 today..  Continue to monitor. -Once her mental status and oral intake improves, will consider Lasix. Recent Labs    05/15/20 0102 05/16/20 0315 05/12/2020 0046 05/21/2020 2249 05/18/20 0027 05/18/20 1600 05/19/20 0540 05/20/20 0030 05/21/20 0337 05/22/20 0356  BUN 68* 74* 69* 71* 72* 70* 66* 56* 47* 44*  CREATININE 2.62* 2.32* 2.25* 2.65* 2.80* 2.75* 2.57* 1.95* 1.49* 1.26*   Hypokalemia/hypophosphatemia -Phosphorus level low at 2.2 today.  IV replacement ordered. -Continue to monitor. Recent Labs  Lab 05/25/2020 2249 04/28/2020 2257 05/18/20 0027 05/18/20 1109 05/18/20 1600 05/19/20 0540 05/20/20 0030 05/21/20 0337 05/22/20 0356  K 4.5   < > 4.4   < > 3.5 3.2* 3.7 4.1 4.0  MG 2.2  --  2.3  --   --   --  2.1  --   --   PHOS  --    < > 7.1*  --  6.3* 5.0* 4.6 2.2* 2.7   < > = values in this interval not displayed.   Septic shock secondary to peritonitis -Briefly required pressors. Currently blood pressure stable without pressors. -Remains on IV cefepime and IV Flagyl.  Duration to be determined.  Postop respiratory failure -Intubated 9/20, extubated 9/22. -Currently on supplement oxygen by nasal cannula.  Liver cirrhosis -Noted on liver biopsy 9/9.  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  Ammonia level normal. -however, she was noted to have significant ascites Intra-op on 9/20. -Needs GI follow-up as an outpatient.   History of nephrolithiasis US renal 9/14 >> no hydronephrosis, left renal atrophy, multiple small bilateral calculi, small ascites  Enterococcus faecalis UTI 9/5   Mobility: PT eval Code Status:   Code Status: Full Code  Nutritional status: Body mass index is 29.5 kg/m. Nutrition Problem: Inadequate oral intake Etiology: altered GI function Signs/Symptoms: NPO status Diet Order            Diet clear liquid Room service appropriate? Yes; Fluid consistency: Thin  Diet effective now                 DVT prophylaxis: heparin injection 5,000 Units Start: 05/20/20 0930 SCDs Start: 05/25/2020 1738   Antimicrobials:  IV cefepime, IV Flagyl Fluid: TPN Consultants: General surgery, critical care Family Communication:  Not at bedside  Status is: Inpatient  Remains inpatient appropriate because:Hemodynamically unstable, Altered mental status and IV treatments appropriate due to intensity of illness or inability to take PO   Dispo: The patient is from: Home              Anticipated d/c  is to: Unclear at this time              Anticipated d/c date is: > 3 days              Patient currently is not medically stable to d/c.   Infusions:  . sodium chloride 20 mL/hr at 05/26/2020 2359  . sodium chloride Stopped (05/18/20 2218)  . ceFEPime (MAXIPIME) IV 2 g (05/21/20 1645)  . metronidazole 500 mg (05/22/20 0520)  . TPN ADULT (ION) 85 mL/hr at 05/21/20 1723  . TPN ADULT (ION)      Scheduled Meds: . sodium chloride   Intravenous Once  . Chlorhexidine Gluconate Cloth  6 each Topical Daily  . Gerhardt's butt cream   Topical BID  . heparin injection (subcutaneous)  5,000 Units Subcutaneous Q8H  . mouth rinse  15 mL Mouth Rinse BID  . pantoprazole (PROTONIX) IV  40 mg Intravenous Q24H  . QUEtiapine  200 mg Oral QHS     Antimicrobials: Anti-infectives (From admission, onward)   Start     Dose/Rate Route Frequency Ordered Stop   05/18/20 1600  vancomycin (VANCOREADY) IVPB 750 mg/150 mL  Status:  Discontinued        750 mg 150 mL/hr over 60 Minutes Intravenous Every 24 hours 04/28/2020 1609 05/19/20 1024   05/18/20 1600  ceFEPIme (MAXIPIME) 2 g in sodium chloride 0.9 % 100 mL IVPB  Status:  Discontinued        2 g 200 mL/hr over 30 Minutes Intravenous Every 24 hours 05/16/2020 1609 04/29/2020 1610   05/26/2020 2230  metroNIDAZOLE (FLAGYL) IVPB 500 mg        500 mg 100 mL/hr over 60 Minutes Intravenous Every 8 hours 05/10/2020 2223     05/15/2020 1615  ceFEPIme (MAXIPIME) 2 g in sodium chloride 0.9 % 100 mL IVPB        2 g 200 mL/hr over 30 Minutes Intravenous Every 24 hours 05/16/2020 1610     05/16/2020 1545  vancomycin (VANCOREADY) IVPB 1500 mg/300 mL        1,500 mg 150 mL/hr over 120 Minutes Intravenous  Once 05/16/2020 1544 05/18/20 0234   05/01/2020 1545  ceFEPIme (MAXIPIME) 2 g in sodium chloride 0.9 % 100 mL IVPB  Status:  Discontinued        2 g 200 mL/hr over 30 Minutes Intravenous  Once 05/12/2020 1544 04/29/2020 1610   05/09/2020 0600  cefoTEtan (CEFOTAN) 2 g in sodium chloride 0.9 % 100 mL IVPB        2 g 200 mL/hr over 30 Minutes Intravenous On call to O.R. 05/05/20 1533 05/07/2020 1106   05/05/20 1600  amoxicillin (AMOXIL) capsule 500 mg        500 mg Oral Every 12 hours 05/05/20 1447 05/10/20 2159      PRN meds: Place/Maintain arterial line **AND** sodium chloride, acetaminophen **OR** acetaminophen, haloperidol lactate, ondansetron **OR** ondansetron (ZOFRAN) IV, oxyCODONE, phenol   Objective: Vitals:   05/22/20 0800 05/22/20 1200  BP: (!) 133/53 (!) 144/65  Pulse: 77 79  Resp: 16 18  Temp: 98.2 F (36.8 C) 98.8 F (37.1 C)  SpO2: 94% 95%    Intake/Output Summary (Last 24 hours) at 05/22/2020 1327 Last data filed at 05/22/2020 1200 Gross per 24 hour  Intake 2199.72 ml  Output 2505 ml  Net  -305.28 ml   Filed Weights   05/20/20 0500 05/21/20 0543 05/22/20 0500  Weight: 81.8 kg 80.2 kg 78 kg  Weight change: -2.2 kg Body mass index is 29.5 kg/m.   Physical Exam: General exam: Remains under sedation with IV Haldol used earlier Skin: No rashes, lesions or ulcers. HEENT: Atraumatic, normocephalic, supple neck, no obvious bleeding Lungs: Clear to auscultation bilaterally CVS: Regular rate and rhythm, no murmur GI/Abd distended, bowel sounds sluggish.  JP drain in place.  Abdominal binder in place. CNS: Drowsy from sedation Psychiatry: Drowsy from sedation Extremities: 1+ pedal edema bilaterally  Data Review: I have personally reviewed the laboratory data and studies available.  Recent Labs  Lab 05/19/20 0734 05/19/20 1711 05/20/20 0030 05/21/20 0337 05/22/20 0356  WBC 10.5 13.7* 9.3 10.0 10.9*  NEUTROABS  --   --  8.1*  --   --   HGB 6.6* 9.5* 9.6* 9.9* 10.3*  HCT 21.1* 30.9* 30.7* 32.5* 33.6*  MCV 88.3 90.9 92.7 93.9 94.6  PLT 167 172 160 137* 109*   Recent Labs  Lab 05/07/2020 2249 05/24/2020 2257 05/18/20 0027 05/18/20 1109 05/18/20 1600 05/19/20 0540 05/20/20 0030 05/21/20 0337 05/22/20 0356  NA 137   < > 137   < > 137 139 143 145 143  K 4.5   < > 4.4   < > 3.5 3.2* 3.7 4.1 4.0  CL 106   < > 107  --  104 101 105 107 109  CO2 17*   < > 17*  --  22 25 28 29 30   GLUCOSE 107*   < > 159*  --  208* 156* 120* 139* 120*  BUN 71*   < > 72*  --  70* 66* 56* 47* 44*  CREATININE 2.65*   < > 2.80*  --  2.75* 2.57* 1.95* 1.49* 1.26*  CALCIUM 8.3*   < > 8.3*  --  8.4* 8.5* 8.6* 8.8* 8.5*  MG 2.2  --  2.3  --   --   --  2.1  --   --   PHOS  --    < > 7.1*  --  6.3* 5.0* 4.6 2.2* 2.7   < > = values in this interval not displayed.    F/u labs ordered  Signed, Terrilee Croak, MD [facility] 05/22/2020

## 2020-05-22 NOTE — Plan of Care (Signed)
  Problem: Education: Goal: Knowledge of General Education information will improve Description: Including pain rating scale, medication(s)/side effects and non-pharmacologic comfort measures Outcome: Progressing   Problem: Health Behavior/Discharge Planning: Goal: Ability to manage health-related needs will improve Outcome: Progressing   Problem: Clinical Measurements: Goal: Ability to maintain clinical measurements within normal limits will improve Outcome: Progressing Goal: Will remain free from infection Outcome: Progressing Goal: Diagnostic test results will improve Outcome: Progressing Goal: Respiratory complications will improve Outcome: Progressing Goal: Cardiovascular complication will be avoided Outcome: Progressing   Problem: Activity: Goal: Risk for activity intolerance will decrease Outcome: Progressing   Problem: Nutrition: Goal: Adequate nutrition will be maintained Outcome: Progressing   Problem: Coping: Goal: Level of anxiety will decrease Outcome: Progressing   Problem: Elimination: Goal: Will not experience complications related to bowel motility Outcome: Progressing Goal: Will not experience complications related to urinary retention Outcome: Progressing   Problem: Pain Managment: Goal: General experience of comfort will improve Outcome: Progressing   Problem: Safety: Goal: Ability to remain free from injury will improve Outcome: Progressing   Problem: Skin Integrity: Goal: Risk for impaired skin integrity will decrease Outcome: Progressing   Problem: Education: Goal: Understanding of discharge needs will improve Outcome: Progressing Goal: Verbalization of understanding of the causes of altered bowel function will improve Outcome: Progressing   Problem: Activity: Goal: Ability to tolerate increased activity will improve Outcome: Progressing   Problem: Bowel/Gastric: Goal: Gastrointestinal status for postoperative course will  improve Outcome: Progressing   Problem: Health Behavior/Discharge Planning: Goal: Identification of community resources to assist with postoperative recovery needs will improve Outcome: Progressing   Problem: Nutritional: Goal: Will attain and maintain optimal nutritional status will improve Outcome: Progressing   Problem: Clinical Measurements: Goal: Postoperative complications will be avoided or minimized Outcome: Progressing   Problem: Respiratory: Goal: Respiratory status will improve Outcome: Progressing   Problem: Skin Integrity: Goal: Will show signs of wound healing Outcome: Progressing   

## 2020-05-23 ENCOUNTER — Inpatient Hospital Stay (HOSPITAL_COMMUNITY): Payer: Medicare Other

## 2020-05-23 DIAGNOSIS — N179 Acute kidney failure, unspecified: Secondary | ICD-10-CM | POA: Diagnosis not present

## 2020-05-23 LAB — RENAL FUNCTION PANEL
Albumin: 2.9 g/dL — ABNORMAL LOW (ref 3.5–5.0)
Anion gap: 7 (ref 5–15)
BUN: 44 mg/dL — ABNORMAL HIGH (ref 8–23)
CO2: 28 mmol/L (ref 22–32)
Calcium: 8.2 mg/dL — ABNORMAL LOW (ref 8.9–10.3)
Chloride: 110 mmol/L (ref 98–111)
Creatinine, Ser: 1.06 mg/dL — ABNORMAL HIGH (ref 0.44–1.00)
GFR calc Af Amer: >60 mL/min (ref >60)
GFR calc non Af Amer: 52 mL/min — ABNORMAL LOW (ref >60)
Glucose, Bld: 137 mg/dL — ABNORMAL HIGH (ref 70–99)
Phosphorus: 3.1 mg/dL (ref 2.5–4.6)
Potassium: 3.9 mmol/L (ref 3.5–5.1)
Sodium: 145 mmol/L (ref 135–145)

## 2020-05-23 LAB — BLOOD GAS, ARTERIAL
Acid-Base Excess: 2.5 mmol/L — ABNORMAL HIGH (ref 0.0–2.0)
Acid-Base Excess: 3 mmol/L — ABNORMAL HIGH (ref 0.0–2.0)
Bicarbonate: 30.5 mmol/L — ABNORMAL HIGH (ref 20.0–28.0)
Bicarbonate: 28.1 mmol/L — ABNORMAL HIGH (ref 20.0–28.0)
Drawn by: 53547
Drawn by: 53547
FIO2: 100
FIO2: 40
O2 Saturation: 94.1 %
O2 Saturation: 97.7 %
Patient temperature: 36.6
Patient temperature: 36.6
pCO2 arterial: 86.4 mmHg (ref 32.0–48.0)
pCO2 arterial: 51.2 mmHg — ABNORMAL HIGH (ref 32.0–48.0)
pH, Arterial: 7.171 — CL (ref 7.350–7.450)
pH, Arterial: 7.356 (ref 7.350–7.450)
pO2, Arterial: 85.5 mmHg (ref 83.0–108.0)
pO2, Arterial: 93.4 mmHg (ref 83.0–108.0)

## 2020-05-23 LAB — HEPATIC FUNCTION PANEL
ALT: 16 U/L (ref 0–44)
AST: 29 U/L (ref 15–41)
Albumin: 3 g/dL — ABNORMAL LOW (ref 3.5–5.0)
Alkaline Phosphatase: 85 U/L (ref 38–126)
Bilirubin, Direct: 0.3 mg/dL — ABNORMAL HIGH (ref 0.0–0.2)
Indirect Bilirubin: 0.4 mg/dL (ref 0.3–0.9)
Total Bilirubin: 0.7 mg/dL (ref 0.3–1.2)
Total Protein: 5.3 g/dL — ABNORMAL LOW (ref 6.5–8.1)

## 2020-05-23 LAB — CBC
HCT: 34 % — ABNORMAL LOW (ref 36.0–46.0)
Hemoglobin: 10.1 g/dL — ABNORMAL LOW (ref 12.0–15.0)
MCH: 28.9 pg (ref 26.0–34.0)
MCHC: 29.7 g/dL — ABNORMAL LOW (ref 30.0–36.0)
MCV: 97.4 fL (ref 80.0–100.0)
Platelets: 83 10*3/uL — ABNORMAL LOW (ref 150–400)
RBC: 3.49 MIL/uL — ABNORMAL LOW (ref 3.87–5.11)
RDW: 20.9 % — ABNORMAL HIGH (ref 11.5–15.5)
WBC: 10.6 10*3/uL — ABNORMAL HIGH (ref 4.0–10.5)
nRBC: 0 % (ref 0.0–0.2)

## 2020-05-23 LAB — MAGNESIUM: Magnesium: 1.5 mg/dL — ABNORMAL LOW (ref 1.7–2.4)

## 2020-05-23 LAB — GLUCOSE, CAPILLARY: Glucose-Capillary: 111 mg/dL — ABNORMAL HIGH (ref 70–99)

## 2020-05-23 LAB — AMMONIA: Ammonia: 44 umol/L — ABNORMAL HIGH (ref 9–35)

## 2020-05-23 MED ORDER — MAGNESIUM SULFATE 2 GM/50ML IV SOLN
2.0000 g | Freq: Once | INTRAVENOUS | Status: AC
  Administered 2020-05-23: 2 g via INTRAVENOUS
  Filled 2020-05-23: qty 50

## 2020-05-23 MED ORDER — POTASSIUM CHLORIDE 10 MEQ/50ML IV SOLN
10.0000 meq | Freq: Once | INTRAVENOUS | Status: AC
  Administered 2020-05-23: 10 meq via INTRAVENOUS
  Filled 2020-05-23: qty 50

## 2020-05-23 MED ORDER — TRAVASOL 10 % IV SOLN
INTRAVENOUS | Status: AC
  Filled 2020-05-23: qty 1203.6

## 2020-05-23 NOTE — Progress Notes (Signed)
Patient placed on bipap after critical ABG results. 20/8 60% Rapid RN at bedside. Will get follow up ABG in 2 hours.

## 2020-05-23 NOTE — Progress Notes (Signed)
PHARMACY - TOTAL PARENTERAL NUTRITION CONSULT NOTE  Indication: Postoperative ileus  Patient Measurements: Height: 5' 4.02" (162.6 cm) Weight: 78.6 kg (173 lb 4.5 oz) IBW/kg (Calculated) : 54.74 TPN AdjBW (KG): 61.1 Body mass index is 29.73 kg/m. EDW = 73-74 kg  Assessment:  89 YOF admitted 9/3 due to severe iron deficiency, thrombocytopenia, and cirrhosis. Colonoscopy on 9/6 positive for sigmoid mass; CT and biopsy supports diagnosis of colon cancer. Hemicolectomy performed 9/9. After patient's surgery on 9/6, dietary tolerance improved from clear liquid to soft diet on 9/13. Patient was tolerating soft diet until patient developed pneumoperineum requiring ex lap and loop colostomy with drain placement on 9/20. Due to postoperative ileus pharmacy consulted to start TPN.    Glucose / Insulin: no hx DM - CBGs < 180.  SSI/CBG checks D/C'ed 05/22/20. Electrolytes: K 3.9 (goal >/= 4 for ileus), Mag 1.5 (goal >/= 4 for ileus, 2gm ordered), others WNL Renal: AKI resolving - SCr down 1.06 (BL SCr 1), BUN 44 LFTs / TGs: LFTs / TG WNL, tbili 1.3 Prealbumin / albumin: albumin 3.3; prealbumin 5.4 Intake / Output; MIVF: UOP 1 mL/kg/hr, drains down 268mL, ostomy 51mL, LBM 9/20, net +5.7L (down).  NGT removed 9/24 GI Imaging: None since TPN start Surgeries / Procedures: None since TPN start  Central access: CVC placed 05/18/20 TPN start date: 05/19/20  Nutritional Goals (per RD rec 9/22): kCal: 1950-2150, Protein: 100-125g, Fluid: >/= 1.8L  Current Nutrition:  TPN  Clear liquid diet started 9/25 - no intake charted  Plan:  Continue TPN at goal rate of 85 ml/hr, providing 120g AA, 275g CHO and 61g ILE for a total of 2030 kCal, meeting 100% of the patients needs.  Electrolytes in TPN: Na 50mEq/L, Phos 37mmol/L, incr K 73mEq/L (= 190mEq/d), Ca 4mEq/L, incr Mag 9mEq/L (= 16.59mEq/d = 2gm), max CL (but is essentially 1:1 Cl:Ac) Add standard MVI and trace elements to TPN Add thiamine 100mg  and folic  acid 1mg  d/t hx EtOH use KCL x 1 run Standard TPN labs on Mon and Thurs, daily Renal function panel F/U PO intake/diet advancement  Huong Luthi D. Mina Marble, PharmD, BCPS, [place] 05/23/2020, 7:50 AM

## 2020-05-23 NOTE — Significant Event (Signed)
Rapid Response Event Note   Reason for Call :  Bradycardia and desat  Initial Focused Assessment:  Received a call from charge RN wanting me to lay eyes on the patient.  Patient has been having episodes of bradycardia and her o2 sat keeps dropping down.  Patient is obtunded and minimal response to stimulation including pain.  This is a change for this patient as she was answering questions yesterday.    Update  Patient is currently on bipap.       Interventions:  ABG drawn, bipap ordered (awaiting results of ABG), mg ordered per md, repositioned patient, atropine placed at bedside  Plan of Care:  Currently the patient will remain on unit.  I will continue to round on this patient. Redraw abg at 1100   Event Summary:   MD Notified: Cards and surgical Call Time: 7579 Arrival Time: 7282 End Time: 0900   Venetia Maxon, RN

## 2020-05-23 NOTE — Progress Notes (Signed)
HR was down to 30's then went up 110's -120's then 80's -90's. Frequently changed brady to tachy with multiple PVCs. Mg was 1.5 in am. Dr. Pietro Cassis notified this matter. Patient has workable breathing with gurgling sound. SpO2 down to 70's then get back lower 90's. Patient has response with voice at the beginning of shift, but now only pain response. Paging rapid response nurse and RT. Ordered ABG and Dr. Pietro Cassis ordered EKG and mag 2g IVPB. Administered mag and Dr. Georgette Dover came to check on patient and ordered CXR & ammonia level. CXR was fine, but pH was 7.1, PCO2 was 86.4. Applied BIPAP now. Will check ABG after 2 hrs. Bryson Ha who is patient's sister called and updated. HS [employer]

## 2020-05-23 NOTE — Progress Notes (Addendum)
PROGRESS NOTE  Regina Richards  DOB: 03/27/1946  PCP: Patient, No Pcp Per TKZ:[id number]  DOA: 05/22/2020  LOS: 23 days   Chief Complaint  Patient presents with  . Loss of Consciousness   Brief narrative: Regina Richards is a 74 y.o. female with PMH of HTN, nephrolithiasis, anxiety.  Timeline of events  9/3, admitted with 3 to 4-week history of progressively worsening shortness of breath.  Hemoglobin on admission was noted to be low at 2.7.  9/6, underwent EGD and colonoscopy.  Found to have sigmoid colon mass, partially obstructing plus multiple polyps. 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 General surgery was consulted.   9/9, underwent left hemicolectomy as well as liver biopsy.    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 Negative for lymphovascular or perineural invasion.   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   9/20, developed worsening abdominal pain, shortness of breath and leukocytosis. Chest x-ray showed new pneumoperitoneum. Underwent exploratory laparotomy.  Noted to have large volume pneumoperitoneum, 5 L of ascites with phlegmonous change in the left upper quadrant and around the colonic anastomosis on the left side, extensive inflammatory adhesions. Area of anastomotic leak was identified, adhesions were lysed and transverse colostomy was created.  She required 750 cc of albumin and 2 L of crystalloid during the procedure.  No blood was given.  She required Neo-Synephrine during the procedure but was weaned off and transferred to the ICU intubated and sedated on propofol. PCCM was consulted for postop ICU management.  9/22, extubated.  Awaiting return of bowel function.  Started on TPN  9/24, transfer out of the hospitalist  service  Subjective: Patient was seen and examined this morning. Obtunded.  Grimaces on sternal rub.  Her saturation was dropping so the nursing staff put her on nonrebreather mask. Blood gas shows acute elevation of PCO2 to 86. Patient was placed on BiPAP. Lot of PACs in bedside monitoring.  Assessment/Plan: Acute hypoxic/hypercapnic respiratory failure -Patient had postoperative respiratory failure.  She was intubated 9/20, extubated 9/22.  Since then, she had been able to maintain oxygen saturation on low-flow by nasal cannula.  -However, this morning, patient is obtunded.  Oxygen saturation dropped.  Blood gas was done result as below showing pH of 7.17, PCO2 of 86.  Chest x-ray did not show any infiltrates, effusion or pulmonary edema. -BiPAP has been initiated. -Repeat blood gas next 2 to 3 hours.  If no improvement, patient may need reintubation.    Component Value Date/Time   PHART 7.171 (LL) 05/23/2020 0805   PCO2ART 86.4 (HH) 05/23/2020 0805   PO2ART 85.5 05/23/2020 0805   HCO3 30.5 (H) 05/23/2020 0805   TCO2 23 05/18/2020 1109   ACIDBASEDEF 4.0 (H) 05/18/2020 1109   O2SAT 94.1 05/23/2020 0805   Acute toxic metabolic encephalopathy -Patient has remained intermittently delirious for last 4 to 5 days, multifactorial : sepsis, prolonged ICU stay, liver cirrhosis, chronic small vessel ischemic disease, underlying anxiety disorder.   -9/24, I gave her Seroquel 100 mg at bedtime which did not work for more than few hours.  9/25, I gave her 200 mg Seroquel at bedtime.  I think she might have gotten oversedated leading to CO2 retention and hence she is totally obtunded this morning. -We will avoid any sedatives at this time.  However she may need some dose of Seroquel at  bedtime again.  Will reassess.  Haldol as needed but nursing staff have been suggested to minimize the use. 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 -Continue sitter -Oral oxycodone as needed for pain -Continue to monitor mental status change.  Sinus tachycardia with multiple PVCs -This morning, patient's heart rate is running over 120s with multiple PVCs.  Likely related to CO2 retention.  Potassium normal, magnesium was low at 1.5.  Replacement ordered. -Last echocardiogram from 9/4 showed EF of 60 to 65%. -Continue to monitor.  Postoperative ileus -improving -Hemicolectomy on 9/9, exploratory laparotomy on 9/20. -postoperative ileus is improving.  Has fecal contents colostomy bag NG tube removed on 9/24.  Currently on TPN. -General surgery following. -Renal function and electrolytes being monitored -When able to participate, recommend out of bed to chair, PT OT eval.  Adenocarcinoma of colon s/p left hemicolectomy  -Colonic mass was identified on colonoscopy.  Underwent hemicolectomy 9/9.  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 Negative for lymphovascular or perineural invasion.  -Oncology evaluation needed after clinical improvement.  Acute on chronic blood loss anemia Iron deficiency anemia -On admission, hemoglobin was very low at 2.7, ferritin was low at 5 due to chronic GI loss from.  -Patient had intraoperative blood loss as well. -During this hospitalization, received a total of 1 unit of FFP and 8 units of PRBCs, last on 9/22. -Once infection improved, IV iron can be considered. Recent Labs    05/19/20 1711 05/20/20 0030 05/21/20 0337 05/22/20 0356 05/23/20 0337  HGB 9.5* 9.6* 9.9* 10.3* 10.1*   Recent Labs    05/08/2020 1944 05/01/20 1010 05/22/20 0356 05/23/20 0337  MCV  --    < > 94.6 97.4  FERRITIN 5*  --   --   --   TIBC 514*  --   --   --   IRON 10*  --   --   --   RETICCTPCT 2.7  --   --   --    < > = values in this interval not displayed.   Acute kidney  injury -Creatinine normal at baseline -Running high this admission, peaked at 2.8 on 9/21.  Improving now, down to 1.26 today..  Continue to monitor. -Once her mental status and oral intake improves, will consider Lasix. Recent Labs    05/16/20 0315 04/28/2020 0046 05/05/2020 2249 05/18/20 0027 05/18/20 1600 05/19/20 0540 05/20/20 0030 05/21/20 0337 05/22/20 0356 05/23/20 0337  BUN 74* 69* 71* 72* 70* 66* 56* 47* 44* 44*  CREATININE 2.32* 2.25* 2.65* 2.80* 2.75* 2.57* 1.95* 1.49* 1.26* 1.06*   Hypokalemia/hypomagnesemi/hypophosphatemia -Secondary to poor oral intake, TPN.   -Levels being monitored.  Magnesium low at 1.5 today.   -Continue to monitor. Recent Labs  Lab 05/25/2020 2249 05/09/2020 2257 05/18/20 0027 05/18/20 1109 05/19/20 0540 05/20/20 0030 05/21/20 0337 05/22/20 0356 05/23/20 0337  K 4.5   < > 4.4   < > 3.2* 3.7 4.1 4.0 3.9  MG 2.2  --  2.3  --   --  2.1  --   --  1.5*  PHOS  --   --  7.1*   < > 5.0* 4.6 2.2* 2.7 3.1   < > = values in this interval not displayed.   Septic shock secondary to peritonitis -Briefly required pressors. Currently blood pressure stable without pressors. -Remains on IV cefepime and IV  Flagyl.  Duration to be determined.  Liver cirrhosis -Noted on liver biopsy 9/9.  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  Ammonia level normal. -However, she was noted to have significant ascites Intra-op on 9/20. -Needs GI follow-up as an outpatient.   History of nephrolithiasis US renal 9/14 >> no hydronephrosis, left renal atrophy, multiple small bilateral calculi, small ascites  Enterococcus faecalis UTI 9/5   Mobility: PT eval when able to participate. Code Status:   Code Status: Full Code  Nutritional status: Body mass index is 29.73 kg/m. Nutrition Problem: Inadequate oral intake Etiology: altered GI function Signs/Symptoms: NPO status Diet Order            Diet clear liquid Room service appropriate? Yes; Fluid consistency: Thin  Diet  effective now                 DVT prophylaxis: heparin injection 5,000 Units Start: 05/20/20 0930 SCDs Start: 05/06/2020 1738   Antimicrobials:  IV cefepime, IV Flagyl Fluid: TPN Consultants: General surgery, critical care Family Communication:  called and updated patient's sister Ms. [name] this morning.  Status is: Inpatient  Remains inpatient appropriate because:Hemodynamically unstable, Altered mental status and IV treatments appropriate due to intensity of illness or inability to take PO   Dispo: The patient is from: Home              Anticipated d/c is to: Unclear at this time              Anticipated d/c date is: > 3 days              Patient currently is not medically stable to d/c.   Infusions:  . sodium chloride 20 mL/hr at 05/06/2020 2359  . sodium chloride Stopped (05/18/20 2218)  . ceFEPime (MAXIPIME) IV 2 g (05/23/20 0346)  . metronidazole 500 mg (05/23/20 0545)  . potassium chloride    . TPN ADULT (ION) 85 mL/hr at 05/22/20 1706  . TPN ADULT (ION)      Scheduled Meds: . sodium chloride   Intravenous Once  . Chlorhexidine Gluconate Cloth  6 each Topical Daily  . Gerhardt's butt cream   Topical BID  . heparin injection (subcutaneous)  5,000 Units Subcutaneous Q8H  . mouth rinse  15 mL Mouth Rinse BID  . pantoprazole (PROTONIX) IV  40 mg Intravenous Q24H  . QUEtiapine  200 mg Oral QHS    Antimicrobials: Anti-infectives (From admission, onward)   Start     Dose/Rate Route Frequency Ordered Stop   05/22/20 1600  ceFEPIme (MAXIPIME) 2 g in sodium chloride 0.9 % 100 mL IVPB        2 g 200 mL/hr over 30 Minutes Intravenous Every 12 hours 05/22/20 1459     05/18/20 1600  vancomycin (VANCOREADY) IVPB 750 mg/150 mL  Status:  Discontinued        750 mg 150 mL/hr over 60 Minutes Intravenous Every 24 hours 05/24/2020 1609 05/19/20 1024   05/18/20 1600  ceFEPIme (MAXIPIME) 2 g in sodium chloride 0.9 % 100 mL IVPB  Status:  Discontinued        2 g 200 mL/hr over 30  Minutes Intravenous Every 24 hours 05/22/2020 1609 04/30/2020 1610   05/16/2020 2230  metroNIDAZOLE (FLAGYL) IVPB 500 mg        500 mg 100 mL/hr over 60 Minutes Intravenous Every 8 hours 05/06/2020 2223     05/15/2020 1615  ceFEPIme (MAXIPIME) 2 g in sodium chloride 0.9 % 100  mL IVPB  Status:  Discontinued        2 g 200 mL/hr over 30 Minutes Intravenous Every 24 hours 05/03/2020 1610 05/22/20 1459   05/12/2020 1545  vancomycin (VANCOREADY) IVPB 1500 mg/300 mL        1,500 mg 150 mL/hr over 120 Minutes Intravenous  Once 05/20/2020 1544 05/18/20 0234   05/13/2020 1545  ceFEPIme (MAXIPIME) 2 g in sodium chloride 0.9 % 100 mL IVPB  Status:  Discontinued        2 g 200 mL/hr over 30 Minutes Intravenous  Once 05/18/2020 1544 05/10/2020 1610   05/11/2020 0600  cefoTEtan (CEFOTAN) 2 g in sodium chloride 0.9 % 100 mL IVPB        2 g 200 mL/hr over 30 Minutes Intravenous On call to O.R. 05/05/20 1533 05/15/2020 1106   05/05/20 1600  amoxicillin (AMOXIL) capsule 500 mg        500 mg Oral Every 12 hours 05/05/20 1447 05/10/20 2159      PRN meds: Place/Maintain arterial line **AND** sodium chloride, acetaminophen **OR** acetaminophen, haloperidol lactate, ondansetron **OR** ondansetron (ZOFRAN) IV, oxyCODONE, phenol   Objective: Vitals:   05/23/20 0712 05/23/20 0849  BP: (!) 124/56   Pulse: (!) 106 (!) 128  Resp: 20 (!) 33  Temp: 98 F (36.7 C)   SpO2: 97% 99%    Intake/Output Summary (Last 24 hours) at 05/23/2020 0903 Last data filed at 05/23/2020 0020 Gross per 24 hour  Intake 928 ml  Output 2090 ml  Net -1162 ml   Filed Weights   05/21/20 0543 05/22/20 0500 05/23/20 0500  Weight: 80.2 kg 78 kg 78.6 kg   Weight change: 0.6 kg Body mass index is 29.73 kg/m.   Physical Exam: General exam: Obtunded this morning.  Not in physical distress. Skin: No rashes, lesions or ulcers. HEENT: Atraumatic, normocephalic, supple neck, no obvious bleeding Lungs: Clear to auscultation bilaterally CVS: Tachycardia,  irregular, lot of PVCs in telemetry, no murmur GI/Abd distended, bowel sounds sluggish.  JP drain in place.  Abdominal binder in place. CNS: Totally obtunded this morning Psychiatry: Obtunded Extremities: 1+ pedal edema bilaterally  Data Review: I have personally reviewed the laboratory data and studies available.  Recent Labs  Lab 05/19/20 1711 05/20/20 0030 05/21/20 0337 05/22/20 0356 05/23/20 0337  WBC 13.7* 9.3 10.0 10.9* 10.6*  NEUTROABS  --  8.1*  --   --   --   HGB 9.5* 9.6* 9.9* 10.3* 10.1*  HCT 30.9* 30.7* 32.5* 33.6* 34.0*  MCV 90.9 92.7 93.9 94.6 97.4  PLT 172 160 137* 109* 83*   Recent Labs  Lab 05/05/2020 2249 04/29/2020 2257 05/18/20 0027 05/18/20 1109 05/19/20 0540 05/20/20 0030 05/21/20 0337 05/22/20 0356 05/23/20 0337  NA 137   < > 137   < > 139 143 145 143 145  K 4.5   < > 4.4   < > 3.2* 3.7 4.1 4.0 3.9  CL 106   < > 107   < > 101 105 107 109 110  CO2 17*   < > 17*   < > 25 28 29 30 28   GLUCOSE 107*   < > 159*   < > 156* 120* 139* 120* 137*  BUN 71*   < > 72*   < > 66* 56* 47* 44* 44*  CREATININE 2.65*   < > 2.80*   < > 2.57* 1.95* 1.49* 1.26* 1.06*  CALCIUM 8.3*   < > 8.3*   < >  8.5* 8.6* 8.8* 8.5* 8.2*  MG 2.2  --  2.3  --   --  2.1  --   --  1.5*  PHOS  --   --  7.1*   < > 5.0* 4.6 2.2* 2.7 3.1   < > = values in this interval not displayed.    F/u labs ordered  Signed, Terrilee Croak, MD [facility] 05/23/2020

## 2020-05-23 NOTE — Progress Notes (Signed)
Patient is getting aroused with touch or voice around at 1620. She wanted to be off Bi-pap mask, but patient was not enough to awake. Continue to stay on Bi-pap machine. Let night shift nurse aware of might be off and apply to ventri mask when she is more awake. HS [employer]

## 2020-05-23 NOTE — Progress Notes (Signed)
6 Days Post-Op   Subjective/Chief Complaint: When I arrived at bedside, nurses were with the patient because she was completely obtunded with minimal reaction to stimuli.  Not gagging with suctioning.  Still spontaneously breathing.  Tachycardic to 120's with irregular rhythm and frequent PVC's.    Still with colostomy output  WBC trending down almost to normal.  Cr almost normal.   Objective: Vital signs in last 24 hours: Temp:  [97.7 F (36.5 C)-98.8 F (37.1 C)] 98 F (36.7 C) (09/26 0712) Pulse Rate:  [77-106] 106 (09/26 0712) Resp:  [16-24] 20 (09/26 0712) BP: (105-171)/(51-73) 124/56 (09/26 0712) SpO2:  [90 %-98 %] 97 % (09/26 0712) Weight:  [78.6 kg] 78.6 kg (09/26 0500) Last BM Date: 05/22/20  Intake/Output from previous day: 09/25 0701 - 09/26 0700 In: 2671 [I.V.:1100.8; IV Piggyback:248.1] Out: 2215 [Urine:1950; Drains:240; Stool:25] Intake/Output this shift: No intake/output data recorded.  Patient minimally responsive CV - tachycardic, irregular rhythm Resp - bilateral coarse breath sounds Abd - soft, minimally distended; ostomy pink with stool in bag Wound - clean; granulating; sutures visible    Lab Results:  Recent Labs    05/22/20 0356 05/23/20 0337  WBC 10.9* 10.6*  HGB 10.3* 10.1*  HCT 33.6* 34.0*  PLT 109* 83*   BMET Recent Labs    05/22/20 0356 05/23/20 0337  NA 143 145  K 4.0 3.9  CL 109 110  CO2 30 28  GLUCOSE 120* 137*  BUN 44* 44*  CREATININE 1.26* 1.06*  CALCIUM 8.5* 8.2*   PT/INR No results for input(s): LABPROT, INR in the last 72 hours. ABG No results for input(s): PHART, HCO3 in the last 72 hours.  Invalid input(s): PCO2, PO2  Studies/Results: No results found.  Anti-infectives: Anti-infectives (From admission, onward)   Start     Dose/Rate Route Frequency Ordered Stop   05/22/20 1600  ceFEPIme (MAXIPIME) 2 g in sodium chloride 0.9 % 100 mL IVPB        2 g 200 mL/hr over 30 Minutes Intravenous Every 12 hours  05/22/20 1459     05/18/20 1600  vancomycin (VANCOREADY) IVPB 750 mg/150 mL  Status:  Discontinued        750 mg 150 mL/hr over 60 Minutes Intravenous Every 24 hours 05/24/2020 1609 05/19/20 1024   05/18/20 1600  ceFEPIme (MAXIPIME) 2 g in sodium chloride 0.9 % 100 mL IVPB  Status:  Discontinued        2 g 200 mL/hr over 30 Minutes Intravenous Every 24 hours 05/08/2020 1609 05/24/2020 1610   05/18/2020 2230  metroNIDAZOLE (FLAGYL) IVPB 500 mg        500 mg 100 mL/hr over 60 Minutes Intravenous Every 8 hours 05/13/2020 2223     05/15/2020 1615  ceFEPIme (MAXIPIME) 2 g in sodium chloride 0.9 % 100 mL IVPB  Status:  Discontinued        2 g 200 mL/hr over 30 Minutes Intravenous Every 24 hours 05/19/2020 1610 05/22/20 1459   05/12/2020 1545  vancomycin (VANCOREADY) IVPB 1500 mg/300 mL        1,500 mg 150 mL/hr over 120 Minutes Intravenous  Once 05/12/2020 1544 05/18/20 0234   05/06/2020 1545  ceFEPIme (MAXIPIME) 2 g in sodium chloride 0.9 % 100 mL IVPB  Status:  Discontinued        2 g 200 mL/hr over 30 Minutes Intravenous  Once 05/19/2020 1544 05/22/2020 1610   05/05/2020 0600  cefoTEtan (CEFOTAN) 2 g in sodium chloride 0.9 % 100 mL  IVPB        2 g 200 mL/hr over 30 Minutes Intravenous On call to O.R. 05/05/20 1533 05/13/2020 1106   05/05/20 1600  amoxicillin (AMOXIL) capsule 500 mg        500 mg Oral Every 12 hours 05/05/20 1447 05/10/20 2159      Assessment/Plan: Anxiety Cirrhosis - Child's Pugh Binitially, now likely worse; significant ascites intra-op  ABLAnemia -hgb 10.3 after transfusion AKI - Cr down to 1.26   Colon mass - adenocarcinoma 4.5 cm 0/32 lymph nodes T3,N0 S/P left hemicolectomy and liver biopsy9/9 Dr. Georgette Dover Pneumoperitoneum on POD #11 despite normal bowel function S/p ex-lap, LOA, transverse loop colostomy with drain placement 9/20 Dr. Kae Heller  POD#16/5  Mental status changes/ cardiac rhythm changes - per primary team.  Check LFT's, ammonia level Decreasing WBC - doubt any  intra-abdominal process as etiology  Patient will likely have a long, complicated recovery due to her preexisting cirrhosis.  As previously stated, her cirrhosis has complicated her entire care and makes her high risk for complications from any intervention.    LOS: 23 days    Regina Richards 05/23/2020

## 2020-05-24 DIAGNOSIS — N179 Acute kidney failure, unspecified: Secondary | ICD-10-CM | POA: Diagnosis not present

## 2020-05-24 LAB — DIFFERENTIAL
Abs Immature Granulocytes: 0.24 10*3/uL — ABNORMAL HIGH (ref 0.00–0.07)
Basophils Absolute: 0.1 10*3/uL (ref 0.0–0.1)
Basophils Relative: 0 %
Eosinophils Absolute: 0.2 10*3/uL (ref 0.0–0.5)
Eosinophils Relative: 1 %
Immature Granulocytes: 2 %
Lymphocytes Relative: 5 %
Lymphs Abs: 0.8 10*3/uL (ref 0.7–4.0)
Monocytes Absolute: 1.7 10*3/uL — ABNORMAL HIGH (ref 0.1–1.0)
Monocytes Relative: 11 %
Neutro Abs: 12.5 10*3/uL — ABNORMAL HIGH (ref 1.7–7.7)
Neutrophils Relative %: 81 %

## 2020-05-24 LAB — CBC
HCT: 37.3 % (ref 36.0–46.0)
Hemoglobin: 11 g/dL — ABNORMAL LOW (ref 12.0–15.0)
MCH: 29.3 pg (ref 26.0–34.0)
MCHC: 29.5 g/dL — ABNORMAL LOW (ref 30.0–36.0)
MCV: 99.5 fL (ref 80.0–100.0)
Platelets: 91 10*3/uL — ABNORMAL LOW (ref 150–400)
RBC: 3.75 MIL/uL — ABNORMAL LOW (ref 3.87–5.11)
RDW: 21 % — ABNORMAL HIGH (ref 11.5–15.5)
WBC: 15.4 10*3/uL — ABNORMAL HIGH (ref 4.0–10.5)
nRBC: 0 % (ref 0.0–0.2)

## 2020-05-24 LAB — COMPREHENSIVE METABOLIC PANEL
ALT: 16 U/L (ref 0–44)
AST: 36 U/L (ref 15–41)
Albumin: 3.1 g/dL — ABNORMAL LOW (ref 3.5–5.0)
Alkaline Phosphatase: 123 U/L (ref 38–126)
Anion gap: 6 (ref 5–15)
BUN: 51 mg/dL — ABNORMAL HIGH (ref 8–23)
CO2: 27 mmol/L (ref 22–32)
Calcium: 8.7 mg/dL — ABNORMAL LOW (ref 8.9–10.3)
Chloride: 114 mmol/L — ABNORMAL HIGH (ref 98–111)
Creatinine, Ser: 1.02 mg/dL — ABNORMAL HIGH (ref 0.44–1.00)
GFR calc Af Amer: >60 mL/min (ref >60)
GFR calc non Af Amer: 55 mL/min — ABNORMAL LOW (ref >60)
Glucose, Bld: 121 mg/dL — ABNORMAL HIGH (ref 70–99)
Potassium: 4.6 mmol/L (ref 3.5–5.1)
Sodium: 147 mmol/L — ABNORMAL HIGH (ref 135–145)
Total Bilirubin: 0.8 mg/dL (ref 0.3–1.2)
Total Protein: 5.9 g/dL — ABNORMAL LOW (ref 6.5–8.1)

## 2020-05-24 LAB — PREALBUMIN: Prealbumin: 9.6 mg/dL — ABNORMAL LOW (ref 18–38)

## 2020-05-24 LAB — MAGNESIUM: Magnesium: 2.2 mg/dL (ref 1.7–2.4)

## 2020-05-24 LAB — PHOSPHORUS: Phosphorus: 2.7 mg/dL (ref 2.5–4.6)

## 2020-05-24 LAB — TRIGLYCERIDES: Triglycerides: 47 mg/dL (ref <150)

## 2020-05-24 MED ORDER — LACTULOSE 10 GM/15ML PO SOLN
30.0000 g | Freq: Once | ORAL | Status: DC
  Filled 2020-05-24: qty 45

## 2020-05-24 MED ORDER — TRAVASOL 10 % IV SOLN
INTRAVENOUS | Status: AC
  Filled 2020-05-24: qty 1203.6

## 2020-05-24 NOTE — Progress Notes (Signed)
PROGRESS NOTE  Regina Richards  DOB: 10/28/1945  PCP: Patient, No Pcp Per ZOX:[id number]  DOA: 05/12/2020  LOS: 24 days   Chief Complaint  Patient presents with  . Loss of Consciousness   Brief narrative: Regina Richards is a 74 y.o. female with PMH of HTN, nephrolithiasis, anxiety.  Timeline of events  9/3, admitted with 3 to 4-week history of progressively worsening shortness of breath.  Hemoglobin on admission was noted to be low at 2.7.  9/6, underwent EGD and colonoscopy.  Found to have sigmoid colon mass, partially obstructing plus multiple polyps. 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 General surgery was consulted.   9/9, underwent left hemicolectomy as well as liver biopsy.    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 Negative for lymphovascular or perineural invasion.   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   9/20, developed worsening abdominal pain, shortness of breath and leukocytosis. Chest x-ray showed new pneumoperitoneum. Underwent exploratory laparotomy.  Noted to have large volume pneumoperitoneum, 5 L of ascites with phlegmonous change in the left upper quadrant and around the colonic anastomosis on the left side, extensive inflammatory adhesions. Area of anastomotic leak was identified, adhesions were lysed and transverse colostomy was created.  She required 750 cc of albumin and 2 L of crystalloid during the procedure.  No blood was given.  She required Neo-Synephrine during the procedure but was weaned off and transferred to the ICU intubated and sedated on propofol. PCCM was consulted for postop ICU management.  9/22, extubated.  Awaiting return of bowel function.  Started on TPN  9/24, transfer out of the hospitalist  service  Subjective: Patient was seen and examined this morning. On BiPAP.  Able to open eyes.  Unclear voice because of BiPAP. Later this morning, she was weaned down to 45% Ventimask with oxygen saturation more than 95%. No family at bedside today.  Assessment/Plan: Acute hypoxic/hypercapnic respiratory failure -Patient had postoperative respiratory failure.  She was intubated 9/20, extubated 9/22.  Since then, she had been able to maintain oxygen saturation on low-flow by nasal cannula.  -However, on 9/26 patient was noted to be obtunded.  PCO2 elevated to 86. -BiPAP was initiated.  PCO2 improved to 51. -Weaned down to 45% Ventimask this morning.  Continue to monitor.  Acute toxic metabolic encephalopathy -Currently patient is altered because of elevated CO2 and the effect of Seroquel given to control agitation.  Altered mental status also contributed by sepsis, prolonged ICU stay, liver cirrhosis, chronic small vessel ischemic disease, underlying anxiety disorder.   -Gradual improving mental status after CO2 level is improving. -Avoid sedatives. -Continue sitter -Oral oxycodone as needed for pain -Continue to monitor mental status change.  Postoperative ileus -improving -Hemicolectomy on 9/9, exploratory laparotomy on 9/20. -postoperative ileus is improving.  Has fecal contents colostomy bag NG tube removed on 9/24.  Currently on TPN. -General surgery following. -Renal function and electrolytes being monitored -When able to participate, recommend out of bed to chair, PT OT eval.  Adenocarcinoma of colon s/p left hemicolectomy  -Colonic mass was identified on colonoscopy.  Underwent hemicolectomy 9/9.  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 Negative for lymphovascular or perineural invasion.  -Oncology evaluation needed after clinical  improvement.  Leukocytosis -WBC count worsening. -No fever.  New  systolic ejection murmur on auscultation. -Obtain echocardiogram to rule out vegetation. Recent Labs  Lab 04/30/2020 1844 05/16/2020 2249 05/18/20 0027 05/20/20 0030 05/21/20 0337 05/22/20 0356 05/23/20 0337 05/24/20 0435  WBC  --  21.7*   < > 9.3 10.0 10.9* 10.6* 15.4*  LATICACIDVEN 2.6* 2.6*  --   --   --   --   --   --    < > = values in this interval not displayed.   Acute on chronic blood loss anemia Iron deficiency anemia -On admission, hemoglobin was very low at 2.7, ferritin was low at 5 due to chronic GI loss from.  -Patient had intraoperative blood loss as well. -During this hospitalization, patient received a total of 1 unit of FFP and 8 units of PRBCs, last on 9/22. -Once infection improved, IV iron can be considered. Recent Labs    05/20/20 0030 05/21/20 0337 05/22/20 0356 05/23/20 0337 05/24/20 0435  HGB 9.6* 9.9* 10.3* 10.1* 11.0*   Recent Labs    05/21/2020 1944 05/01/20 1010 05/23/20 0337 05/24/20 0435  MCV  --    < > 97.4 99.5  FERRITIN 5*  --   --   --   TIBC 514*  --   --   --   IRON 10*  --   --   --   RETICCTPCT 2.7  --   --   --    < > = values in this interval not displayed.   Acute kidney injury -Creatinine normal at baseline -Running high this admission, peaked at 2.8 on 9/21.  Improving now, down to 1.02 today..  Continue to monitor. -Once her mental status and oral intake improves, will consider Lasix. Recent Labs    05/13/2020 0046 05/19/2020 2249 05/18/20 0027 05/18/20 1600 05/19/20 0540 05/20/20 0030 05/21/20 0337 05/22/20 0356 05/23/20 0337 05/24/20 0435  BUN 69* 71* 72* 70* 66* 56* 47* 44* 44* 51*  CREATININE 2.25* 2.65* 2.80* 2.75* 2.57* 1.95* 1.49* 1.26* 1.06* 1.02*   Hypernatremia -Sodium level up at 147 today.  Getting TPN.  Not on diuretics.  Renal function is stable. -Has coexisting liver cirrhosis.  Continue to monitor. Recent Labs  Lab 05/20/2020 2257  05/18/20 0027 05/18/20 1109 05/18/20 1600 05/19/20 0540 05/20/20 0030 05/21/20 0337 05/22/20 0356 05/23/20 0337 05/24/20 0435  NA 137 137 135 137 139 143 145 143 145 147*   Hypokalemia/hypomagnesemi/hypophosphatemia -Corrected with replacement. Recent Labs  Lab 05/06/2020 2249 05/16/2020 2257 05/18/20 0027 05/18/20 1109 05/20/20 0030 05/21/20 0337 05/22/20 0356 05/23/20 0337 05/24/20 0435  K 4.5   < > 4.4   < > 3.7 4.1 4.0 3.9 4.6  MG 2.2  --  2.3  --  2.1  --   --  1.5* 2.2  PHOS  --   --  7.1*   < > 4.6 2.2* 2.7 3.1 2.7   < > = values in this interval not displayed.   Septic shock secondary to peritonitis -Briefly required pressors. Currently blood pressure stable without pressors. -Remains on IV cefepime and IV Flagyl.  Duration to be determined.  Liver cirrhosis -Noted on liver biopsy 9/9.  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  Ammonia level normal. -However, she was noted to have significant ascites Intra-op on 9/20. -Needs GI follow-up as an outpatient.   History of nephrolithiasis US renal 9/14 >> no hydronephrosis, left renal atrophy, multiple small bilateral calculi, small ascites  Enterococcus faecalis UTI 9/5   Mobility: PT eval when able to participate. Code Status:  Code Status: Full Code  Nutritional status: Body mass index is 29.84 kg/m. Nutrition Problem: Inadequate oral intake Etiology: altered GI function Signs/Symptoms: NPO status Diet Order            Diet clear liquid Room service appropriate? Yes; Fluid consistency: Thin  Diet effective now                 DVT prophylaxis: heparin injection 5,000 Units Start: 05/20/20 0930 SCDs Start: 05/20/2020 1738   Antimicrobials:  IV cefepime, IV Flagyl. Fluid: TPN Consultants: General surgery, critical care Family Communication:  called and updated patient's sister Ms. [name] this morning.  Status is: Inpatient  Remains inpatient appropriate because:Hemodynamically unstable, Altered  mental status and IV treatments appropriate due to intensity of illness or inability to take PO   Dispo: The patient is from: Home              Anticipated d/c is to: Unclear at this time              Anticipated d/c date is: > 3 days              Patient currently is not medically stable to d/c.   Infusions:  . sodium chloride 20 mL/hr at 05/22/2020 2359  . sodium chloride Stopped (05/18/20 2218)  . ceFEPime (MAXIPIME) IV 2 g (05/24/20 0444)  . metronidazole 500 mg (05/24/20 6734)  . TPN ADULT (ION) 85 mL/hr at 05/23/20 1716  . TPN ADULT (ION)      Scheduled Meds: . sodium chloride   Intravenous Once  . Chlorhexidine Gluconate Cloth  6 each Topical Daily  . Gerhardt's butt cream   Topical BID  . heparin injection (subcutaneous)  5,000 Units Subcutaneous Q8H  . lactulose  30 g Oral Once  . mouth rinse  15 mL Mouth Rinse BID  . pantoprazole (PROTONIX) IV  40 mg Intravenous Q24H    Antimicrobials: Anti-infectives (From admission, onward)   Start     Dose/Rate Route Frequency Ordered Stop   05/22/20 1600  ceFEPIme (MAXIPIME) 2 g in sodium chloride 0.9 % 100 mL IVPB        2 g 200 mL/hr over 30 Minutes Intravenous Every 12 hours 05/22/20 1459     05/18/20 1600  vancomycin (VANCOREADY) IVPB 750 mg/150 mL  Status:  Discontinued        750 mg 150 mL/hr over 60 Minutes Intravenous Every 24 hours 05/03/2020 1609 05/19/20 1024   05/18/20 1600  ceFEPIme (MAXIPIME) 2 g in sodium chloride 0.9 % 100 mL IVPB  Status:  Discontinued        2 g 200 mL/hr over 30 Minutes Intravenous Every 24 hours 05/23/2020 1609 05/02/2020 1610   05/05/2020 2230  metroNIDAZOLE (FLAGYL) IVPB 500 mg        500 mg 100 mL/hr over 60 Minutes Intravenous Every 8 hours 05/18/2020 2223     05/26/2020 1615  ceFEPIme (MAXIPIME) 2 g in sodium chloride 0.9 % 100 mL IVPB  Status:  Discontinued        2 g 200 mL/hr over 30 Minutes Intravenous Every 24 hours 05/23/2020 1610 05/22/20 1459   05/10/2020 1545  vancomycin (VANCOREADY) IVPB  1500 mg/300 mL        1,500 mg 150 mL/hr over 120 Minutes Intravenous  Once 05/26/2020 1544 05/18/20 0234   05/18/2020 1545  ceFEPIme (MAXIPIME) 2 g in sodium chloride 0.9 % 100 mL IVPB  Status:  Discontinued  2 g 200 mL/hr over 30 Minutes Intravenous  Once 05/19/2020 1544 05/03/2020 1610   05/09/2020 0600  cefoTEtan (CEFOTAN) 2 g in sodium chloride 0.9 % 100 mL IVPB        2 g 200 mL/hr over 30 Minutes Intravenous On call to O.R. 05/05/20 1533 05/09/2020 1106   05/05/20 1600  amoxicillin (AMOXIL) capsule 500 mg        500 mg Oral Every 12 hours 05/05/20 1447 05/10/20 2159      PRN meds: Place/Maintain arterial line **AND** sodium chloride, acetaminophen **OR** acetaminophen, haloperidol lactate, ondansetron **OR** ondansetron (ZOFRAN) IV, oxyCODONE, phenol   Objective: Vitals:   05/24/20 0700 05/24/20 0757  BP: (!) 124/56   Pulse: 81 62  Resp: 18   Temp: 99.1 F (37.3 C)   SpO2: 100%     Intake/Output Summary (Last 24 hours) at 05/24/2020 1303 Last data filed at 05/24/2020 0416 Gross per 24 hour  Intake 418.51 ml  Output 1042 ml  Net -623.49 ml   Filed Weights   05/22/20 0500 05/23/20 0500 05/24/20 0500  Weight: 78 kg 78.6 kg 78.9 kg   Weight change: 0.3 kg Body mass index is 29.84 kg/m.   Physical Exam: General exam: Resident grew mental status.  Not in physical distress.  On BiPAP. Skin: No rashes, lesions or ulcers. HEENT: Atraumatic, normocephalic, supple neck, no obvious bleeding Lungs: Clear to auscultation bilaterally CVS: Heart rate regulated.  Systolic ejection murmur. GI/Abd distended, bowel sounds sluggish.  JP drain in place.  Abdominal binder in place. CNS: Totally obtunded this morning Psychiatry: Obtunded Extremities: 1+ pedal edema bilaterally  Data Review: I have personally reviewed the laboratory data and studies available.  Recent Labs  Lab 05/20/20 0030 05/21/20 0337 05/22/20 0356 05/23/20 0337 05/24/20 0435  WBC 9.3 10.0 10.9* 10.6* 15.4*   NEUTROABS 8.1*  --   --   --  12.5*  HGB 9.6* 9.9* 10.3* 10.1* 11.0*  HCT 30.7* 32.5* 33.6* 34.0* 37.3  MCV 92.7 93.9 94.6 97.4 99.5  PLT 160 137* 109* 83* 91*   Recent Labs  Lab 05/05/2020 2249 05/19/2020 2257 05/18/20 0027 05/18/20 1109 05/20/20 0030 05/21/20 0337 05/22/20 0356 05/23/20 0337 05/24/20 0435  NA 137   < > 137   < > 143 145 143 145 147*  K 4.5   < > 4.4   < > 3.7 4.1 4.0 3.9 4.6  CL 106   < > 107   < > 105 107 109 110 114*  CO2 17*   < > 17*   < > 28 29 30 28 27   GLUCOSE 107*   < > 159*   < > 120* 139* 120* 137* 121*  BUN 71*   < > 72*   < > 56* 47* 44* 44* 51*  CREATININE 2.65*   < > 2.80*   < > 1.95* 1.49* 1.26* 1.06* 1.02*  CALCIUM 8.3*   < > 8.3*   < > 8.6* 8.8* 8.5* 8.2* 8.7*  MG 2.2  --  2.3  --  2.1  --   --  1.5* 2.2  PHOS  --   --  7.1*   < > 4.6 2.2* 2.7 3.1 2.7   < > = values in this interval not displayed.    F/u labs ordered  Signed, Terrilee Croak, MD [facility] 05/24/2020

## 2020-05-24 NOTE — Plan of Care (Signed)
  Problem: Education: Goal: Knowledge of General Education information will improve Description: Including pain rating scale, medication(s)/side effects and non-pharmacologic comfort measures Outcome: Progressing   Problem: Health Behavior/Discharge Planning: Goal: Ability to manage health-related needs will improve Outcome: Progressing   Problem: Clinical Measurements: Goal: Ability to maintain clinical measurements within normal limits will improve Outcome: Progressing Goal: Will remain free from infection Outcome: Progressing Goal: Diagnostic test results will improve Outcome: Progressing Goal: Respiratory complications will improve Outcome: Progressing Goal: Cardiovascular complication will be avoided Outcome: Progressing   Problem: Activity: Goal: Risk for activity intolerance will decrease Outcome: Progressing   Problem: Nutrition: Goal: Adequate nutrition will be maintained Outcome: Progressing   Problem: Coping: Goal: Level of anxiety will decrease Outcome: Progressing   Problem: Elimination: Goal: Will not experience complications related to bowel motility Outcome: Progressing Goal: Will not experience complications related to urinary retention Outcome: Progressing   Problem: Pain Managment: Goal: General experience of comfort will improve Outcome: Progressing   Problem: Safety: Goal: Ability to remain free from injury will improve Outcome: Progressing   Problem: Skin Integrity: Goal: Risk for impaired skin integrity will decrease Outcome: Progressing   Problem: Education: Goal: Understanding of discharge needs will improve Outcome: Progressing Goal: Verbalization of understanding of the causes of altered bowel function will improve Outcome: Progressing   Problem: Activity: Goal: Ability to tolerate increased activity will improve Outcome: Progressing   Problem: Bowel/Gastric: Goal: Gastrointestinal status for postoperative course will  improve Outcome: Progressing   Problem: Health Behavior/Discharge Planning: Goal: Identification of community resources to assist with postoperative recovery needs will improve Outcome: Progressing   Problem: Nutritional: Goal: Will attain and maintain optimal nutritional status will improve Outcome: Progressing   Problem: Clinical Measurements: Goal: Postoperative complications will be avoided or minimized Outcome: Progressing   Problem: Respiratory: Goal: Respiratory status will improve Outcome: Progressing   Problem: Skin Integrity: Goal: Will show signs of wound healing Outcome: Progressing   

## 2020-05-24 NOTE — Consult Note (Signed)
[place] Nurse ostomy follow up Patient in BiPap, unable to be taught anything as far as ostomy care. Patient is not interactive with [facility] nurse. Existing ostomy pouch intact with no signs of leakage or wash out. Requested unit secretary to order 2 pouches 2 3/4", skin barriers and rings present in room.  Cathlean Marseilles Tamala Julian, MSN, RN, [place], Lysle Pearl, [facility] Wound Treatment Associate Pager [phone]

## 2020-05-24 NOTE — Progress Notes (Signed)
Regina Richards [facility] Progress Note  7 Days Post-Op  Subjective: Patient on BiPAP this AM. Able to answer some questions. Denies abdominal pain. Having bowel function.   Objective: Vital signs in last 24 hours: Temp:  [97.9 F (36.6 C)-99.1 F (37.3 C)] 99.1 F (37.3 C) (09/27 0700) Pulse Rate:  [62-128] 62 (09/27 0757) Resp:  [17-33] 18 (09/27 0700) BP: (97-137)/(50-79) 124/56 (09/27 0700) SpO2:  [92 %-100 %] 100 % (09/27 0700) FiO2 (%):  [40 %-60 %] 40 % (09/27 0757) Weight:  [78.9 kg] 78.9 kg (09/27 0500) Last BM Date: 05/23/20  Intake/Output from previous day: 09/26 0701 - 09/27 0700 In: 2460.3 [I.V.:1929.9; IV Piggyback:530.4] Out: 1072 [Urine:925; Drains:120; Stool:27] Intake/Output this shift: No intake/output data recorded.  PE: General: pleasant, WD, overweight female who is laying in bed on BiPAP Heart: regular, rate, and rhythm. Palpable radial and pedal pulses bilaterally Lungs: rales bilaterally. BiPAP Abd: soft,non-tender,distended,midline woundclean with some dehiscence but no evisceration, stoma pink with red rubber bridge present and brown stool in bag, JP in RLQ with SS fluid      Lab Results:  Recent Labs    05/23/20 0337 05/24/20 0435  WBC 10.6* 15.4*  HGB 10.1* 11.0*  HCT 34.0* 37.3  PLT 83* 91*   BMET Recent Labs    05/23/20 0337 05/24/20 0435  NA 145 147*  K 3.9 4.6  CL 110 114*  CO2 28 27  GLUCOSE 137* 121*  BUN 44* 51*  CREATININE 1.06* 1.02*  CALCIUM 8.2* 8.7*   PT/INR No results for input(s): LABPROT, INR in the last 72 hours. CMP     Component Value Date/Time   NA 147 (H) 05/24/2020 0435   K 4.6 05/24/2020 0435   CL 114 (H) 05/24/2020 0435   CO2 27 05/24/2020 0435   GLUCOSE 121 (H) 05/24/2020 0435   BUN 51 (H) 05/24/2020 0435   CREATININE 1.02 (H) 05/24/2020 0435   CALCIUM 8.7 (L) 05/24/2020 0435   PROT 5.9 (L) 05/24/2020 0435   ALBUMIN 3.1 (L) 05/24/2020 0435   AST 36 05/24/2020 0435   ALT 16  05/24/2020 0435   ALKPHOS 123 05/24/2020 0435   BILITOT 0.8 05/24/2020 0435   GFRNONAA 55 (L) 05/24/2020 0435   GFRAA >60 05/24/2020 0435   Lipase  No results found for: LIPASE     Studies/Results: DG CHEST PORT 1 VIEW  Result Date: 05/23/2020 CLINICAL DATA:  Shortness of breath and chest pain. EXAM: PORTABLE CHEST 1 VIEW COMPARISON:  05/18/2020 and prior radiographs FINDINGS: The endotracheal tube and NG tube have been removed. A LEFT IJ central venous catheter with tip overlying the UPPER SVC again noted. This is a low volume film. Improved bibasilar atelectasis noted. There is no evidence of pneumothorax. Cardiomediastinal silhouette is unchanged. IMPRESSION: Endotracheal tube and NG tube removal with improved bibasilar atelectasis. Electronically Signed   By: Margarette Canada M.D.   On: 05/23/2020 08:37    Anti-infectives: Anti-infectives (From admission, onward)   Start     Dose/Rate Route Frequency Ordered Stop   05/22/20 1600  ceFEPIme (MAXIPIME) 2 g in sodium chloride 0.9 % 100 mL IVPB        2 g 200 mL/hr over 30 Minutes Intravenous Every 12 hours 05/22/20 1459     05/18/20 1600  vancomycin (VANCOREADY) IVPB 750 mg/150 mL  Status:  Discontinued        750 mg 150 mL/hr over 60 Minutes Intravenous Every 24 hours 05/01/2020 1609 05/19/20 1024   05/18/20 1600  ceFEPIme (MAXIPIME) 2 g in sodium chloride 0.9 % 100 mL IVPB  Status:  Discontinued        2 g 200 mL/hr over 30 Minutes Intravenous Every 24 hours 05/04/2020 1609 05/15/2020 1610   05/12/2020 2230  metroNIDAZOLE (FLAGYL) IVPB 500 mg        500 mg 100 mL/hr over 60 Minutes Intravenous Every 8 hours 05/09/2020 2223     05/14/2020 1615  ceFEPIme (MAXIPIME) 2 g in sodium chloride 0.9 % 100 mL IVPB  Status:  Discontinued        2 g 200 mL/hr over 30 Minutes Intravenous Every 24 hours 05/14/2020 1610 05/22/20 1459   05/20/2020 1545  vancomycin (VANCOREADY) IVPB 1500 mg/300 mL        1,500 mg 150 mL/hr over 120 Minutes Intravenous  Once  05/16/2020 1544 05/18/20 0234   05/19/2020 1545  ceFEPIme (MAXIPIME) 2 g in sodium chloride 0.9 % 100 mL IVPB  Status:  Discontinued        2 g 200 mL/hr over 30 Minutes Intravenous  Once 05/09/2020 1544 05/19/2020 1610   05/01/2020 0600  cefoTEtan (CEFOTAN) 2 g in sodium chloride 0.9 % 100 mL IVPB        2 g 200 mL/hr over 30 Minutes Intravenous On call to O.R. 05/05/20 1533 05/05/2020 1106   05/05/20 1600  amoxicillin (AMOXIL) capsule 500 mg        500 mg Oral Every 12 hours 05/05/20 1447 05/10/20 2159       Assessment/Plan Anxiety Cirrhosis - Child's Pugh Binitially,now likely worse;significant ascites intra-op  ABLAnemia -hgb11 this AM, stable AKI -Cr down to 1.02, UOP good Protein calorie malnutrition - prealbumin 9.6 Acute metabolic encephalopathy - ammonia elevated at 44 yesterday, I did not see that lactulose was given so I have ordered for today Acute Hypercarbic respiratory failure - on BiPAP this AM  Colon mass - adenocarcinoma 4.5 cm 0/32 lymph nodes T3,N0 S/P left hemicolectomy and liver biopsy9/9 Dr. Georgette Dover Pneumoperitoneum on POD #11 despite normal bowel function S/p ex-lap, LOA, transverse loop colostomy with drain placement 9/20 Dr. Kae Heller - POD#18/7 - having bowel function - JP output decreased significantly from last week, still SS - continue BID dressing changes  FEN: CLD, TPN VTE: SQ heparin  ID: flagyl 9/20>>, cefepime 9/25>>; WBC up to 15 check UA and resp cxs Foley: none  Patient seems to be declining significantly with respiratory failure. I would recommend palliative consult to establish [place].     LOS: 24 days    Norm Parcel , [facility] Surgery 05/24/2020, 8:10 AM Please see Amion for pager number during day hours 7:00am-4:30pm

## 2020-05-24 NOTE — Progress Notes (Signed)
Took patient off BiPAP. Pt's Sp02 dropped to 67%.  Placed pt on Venturi Mask 45% @ 10 lpm.  Pt's Sp02 is now 100%. Will continue to monitor.

## 2020-05-24 NOTE — Progress Notes (Signed)
PHARMACY - TOTAL PARENTERAL NUTRITION CONSULT NOTE  Indication: Postoperative ileus  Patient Measurements: Height: 5' 4.02" (162.6 cm) Weight: 78.9 kg (173 lb 15.1 oz) IBW/kg (Calculated) : 54.74 TPN AdjBW (KG): 61.1 Body mass index is 29.84 kg/m. EDW = 73-74 kg  Assessment:  81 YOF admitted 9/3 due to severe iron deficiency, thrombocytopenia, and cirrhosis. Colonoscopy on 9/6 positive for sigmoid mass; CT and biopsy supports diagnosis of colon cancer. Hemicolectomy performed 9/9. After patient's surgery on 9/6, dietary tolerance improved from clear liquid to soft diet on 9/13. Patient was tolerating soft diet until patient developed pneumoperineum requiring ex lap and loop colostomy with drain placement on 9/20. Due to postoperative ileus pharmacy consulted to start TPN.    9/26: Patient placed on bipap d/t episodes of bradycardia/O2 drops  Glucose / Insulin: no hx DM - CBGs < 180.   Electrolytes: Na 147 high. K 4.6 post 1 run yesterday (goal >/= 4 for ileus), Mag 2.2 post 2g yesterday (goal >/= 4 for ileus), Phos 2.7 low normal, Cl 114, others WNL Renal: AKI resolving - SCr down 1.06 (BL SCr 1), BUN 44 LFTs / TGs: LFTs / TG WNL, tbili 1.3 Prealbumin / albumin: albumin 3.1; prealbumin 9.6 Intake / Output; MIVF: UOP down 0.5 mL/kg/hr, drains down 176mL, ostomy 107mL, LBM 9/26, net +5.6L (down).  GI Imaging: None since TPN start Surgeries / Procedures: None since TPN start  Central access: CVC placed 05/18/20 TPN start date: 05/19/20  Nutritional Goals (per RD rec 9/22): kCal: 1950-2150, Protein: 100-125g, Fluid: >/= 1.8L  Current Nutrition:  TPN  Clear liquid diet started 9/25 - no intake charted  Plan:  Continue TPN at goal rate of 85 ml/hr, providing 120g AA, 275g CHO and 61g ILE for a total of 2030 kCal, meeting 100% of the patients needs.  Electrolytes in TPN:  Remove Na, incr Phos 75mmol/L, decr K 47mEq/L, Ca 98mEq/L, Mag 24mEq/L, max acetate Add standard MVI and trace  elements to TPN Add thiamine 100mg  and folic acid 1mg  d/t hx EtOH use Standard TPN labs on Mon and Thurs, daily Renal function panel F/U labs, PO intake/diet advancement, goals of care  Redgie Grayer, PharmD Candidate 05/24/2020, 8:32 AM

## 2020-05-25 ENCOUNTER — Inpatient Hospital Stay (HOSPITAL_COMMUNITY): Payer: Medicare Other

## 2020-05-25 DIAGNOSIS — Z515 Encounter for palliative care: Secondary | ICD-10-CM | POA: Diagnosis not present

## 2020-05-25 DIAGNOSIS — R627 Adult failure to thrive: Secondary | ICD-10-CM | POA: Diagnosis not present

## 2020-05-25 DIAGNOSIS — C189 Malignant neoplasm of colon, unspecified: Secondary | ICD-10-CM | POA: Diagnosis not present

## 2020-05-25 DIAGNOSIS — Z66 Do not resuscitate: Secondary | ICD-10-CM | POA: Diagnosis not present

## 2020-05-25 DIAGNOSIS — N179 Acute kidney failure, unspecified: Secondary | ICD-10-CM | POA: Diagnosis not present

## 2020-05-25 DIAGNOSIS — I351 Nonrheumatic aortic (valve) insufficiency: Secondary | ICD-10-CM

## 2020-05-25 LAB — ECHOCARDIOGRAM LIMITED
AV Mean grad: 13 mmHg
AV Peak grad: 20.8 mmHg
Ao pk vel: 2.28 m/s
Height: 64.016 in
Weight: 2804.25 oz

## 2020-05-25 LAB — COMPREHENSIVE METABOLIC PANEL
ALT: 16 U/L (ref 0–44)
AST: 32 U/L (ref 15–41)
Albumin: 2.7 g/dL — ABNORMAL LOW (ref 3.5–5.0)
Alkaline Phosphatase: 129 U/L — ABNORMAL HIGH (ref 38–126)
Anion gap: 6 (ref 5–15)
BUN: 49 mg/dL — ABNORMAL HIGH (ref 8–23)
CO2: 25 mmol/L (ref 22–32)
Calcium: 8.7 mg/dL — ABNORMAL LOW (ref 8.9–10.3)
Chloride: 118 mmol/L — ABNORMAL HIGH (ref 98–111)
Creatinine, Ser: 0.89 mg/dL (ref 0.44–1.00)
GFR calc Af Amer: >60 mL/min (ref >60)
GFR calc non Af Amer: >60 mL/min (ref >60)
Glucose, Bld: 120 mg/dL — ABNORMAL HIGH (ref 70–99)
Potassium: 4.7 mmol/L (ref 3.5–5.1)
Sodium: 149 mmol/L — ABNORMAL HIGH (ref 135–145)
Total Bilirubin: 0.7 mg/dL (ref 0.3–1.2)
Total Protein: 5.3 g/dL — ABNORMAL LOW (ref 6.5–8.1)

## 2020-05-25 LAB — CBC
HCT: 34.5 % — ABNORMAL LOW (ref 36.0–46.0)
Hemoglobin: 9.7 g/dL — ABNORMAL LOW (ref 12.0–15.0)
MCH: 28 pg (ref 26.0–34.0)
MCHC: 28.1 g/dL — ABNORMAL LOW (ref 30.0–36.0)
MCV: 99.7 fL (ref 80.0–100.0)
Platelets: 69 10*3/uL — ABNORMAL LOW (ref 150–400)
RBC: 3.46 MIL/uL — ABNORMAL LOW (ref 3.87–5.11)
RDW: 21.2 % — ABNORMAL HIGH (ref 11.5–15.5)
WBC: 9.5 10*3/uL (ref 4.0–10.5)
nRBC: 0 % (ref 0.0–0.2)

## 2020-05-25 LAB — LACTIC ACID, PLASMA: Lactic Acid, Venous: 1 mmol/L (ref 0.5–1.9)

## 2020-05-25 LAB — PROCALCITONIN: Procalcitonin: 0.4 ng/mL

## 2020-05-25 LAB — AMMONIA: Ammonia: 28 umol/L (ref 9–35)

## 2020-05-25 LAB — PHOSPHORUS: Phosphorus: 2.8 mg/dL (ref 2.5–4.6)

## 2020-05-25 MED ORDER — TRAVASOL 10 % IV SOLN
INTRAVENOUS | Status: DC
  Filled 2020-05-25: qty 1203.6

## 2020-05-25 MED ORDER — DEXTROSE 5 % IV SOLN: INTRAVENOUS | Status: DC

## 2020-05-25 NOTE — Progress Notes (Signed)
Occupational Therapy Treatment Patient Details Name: Regina Richards MRN: [id number] DOB: 1946/07/13 Today's Date: 05/25/2020    History of present illness Pt is 74 yo female with PMH including anxiety, HTN, and kidney stones.  Pt presented with SOB for 3-4 weeks.  She was found to have a hgb of 2.9 in the ED.  Pt admitted with symptomatic anemia and pancytopenia.  Pt had  colonscopy which showed partially obstructing sigmoid colon mass that was biopsied.  Pt is s/p L hemicolectomy , mobilization of splenic flexure, and liver biopsy on 05/13/2020. Pt underwent  ex-lap with abdominal washout and transverse loop colostomy on 9/20. Pt found to have large volume pneumoperitoneum during operation on 9/20. Pt intubated 9/20-9/22. 9/26 pt with respiratory failure requiring bipap. Pt also with encephalopathy acutely.   OT comments  Patient continues to make steady progress towards goals in skilled OT session. Patient's session encompassed sitting EOB, self feeding, and attempts at bed level exercises in order to improve endurance and overall activity tolerance. Pt remains weak in presentation, requiring min/mod of 2 to sit EOB, and requiring mod-max assist to self feed or complete basic grooming ADLs. Of note, pt would brady down after drinking ginger ale (lowest number noted 38 BPM) but HR would elevate after a few seconds. Pt able to stand x1 with max x2 and able to side step to [facility], and after getting back to bed was unable to complete bed level exercises due to fatigue. Discharge remains appropriate; will continue to follow acutely.    Follow Up Recommendations  SNF;Supervision/Assistance - 24 hour    Equipment Recommendations  Other (comment);3 in 1 bedside commode (defer to next venue)    Recommendations for Other Services      Precautions / Restrictions Precautions Precautions: Fall Precaution Comments: colostomy, abdominal binder, JP drain, NG tube, watch HR Restrictions Weight Bearing Restrictions:  No       Mobility Bed Mobility Overal bed mobility: Needs Assistance Bed Mobility: Supine to Sit;Sit to Supine     Supine to sit: +2 for physical assistance;HOB elevated;Max assist Sit to supine: Max assist;+2 for physical assistance   General bed mobility comments: max +2 assist for helicopter pivot to and from left side of bed with cues for sequence and pt assisting with initiating leg movement toward EOB  Transfers Overall transfer level: Needs assistance   Transfers: Sit to/from Stand Sit to Stand: Max assist;+2 physical assistance         General transfer comment: physical assist with knees blocked and pad to rise from surface and side step toward [facility] with assist to move LLE and support weight in standing    Balance Overall balance assessment: Needs assistance Sitting-balance support: Bilateral upper extremity supported;Feet unsupported Sitting balance-Leahy Scale: Fair Sitting balance - Comments: pt sat EOB 10 min with min assist with cues for trunk/neck positioning in midline and extension Postural control: Posterior lean;Right lateral lean                                 ADL either performed or assessed with clinical judgement   ADL Overall ADL's : Needs assistance/impaired Eating/Feeding: Moderate assistance;Sitting Eating/Feeding Details (indicate cue type and reason): Due to weakness, requires assist in order to bring ginger ale to mouth, by end of session able to use 2 hands with min a to bring to mouth x1  Functional mobility during ADLs: Moderate assistance;+2 for physical assistance;+2 for safety/equipment;Cueing for safety;Cueing for sequencing General ADL Comments: Pt presents with decreased cognition, generalized weakness, and decreased activity tolerance     Vision       Perception     Praxis      Cognition Arousal/Alertness: Awake/alert Behavior During Therapy: Flat affect Overall  Cognitive Status: Impaired/Different from baseline Area of Impairment: Orientation;Attention;Memory;Safety/judgement;Following commands;Awareness;Problem solving                 Orientation Level: Disoriented to;Situation;Place;Time Current Attention Level: Sustained Memory: Decreased recall of precautions;Decreased short-term memory Following Commands: Follows one step commands with increased time;Follows one step commands inconsistently Safety/Judgement: Decreased awareness of safety;Decreased awareness of deficits Awareness: Intellectual Problem Solving: Slow processing;Requires verbal cues;Requires tactile cues;Decreased initiation;Difficulty sequencing General Comments: pt trying to reach nose through venturi mask        Exercises General Exercises - Lower Extremity Straight Leg Raises: AROM;Left;Supine;5 reps   Shoulder Instructions       General Comments      Pertinent Vitals/ Pain       Pain Assessment: No/denies pain  Home Living                                          Prior Functioning/Environment              Frequency  Min 2X/week        Progress Toward Goals  OT Goals(current goals can now be found in the care plan section)  Progress towards OT goals: Progressing toward goals  Acute Rehab OT Goals Patient Stated Goal: Unable to state OT Goal Formulation: With patient Time For Goal Achievement: 05/31/20 Potential to Achieve Goals: Good  Plan Discharge plan remains appropriate    Co-evaluation    PT/OT/SLP Co-Evaluation/Treatment: Yes Reason for Co-Treatment: Complexity of the patient's impairments (multi-system involvement);Necessary to address cognition/behavior during functional activity;For patient/therapist safety PT goals addressed during session: Mobility/safety with mobility;Strengthening/ROM OT goals addressed during session: ADL's and self-care;Strengthening/ROM      AM-PAC OT "6 Clicks" Daily Activity      Outcome Measure   Help from another person eating meals?: A Lot Help from another person taking care of personal grooming?: A Lot Help from another person toileting, which includes using toliet, bedpan, or urinal?: A Lot Help from another person bathing (including washing, rinsing, drying)?: A Lot Help from another person to put on and taking off regular upper body clothing?: A Lot Help from another person to put on and taking off regular lower body clothing?: Total 6 Click Score: 11    End of Session    OT Visit Diagnosis: Unsteadiness on feet (R26.81);Other abnormalities of gait and mobility (R26.89);Muscle weakness (generalized) (M62.81);Other symptoms and signs involving cognitive function   Activity Tolerance Patient limited by fatigue   Patient Left in bed;with call bell/phone within reach;with bed alarm set (mitts reapplied)   Nurse Communication Mobility status;Other (comment) (bradying down after swallowing)        Time: 7425-9563 OT Time Calculation (min): 27 min  Charges: OT General Charges $OT Visit: 1 Visit OT Treatments $Self Care/Home Management : 8-22 mins  Corinne Ports E. Nargis Abrams, COTA/L Acute Rehabilitation Services [place] 05/25/2020, 1:51 PM

## 2020-05-25 NOTE — Progress Notes (Signed)
Pharmacy Antibiotic Note  Regina Richards is a 74 y.o. female admitted on 05/07/2020 with SOB and syncope, symptomatic iron deficiency anemia. Pt with AKI, likely multifactorial; renal function improving. Pharmacy has been consulted for cefepime dosing.    Plan: Continue cefepime 2g IV q12h Continue metronidazole per MD   Height: 5' 4.02" (162.6 cm) Weight: 79.5 kg (175 lb 4.3 oz) IBW/kg (Calculated) : 54.74  Temp (24hrs), Avg:98.1 F (36.7 C), Min:97.6 F (36.4 C), Max:98.7 F (37.1 C)  Recent Labs  Lab 05/21/20 0337 05/22/20 0356 05/23/20 0337 05/24/20 0435 05/25/20 0430  WBC 10.0 10.9* 10.6* 15.4* 9.5  CREATININE 1.49* 1.26* 1.06* 1.02* 0.89  LATICACIDVEN  --   --   --   --  1.0    Estimated Creatinine Clearance: 57.4 mL/min (by C-G formula based on SCr of 0.89 mg/dL).    Allergies  Allergen Reactions  . Sulfa Antibiotics Rash    Rash on legs    Antimicrobials this admission: 9/8 amoxicillin >> 9/13 9/9 cefotetan X 1 9/20 cefepime >> 9/20 vancomycin>>9/22 9/20 metronidazole >>  Microbiology results: 9/12 BCx X 2: NG/final 9/20 BCx X 2: ngtd 9/5 UCx: Enterococcus faecalis, pan sensitive (treated with amoxillin) 9/4 MRSA PCR: negative 9/7 Hepatitis A, B, C panel: negative 9/3 COVID: negative  Thank you for allowing pharmacy to be a part of this patient's care.  Arrie Senate, PharmD, BCPS Clinical Pharmacist [phone] Please check AMION for all [place] numbers 05/25/2020

## 2020-05-25 NOTE — Progress Notes (Addendum)
PHARMACY - TOTAL PARENTERAL NUTRITION CONSULT NOTE  Indication: Postoperative ileus  Patient Measurements: Height: 5' 4.02" (162.6 cm) Weight: 79.5 kg (175 lb 4.3 oz) IBW/kg (Calculated) : 54.74 TPN AdjBW (KG): 61.1 Body mass index is 30.07 kg/m. EDW = 73-74 kg  Assessment:  42 YOF admitted 9/3 due to severe iron deficiency, thrombocytopenia, and cirrhosis. Colonoscopy on 9/6 positive for sigmoid mass; CT and biopsy supports diagnosis of colon cancer. Hemicolectomy performed 9/9. After patient's surgery on 9/6, dietary tolerance improved from clear liquid to soft diet on 9/13. Patient was tolerating soft diet until patient developed pneumoperineum requiring ex lap and loop colostomy with drain placement on 9/20. Due to postoperative ileus pharmacy consulted to start TPN.    Glucose / Insulin: no hx DM - CBGs < 180.   Electrolytes: Na 149 (up) high (none in TPN). Cl 118 (up) high. Others WNL. Monitor K 4.7 (up) Renal: AKI resolving - SCr down 0.89 (BL SCr 1), BUN 49 LFTs / TGs: LFTs / TG / tbili WNL Prealbumin / albumin: albumin down 2.7; prealbumin 9.6 (up) Intake / Output; MIVF: UOP up 1.2 mL/kg/hr, drains down 178mL, ostomy 131mL, net +6L (up). MIVF D5 20mL/hr GI Imaging: None since TPN start Surgeries / Procedures: None since TPN start  Central access: CVC placed 05/18/20 TPN start date: 05/19/20  Nutritional Goals (per RD rec 9/22): kCal: 1950-2150, Protein: 100-125g, Fluid: >/= 1.8L  Current Nutrition:  TPN  Advanced to soft diet 9/28 but no intake d/t Bipap/Ventimask  Plan:  Continue TPN at goal rate of 85 ml/hr, providing 120g AA, 275g CHO and 61g ILE for a total of 2030 kCal, meeting 100% of the patients needs.  Electrolytes in TPN: no Na, Phos 19mmol/L, decr K 51mEq/L, Ca 34mEq/L, Mag 28mEq/L, max acetate Add standard MVI and trace elements to TPN Add thiamine 100mg  and folic acid 1mg  d/t hx EtOH use D5W at 61mL/hr for 24hrs for hypernatremia per MD Standard TPN labs on  Mon and Thurs, daily Renal function panel per MD F/U labs, PO intake/diet advancement, goals of care, Na and need to incr free water in TPN  Redgie Grayer, PharmD Candidate 05/25/2020, 8:37 AM  Remigio Eisenmenger D. Mina Marble, PharmD, BCPS, [employer] 05/25/2020, 10:36 AM

## 2020-05-25 NOTE — Progress Notes (Signed)
[facility] Progress Note  8 Days Post-Op  Subjective: Patient is somewhat more alert this AM. Denies abdominal pain. Having bowel function. Was on BiPAP overnight and back on venti mask this AM.   Objective: Vital signs in last 24 hours: Temp:  [97.5 F (36.4 C)-98.7 F (37.1 C)] 97.5 F (36.4 C) (09/28 0820) Pulse Rate:  [53-97] 53 (09/28 0800) Resp:  [17-22] 17 (09/28 0800) BP: (118-186)/(54-78) 186/78 (09/28 0800) SpO2:  [94 %-100 %] 94 % (09/28 0800) FiO2 (%):  [45 %] 45 % (09/28 0710) Weight:  [79.5 kg] 79.5 kg (09/28 0500) Last BM Date: 05/24/20  Intake/Output from previous day: 09/27 0701 - 09/28 0700 In: 3176.2 [I.V.:2454.7; IV Piggyback:721.5] Out: 2510 [Urine:2300; Drains:110; Stool:100] Intake/Output this shift: No intake/output data recorded.  PE: General: pleasant, WD,overweight female who is laying in bed on BiPAP Heart: regular, rate, and rhythm. Palpable radial and pedal pulses bilaterally Lungs: rales bilaterally. venti mask DJM:EQAS,TMH-DQQIWL,NLGXQJJHE,RDEYCXK woundclean with some dehiscence but no evisceration, stoma pink with red rubber bridge present and brown stool in bag, JP in RLQ withSS fluid   Lab Results:  Recent Labs    05/24/20 0435 05/25/20 0430  WBC 15.4* 9.5  HGB 11.0* 9.7*  HCT 37.3 34.5*  PLT 91* 69*   BMET Recent Labs    05/24/20 0435 05/25/20 0430  NA 147* 149*  K 4.6 4.7  CL 114* 118*  CO2 27 25  GLUCOSE 121* 120*  BUN 51* 49*  CREATININE 1.02* 0.89  CALCIUM 8.7* 8.7*   PT/INR No results for input(s): LABPROT, INR in the last 72 hours. CMP     Component Value Date/Time   NA 149 (H) 05/25/2020 0430   K 4.7 05/25/2020 0430   CL 118 (H) 05/25/2020 0430   CO2 25 05/25/2020 0430   GLUCOSE 120 (H) 05/25/2020 0430   BUN 49 (H) 05/25/2020 0430   CREATININE 0.89 05/25/2020 0430   CALCIUM 8.7 (L) 05/25/2020 0430   PROT 5.3 (L) 05/25/2020 0430   ALBUMIN 2.7 (L) 05/25/2020 0430   AST 32 05/25/2020  0430   ALT 16 05/25/2020 0430   ALKPHOS 129 (H) 05/25/2020 0430   BILITOT 0.7 05/25/2020 0430   GFRNONAA >60 05/25/2020 0430   GFRAA >60 05/25/2020 0430   Lipase  No results found for: LIPASE     Studies/Results: No results found.  Anti-infectives: Anti-infectives (From admission, onward)   Start     Dose/Rate Route Frequency Ordered Stop   05/22/20 1600  ceFEPIme (MAXIPIME) 2 g in sodium chloride 0.9 % 100 mL IVPB        2 g 200 mL/hr over 30 Minutes Intravenous Every 12 hours 05/22/20 1459     05/18/20 1600  vancomycin (VANCOREADY) IVPB 750 mg/150 mL  Status:  Discontinued        750 mg 150 mL/hr over 60 Minutes Intravenous Every 24 hours 05/03/2020 1609 05/19/20 1024   05/18/20 1600  ceFEPIme (MAXIPIME) 2 g in sodium chloride 0.9 % 100 mL IVPB  Status:  Discontinued        2 g 200 mL/hr over 30 Minutes Intravenous Every 24 hours 05/12/2020 1609 05/01/2020 1610   05/23/2020 2230  metroNIDAZOLE (FLAGYL) IVPB 500 mg        500 mg 100 mL/hr over 60 Minutes Intravenous Every 8 hours 05/06/2020 2223     05/25/2020 1615  ceFEPIme (MAXIPIME) 2 g in sodium chloride 0.9 % 100 mL IVPB  Status:  Discontinued  2 g 200 mL/hr over 30 Minutes Intravenous Every 24 hours 05/20/2020 1610 05/22/20 1459   05/16/2020 1545  vancomycin (VANCOREADY) IVPB 1500 mg/300 mL        1,500 mg 150 mL/hr over 120 Minutes Intravenous  Once 05/21/2020 1544 05/18/20 0234   05/10/2020 1545  ceFEPIme (MAXIPIME) 2 g in sodium chloride 0.9 % 100 mL IVPB  Status:  Discontinued        2 g 200 mL/hr over 30 Minutes Intravenous  Once 05/06/2020 1544 05/14/2020 1610   05/05/2020 0600  cefoTEtan (CEFOTAN) 2 g in sodium chloride 0.9 % 100 mL IVPB        2 g 200 mL/hr over 30 Minutes Intravenous On call to O.R. 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 Georgette Dover Pneumoperitoneumon POD #11 despite normal bowel function S/p ex-lap, LOA, transverse loop colostomy with drain placement 9/20 Dr. Kae Heller - POD#19/8 - having bowel function - ok to advance to soft diet, although patient not able to take in PO with venti mask on, continue TPN until respiratory status more stable  - JP output decreased significantly from last week, still SS - continue BID dressing changes  FEN: soft , TPN VTE: SQ heparin  ID: flagyl 9/20>>, cefepime 9/25>>; WBC 9 from 15 Foley: none  Patient seems a little more stable this AM but I would still recommend palliative consult to establish GOC.   LOS: 25 days    Norm Parcel , [facility] 05/25/2020, 9:35 AM Please see Amion for pager number during day hours 7:00am-4:30pm

## 2020-05-25 NOTE — Progress Notes (Signed)
Nutrition Follow-up  DOCUMENTATION CODES:   Not applicable  INTERVENTION:   - Recommend continuing TPN at goal rate given little to no PO intake and coughing when PO intake attempted  - RD will monitor for [place] discussions and order oral nutrition supplements as appropriate  NUTRITION DIAGNOSIS:   Inadequate oral intake related to altered GI function as evidenced by meal completion < 25%, per patient/family report.  Ongoing, diet advanced but PO intake 0%  GOAL:   Patient will meet greater than or equal to 90% of their needs  Met via TPN  MONITOR:   PO intake, Diet advancement, Labs, Weight trends, Skin, I & O's, Other (TPN)  REASON FOR ASSESSMENT:   Consult New TPN/TNA  ASSESSMENT:   74 year old female who presented on 9/03 with syncopal episode and SOB. PMH of anxiety, HTN, nephrolithiasis. Pt admitted with severe iron deficiency anemia.  9/04 - full liquid diet 9/05 - clear liquid diet 9/06 - s/p colonoscopy with findings of partially obstructing sigmoid colon mass and multiple colon polyps, clear liquid diet 9/09 - s/p left hemicolectomy with mobilization of the splenic flexure and liver biopsy, NPO then clear liquid diet later 9/12 - diet advanced to full liquids 9/13 - diet advanced to soft diet 9/20 - s/p ex-lap, LOA, transverse loop colostomy with JP drain placement for pneumoperitoneum 9/22 - extubated 9/23 - TPN start 9/24 - NGT d/c 9/25 - clear liquids 9/26 - rapid response due to bradycardia, BiPAP 9/27 - off BiPAP 9/28 - diet advanced to soft  Discussed pt with RN. Pt coughing with jello and with water today. Pt on/off BiPAP and Venti mask. Palliative has been consulted regarding [place]. Depending on outcome of Palliative consult, RN considering SLP consult due to concern for aspiration. Recommend continuing TPN at goal rate at this time.  Pt with +1 pitting generalized edema and +1 pitting edema to BUE.  Admit weight: 78.9 kg Current weight: 79.5  kg EDW: ~74 kg  Meal Completion: 0% since advanced to clears on 9/25  Medications reviewed and include: lactulose, protonix, IV abx, TPN IVF: D5 @ 50 ml/hr  Labs reviewed: sodium 149, hemoglobin 9.7  UOP: 2300 ml x 24 hours Right abdominal JP drain: 110 ml x 24 hours Colostomy: 100 ml x 24 hours I/O's: +14.2 L since admit  Diet Order:   Diet Order            DIET SOFT Room service appropriate? Yes; Fluid consistency: Thin  Diet effective now                 EDUCATION NEEDS:   No education needs have been identified at this time  Skin:  Skin Assessment: Skin Integrity Issues: Incisions: abdomen Other: fissure to coccyx, abrasion to buttocks  Last BM:  05/25/20 colostomy (100 ml x 24 hours)  Height:   Ht Readings from Last 1 Encounters:  05/02/2020 5' 4.02" (1.626 m)    Weight:   Wt Readings from Last 1 Encounters:  05/25/20 79.5 kg    Ideal Body Weight:  54.5 kg  BMI:  Body mass index is 30.07 kg/m.  Estimated Nutritional Needs:   Kcal:  1950-2150  Protein:  100-125 grams  Fluid:  >/= 1.8 L/day    Gaynell Face, MS, RD, LDN Inpatient Clinical Dietitian Please see AMiON for contact information.

## 2020-05-25 NOTE — Progress Notes (Signed)
PROGRESS NOTE  Regina Richards  DOB: 1946-05-01  PCP: Patient, No Pcp Per TFT:[phone]  DOA: 05/11/2020  LOS: 25 days   Chief Complaint  Patient presents with  . Loss of Consciousness   Brief narrative: PEMA Richards is a 74 y.o. female with PMH of HTN, nephrolithiasis, anxiety.  Timeline of events  9/3, admitted with 3 to 4-week history of progressively worsening shortness of breath.  Hemoglobin on admission was noted to be low at 2.7.  9/6, underwent EGD and colonoscopy.  Found to have sigmoid colon mass, partially obstructing plus multiple polyps. 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 General surgery was consulted.   9/9, underwent left hemicolectomy as well as liver biopsy.    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 Negative for lymphovascular or perineural invasion.   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   9/20, developed worsening abdominal pain, shortness of breath and leukocytosis. Chest x-ray showed new pneumoperitoneum. Underwent exploratory laparotomy.  Noted to have large volume pneumoperitoneum, 5 L of ascites with phlegmonous change in the left upper quadrant and around the colonic anastomosis on the left side, extensive inflammatory adhesions. Area of anastomotic leak was identified, adhesions were lysed and transverse colostomy was created.  She required 750 cc of albumin and 2 L of crystalloid during the procedure.  No blood was given.  She required Neo-Synephrine during the procedure but was weaned off and transferred to the ICU intubated and sedated on propofol. PCCM was consulted for postop ICU management.  9/22, extubated.  Awaiting return of bowel function.  Started on TPN  9/24, transfer out of the hospitalist  service  Subjective: Patient was seen and examined this morning. Lethargic.  On Ventimask.  Opens eyes on verbal command, soft voice.  Unable to follow other motor command. Labs from this morning shows worsening sodium level to 149, lactic acid level normal.  Procalcitonin only mildly elevated.  Assessment/Plan: Acute hypoxic/hypercapnic respiratory failure -Patient had postoperative respiratory failure.  She was intubated 9/20, extubated 9/22.  Since then, she had been able to maintain oxygen saturation on low-flow by nasal cannula.  -However, on 9/26 patient was noted to be obtunded.  PCO2 was elevated to 86. -BiPAP was initiated.  PCO2 improved to 51. -Currently weaned down to 45% Ventimask.  Unable to maintain oxygen saturation on nasal cannula.  Acute toxic metabolic encephalopathy -Currently patient is altered because of elevated CO2 and the effect of Seroquel given to control agitation.  Altered mental status also contributed by sepsis, prolonged ICU stay, liver cirrhosis, chronic small vessel ischemic disease, underlying anxiety disorder.   -Gradual improving mental status after CO2 level is improving. -Avoid sedatives. -Continue sitter -Oral oxycodone as needed for pain -Continue to monitor mental status change.  Postoperative ileus -improved -Hemicolectomy on 9/9, exploratory laparotomy on 9/20. -postoperative ileus is improving.  Has fecal contents colostomy bag NG tube removed on 9/24.  Currently on TPN.  Oral intake is allowed but limited because of poor mental status. -General surgery following. -Renal function and electrolytes being monitored -When able to participate, recommend out of bed to chair, PT OT eval.  Adenocarcinoma of colon s/p left hemicolectomy  -Colonic mass was identified on colonoscopy.  Underwent hemicolectomy 9/9.  Pathology showed invasive moderately differentiated adenocarcinoma, focally invading into pericolonic soft tissue, resection margins  negative for dysplasia or carcinoma. Negative  for lymphovascular or perineural invasion.  -Oncology evaluation needed after clinical improvement.  Leukocytosis -WBC count better today.  Echo ordered to rule out vegetation. Recent Labs  Lab 05/21/20 0337 05/22/20 0356 05/23/20 0337 05/24/20 0435 05/25/20 0430  WBC 10.0 10.9* 10.6* 15.4* 9.5  LATICACIDVEN  --   --   --   --  1.0  PROCALCITON  --   --   --   --  0.40   Acute on chronic blood loss anemia Iron deficiency anemia -On admission, hemoglobin was very low at 2.7, ferritin was low at 5 due to chronic GI loss from.  -Patient had intraoperative blood loss as well. -During this hospitalization, patient received a total of 1 unit of FFP and 8 units of PRBCs, last on 9/22. -Once infection improved, IV iron can be considered. Recent Labs    05/21/20 0337 05/22/20 0356 05/23/20 0337 05/24/20 0435 05/25/20 0430  HGB 9.9* 10.3* 10.1* 11.0* 9.7*   Recent Labs    05/23/2020 1944 05/01/20 1010 05/24/20 0435 05/25/20 0430  MCV  --    < > 99.5 99.7  FERRITIN 5*  --   --   --   TIBC 514*  --   --   --   IRON 10*  --   --   --   RETICCTPCT 2.7  --   --   --    < > = values in this interval not displayed.   Acute kidney injury -Creatinine normal at baseline -Running high this admission, peaked at 2.8 on 9/21.  Improving now, down to 1.02 today..  Continue to monitor. -Once her mental status and oral intake improves, will consider Lasix. Recent Labs    05/17/20 2249 05/18/20 0027 05/18/20 1600 05/19/20 0540 05/20/20 0030 05/21/20 0337 05/22/20 0356 05/23/20 0337 05/24/20 0435 05/25/20 0430  BUN 71* 72* 70* 66* 56* 47* 44* 44* 51* 49*  CREATININE 2.65* 2.80* 2.75* 2.57* 1.95* 1.49* 1.26* 1.06* 1.02* 0.89   Hypernatremia -Sodium level trending up, 149 today.  On TPN.  I also start the patient on D5W at 50 mill per hour this morning. -Has coexisting liver cirrhosis and has tendency to be volume overloaded. Continue to  monitor. Recent Labs  Lab 05/18/20 1109 05/18/20 1600 05/19/20 0540 05/20/20 0030 05/21/20 0337 05/22/20 0356 05/23/20 0337 05/24/20 0435 05/25/20 0430  NA 135 137 139 143 145 143 145 147* 149*   Hypokalemia/hypomagnesemi/hypophosphatemia -Corrected with replacement. Recent Labs  Lab 05/20/20 0030 05/20/20 0030 05/21/20 0337 05/22/20 0356 05/23/20 0337 05/24/20 0435 05/25/20 0430  K 3.7   < > 4.1 4.0 3.9 4.6 4.7  MG 2.1  --   --   --  1.5* 2.2  --   PHOS 4.6   < > 2.2* 2.7 3.1 2.7 2.8   < > = values in this interval not displayed.   Septic shock secondary to peritonitis -Briefly required pressors. Currently blood pressure stable without pressors. -Remains on IV cefepime and IV Flagyl.  Still sick.  We will continue antibiotics.  Liver cirrhosis -Noted on liver biopsy 9/9.  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  Ammonia level normal. -However, she was noted to have significant ascites Intra-op on 9/20. -Needs GI follow-up as an outpatient.   History of nephrolithiasis US renal 9/14 >> no hydronephrosis, left renal atrophy, multiple small bilateral calculi, small ascites  Enterococcus faecalis UTI 9/5   Goals of care: Patient is sick from multiple comorbidities including liver cirrhosis, open abdominal surgeries X2, poor  mental status and poor respiratory status. Multiple conversations with different family members over the course of hospitalization.  Family is in agreement about her poor prognosis and is heading towards palliative care.  Medical consultation called.  Discussed with general surgery team as well.  Mobility: PT eval when able to participate. Code Status:   Code Status: Full Code  Nutritional status: Body mass index is 30.07 kg/m. Nutrition Problem: Inadequate oral intake Etiology: altered GI function Signs/Symptoms: NPO status Diet Order            DIET SOFT Room service appropriate? Yes; Fluid consistency: Thin  Diet effective now                  DVT prophylaxis: heparin injection 5,000 Units Start: 05/20/20 0930 SCDs Start: 05/21/2020 1738   Antimicrobials:  IV cefepime, IV Flagyl. Fluid: TPN Consultants: General surgery, critical care Family Communication:  Will call family later today.  Status is: Inpatient  Remains inpatient appropriate because:Hemodynamically unstable, Altered mental status and IV treatments appropriate due to intensity of illness or inability to take PO   Dispo: The patient is from: Home              Anticipated d/c is to: Unclear at this time              Anticipated d/c date is: > 3 days              Patient currently is not medically stable to d/c.   Infusions:  . sodium chloride 20 mL/hr at 05/24/2020 2359  . sodium chloride Stopped (05/18/20 2218)  . ceFEPime (MAXIPIME) IV 2 g (05/25/20 0446)  . dextrose 50 mL/hr at 05/25/20 0824  . metronidazole 500 mg (05/25/20 0530)  . TPN ADULT (ION) 85 mL/hr at 05/25/20 0000    Scheduled Meds: . sodium chloride   Intravenous Once  . Chlorhexidine Gluconate Cloth  6 each Topical Daily  . Gerhardt's butt cream   Topical BID  . heparin injection (subcutaneous)  5,000 Units Subcutaneous Q8H  . lactulose  30 g Oral Once  . mouth rinse  15 mL Mouth Rinse BID  . pantoprazole (PROTONIX) IV  40 mg Intravenous Q24H    Antimicrobials: Anti-infectives (From admission, onward)   Start     Dose/Rate Route Frequency Ordered Stop   05/22/20 1600  ceFEPIme (MAXIPIME) 2 g in sodium chloride 0.9 % 100 mL IVPB        2 g 200 mL/hr over 30 Minutes Intravenous Every 12 hours 05/22/20 1459     05/18/20 1600  vancomycin (VANCOREADY) IVPB 750 mg/150 mL  Status:  Discontinued        750 mg 150 mL/hr over 60 Minutes Intravenous Every 24 hours 04/28/2020 1609 05/19/20 1024   05/18/20 1600  ceFEPIme (MAXIPIME) 2 g in sodium chloride 0.9 % 100 mL IVPB  Status:  Discontinued        2 g 200 mL/hr over 30 Minutes Intravenous Every 24 hours 05/10/2020 1609 05/15/2020 1610    05/04/2020 2230  metroNIDAZOLE (FLAGYL) IVPB 500 mg        500 mg 100 mL/hr over 60 Minutes Intravenous Every 8 hours 05/14/2020 2223     04/28/2020 1615  ceFEPIme (MAXIPIME) 2 g in sodium chloride 0.9 % 100 mL IVPB  Status:  Discontinued        2 g 200 mL/hr over 30 Minutes Intravenous Every 24 hours 05/18/2020 1610 05/22/20 1459   05/17/20 1545  vancomycin (  VANCOREADY) IVPB 1500 mg/300 mL        1,500 mg 150 mL/hr over 120 Minutes Intravenous  Once 05/11/2020 1544 05/18/20 0234   05/16/2020 1545  ceFEPIme (MAXIPIME) 2 g in sodium chloride 0.9 % 100 mL IVPB  Status:  Discontinued        2 g 200 mL/hr over 30 Minutes Intravenous  Once 04/30/2020 1544 05/16/2020 1610   05/21/2020 0600  cefoTEtan (CEFOTAN) 2 g in sodium chloride 0.9 % 100 mL IVPB        2 g 200 mL/hr over 30 Minutes Intravenous On call to O.R. 05/05/20 1533 05/03/2020 1106   05/05/20 1600  amoxicillin (AMOXIL) capsule 500 mg        500 mg Oral Every 12 hours 05/05/20 1447 05/10/20 2159      PRN meds: Place/Maintain arterial line **AND** sodium chloride, acetaminophen **OR** acetaminophen, haloperidol lactate, ondansetron **OR** ondansetron (ZOFRAN) IV, oxyCODONE, phenol   Objective: Vitals:   05/25/20 0800 05/25/20 0820  BP: (!) 186/78   Pulse: (!) 53   Resp: 17   Temp:  (!) 97.5 F (36.4 C)  SpO2: 94%     Intake/Output Summary (Last 24 hours) at 05/25/2020 0948 Last data filed at 05/25/2020 0400 Gross per 24 hour  Intake 3176.22 ml  Output 2510 ml  Net 666.22 ml   Filed Weights   05/23/20 0500 05/24/20 0500 05/25/20 0500  Weight: 78.6 kg 78.9 kg 79.5 kg   Weight change: 0.6 kg Body mass index is 30.07 kg/m.   Physical Exam: General exam: Opens eyes on verbal command.  Not in physical distress. On Venti mask. Skin: No rashes, lesions or ulcers. HEENT: Atraumatic, normocephalic, supple neck, no obvious bleeding Lungs: Clear to auscultation bilaterally CVS: Heart rate regulated.  Systolic ejection murmur. GI/Abd:  distended, bowel sounds sluggish.  JP drain in place.  Abdominal binder in place. CNS: slowly improving mental status but overall poor porgnosis. Psychiatry: Obtunded Extremities: 1+ pedal edema bilaterally  Data Review: I have personally reviewed the laboratory data and studies available.  Recent Labs  Lab 05/20/20 0030 05/20/20 0030 05/21/20 0337 05/22/20 0356 05/23/20 0337 05/24/20 0435 05/25/20 0430  WBC 9.3   < > 10.0 10.9* 10.6* 15.4* 9.5  NEUTROABS 8.1*  --   --   --   --  12.5*  --   HGB 9.6*   < > 9.9* 10.3* 10.1* 11.0* 9.7*  HCT 30.7*   < > 32.5* 33.6* 34.0* 37.3 34.5*  MCV 92.7   < > 93.9 94.6 97.4 99.5 99.7  PLT 160   < > 137* 109* 83* 91* 69*   < > = values in this interval not displayed.   Recent Labs  Lab 05/20/20 0030 05/20/20 0030 05/21/20 0337 05/22/20 0356 05/23/20 0337 05/24/20 0435 05/25/20 0430  NA 143   < > 145 143 145 147* 149*  K 3.7   < > 4.1 4.0 3.9 4.6 4.7  CL 105   < > 107 109 110 114* 118*  CO2 28   < > 29 30 28 27 25   GLUCOSE 120*   < > 139* 120* 137* 121* 120*  BUN 56*   < > 47* 44* 44* 51* 49*  CREATININE 1.95*   < > 1.49* 1.26* 1.06* 1.02* 0.89  CALCIUM 8.6*   < > 8.8* 8.5* 8.2* 8.7* 8.7*  MG 2.1  --   --   --  1.5* 2.2  --   PHOS 4.6   < >  2.2* 2.7 3.1 2.7 2.8   < > = values in this interval not displayed.    F/u labs ordered  Signed, Terrilee Croak, MD [facility] 05/25/2020

## 2020-05-25 NOTE — Consult Note (Signed)
Consultation Note Date: 05/25/2020   Patient Name: Regina Richards  DOB: Feb 21, 1946  MRN: [id number]  Age / Sex: 74 y.o., female  PCP: Patient, No Pcp Per Referring Physician: Terrilee Croak, MD  Reason for Consultation: Establishing goals of care and Psychosocial/spiritual support   HPI/Patient Profile: 74 y.o. female  admitted on 05/03/2020 with a PMH of HTN, nephrolithiasis, anxiety and with 3 to 4-week history of progressively worsening shortness of breath.  Hemoglobin on admission critically  Low.   9/6, underwent EGD and colonoscopy.  Found to have sigmoid colon mass, partially obstructing plus multiple polyps. 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 General surgery was consulted. 9/9, underwent left hemicolectomy as well as liver biopsy.  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 Negative for lymphovascular or perineural invasion. 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 9/20, developed worsening abdominal pain, shortness of breath and leukocytosis. Chest x-ray showed new pneumoperitoneum. Underwent exploratory laparotomy.  Noted to have large volume pneumoperitoneum, 5 L of ascites with phlegmonous change in the left upper quadrant and around the colonic anastomosis on the left side, extensive inflammatory adhesions. Area of anastomotic leak was identified, adhesions were lysed and transverse colostomy was created.  9/22, extubated.  Awaiting return of bowel function.  Started on TPN  Today is day 24 of hospital stay.  She continues to decline physically, functionally and cognitively in spite of maximum medical interventions.  Patient and family face treatment option decisions, advanced  directive decisions and anticipatory care needs.  Clinical Assessment and Goals of Care:  This NP Wadie Lessen reviewed medical records, received report from team, assessed the patient and then meet at the patient's bedside and spoke to two sister Buzzy and [name]  to discuss diagnosis, prognosis, GOC, EOL wishes disposition and options.   Concept of Palliative Care was introduced as specialized medical care for people and their families living with serious illness.  If focuses on providing relief from the symptoms and stress of a serious illness.  The goal is to improve quality of life for both the patient and the family.     A  discussion was had today regarding advanced directives.  Concepts specific to code status, artifical feeding and hydration, continued IV antibiotics and rehospitalization was had.    The difference between a aggressive medical intervention path  and a palliative comfort care path for this patient at this time was had.    Values and goals of care important to patient and family were attempted to be elicited.   Created space and opportunity for family to explore their thoughts and feelings regarding current medical situation.  All understand the seriousness of the current situation and likely poor prognosis.   Natural trajectory and expectations at EOL were discussed.  Questions and concerns addressed.  Patient  encouraged to call with questions or concerns.     PMT will continue to  support holistically.    Plan is to meet tomorrow at 2:30 with family for clarification of GOCs       No documented HPOA, family work in [employer]   [place]:  DNR-documented today   Palliative Prophylaxis:   Mouth care, skin care, pain assessment  Additional Recommendations (Limitations, Scope, Preferences):  Continue current medical interventions until family meet together and make decisions as a family    Psycho-social/Spiritual:   Desire for further Chaplaincy support: yes  Additional Recommendations: emotional support offered  Prognosis:  Prognosis is poor, will depend on desire for life prolonging measures  Discharge Planning: to be determined     Primary Diagnoses: Present on Admission: . Elevated blood pressure reading . Symptomatic anemia . Pancytopenia ([place]) . History of kidney stones . (Resolved) Syncope and collapse . (Resolved) Bradycardia . Thrombocytopenia ([place]) . Iron deficiency anemia . Adenocarcinoma of colon, Duke's A ([place]) . Depression . Benign neoplasm of descending colon . Liver cirrhosis secondary to NASH ([place]) . Obesity (BMI 30-39.9)   I have reviewed the medical record, interviewed the patient and family, and examined the patient. The following aspects are pertinent.  Past Medical History:  Diagnosis Date  . Anxiety   . Arthritis   . Dysrhythmia    hx of murmur   . History of kidney stones   . Hypertension    off bp meds " for a while"  . Pneumonia last 10 yrs ago   hx of  . PONV (postoperative nausea and vomiting)    hx of   Social History   Socioeconomic History  . Marital status: Divorced    Spouse name: Not on file  . Number of children: Not on file  . Years of education: Not on file  . Highest education level: Not on file  Occupational History  . Not on file  Tobacco Use  . Smoking status: Never Smoker  . Smokeless tobacco: Never Used  Vaping Use  . Vaping Use: Never used  Substance and Sexual Activity  . Alcohol use: Yes    Alcohol/week: 14.0 standard drinks    Types: 14 Cans of beer per week  . Drug use: No  . Sexual activity: Never  Other Topics Concern  . Not on file  Social History Narrative  . Not on file   Social Determinants of Health   Financial Resource Strain:   . Difficulty of Paying Living Expenses: Not on file  Food Insecurity:   . Worried About Charity fundraiser in the Last Year: Not on file  .  Ran Out of Food in the Last Year: Not on file  Transportation Needs:   . Lack of Transportation (Medical): Not on file  . Lack of Transportation (Non-Medical): Not on file  Physical Activity:   . Days of Exercise per Week: Not on file  . Minutes of Exercise per Session: Not on file  Stress:   . Feeling of Stress : Not on file  Social Connections:   . Frequency of Communication with Friends and Family: Not on file  . Frequency of Social Gatherings with Friends and Family: Not on file  . Attends Religious Services: Not on file  . Active Member of Clubs or Organizations: Not on file  . Attends Archivist Meetings: Not on file  . Marital Status: Not on file   History reviewed. No pertinent family history. Scheduled Meds: . sodium chloride   Intravenous Once  .  Chlorhexidine Gluconate Cloth  6 each Topical Daily  . Gerhardt's butt cream   Topical BID  . heparin injection (subcutaneous)  5,000 Units Subcutaneous Q8H  . lactulose  30 g Oral Once  . mouth rinse  15 mL Mouth Rinse BID  . pantoprazole (PROTONIX) IV  40 mg Intravenous Q24H   Continuous Infusions: . sodium chloride 20 mL/hr at 05/19/2020 2359  . sodium chloride Stopped (05/18/20 2218)  . ceFEPime (MAXIPIME) IV 2 g (05/25/20 0446)  . dextrose 50 mL/hr at 05/25/20 0824  . metronidazole 500 mg (05/25/20 0530)  . TPN ADULT (ION) 85 mL/hr at 05/25/20 0000  . TPN ADULT (ION)     PRN Meds:.Place/Maintain arterial line **AND** sodium chloride, acetaminophen **OR** acetaminophen, haloperidol lactate, ondansetron **OR** ondansetron (ZOFRAN) IV, oxyCODONE, phenol Medications Prior to Admission:  Prior to Admission medications   Medication Sig Start Date End Date Taking? Authorizing Provider  PARoxetine (PAXIL) 40 MG tablet TAKE 1 TABLET(40 MG) BY MOUTH EVERY MORNING Patient taking differently: Take 40 mg by mouth daily.  05/25/2020  Yes Hurst, Dorothea Glassman, PA-C  acetaminophen (TYLENOL) 325 MG tablet Take 2 tablets (650 mg total)  by mouth every 6 (six) hours as needed for mild pain (or Fever >/= 101). 05/12/20   Norm Parcel, PA-C  ciprofloxacin (CIPRO) 500 MG tablet Take 500 mg by mouth 2 (two) times daily. 05/14/2020   [provider]  methocarbamol (ROBAXIN) 500 MG tablet Take 1 tablet (500 mg total) by mouth every 8 (eight) hours as needed for muscle spasms. 05/12/20   Norm Parcel, PA-C  oxyCODONE (OXY IR/ROXICODONE) 5 MG immediate release tablet Take 1 tablet (5 mg total) by mouth every 6 (six) hours as needed for moderate pain or severe pain. 05/12/20   Norm Parcel, PA-C   Allergies  Allergen Reactions  . Sulfa Antibiotics Rash    Rash on legs   Review of Systems  Unable to perform ROS: Acuity of condition    Physical Exam Constitutional:      Appearance: She is cachectic. She is ill-appearing.  Cardiovascular:     Rate and Rhythm: Tachycardia present.  Pulmonary:     Effort: Tachypnea present.  Skin:    General: Skin is warm and dry.  Neurological:     Mental Status: She is lethargic.     Vital Signs: BP 137/64   Pulse 81   Temp 97.9 F (36.6 C) (Axillary)   Resp 19   Ht 5' 4.02" (1.626 m)   Wt 79.5 kg   LMP  (LMP Unknown)   SpO2 94%   BMI 30.07 kg/m  Pain Scale: 0-10 POSS *See Group Information*: 1-Acceptable,Awake and alert Pain Score: Asleep   SpO2: SpO2: 94 % O2 Device:SpO2: 94 % O2 Flow Rate: .O2 Flow Rate (L/min): 10 L/min  IO: Intake/output summary:   Intake/Output Summary (Last 24 hours) at 05/25/2020 1239 Last data filed at 05/25/2020 0400 Gross per 24 hour  Intake 3176.22 ml  Output 2510 ml  Net 666.22 ml    LBM: Last BM Date: 05/24/20 Baseline Weight: Weight: 78.9 kg Most recent weight: Weight: 79.5 kg     Palliative Assessment/Data: 30 % at best   Discussed with Dr Pietro Cassis  Time In: 1120 Time Out: 1230 Time Total: 70 minutes Greater than 50%  of this time was spent counseling and coordinating care related to the above assessment and  plan.  Signed by: Wadie Lessen, NP   Please contact Palliative Medicine Team phone  at [phone] for questions and concerns.  For individual provider: See Shea Evans

## 2020-05-25 NOTE — Progress Notes (Signed)
Physical Therapy Treatment Patient Details Name: Regina Richards MRN: [id number] DOB: 1946/07/22 Today's Date: 05/25/2020    History of Present Illness Pt is 74 yo female with PMH including anxiety, HTN, and kidney stones.  Pt presented with SOB for 3-4 weeks.  She was found to have a hgb of 2.9 in the ED.  Pt admitted with symptomatic anemia and pancytopenia.  Pt had  colonscopy which showed partially obstructing sigmoid colon mass that was biopsied.  Pt is s/p L hemicolectomy , mobilization of splenic flexure, and liver biopsy on 05/09/2020. Pt underwent  ex-lap with abdominal washout and transverse loop colostomy on 9/20. Pt found to have large volume pneumoperitoneum during operation on 9/20. Pt intubated 9/20-9/22. 9/26 pt with respiratory failure requiring bipap. Pt also with encephalopathy acutely.    PT Comments    Pt seen in conjunction with OT with focus of increasing mobility and balance. Pt able to transition to EOB with +2 assist and tolerate sitting 10 min. Pt with noted bradycardia down to 38-45 with swallowing EOB with gradual return to 78 HR also with drop in SpO2 from 94% to 88% on 10 L venturi at 45% FiO2. Pt able to follow limited commands with delay and with return to bed attempt for bil LE HEP with pt only participating with LLE SLR then non responsive to any further requests or commands.    Follow Up Recommendations  Supervision/Assistance - 24 hour;SNF     Equipment Recommendations  Wheelchair (measurements PT);Wheelchair cushion (measurements PT);Hospital bed    Recommendations for Other Services       Precautions / Restrictions Precautions Precautions: Fall Precaution Comments: colostomy, abdominal binder, JP drain, NG tube, watch HR    Mobility  Bed Mobility Overal bed mobility: Needs Assistance Bed Mobility: Supine to Sit;Sit to Supine     Supine to sit: +2 for physical assistance;HOB elevated;Max assist Sit to supine: Max assist;+2 for physical assistance    General bed mobility comments: max +2 assist for helicopter pivot to and from left side of bed with cues for sequence and pt assisting with initiating leg movement toward EOB  Transfers Overall transfer level: Needs assistance   Transfers: Sit to/from Stand Sit to Stand: Max assist;+2 physical assistance         General transfer comment: physical assist with knees blocked and pad to rise from surface and side step toward [facility] with assist to move LLE and support weight in standing  Ambulation/Gait                 Stairs             Wheelchair Mobility    Modified Rankin (Stroke Patients Only)       Balance Overall balance assessment: Needs assistance Sitting-balance support: Bilateral upper extremity supported;Feet unsupported Sitting balance-Leahy Scale: Fair Sitting balance - Comments: pt sat EOB 10 min with min assist with cues for trunk/neck positioning in midline and extension Postural control: Posterior lean;Right lateral lean                                  Cognition Arousal/Alertness: Awake/alert Behavior During Therapy: Flat affect Overall Cognitive Status: Impaired/Different from baseline Area of Impairment: Orientation;Attention;Memory;Safety/judgement;Following commands;Awareness;Problem solving                 Orientation Level: Disoriented to;Situation;Place;Time Current Attention Level: Sustained Memory: Decreased recall of precautions;Decreased short-term memory Following Commands: Follows one  step commands with increased time;Follows one step commands inconsistently Safety/Judgement: Decreased awareness of safety;Decreased awareness of deficits Awareness: Intellectual Problem Solving: Slow processing;Requires verbal cues;Requires tactile cues;Decreased initiation;Difficulty sequencing General Comments: pt trying to reach nose through venturi mask      Exercises General Exercises - Lower Extremity Straight Leg  Raises: AROM;Left;Supine;5 reps    General Comments        Pertinent Vitals/Pain Pain Assessment: No/denies pain    Home Living                      Prior Function            PT Goals (current goals can now be found in the care plan section) Progress towards PT goals: Progressing toward goals    Frequency    Min 2X/week      PT Plan Current plan remains appropriate    Co-evaluation PT/OT/SLP Co-Evaluation/Treatment: Yes Reason for Co-Treatment: Complexity of the patient's impairments (multi-system involvement);For patient/therapist safety PT goals addressed during session: Mobility/safety with mobility;Strengthening/ROM        AM-PAC PT "6 Clicks" Mobility   Outcome Measure  Help needed turning from your back to your side while in a flat bed without using bedrails?: Total Help needed moving from lying on your back to sitting on the side of a flat bed without using bedrails?: Total Help needed moving to and from a bed to a chair (including a wheelchair)?: Total Help needed standing up from a chair using your arms (e.g., wheelchair or bedside chair)?: Total Help needed to walk in hospital room?: Total Help needed climbing 3-5 steps with a railing? : Total 6 Click Score: 6    End of Session Equipment Utilized During Treatment: Oxygen Activity Tolerance: Patient tolerated treatment well Patient left: in bed;with call bell/phone within reach;Other (comment) (mittens reapplied bil) Nurse Communication: Mobility status PT Visit Diagnosis: Other abnormalities of gait and mobility (R26.89);Muscle weakness (generalized) (M62.81)     Time: 3474-2595 PT Time Calculation (min) (ACUTE ONLY): 26 min  Charges:  $Therapeutic Activity: 8-22 mins                     Keean Wilmeth P, PT Acute Rehabilitation Services Pager: [phone] Office: [phone]    Sandy Salaam Dawit Tankard 05/25/2020, 12:58 PM

## 2020-05-25 NOTE — Progress Notes (Signed)
°  Echocardiogram 2D Echocardiogram has been performed.  Regina Richards 05/25/2020, 12:10 PM

## 2020-05-26 DIAGNOSIS — R0600 Dyspnea, unspecified: Secondary | ICD-10-CM

## 2020-05-26 DIAGNOSIS — R52 Pain, unspecified: Secondary | ICD-10-CM

## 2020-05-26 DIAGNOSIS — N179 Acute kidney failure, unspecified: Secondary | ICD-10-CM | POA: Diagnosis not present

## 2020-05-26 DIAGNOSIS — R627 Adult failure to thrive: Secondary | ICD-10-CM | POA: Diagnosis not present

## 2020-05-26 DIAGNOSIS — F419 Anxiety disorder, unspecified: Secondary | ICD-10-CM

## 2020-05-26 DIAGNOSIS — R0609 Other forms of dyspnea: Secondary | ICD-10-CM

## 2020-05-26 LAB — RENAL FUNCTION PANEL
Albumin: 2.5 g/dL — ABNORMAL LOW (ref 3.5–5.0)
Anion gap: 5 (ref 5–15)
BUN: 50 mg/dL — ABNORMAL HIGH (ref 8–23)
CO2: 24 mmol/L (ref 22–32)
Calcium: 8.8 mg/dL — ABNORMAL LOW (ref 8.9–10.3)
Chloride: 116 mmol/L — ABNORMAL HIGH (ref 98–111)
Creatinine, Ser: 0.83 mg/dL (ref 0.44–1.00)
GFR calc Af Amer: >60 mL/min (ref >60)
GFR calc non Af Amer: >60 mL/min (ref >60)
Glucose, Bld: 124 mg/dL — ABNORMAL HIGH (ref 70–99)
Phosphorus: 2.7 mg/dL (ref 2.5–4.6)
Potassium: 4.7 mmol/L (ref 3.5–5.1)
Sodium: 145 mmol/L (ref 135–145)

## 2020-05-26 LAB — PROCALCITONIN: Procalcitonin: 0.3 ng/mL

## 2020-05-26 LAB — MAGNESIUM: Magnesium: 1.8 mg/dL (ref 1.7–2.4)

## 2020-05-26 MED ORDER — GLYCOPYRROLATE 0.2 MG/ML IJ SOLN
0.4000 mg | Freq: Four times a day (QID) | INTRAMUSCULAR | Status: DC
  Administered 2020-05-26 – 2020-05-27 (×4): 0.4 mg via INTRAVENOUS
  Filled 2020-05-26 (×4): qty 2

## 2020-05-26 MED ORDER — MAGNESIUM SULFATE IN D5W 1-5 GM/100ML-% IV SOLN
1.0000 g | Freq: Once | INTRAVENOUS | Status: AC
  Administered 2020-05-26: 1 g via INTRAVENOUS
  Filled 2020-05-26: qty 100

## 2020-05-26 MED ORDER — FUROSEMIDE 10 MG/ML IJ SOLN
40.0000 mg | Freq: Once | INTRAMUSCULAR | Status: AC
  Administered 2020-05-26: 40 mg via INTRAVENOUS
  Filled 2020-05-26: qty 4

## 2020-05-26 MED ORDER — TRAVASOL 10 % IV SOLN
INTRAVENOUS | Status: DC
  Filled 2020-05-26: qty 1203.6

## 2020-05-26 MED ORDER — SODIUM CHLORIDE 0.9% FLUSH: 10.0000 mL | INTRAVENOUS | Status: DC | PRN

## 2020-05-26 MED ORDER — MORPHINE SULFATE (PF) 2 MG/ML IV SOLN
2.0000 mg | INTRAVENOUS | Status: DC | PRN
  Administered 2020-05-26 – 2020-05-27 (×5): 2 mg via INTRAVENOUS
  Filled 2020-05-26 (×6): qty 1

## 2020-05-26 MED ORDER — LORAZEPAM 2 MG/ML IJ SOLN
2.0000 mg | INTRAMUSCULAR | Status: DC
  Administered 2020-05-26 – 2020-05-27 (×6): 2 mg via INTRAVENOUS
  Filled 2020-05-26 (×6): qty 1

## 2020-05-26 NOTE — Progress Notes (Signed)
   05/26/20 1600  Clinical Encounter Type  Visited With Patient and family together;Health care provider  Visit Type Spiritual support  Referral From Nurse  Consult/Referral To Chaplain  Spiritual Encounters  Spiritual Needs Prayer;[facility] text;Emotional  Chaplain was called to bedside of the patient with her sisters Shelby Mattocks, Jan, Nelchina, & Radonna Ricker) to pray and offer sacred text. The patient was placed in comfort care and the chaplain provided spiritual and emotional support to the family at bedside along with the nurses.

## 2020-05-26 NOTE — Progress Notes (Signed)
OT Cancellation Note/Discharge  Patient Details Name: CINTIA GLEED MRN: [id number] DOB: [birth year]   Cancelled Treatment:    Reason Eval/Treat Not Completed:  (Pt now receiving comfort care per RN note.)  Malka So 05/26/2020, 3:42 PM  Nestor Lewandowsky, OTR/L Acute Rehabilitation Services Pager: [phone] Office: [phone]

## 2020-05-26 NOTE — Progress Notes (Addendum)
9 Days Post-Op   Subjective/Chief Complaint: Not as alert this am, sats in high 80s on bipap right now, stool in bag   Objective: Vital signs in last 24 hours: Temp:  [97.5 F (36.4 C)-98.5 F (36.9 C)] 98 F (36.7 C) (09/29 0700) Pulse Rate:  [63-91] 65 (09/29 0700) Resp:  [16-23] 17 (09/29 0700) BP: (125-154)/(49-64) 149/56 (09/29 0700) SpO2:  [92 %-100 %] 92 % (09/29 0700) FiO2 (%):  [40 %] 40 % (09/28 1436) Weight:  [78.6 kg] 78.6 kg (09/29 0750) Last BM Date: 05/25/20  Intake/Output from previous day: 09/28 0701 - 09/29 0700 In: 2100.1 [I.V.:2100.1] Out: 2520 [Urine:2350; Drains:120; Stool:50] Intake/Output this shift: Total I/O In: 240.7 [I.V.:240.7] Out: 10 [Drains:10]   GBT:DVVO,HYW-VPXTGG,YIRSWN distended,midline woundclean with somedehiscence and ischemia but no evisceration, stoma pink with red rubber bridge presentand brown stool in bag, JP in RLQ withSS fluid   Lab Results:  Recent Labs    05/24/20 0435 05/25/20 0430  WBC 15.4* 9.5  HGB 11.0* 9.7*  HCT 37.3 34.5*  PLT 91* 69*   BMET Recent Labs    05/25/20 0430 05/26/20 0437  NA 149* 145  K 4.7 4.7  CL 118* 116*  CO2 25 24  GLUCOSE 120* 124*  BUN 49* 50*  CREATININE 0.89 0.83  CALCIUM 8.7* 8.8*   PT/INR No results for input(s): LABPROT, INR in the last 72 hours. ABG Recent Labs    05/23/20 1117  PHART 7.356  HCO3 28.1*    Studies/Results: ECHOCARDIOGRAM LIMITED  Result Date: 05/25/2020    ECHOCARDIOGRAM LIMITED REPORT   Patient Name:   Regina Richards Date of Exam: 05/25/2020 Medical Rec #:  [id number]     Height:       64.0 in Accession #:    [id number]    Weight:       175.3 lb Date of Birth:  1946-02-13     BSA:          1.850 m Patient Age:    74 years      BP:           154/61 mmHg Patient Gender: F             HR:           91 bpm. Exam Location:  Inpatient Procedure: 2D Echo Indications:    murmur 785.2  History:        Patient has prior history of Echocardiogram  examinations, most                 recent 05/01/2020. Risk Factors:Hypertension.  Sonographer:    Jannett Celestine RDCS (AE) Referring Phys: [id number] [facility]  Sonographer Comments: No apical window and suboptimal subcostal window. limited mobility. IMPRESSIONS  1. Limited study due to poor windows, poor patient mobility - off axis images  2. Left ventricular ejection fraction, by estimation, is 70 to 75%. The left ventricle has hyperdynamic function.  3. The mitral valve is grossly normal. Trivial mitral valve regurgitation.  4. The aortic valve is tricuspid. Aortic valve regurgitation is mild. Mild aortic valve sclerosis is present, with no evidence of aortic valve stenosis. Aortic valve mean gradient measures 13.0 mmHg. The aortic valve leaflet excursion appears normal.  5. There is moderately elevated pulmonary artery systolic pressure. The estimated right ventricular systolic pressure is 38.1 mmHg.  6. The inferior vena cava is normal in size with greater than 50% respiratory variability, suggesting right atrial pressure of 3 mmHg. FINDINGS  Left  Ventricle: Left ventricular ejection fraction, by estimation, is 70 to 75%. The left ventricle has hyperdynamic function. Right Ventricle: There is moderately elevated pulmonary artery systolic pressure. The tricuspid regurgitant velocity is 3.30 m/s, and with an assumed right atrial pressure of 3 mmHg, the estimated right ventricular systolic pressure is 60.7 mmHg. Mitral Valve: The mitral valve is grossly normal. Trivial mitral valve regurgitation. Tricuspid Valve: The tricuspid valve is grossly normal. Tricuspid valve regurgitation is trivial. Aortic Valve: The aortic valve is tricuspid. Aortic valve regurgitation is mild. Mild aortic valve sclerosis is present, with no evidence of aortic valve stenosis. Aortic valve mean gradient measures 13.0 mmHg. Aortic valve peak gradient measures 20.8 mmHg. Venous: The inferior vena cava is normal in size with greater than 50%  respiratory variability, suggesting right atrial pressure of 3 mmHg. AORTIC VALVE AV Vmax:           228.00 cm/s AV Vmean:          174.000 cm/s AV VTI:            0.418 m AV Peak Grad:      20.8 mmHg AV Mean Grad:      13.0 mmHg LVOT Vmax:         149.00 cm/s LVOT Vmean:        114.000 cm/s LVOT VTI:          0.313 m LVOT/AV VTI ratio: 0.75 TRICUSPID VALVE TR Peak grad:   43.6 mmHg TR Vmax:        330.00 cm/s  SHUNTS Systemic VTI: 0.31 m Lyman Bishop MD Electronically signed by Lyman Bishop MD Signature Date/Time: 05/25/2020/12:16:45 PM    Final     Anti-infectives: Anti-infectives (From admission, onward)   Start     Dose/Rate Route Frequency Ordered Stop   05/22/20 1600  ceFEPIme (MAXIPIME) 2 g in sodium chloride 0.9 % 100 mL IVPB        2 g 200 mL/hr over 30 Minutes Intravenous Every 12 hours 05/22/20 1459     05/18/20 1600  vancomycin (VANCOREADY) IVPB 750 mg/150 mL  Status:  Discontinued        750 mg 150 mL/hr over 60 Minutes Intravenous Every 24 hours 05/16/2020 1609 05/19/20 1024   05/18/20 1600  ceFEPIme (MAXIPIME) 2 g in sodium chloride 0.9 % 100 mL IVPB  Status:  Discontinued        2 g 200 mL/hr over 30 Minutes Intravenous Every 24 hours 05/03/2020 1609 05/05/2020 1610   05/01/2020 2230  metroNIDAZOLE (FLAGYL) IVPB 500 mg        500 mg 100 mL/hr over 60 Minutes Intravenous Every 8 hours 05/14/2020 2223     05/15/2020 1615  ceFEPIme (MAXIPIME) 2 g in sodium chloride 0.9 % 100 mL IVPB  Status:  Discontinued        2 g 200 mL/hr over 30 Minutes Intravenous Every 24 hours 05/19/2020 1610 05/22/20 1459   05/07/2020 1545  vancomycin (VANCOREADY) IVPB 1500 mg/300 mL        1,500 mg 150 mL/hr over 120 Minutes Intravenous  Once 05/04/2020 1544 05/18/20 0234   05/11/2020 1545  ceFEPIme (MAXIPIME) 2 g in sodium chloride 0.9 % 100 mL IVPB  Status:  Discontinued        2 g 200 mL/hr over 30 Minutes Intravenous  Once 05/06/2020 1544 05/24/2020 1610   05/25/2020 0600  cefoTEtan (CEFOTAN) 2 g in sodium chloride 0.9 %  100 mL IVPB  2 g 200 mL/hr over 30 Minutes Intravenous On call to O.R. 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 Georgette Dover Pneumoperitoneumon POD #11 despite normal bowel function S/p ex-lap, LOA, transverse loop colostomy with drain placement 9/20 Dr. Kae Heller -having bowel function - ok to advance to soft diet, although patient not able to take in PO with venti mask on, continue TPN until respiratory status more stable  -if unable to eat she could get transitioned to cortrac instead of tpn - JP output ss, will leave in for now but likely can come out soon - continue BID dressing changes and monitor wound -colostomy bridge can be removed as early as tomorrow when changing bag FEN: can have po when she can do it, consider enteral feeds, TPN VTE: SQ heparin  ID: flagyl 9/20>>, cefepime 9/25>>; WBC 9 from 15 yesterday Foley: none Dispo: agree with [facility] meeting, I do not think there is good endpoint for her unfortunately  Anxiety Cirrhosis  ABLAnemia -hgb9.7 yesterday AKI -Cr 0.83, UOP good Protein calorie malnutrition - prealbumin 9.6 Rolm Bookbinder 05/26/2020

## 2020-05-26 NOTE — Progress Notes (Signed)
PHARMACY - TOTAL PARENTERAL NUTRITION CONSULT NOTE  Indication: Postoperative ileus  Patient Measurements: Height: 5' 4.02" (162.6 cm) Weight: 78.6 kg (173 lb 4.5 oz) IBW/kg (Calculated) : 54.74 TPN AdjBW (KG): 61.1 Body mass index is 29.73 kg/m. EDW = 73-74 kg  Assessment:  10 YOF admitted 9/3 due to severe iron deficiency, thrombocytopenia, and cirrhosis. Colonoscopy on 9/6 positive for sigmoid mass; CT and biopsy supports diagnosis of colon cancer. Hemicolectomy performed 9/9. After patient's surgery on 9/6, dietary tolerance improved from clear liquid to soft diet on 9/13. Patient was tolerating soft diet until patient developed pneumoperineum requiring ex lap and loop colostomy with drain placement on 9/20. Due to postoperative ileus pharmacy consulted to start TPN.    Glucose / Insulin: no hx DM - CBGs < 180.   Electrolytes: Na 145 (D5W _0 /hr for 24 hrs yesterday), Cl 116 (down) high. Mag 1.8 (ileus goal >/= 2). Others WNL. Monitor K 4.7, Na 145 (down) Renal: SCr down 0.83 (BL SCr 1), BUN 50 LFTs / TGs: AST / ALT  / TG / tbili WNL, alk phos 129 Prealbumin / albumin: albumin down 2.5; prealbumin 9.6 (up) Intake / Output; MIVF: UOP 1.2 mL/kg/hr, drains up 174m, ostomy 510m net/24hr -[id number]).  GI Imaging: None since TPN start Surgeries / Procedures: None since TPN start  Central access: CVC placed 05/18/20 TPN start date: 05/19/20  Nutritional Goals (per RD rec 9/22): kCal: 1950-2150, Protein: 100-125g, Fluid: >/= 1.8L  Current Nutrition:  TPN  Advanced to soft diet 9/28 but no intake d/t Bipap/Ventimas -9/28: Noted per dietician aspiration concern, coughing w/ jello+water -9/29: Per RN, NPO on bipap   Plan:  Continue TPN at goal rate of 85 ml/hr, providing 120g AA, 275g CHO and 61g ILE for a total of 2030 kCal, meeting 100% of the patients needs.  Electrolytes in TPN: no Na, Phos 6m43mL, K 30mE84m Ca 5mEq/43mincr Mag 10mEq/63max acetate Mag Sulfate x1g Add  standard MVI and trace elements to TPN Add thiamine 100mg a941LTlic acid 1mg d/t2m EtOH use Standard TPN labs on Mon and Thurs, daily Renal function panel per MD F/U labs, PO intake/diet advancement, goals of care  Bastien Strawser RiRedgie Grayer Candidate 05/26/2020, 7:47 AM

## 2020-05-26 NOTE — Progress Notes (Signed)
PROGRESS NOTE  Regina Richards  DOB: [birth year]  PCP: Patient, No Pcp Per UTM:[id number]  DOA: 05/11/2020  LOS: 26 days   Chief Complaint  Patient presents with  . Loss of Consciousness   Brief narrative: Regina Richards is a 74 y.o. female with PMH of HTN, nephrolithiasis, anxiety.  Timeline of events  9/3, admitted with 3 to 4-week history of progressively worsening shortness of breath.  Hemoglobin on admission was noted to be low at 2.7.  9/6, underwent EGD and colonoscopy.  Found to have sigmoid colon mass, partially obstructing plus multiple polyps. 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 General surgery was consulted.   9/9, underwent left hemicolectomy as well as liver biopsy.    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 Negative for lymphovascular or perineural invasion.   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   9/20, developed worsening abdominal pain, shortness of breath and leukocytosis. Chest x-ray showed new pneumoperitoneum. Underwent exploratory laparotomy.  Noted to have large volume pneumoperitoneum, 5 L of ascites with phlegmonous change in the left upper quadrant and around the colonic anastomosis on the left side, extensive inflammatory adhesions. Area of anastomotic leak was identified, adhesions were lysed and transverse colostomy was created.  She required 750 cc of albumin and 2 L of crystalloid during the procedure.  No blood was given.  She required Neo-Synephrine during the procedure but was weaned off and transferred to the ICU intubated and sedated on propofol. PCCM was consulted for postop ICU management.  9/22, extubated.  Awaiting return of bowel function.  Started on TPN  9/24, transfer out of the hospitalist  service  Subjective: Patient was seen and examined this morning. Remains on BiPAP.  Minimally alert.  Nods head Labs improving but mental status did not show significant improvement.  Assessment/Plan: Acute hypoxic/hypercapnic respiratory failure -Patient had postoperative respiratory failure.  She was intubated 9/20, extubated 9/22.  Since then, she had been able to maintain oxygen saturation on low-flow by nasal cannula.  -However, on 9/26 patient was noted to be obtunded.  PCO2 was elevated to 86. -BiPAP was initiated.  PCO2 improved eventually but patient continues to remain dependent on Ventimask.  Acute toxic metabolic encephalopathy -Continues to remain altered off Seroquel.  Only minimal change in mental status.  . -Avoid sedatives. -Continue sitter -Continue to monitor mental status change.  Postoperative ileus -improved -Hemicolectomy on 9/9, exploratory laparotomy on 9/20. -postoperative ileus is improving.  Has fecal contents colostomy bag NG tube removed on 9/24.  Currently on TPN.  Oral intake is allowed but limited because of poor mental status. -General surgery following. -Renal function and electrolytes being monitored -When able to participate, recommend out of bed to chair, PT OT eval.  Adenocarcinoma of colon s/p left hemicolectomy  -Colonic mass was identified on colonoscopy.  Underwent hemicolectomy 9/9.  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 Negative for lymphovascular or perineural invasion.  -Oncology evaluation needed after clinical improvement.  Leukocytosis -WBC count better today.  Echo ordered to rule out vegetation. Recent Labs  Lab 05/21/20 0337 05/22/20 0356 05/23/20 0337 05/24/20 0435 05/25/20 0430 05/26/20 0437  WBC 10.0 10.9* 10.6* 15.4* 9.5  --   LATICACIDVEN  --   --   --   --  1.0  --  PROCALCITON  --    --   --   --  0.40 0.30   Acute on chronic blood loss anemia Iron deficiency anemia -On admission, hemoglobin was very low at 2.7, ferritin was low at 5 due to chronic GI loss from.  -Patient had intraoperative blood loss as well. -During this hospitalization, patient received a total of 1 unit of FFP and 8 units of PRBCs, last on 9/22. -Once infection improved, IV iron can be considered. Recent Labs    05/21/20 0337 05/22/20 0356 05/23/20 0337 05/24/20 0435 05/25/20 0430  HGB 9.9* 10.3* 10.1* 11.0* 9.7*   Recent Labs    05/07/2020 1944 05/01/20 1010 05/24/20 0435 05/25/20 0430  MCV  --    < > 99.5 99.7  FERRITIN 5*  --   --   --   TIBC 514*  --   --   --   IRON 10*  --   --   --   RETICCTPCT 2.7  --   --   --    < > = values in this interval not displayed.   Acute kidney injury -Creatinine normal at baseline -Running high this admission, peaked at 2.8 on 9/21.  Improving now, down to 1.02 today..  Continue to monitor. Recent Labs    05/18/20 0027 05/18/20 1600 05/19/20 0540 05/20/20 0030 05/21/20 0337 05/22/20 0356 05/23/20 0337 05/24/20 0435 05/25/20 0430 05/26/20 0437  BUN 72* 70* 66* 56* 47* 44* 44* 51* 49* 50*  CREATININE 2.80* 2.75* 2.57* 1.95* 1.49* 1.26* 1.06* 1.02* 0.89 0.83   Hypernatremia -Sodium trended up to 149.  Improved with D5W.  Recent Labs  Lab 05/20/20 0030 05/21/20 0337 05/22/20 0356 05/23/20 0337 05/24/20 0435 05/25/20 0430 05/26/20 0437  NA 143 145 143 145 147* 149* 145   Hypokalemia/hypomagnesemi/hypophosphatemia -Corrected with replacement. Recent Labs  Lab 05/20/20 0030 05/21/20 0337 05/22/20 0356 05/23/20 0337 05/24/20 0435 05/25/20 0430 05/26/20 0437  K 3.7   < > 4.0 3.9 4.6 4.7 4.7  MG 2.1  --   --  1.5* 2.2  --  1.8  PHOS 4.6   < > 2.7 3.1 2.7 2.8 2.7   < > = values in this interval not displayed.   Septic shock secondary to peritonitis -Briefly required pressors. Currently blood pressure stable without  pressors. -Remains on IV cefepime and IV Flagyl.  Still sick.  We will continue antibiotics.  Liver cirrhosis -Noted on liver biopsy 9/9.  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  Ammonia level normal. -However, she was noted to have significant ascites Intra-op on 9/20. -Needs GI follow-up as an outpatient.   History of nephrolithiasis US renal 9/14 >> no hydronephrosis, left renal atrophy, multiple small bilateral calculi, small ascites  Enterococcus faecalis UTI 9/5   Goals of care: Patient is sick from multiple comorbidities including liver cirrhosis, open abdominal surgeries X2, poor mental status and poor respiratory status. Multiple conversations with different family members over the course of hospitalization.  Family is in agreement about her poor prognosis and is heading towards palliative care.   Discussed with general surgery team as well.  Mobility: Unable to participate with PT because of poor mental status Code Status:   Code Status: DNR  Nutritional status: Body mass index is 29.73 kg/m. Nutrition Problem: Inadequate oral intake Etiology: altered GI function Signs/Symptoms: meal completion < 25%, per patient/family report Diet Order            DIET SOFT Room service appropriate? Yes; Fluid consistency:  Thin  Diet effective now                 DVT prophylaxis: heparin injection 5,000 Units Start: 05/20/20 0930 SCDs Start: 05/24/2020 1738   Antimicrobials:  IV cefepime, IV Flagyl. Fluid: TPN Consultants: General surgery, critical care Family Communication:  Daily update to family Status is: Inpatient  Remains inpatient appropriate because:Hemodynamically unstable, Altered mental status and IV treatments appropriate due to intensity of illness or inability to take PO   Dispo: The patient is from: Home              Anticipated d/c is to: Unclear at this time              Anticipated d/c date is: More than 3 days              Patient currently is not  medically stable to d/c.   Infusions:  . sodium chloride 20 mL/hr at 05/08/2020 2359  . sodium chloride Stopped (05/18/20 2218)  . ceFEPime (MAXIPIME) IV 2 g (05/26/20 0408)  . metronidazole 500 mg (05/26/20 0540)  . TPN ADULT (ION) 85 mL/hr at 05/26/20 0600  . TPN ADULT (ION)      Scheduled Meds: . sodium chloride   Intravenous Once  . Chlorhexidine Gluconate Cloth  6 each Topical Daily  . Gerhardt's butt cream   Topical BID  . heparin injection (subcutaneous)  5,000 Units Subcutaneous Q8H  . lactulose  30 g Oral Once  . mouth rinse  15 mL Mouth Rinse BID  . pantoprazole (PROTONIX) IV  40 mg Intravenous Q24H    Antimicrobials: Anti-infectives (From admission, onward)   Start     Dose/Rate Route Frequency Ordered Stop   05/22/20 1600  ceFEPIme (MAXIPIME) 2 g in sodium chloride 0.9 % 100 mL IVPB        2 g 200 mL/hr over 30 Minutes Intravenous Every 12 hours 05/22/20 1459     05/18/20 1600  vancomycin (VANCOREADY) IVPB 750 mg/150 mL  Status:  Discontinued        750 mg 150 mL/hr over 60 Minutes Intravenous Every 24 hours 05/03/2020 1609 05/19/20 1024   05/18/20 1600  ceFEPIme (MAXIPIME) 2 g in sodium chloride 0.9 % 100 mL IVPB  Status:  Discontinued        2 g 200 mL/hr over 30 Minutes Intravenous Every 24 hours 05/20/2020 1609 05/18/2020 1610   04/29/2020 2230  metroNIDAZOLE (FLAGYL) IVPB 500 mg        500 mg 100 mL/hr over 60 Minutes Intravenous Every 8 hours 05/16/2020 2223     05/09/2020 1615  ceFEPIme (MAXIPIME) 2 g in sodium chloride 0.9 % 100 mL IVPB  Status:  Discontinued        2 g 200 mL/hr over 30 Minutes Intravenous Every 24 hours 05/13/2020 1610 05/22/20 1459   04/28/2020 1545  vancomycin (VANCOREADY) IVPB 1500 mg/300 mL        1,500 mg 150 mL/hr over 120 Minutes Intravenous  Once 05/10/2020 1544 05/18/20 0234   05/05/2020 1545  ceFEPIme (MAXIPIME) 2 g in sodium chloride 0.9 % 100 mL IVPB  Status:  Discontinued        2 g 200 mL/hr over 30 Minutes Intravenous  Once 04/29/2020 1544  05/18/2020 1610   05/17/2020 0600  cefoTEtan (CEFOTAN) 2 g in sodium chloride 0.9 % 100 mL IVPB        2 g 200 mL/hr over 30 Minutes Intravenous On call to O.R.  05/05/20 1533 05/15/2020 1106   05/05/20 1600  amoxicillin (AMOXIL) capsule 500 mg        500 mg Oral Every 12 hours 05/05/20 1447 05/10/20 2159      PRN meds: Place/Maintain arterial line **AND** sodium chloride, acetaminophen **OR** acetaminophen, haloperidol lactate, ondansetron **OR** ondansetron (ZOFRAN) IV, oxyCODONE, phenol   Objective: Vitals:   05/26/20 1150 05/26/20 1200  BP:  (!) 168/72  Pulse: (!) 55 73  Resp: 18 17  Temp:  99.1 F (37.3 C)  SpO2: 95% 94%    Intake/Output Summary (Last 24 hours) at 05/26/2020 1321 Last data filed at 05/26/2020 0747 Gross per 24 hour  Intake 2340.77 ml  Output 2530 ml  Net -189.23 ml   Filed Weights   05/25/20 0500 05/26/20 0500 05/26/20 0750  Weight: 79.5 kg 78.6 kg 78.6 kg   Weight change: -0.9 kg Body mass index is 29.73 kg/m.   Physical Exam: General exam: Opens eyes on verbal command.  Not in physical distress. On Venti mask.  Skin: No rashes, lesions or ulcers. HEENT: Atraumatic, normocephalic, supple neck, no obvious bleeding Lungs: Clear to auscultation bilaterally CVS: Heart rate regulated.  Systolic ejection murmur. GI/Abd: distended, bowel sounds sluggish.  JP drain in place.  Abdominal binder in place. CNS: slowly improving mental status but overall poor porgnosis. Psychiatry: Obtunded Extremities: 1+ pedal edema bilaterally  Data Review: I have personally reviewed the laboratory data and studies available.  Recent Labs  Lab 05/20/20 0030 05/20/20 0030 05/21/20 0337 05/22/20 0356 05/23/20 0337 05/24/20 0435 05/25/20 0430  WBC 9.3   < > 10.0 10.9* 10.6* 15.4* 9.5  NEUTROABS 8.1*  --   --   --   --  12.5*  --   HGB 9.6*   < > 9.9* 10.3* 10.1* 11.0* 9.7*  HCT 30.7*   < > 32.5* 33.6* 34.0* 37.3 34.5*  MCV 92.7   < > 93.9 94.6 97.4 99.5 99.7  PLT 160    < > 137* 109* 83* 91* 69*   < > = values in this interval not displayed.   Recent Labs  Lab 05/20/20 0030 05/21/20 0337 05/22/20 0356 05/23/20 0337 05/24/20 0435 05/25/20 0430 05/26/20 0437  NA 143   < > 143 145 147* 149* 145  K 3.7   < > 4.0 3.9 4.6 4.7 4.7  CL 105   < > 109 110 114* 118* 116*  CO2 28   < > 30 28 27 25 24   GLUCOSE 120*   < > 120* 137* 121* 120* 124*  BUN 56*   < > 44* 44* 51* 49* 50*  CREATININE 1.95*   < > 1.26* 1.06* 1.02* 0.89 0.83  CALCIUM 8.6*   < > 8.5* 8.2* 8.7* 8.7* 8.8*  MG 2.1  --   --  1.5* 2.2  --  1.8  PHOS 4.6   < > 2.7 3.1 2.7 2.8 2.7   < > = values in this interval not displayed.    F/u labs ordered  Signed, Terrilee Croak, MD [facility] 05/26/2020

## 2020-05-26 NOTE — Plan of Care (Signed)
  Problem: Education: Goal: Knowledge of General Education information will improve Description: Including pain rating scale, medication(s)/side effects and non-pharmacologic comfort measures Outcome: Progressing   Problem: Health Behavior/Discharge Planning: Goal: Ability to manage health-related needs will improve Outcome: Progressing   Problem: Clinical Measurements: Goal: Ability to maintain clinical measurements within normal limits will improve Outcome: Progressing Goal: Will remain free from infection Outcome: Progressing Goal: Diagnostic test results will improve Outcome: Progressing Goal: Respiratory complications will improve Outcome: Progressing Goal: Cardiovascular complication will be avoided Outcome: Progressing   Problem: Activity: Goal: Risk for activity intolerance will decrease Outcome: Progressing   Problem: Nutrition: Goal: Adequate nutrition will be maintained Outcome: Progressing   Problem: Coping: Goal: Level of anxiety will decrease Outcome: Progressing   Problem: Elimination: Goal: Will not experience complications related to bowel motility Outcome: Progressing Goal: Will not experience complications related to urinary retention Outcome: Progressing   Problem: Pain Managment: Goal: General experience of comfort will improve Outcome: Progressing   Problem: Safety: Goal: Ability to remain free from injury will improve Outcome: Progressing   Problem: Skin Integrity: Goal: Risk for impaired skin integrity will decrease Outcome: Progressing   Problem: Education: Goal: Understanding of discharge needs will improve Outcome: Progressing Goal: Verbalization of understanding of the causes of altered bowel function will improve Outcome: Progressing   Problem: Activity: Goal: Ability to tolerate increased activity will improve Outcome: Progressing   Problem: Bowel/Gastric: Goal: Gastrointestinal status for postoperative course will  improve Outcome: Progressing   Problem: Health Behavior/Discharge Planning: Goal: Identification of community resources to assist with postoperative recovery needs will improve Outcome: Progressing   Problem: Nutritional: Goal: Will attain and maintain optimal nutritional status will improve Outcome: Progressing   Problem: Clinical Measurements: Goal: Postoperative complications will be avoided or minimized Outcome: Progressing   Problem: Respiratory: Goal: Respiratory status will improve Outcome: Progressing   Problem: Skin Integrity: Goal: Will show signs of wound healing Outcome: Progressing   

## 2020-05-26 NOTE — Progress Notes (Signed)
Patient ID: Regina Richards, female   DOB: 1945/12/30, 74 y.o.   MRN: [id number]  This NP visited patient at the bedside as a follow up for palliative medicine needs and emotional support and to meet with family for continued conversation regarding GOCs, EOL wishes and options.  Meet with patient's  four sisters at the bedside  to discuss diagnosis, prognosis, GOC, EOL wishes disposition and options.   Concept of Palliative Care was introduced as specialized medical care for people and their families living with serious illness.  If focuses on providing relief from the symptoms and stress of a serious illness.  The goal is to improve quality of life for both the patient and the family.   A  discussion was had today regarding advanced directives.  Concepts specific to code status, artifical feeding and hydration, continued IV antibiotics and rehospitalization was had.    The difference between a aggressive medical intervention path  and a palliative comfort care path for this patient at this time was had.    Values and goals of care important to patient and family were attempted to be elicited.  All understand the limited prognosis and are comfortable with decision for a comfort path knowing this is what the patient would want for herself   MOST form completed   Natural trajectory and expectations at EOL were discussed.  Questions and concerns addressed.     Plan of Care -Focus is comfort, quality and dignity-allowing for a natural death -DNR/DNI, no BiPap, no escalation of oxygen  -no artifical feeding or hydration now or in the future, dc TPN -no further diagnostics - minimize medications, medications only that  enhance comfort and reduce suffering      - Morphine 2 mg IV every 1 hr prn     -Ativan 2 mg IV every 4 hrs     -Lasix 40 mg IV one time      -Robinul  0.4 mg IV four times a day  -no TV in the room, play only music -chaplain consult -expect hospital death, prognosis is likely  hrs to days      Questions and concerns addressed   Discussed with Dr Pietro Cassis  Total time spent on the unit was 60 minutes  Greater than 50% of the time was spent in counseling and coordination of care  Wadie Lessen NP  Palliative Medicine Team Team Phone # [phone] Pager [phone]

## 2020-05-26 NOTE — Progress Notes (Signed)
   05/26/20 1500  Vitals  Pulse Rate 68  ECG Heart Rate 71  Resp (!) 21  Oxygen Therapy  SpO2 90 %  O2 Device Nasal Cannula  O2 Flow Rate (L/min) 3 L/min  MEWS Score  MEWS Temp 0  MEWS Systolic 0  MEWS Pulse 0  MEWS RR 1  MEWS LOC 1  MEWS Score 2  MEWS Score Color Yellow  changed to comfort care per [facility]

## 2020-05-27 DIAGNOSIS — R52 Pain, unspecified: Secondary | ICD-10-CM | POA: Diagnosis not present

## 2020-05-27 DIAGNOSIS — N179 Acute kidney failure, unspecified: Secondary | ICD-10-CM | POA: Diagnosis not present

## 2020-05-27 DIAGNOSIS — Z515 Encounter for palliative care: Secondary | ICD-10-CM | POA: Diagnosis not present

## 2020-05-27 DIAGNOSIS — R627 Adult failure to thrive: Secondary | ICD-10-CM | POA: Diagnosis not present

## 2020-05-27 LAB — MAGNESIUM: Magnesium: 2.1 mg/dL (ref 1.7–2.4)

## 2020-05-27 LAB — PHOSPHORUS: Phosphorus: 5.7 mg/dL — ABNORMAL HIGH (ref 2.5–4.6)

## 2020-05-28 NOTE — Progress Notes (Signed)
[facility] Progress Note  10 Days Post-Op  Subjective: Noted plan to transition to comfort care. Patient not responsive to verbal or tactile stimuli.   Objective: Vital signs in last 24 hours: Temp:  [96.7 F (35.9 C)-99.1 F (37.3 C)] 97.5 F (36.4 C) (09/30 0833) Pulse Rate:  [55-119] 90 (09/30 0833) Resp:  [14-21] 14 (09/30 0833) BP: (122-168)/(54-83) 122/67 (09/30 0833) SpO2:  [90 %-98 %] 90 % (09/30 0833) FiO2 (%):  [45 %] 45 % (09/29 1200) Last BM Date: 05/25/20  Intake/Output from previous day: 09/29 0701 - 09/30 0700 In: 981.4 [I.V.:240.7; IV Piggyback:740.7] Out: 1530 [Urine:1350; Drains:80; Stool:100] Intake/Output this shift: No intake/output data recorded.  PE: General: snoring, unresponsive Heart: regular, rate, and rhythm Lungs:rales bilaterally OVZ:CHYI,FOY-DXAJOI,NO drain with SS fluid (removed), stoma working    Lab Results:  Recent Labs    05/25/20 0430  WBC 9.5  HGB 9.7*  HCT 34.5*  PLT 69*   BMET Recent Labs    05/25/20 0430 05/26/20 0437  NA 149* 145  K 4.7 4.7  CL 118* 116*  CO2 25 24  GLUCOSE 120* 124*  BUN 49* 50*  CREATININE 0.89 0.83  CALCIUM 8.7* 8.8*   PT/INR No results for input(s): LABPROT, INR in the last 72 hours. CMP     Component Value Date/Time   NA 145 05/26/2020 0437   K 4.7 05/26/2020 0437   CL 116 (H) 05/26/2020 0437   CO2 24 05/26/2020 0437   GLUCOSE 124 (H) 05/26/2020 0437   BUN 50 (H) 05/26/2020 0437   CREATININE 0.83 05/26/2020 0437   CALCIUM 8.8 (L) 05/26/2020 0437   PROT 5.3 (L) 05/25/2020 0430   ALBUMIN 2.5 (L) 05/26/2020 0437   AST 32 05/25/2020 0430   ALT 16 05/25/2020 0430   ALKPHOS 129 (H) 05/25/2020 0430   BILITOT 0.7 05/25/2020 0430   GFRNONAA >60 05/26/2020 0437   GFRAA >60 05/26/2020 0437   Lipase  No results found for: LIPASE     Studies/Results: ECHOCARDIOGRAM LIMITED  Result Date: 05/25/2020    ECHOCARDIOGRAM LIMITED REPORT   Patient Name:   Regina Richards  Date of Exam: 05/25/2020 Medical Rec #:  [id number]     Height:       64.0 in Accession #:    [id number]    Weight:       175.3 lb Date of Birth:  03/20/1946     BSA:          1.850 m Patient Age:    74 years      BP:           154/61 mmHg Patient Gender: F             HR:           91 bpm. Exam Location:  Inpatient Procedure: 2D Echo Indications:    murmur 785.2  History:        Patient has prior history of Echocardiogram examinations, most                 recent 05/01/2020. Risk Factors:Hypertension.  Sonographer:    Jannett Celestine RDCS (AE) Referring Phys: [id number] [facility]  Sonographer Comments: No apical window and suboptimal subcostal window. limited mobility. IMPRESSIONS  1. Limited study due to poor windows, poor patient mobility - off axis images  2. Left ventricular ejection fraction, by estimation, is 70 to 75%. The left ventricle has hyperdynamic function.  3. The mitral valve is grossly normal. Trivial  mitral valve regurgitation.  4. The aortic valve is tricuspid. Aortic valve regurgitation is mild. Mild aortic valve sclerosis is present, with no evidence of aortic valve stenosis. Aortic valve mean gradient measures 13.0 mmHg. The aortic valve leaflet excursion appears normal.  5. There is moderately elevated pulmonary artery systolic pressure. The estimated right ventricular systolic pressure is 70.6 mmHg.  6. The inferior vena cava is normal in size with greater than 50% respiratory variability, suggesting right atrial pressure of 3 mmHg. FINDINGS  Left Ventricle: Left ventricular ejection fraction, by estimation, is 70 to 75%. The left ventricle has hyperdynamic function. Right Ventricle: There is moderately elevated pulmonary artery systolic pressure. The tricuspid regurgitant velocity is 3.30 m/s, and with an assumed right atrial pressure of 3 mmHg, the estimated right ventricular systolic pressure is 23.7 mmHg. Mitral Valve: The mitral valve is grossly normal. Trivial mitral valve regurgitation.  Tricuspid Valve: The tricuspid valve is grossly normal. Tricuspid valve regurgitation is trivial. Aortic Valve: The aortic valve is tricuspid. Aortic valve regurgitation is mild. Mild aortic valve sclerosis is present, with no evidence of aortic valve stenosis. Aortic valve mean gradient measures 13.0 mmHg. Aortic valve peak gradient measures 20.8 mmHg. Venous: The inferior vena cava is normal in size with greater than 50% respiratory variability, suggesting right atrial pressure of 3 mmHg. AORTIC VALVE AV Vmax:           228.00 cm/s AV Vmean:          174.000 cm/s AV VTI:            0.418 m AV Peak Grad:      20.8 mmHg AV Mean Grad:      13.0 mmHg LVOT Vmax:         149.00 cm/s LVOT Vmean:        114.000 cm/s LVOT VTI:          0.313 m LVOT/AV VTI ratio: 0.75 TRICUSPID VALVE TR Peak grad:   43.6 mmHg TR Vmax:        330.00 cm/s  SHUNTS Systemic VTI: 0.31 m Lyman Bishop MD Electronically signed by Lyman Bishop MD Signature Date/Time: 05/25/2020/12:16:45 PM    Final     Anti-infectives: Anti-infectives (From admission, onward)   Start     Dose/Rate Route Frequency Ordered Stop   05/22/20 1600  ceFEPIme (MAXIPIME) 2 g in sodium chloride 0.9 % 100 mL IVPB  Status:  Discontinued        2 g 200 mL/hr over 30 Minutes Intravenous Every 12 hours 05/22/20 1459 05/26/20 1505   05/18/20 1600  vancomycin (VANCOREADY) IVPB 750 mg/150 mL  Status:  Discontinued        750 mg 150 mL/hr over 60 Minutes Intravenous Every 24 hours 05/01/2020 1609 05/19/20 1024   05/18/20 1600  ceFEPIme (MAXIPIME) 2 g in sodium chloride 0.9 % 100 mL IVPB  Status:  Discontinued        2 g 200 mL/hr over 30 Minutes Intravenous Every 24 hours 05/10/2020 1609 05/15/2020 1610   05/19/2020 2230  metroNIDAZOLE (FLAGYL) IVPB 500 mg  Status:  Discontinued        500 mg 100 mL/hr over 60 Minutes Intravenous Every 8 hours 05/16/2020 2223 05/26/20 1505   05/14/2020 1615  ceFEPIme (MAXIPIME) 2 g in sodium chloride 0.9 % 100 mL IVPB  Status:  Discontinued         2 g 200 mL/hr over 30 Minutes Intravenous Every 24 hours 05/02/2020 1610 05/22/20 1459  05/07/2020 1545  vancomycin (VANCOREADY) IVPB 1500 mg/300 mL        1,500 mg 150 mL/hr over 120 Minutes Intravenous  Once 05/22/2020 1544 05/18/20 0234   05/10/2020 1545  ceFEPIme (MAXIPIME) 2 g in sodium chloride 0.9 % 100 mL IVPB  Status:  Discontinued        2 g 200 mL/hr over 30 Minutes Intravenous  Once 05/06/2020 1544 05/14/2020 1610   05/08/2020 0600  cefoTEtan (CEFOTAN) 2 g in sodium chloride 0.9 % 100 mL IVPB        2 g 200 mL/hr over 30 Minutes Intravenous On call to O.R. 05/05/20 1533 05/14/2020 1106   05/05/20 1600  amoxicillin (AMOXIL) capsule 500 mg        500 mg Oral Every 12 hours 05/05/20 1447 05/10/20 2159       Assessment/Plan Anxiety Cirrhosis  ABLAnemia  AKI  Protein calorie malnutrition  Acute metabolic encephalopathy Acute Hypercarbic respiratory failure  Colon mass- adenocarcinoma 4.5 cm 0/32 lymph nodes T3,N0 S/P left hemicolectomy and liver biopsy9/9 Dr. Georgette Dover Pneumoperitoneumon POD #11 despite normal bowel function S/p ex-lap, LOA, transverse loop colostomy with drain placement 9/20 Dr. Kae Heller -POD#21/10 -patient transitioning to comfort care - JP removed at bedside - daily dressing changes  FEN: soft  VTE: SQ heparin  ID: flagyl 9/20>9/29, cefepime 9/25>9/29 Foley: none  Patient transitioning to comfort care. Surgery will remain available as needed. Change dressing once daily. JP removed. Ok to remove red-rubber bridge if colostomy pouch needs to be changed. Appreciate palliative care involvement.    LOS: 27 days    Norm Parcel , [facility] 2020/06/26, 8:37 AM Please see Amion for pager number during day hours 7:00am-4:30pm

## 2020-05-28 NOTE — Progress Notes (Signed)
PROGRESS NOTE  Regina Richards  DOB: September 05, 1945  PCP: Patient, No Pcp Per XLK:[id number]  DOA: 05/18/2020  LOS: 27 days   Chief Complaint  Patient presents with  . Loss of Consciousness   Brief narrative: Regina Richards is a 74 y.o. female with PMH of HTN, nephrolithiasis, anxiety.  Timeline of events  9/3, admitted with 3 to 4-week history of progressively worsening shortness of breath.  Hemoglobin on admission was noted to be low at 2.7.  9/6, underwent EGD and colonoscopy.  Found to have sigmoid colon mass, partially obstructing plus multiple polyps. 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 General surgery was consulted.   9/9, underwent left hemicolectomy as well as liver biopsy.    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 Negative for lymphovascular or perineural invasion.   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   9/20, developed worsening abdominal pain, shortness of breath and leukocytosis. Chest x-ray showed new pneumoperitoneum. Underwent exploratory laparotomy.  Noted to have large volume pneumoperitoneum, 5 L of ascites with phlegmonous change in the left upper quadrant and around the colonic anastomosis on the left side, extensive inflammatory adhesions. Area of anastomotic leak was identified, adhesions were lysed and transverse colostomy was created.  She required 750 cc of albumin and 2 L of crystalloid during the procedure.  No blood was given.  She required Neo-Synephrine during the procedure but was weaned off and transferred to the ICU intubated and sedated on propofol. PCCM was consulted for postop ICU management.  9/22, extubated. Started on TPN  9/24, transfer out of the hospitalist service  Patient continued to worsen.  Her mental status  declined.  She became BiPAP dependent.  Palliative care consultation was obtained.  Patient was made comfort care on 9/29.  Subjective: Patient was seen and examined this morning. Tries to open eyes on sternal rub.  Shallow breathing on Ventimask.  Assessment/Plan: 9/29, after family meeting, patient was made comfort care. -She looks comfortable at this time. -As needed medications per order set.  Issues addressed during this hospitalization. Adenocarcinoma of colon s/p left hemicolectomy Septic shock secondary to peritonitis Acute hypoxic/hypercapnic respiratory failure Acute toxic metabolic encephalopathy Acute on chronic blood loss anemia Iron deficiency anemia Acute kidney injury Hypokalemia/hypomagnesemi/hypophosphatemia Liver cirrhosis  Enterococcus faecalis UTI 9/5   Remains inpatient appropriate because: Comfort care status  Dispo: The patient is from: Home              Anticipated d/c is to: Likely to have in-hospital mortality             Infusions:  . sodium chloride 20 mL/hr at 05/25/2020 2359  . sodium chloride Stopped (05/18/20 2218)    Scheduled Meds: . sodium chloride   Intravenous Once  . Chlorhexidine Gluconate Cloth  6 each Topical Daily  . Gerhardt's butt cream   Topical BID  . glycopyrrolate  0.4 mg Intravenous QID  . lactulose  30 g Oral Once  . LORazepam  2 mg Intravenous Q4H  . mouth rinse  15 mL Mouth Rinse BID    Antimicrobials: Anti-infectives (From admission, onward)   Start     Dose/Rate Route Frequency Ordered Stop   05/22/20 1600  ceFEPIme (MAXIPIME) 2 g in sodium chloride 0.9 % 100 mL IVPB  Status:  Discontinued        2 g 200  mL/hr over 30 Minutes Intravenous Every 12 hours 05/22/20 1459 05/26/20 1505   05/18/20 1600  vancomycin (VANCOREADY) IVPB 750 mg/150 mL  Status:  Discontinued        750 mg 150 mL/hr over 60 Minutes Intravenous Every 24 hours 05/11/2020 1609 05/19/20 1024   05/18/20 1600  ceFEPIme (MAXIPIME) 2 g in sodium  chloride 0.9 % 100 mL IVPB  Status:  Discontinued        2 g 200 mL/hr over 30 Minutes Intravenous Every 24 hours 05/10/2020 1609 04/30/2020 1610   05/04/2020 2230  metroNIDAZOLE (FLAGYL) IVPB 500 mg  Status:  Discontinued        500 mg 100 mL/hr over 60 Minutes Intravenous Every 8 hours 05/15/2020 2223 05/26/20 1505   05/16/2020 1615  ceFEPIme (MAXIPIME) 2 g in sodium chloride 0.9 % 100 mL IVPB  Status:  Discontinued        2 g 200 mL/hr over 30 Minutes Intravenous Every 24 hours 05/24/2020 1610 05/22/20 1459   05/02/2020 1545  vancomycin (VANCOREADY) IVPB 1500 mg/300 mL        1,500 mg 150 mL/hr over 120 Minutes Intravenous  Once 05/08/2020 1544 05/18/20 0234   05/16/2020 1545  ceFEPIme (MAXIPIME) 2 g in sodium chloride 0.9 % 100 mL IVPB  Status:  Discontinued        2 g 200 mL/hr over 30 Minutes Intravenous  Once 05/14/2020 1544 05/15/2020 1610   04/29/2020 0600  cefoTEtan (CEFOTAN) 2 g in sodium chloride 0.9 % 100 mL IVPB        2 g 200 mL/hr over 30 Minutes Intravenous On call to O.R. 05/05/20 1533 04/28/2020 1106   05/05/20 1600  amoxicillin (AMOXIL) capsule 500 mg        500 mg Oral Every 12 hours 05/05/20 1447 05/10/20 2159      PRN meds: Place/Maintain arterial line **AND** sodium chloride, acetaminophen **OR** acetaminophen, haloperidol lactate, morphine injection, ondansetron **OR** ondansetron (ZOFRAN) IV, phenol, sodium chloride flush   Objective: Vitals:   2020/06/17 0847 2020-06-17 0908  BP:    Pulse: (!) 123 (!) 120  Resp:    Temp:    SpO2: (!) 89% (!) 73%    Intake/Output Summary (Last 24 hours) at 06-17-2020 1009 Last data filed at 06/17/2020 0600 Gross per 24 hour  Intake 740.73 ml  Output 1520 ml  Net -779.27 ml   Filed Weights   05/25/20 0500 05/26/20 0500 05/26/20 0750  Weight: 79.5 kg 78.6 kg 78.6 kg   Weight change: 0 kg Body mass index is 29.73 kg/m.   Physical Exam: General exam: Shallow breathing on Ventimask.  Looks comfortable otherwise. Detailed examination not done  because of complicated status  Signed, Terrilee Croak, MD [facility] 06-17-20

## 2020-05-28 NOTE — Progress Notes (Signed)
Patient ID: Regina Richards, female   DOB: 1946-04-11, 74 y.o.   MRN: [id number]  This NP visited patient at the bedside as a follow up for palliative medicine needs and emotional support.   Patient is minimally responsive, she appears comfortable on current medication regiment.  Placed call to family and spoke to patient's sister Regina Richards regarding current medical situation.  She tells me that she and her family are all comfortable with decision to shift to comfort and that they hope for peace and dignity for their sister.  Education offered on the natural trajectory and expectations at end of life.  Questions and concerns were addressed.  All understand the limited prognosis, likely hours to a day.    Expect a hospital death.    Plan of Care -Focus is comfort, quality and dignity-allowing for a natural death -DNR/DNI, no BiPap, no escalation of oxygen  -no artifical feeding or hydration now or in the future, dc TPN -no further diagnostics - minimize medications, medications only that  enhance comfort and reduce suffering      - Morphine 2 mg IV every 1 hr prn     -Ativan 2 mg IV every 4 hrs     -Lasix 40 mg IV one time      -Robinul  0.4 mg IV four times a day  -no TV in the room, play only music -chaplain consult -expect hospital death      Questions and concerns addressed   Discussed with Dr Pietro Cassis  Total time spent on the unit was 35 minutes  Greater than 50% of the time was spent in counseling and coordination of care  Wadie Lessen NP  Palliative Medicine Team Team Phone # [phone] Pager [phone]

## 2020-05-28 NOTE — Progress Notes (Signed)
Nutrition Brief Note  Chart reviewed. Pt now transitioning to comfort care.  No further nutrition interventions warranted at this time.  Please re-consult as needed.    Gaynell Face, MS, RD, LDN Inpatient Clinical Dietitian Please see [facility] for contact information.

## 2020-05-28 NOTE — Progress Notes (Signed)
   06-22-2020 1115  Clinical Encounter Type  Visited With Patient;Health care provider  Visit Type Initial;Spiritual support  Referral From Palliative care team  Consult/Referral To Chaplain  Spiritual Encounters  Spiritual Needs Prayer  This chaplain responded to PMT consult for EOL spiritual care.  The Pt. appears to be resting comfortably at time of chaplain visit.  A moment of silence and prayer was shared with the Pt.   The chaplain noted the peaceful music in the background and is appreciative of the RN-Carole updates and staff support. The chaplain understands family communication is occurring as needed.  F/U spiritual care was offered through the RN for the family as needed.

## 2020-05-28 NOTE — Progress Notes (Addendum)
Pt being transported to the morgue. Belongings sent with family clothing and cellphone.

## 2020-05-28 NOTE — Progress Notes (Signed)
This chaplain responded to page from RN-Carole for family spiritual care at the time Pt. death.  The chaplain was pastorally present with the Pt. four sisters. The chaplain guided them through the process of disposition of the Pt. body and collecting the Pt. personal belongings before the family exited.

## 2020-05-28 NOTE — Plan of Care (Signed)
  Problem: Education: Goal: Knowledge of General Education information will improve Description: Including pain rating scale, medication(s)/side effects and non-pharmacologic comfort measures Outcome: Progressing   Problem: Health Behavior/Discharge Planning: Goal: Ability to manage health-related needs will improve Outcome: Progressing   Problem: Clinical Measurements: Goal: Ability to maintain clinical measurements within normal limits will improve Outcome: Progressing Goal: Will remain free from infection Outcome: Progressing Goal: Diagnostic test results will improve Outcome: Progressing Goal: Respiratory complications will improve Outcome: Progressing Goal: Cardiovascular complication will be avoided Outcome: Progressing   Problem: Activity: Goal: Risk for activity intolerance will decrease Outcome: Progressing   Problem: Nutrition: Goal: Adequate nutrition will be maintained Outcome: Progressing   Problem: Coping: Goal: Level of anxiety will decrease Outcome: Progressing   Problem: Elimination: Goal: Will not experience complications related to bowel motility Outcome: Progressing Goal: Will not experience complications related to urinary retention Outcome: Progressing   Problem: Pain Managment: Goal: General experience of comfort will improve Outcome: Progressing   Problem: Safety: Goal: Ability to remain free from injury will improve Outcome: Progressing   Problem: Skin Integrity: Goal: Risk for impaired skin integrity will decrease Outcome: Progressing   Problem: Education: Goal: Understanding of discharge needs will improve Outcome: Progressing Goal: Verbalization of understanding of the causes of altered bowel function will improve Outcome: Progressing   Problem: Activity: Goal: Ability to tolerate increased activity will improve Outcome: Progressing   Problem: Bowel/Gastric: Goal: Gastrointestinal status for postoperative course will  improve Outcome: Progressing   Problem: Health Behavior/Discharge Planning: Goal: Identification of community resources to assist with postoperative recovery needs will improve Outcome: Progressing   Problem: Nutritional: Goal: Will attain and maintain optimal nutritional status will improve Outcome: Progressing   Problem: Clinical Measurements: Goal: Postoperative complications will be avoided or minimized Outcome: Progressing   Problem: Respiratory: Goal: Respiratory status will improve Outcome: Progressing   Problem: Skin Integrity: Goal: Will show signs of wound healing Outcome: Progressing   Problem: Education: Goal: Knowledge of the prescribed therapeutic regimen will improve Outcome: Progressing   Problem: Coping: Goal: Ability to identify and develop effective coping behavior will improve Outcome: Progressing   Problem: Clinical Measurements: Goal: Quality of life will improve Outcome: Progressing   Problem: Respiratory: Goal: Verbalizations of increased ease of respirations will increase Outcome: Progressing   Problem: Role Relationship: Goal: Family's ability to cope with current situation will improve Outcome: Progressing Goal: Ability to verbalize concerns, feelings, and thoughts to partner or family member will improve Outcome: Progressing   Problem: Pain Management: Goal: Satisfaction with pain management regimen will improve Outcome: Progressing

## 2020-05-28 NOTE — Progress Notes (Signed)
Pt passed confirmed time of deat at 1344 by [place] rn and Norvil Martensen rn

## 2020-05-28 DEATH — deceased

## 2020-06-09 LAB — SURGICAL PATHOLOGY

## 2020-06-15 ENCOUNTER — Ambulatory Visit: Payer: Medicare Other | Admitting: Cardiology

## 2020-06-28 NOTE — Death Summary Note (Signed)
DEATH SUMMARY   Patient Details  Name: Regina Richards MRN: [id number] DOB: 02/01/1946  Admission/Discharge Information   Admit Date:  05/15/2020  Date of Death: Date of Death: June 11, 2020  Time of Death: Time of Death: 1342-12-03  Length of Stay: 12-07-2022  Referring Physician: Patient, No Pcp Per   Reason(s) for Hospitalization   Worsening shortness of breath   Diagnoses  Preliminary cause of death:   Acute respiratory failure with hypoxia and hypercapnia  Secondary Diagnoses (including complications and co-morbidities):   Acute metabolic encephalopathy Septic shock  Principal Problem:   AKI (acute kidney injury) ([place]) Active Problems:   Elevated blood pressure reading   Symptomatic anemia   Pancytopenia ([place])   History of kidney stones   Thrombocytopenia (HCC)   Iron deficiency anemia   Adenocarcinoma of colon, 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 female with PMH of HTN, nephrolithiasis, anxiety.  Timeline of events  16-May-2023, admitted with 3 to 4-week history of progressively worsening shortness of breath.  Hemoglobin on admission was noted to be low at 2.7.  9/6, underwent EGD and colonoscopy.  Found to have sigmoid colon mass, partially obstructing plus multiple polyps. 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 General surgery was consulted.   9/9, underwent left hemicolectomy as well as liver biopsy.    Pathology showed invasive moderately differentiated  adenocarcinoma, focally invading into pericolonic soft tissue, resection margins negative for dysplasia or carcinoma. Negative for lymphovascular or perineural invasion.   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   9/20, developed worsening abdominal pain, shortness of breath and leukocytosis. Chest x-ray showed new pneumoperitoneum. Underwent exploratory laparotomy.  Noted to have large volume pneumoperitoneum, 5 L of ascites with phlegmonous change in the left upper quadrant and around the colonic anastomosis on the left side, extensive inflammatory adhesions. Area of anastomotic leak was identified, adhesions were lysed and transverse colostomy was created. She required 750 cc of albumin and 2 L of crystalloid during the procedure. No blood was given. She required Neo-Synephrine during the procedure but was weaned off and transferred to the ICU intubated and sedated on propofol. PCCM was consulted for postop ICU management.  9/22, extubated. Started on TPN  9/24, transfer out of the hospitalist service  Patient continued to worsen.  Her mental status declined.  She became BiPAP dependent.  Palliative care consultation was obtained.  Patient was made comfort care on 9/29.  Pertinent Labs and Studies  Significant Diagnostic Studies CT ABDOMEN PELVIS W WO CONTRAST  Result Date: 05/04/2020 CLINICAL DATA:  Sigmoid colon mass at colonoscopy. EXAM: CT ABDOMEN AND PELVIS WITHOUT AND WITH CONTRAST TECHNIQUE: Multidetector CT imaging of the abdomen and pelvis was performed following the standard protocol before and following the bolus administration of intravenous contrast. CONTRAST:  193mL OMNIPAQUE IOHEXOL 300 MG/ML  SOLN COMPARISON:  05/01/2020 abdominal ultrasound. The most recent CT 08/07/2016. FINDINGS: Lower chest: Dependent right lower lobe atelectasis. Normal heart size with LAD coronary  artery calcification. Tiny right and trace left pleural effusions. Hepatobiliary: Subtly irregular  hepatic capsule with caudate lobe enlargement. No focal liver lesion. Normal gallbladder, without biliary ductal dilatation. Pancreas: Normal, without mass or ductal dilatation. Spleen: Normal in size, without focal abnormality. Adrenals/Urinary Tract: Normal adrenal glands. Bilateral renal collecting system calculi on the order of 3 mm and less. Right extrarenal pelvis which contains a dependent 3 mm stone. Normal ureters and urinary bladder Stomach/Bowel: Normal stomach, without wall thickening. The rectum and sigmoid are underdistended, but no dominant sigmoid mass is seen. There is soft tissue thickening in the distal descending colon, including on 61/4, which could represent the site of primary. The descending colon appears thick walled more proximally on 52/4, possibly due to underdistention. Normal terminal ileum and appendix. Normal small bowel. Vascular/Lymphatic: Aortic atherosclerosis. No abdominopelvic adenopathy. Reproductive: Normal uterus and adnexa. Other: Small volume abdominopelvic ascites. No evidence of omental or peritoneal disease. Musculoskeletal: Convex right lumbar spine curvature. Advanced lumbosacral spondylosis. IMPRESSION: 1. Wall thickening focally involving the distal descending colon, most likely the site of primary. No dominant sigmoid mass identified. 2. No findings of metastatic disease within the abdomen or pelvis. 3. Bilateral pleural effusions and ascites, possibly representing fluid overload. 4. Hepatic morphology which suggests cirrhosis. Correlate with risk factors. 5. Bilateral nephrolithiasis. 6. Coronary artery atherosclerosis. Aortic Atherosclerosis (ICD10-I70.0). Electronically Signed   By: Abigail Miyamoto M.D.   On: 05/12/2020 14:52   DG Chest 1 View  Result Date: 05/18/2020 CLINICAL DATA:  Central line placement EXAM: CHEST  1 VIEW COMPARISON:  05/04/2020 FINDINGS: Interval placement of left neck vascular catheter, tip projecting over the superior portion of the SVC.  Otherwise unchanged AP portable examination with layering bilateral pleural effusions, endotracheal tube, esophagogastric tube. IMPRESSION: 1. Interval placement of left neck vascular catheter, tip projecting over the superior portion of the SVC. 2.  Otherwise unchanged AP portable examination. Electronically Signed   By: Eddie Candle M.D.   On: 05/18/2020 11:45   DG Chest 2 View  Result Date: 05/09/2020 CLINICAL DATA:  Fever today; underwent exploratory laparotomy and partial colectomy on 04/28/2020 EXAM: CHEST - 2 VIEW COMPARISON:  I 321 FINDINGS: Enlargement of cardiac silhouette with slight vascular congestion. Mediastinal contours normal. Bibasilar pleural effusions and atelectasis greater on RIGHT. Elevation of RIGHT diaphragm. Free air under the RIGHT diaphragm and outlining a bowel loop in the mid abdomen, likely related to exploratory laparotomy 3 days ago. Upper lungs clear. No pneumothorax or acute osseous findings. IMPRESSION: Bibasilar effusions and atelectasis. Elevated RIGHT diaphragm with free intraperitoneal air likely related to recent laparotomy 3 days ago. Electronically Signed   By: Lavonia Dana M.D.   On: 05/09/2020 15:48   DG Abd 1 View  Result Date: 05/20/2020 CLINICAL DATA:  Evaluate nasogastric tube placement. EXAM: ABDOMEN - 1 VIEW COMPARISON:  May 11, 2020 FINDINGS: Interval insertion of gastric decompression tube with the tip near the gastric antrum. Nonobstructive bowel gas pattern. Similar appearing marked dextroconvex lumbar scoliotic curvature. IMPRESSION: Gastric tube tip and proximal side hole are in the gastric antrum. Electronically Signed   By: Ruthann Cancer MD   On: 05/20/2020 15:45   CT Head Wo Contrast  Result Date: 05/23/2020 CLINICAL DATA:  Provided history: Syncope, simple, normal neuro exam, seizure-like activity. EXAM: CT HEAD WITHOUT CONTRAST TECHNIQUE: Contiguous axial images were obtained from the base of the skull through the vertex without  intravenous contrast. COMPARISON:  Head CT 10/05/2015. FINDINGS: Brain: Mildly motion degraded examination. Mild generalized  parenchymal atrophy, progressed as compared to the head CT of 08/03/2016. Redemonstrated chronic cortically based infarct within the left frontal lobe in the left frontal operculum. Stable mild ill-defined hypoattenuation within the cerebral white matter which is nonspecific, but consistent with chronic small vessel ischemic disease. There is no acute intracranial hemorrhage. No acute demarcated cortical infarct. No extra-axial fluid collection. No evidence of intracranial mass. No midline shift. Vascular: No hyperdense vessel. Skull: Normal. Negative for fracture or focal lesion. Sinuses/Orbits: Visualized orbits show no acute finding. Mild mucosal thickening within the inferior right maxillary sinus. No significant mastoid effusion. IMPRESSION: No CT evidence of acute intracranial abnormality. 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 Stable mild cerebral white matter chronic small vessel ischemic disease. Mild generalized parenchymal atrophy, progressed as compared to the prior CT of 08/03/2016. Mild right maxillary sinus mucosal thickening. Electronically Signed   By: Kellie Simmering DO   On: 05/13/2020 19:08   US Abdomen Complete  Result Date: 05/01/2020 CLINICAL DATA:  Thrombocytopenia, nephrolithiasis EXAM: ABDOMEN ULTRASOUND COMPLETE COMPARISON:  None. FINDINGS: Gallbladder: The gallbladder is partially decompressed, but is otherwise unremarkable. No intraluminal stones or sludge is identified. Common bile duct: Diameter: 5 mm in proximal diameter Liver: No focal lesion identified. Within normal limits in parenchymal echogenicity. Portal vein is patent on color Doppler imaging with normal direction of blood flow towards the liver. IVC: No abnormality visualized. Pancreas: Visualized portion unremarkable. Spleen: The spleen is mildly enlarged measuring 13.7 cm  in greatest dimension. No intrasplenic lesions are seen. Right Kidney: Length: 10.9 cm. Cortical thickness is preserved and cortical echogenicity is normal. 9 mm and 6 mm nonobstructing calculi are seen within the lower pole of the right kidney. No hydronephrosis. No solid intrarenal mass identified. Left Kidney: Length: 9.3 cm. Echogenicity within normal limits. No mass or hydronephrosis visualized. Abdominal aorta: No aneurysm visualized. Other findings: Small bilateral pleural effusions are identified. IMPRESSION: 1. Small bilateral pleural effusions. 2. Mild splenomegaly. 3. Nonobstructing right nephrolithiasis. Electronically Signed   By: Fidela Salisbury MD   On: 05/01/2020 21:30   US RENAL  Result Date: 05/11/2020 CLINICAL DATA:  Acute kidney injury. EXAM: RENAL / URINARY TRACT ULTRASOUND COMPLETE COMPARISON:  CT 05/19/2020 FINDINGS: Right Kidney: Renal measurements: 10.9 x 5.9 x 6.5 cm = volume: 218 mL. Normal echogenicity. No hydronephrosis. Multiple small renal calculi, the largest measured at 9 mm. Left Kidney: Renal measurements: 9.2 x 5.9 x 3.9 cm = volume: 111 mL. Generalized volume loss. Multiple small calculi, the largest in the lower pole measuring 5-6 mm. No hydronephrosis. Bladder: Appears normal for degree of bladder distention. Other: Small amount of ascites is present. IMPRESSION: 1. No hydronephrosis. Multiple small bilateral renal calculi. 2. Left renal atrophy. 3. Small amount of ascites. Electronically Signed   By: Nelson Chimes M.D.   On: 05/11/2020 10:39   DG CHEST PORT 1 VIEW  Result Date: 05/23/2020 CLINICAL DATA:  Shortness of breath and chest pain. EXAM: PORTABLE CHEST 1 VIEW COMPARISON:  05/18/2020 and prior radiographs FINDINGS: The endotracheal tube and NG tube have been removed. A LEFT IJ central venous catheter with tip overlying the UPPER SVC again noted. This is a low volume film. Improved bibasilar atelectasis noted. There is no evidence of pneumothorax. Cardiomediastinal  silhouette is unchanged. IMPRESSION: Endotracheal tube and NG tube removal with improved bibasilar atelectasis. Electronically Signed   By: Margarette Canada M.D.   On: 05/23/2020 08:37   DG CHEST PORT 1 VIEW  Result Date: 05/01/2020 CLINICAL DATA:  Intubation EXAM: PORTABLE CHEST 1 VIEW COMPARISON:  Chest x-ray 05/10/2020 FINDINGS: Interval placement of an endotracheal tube with tip terminating 2 cm above the carina. Suggestion of an enteric tube noted coursing below the diaphragm with side port collimated off view. The heart size and mediastinal contours are unchanged. Low lung volumes with compressive changes bibasilar early. Biapical pleural/pulmonary scarring. No pulmonary edema. Trace left pleural effusion. Possible trace right pleural effusion. No pneumothorax. Suggestion of pneumoperitoneum under the right hemidiaphragm. No acute osseous abnormality. IMPRESSION: 1. Interval placement endotracheal tube with tip terminating 2 cm above the carina. 2. Suggestion of an enteric tube coursing below diaphragm with side port collimated off view. 3. Low lung volumes with bilateral trace pleural effusions, left greater than right. 4. Suggestion of pneumoperitoneum which is consistent with patient's postsurgical state status post exploratory laparotomy. Electronically Signed   By: Iven Finn M.D.   On: 05/09/2020 23:26   DG Chest Port 1 View  Result Date: 05/12/2020 CLINICAL DATA:  Colon cancer status post recent colectomy, severe abdominal pain, shortness of breath EXAM: PORTABLE CHEST 1 VIEW COMPARISON:  05/09/2020 FINDINGS: Upright frontal view of the chest demonstrates interval development of pneumoperitoneum most pronounced under the right hemidiaphragm. Lung volumes are diminished, with bibasilar atelectasis. Trace left effusion. No pneumothorax. The cardiac silhouette is unremarkable. IMPRESSION: 1. Interval development of pneumoperitoneum, concerning for anastomotic breakdown given recent bowel surgery.  2. Low lung volumes, with bibasilar atelectasis and trace left effusion. Critical Value/emergent results were called by telephone at the time of interpretation on 05/04/2020 at 4:56 pm to provider [facility] , who verbally acknowledged these results. Electronically Signed   By: Randa Ngo M.D.   On: 04/28/2020 16:52   DG CHEST PORT 1 VIEW  Result Date: 05/13/2020 CLINICAL DATA:  Hypoxia EXAM: PORTABLE CHEST 1 VIEW COMPARISON:  None. FINDINGS: Low lung volumes with bibasilar atelectasis. Heart is normal size. No effusions or acute bony abnormality. IMPRESSION: Low lung volumes, bibasilar atelectasis. Electronically Signed   By: Rolm Baptise M.D.   On: 05/13/2020 08:32   DG Chest Portable 1 View  Result Date: 05/17/2020 CLINICAL DATA:  Weakness EXAM: PORTABLE CHEST 1 VIEW COMPARISON:  09/06/2016 chest radiograph and prior. FINDINGS: Hypoinflated lungs. Streaky bibasilar opacities. No pneumothorax. Blunting of the bilateral costophrenic sulci may reflect scarring versus trace effusions. Cardiomediastinal silhouette within normal limits. Multilevel spondylosis. IMPRESSION: Hypoinflated lungs.  Bibasilar linear opacities, likely atelectasis. Trace bilateral pleural effusions versus bibasilar scarring. Electronically Signed   By: Primitivo Gauze M.D.   On: 04/30/2020 14:03   DG Abd Portable 1V  Result Date: 05/11/2020 CLINICAL DATA:  Abdominal distension EXAM: PORTABLE ABDOMEN - 1 VIEW COMPARISON:  Abdominal CT from 8 days ago FINDINGS: Artifact from EKG leads. Gas distended structure in the central abdomen correlates with stomach by prior CT. No gas dilated small or large bowel seen. No concerning mass effect or gas collection. Limited study due to portable technique and soft tissue attenuation. IMPRESSION: Nonobstructive bowel gas pattern. No adverse change since CT 04/29/2020 Electronically Signed   By: Monte Fantasia M.D.   On: 05/11/2020 08:57   ECHOCARDIOGRAM COMPLETE  Result Date:  05/01/2020    ECHOCARDIOGRAM REPORT   Patient Name:   Regina Richards Date of Exam: 05/01/2020 Medical Rec #:  [id number]     Height:       62.0 in Accession #:    [id number]    Weight:       173.9 lb Date of Birth:  1946-01-14     BSA:          1.802 m Patient Age:    45 years      BP:           145/66 mmHg Patient Gender: F             HR:           72 bpm. Exam Location:  Inpatient Procedure: 2D Echo, Cardiac Doppler and Color Doppler Indications:    Murmur  History:        Patient has no prior history of Echocardiogram examinations.                 Signs/Symptoms:Shortness of Breath; Risk Factors:Hypertension.  Sonographer:    Clayton Lefort RDCS (AE) Referring Phys: [id number] Abigail Butts  Sonographer Comments: Limited patient mobility due to patient's scoliosis. Patient sitting at 45 degrees in bed for echocardiogram. IMPRESSIONS  1. Left ventricular ejection fraction, by estimation, is 60%. The left ventricle has normal function. The left ventricle has no regional wall motion abnormalities. There is mild left ventricular hypertrophy. Left ventricular diastolic parameters were normal.  2. Right ventricular systolic function is normal. The right ventricular size is normal.  3. Left atrial size was mildly dilated.  4. The mitral valve is grossly normal. Mild mitral valve regurgitation. No evidence of mitral stenosis.  5. Tricuspid valve regurgitation is mild to moderate.  6. The aortic valve is grossly normal. Aortic valve regurgitation is mild. No aortic stenosis is present. FINDINGS  Left Ventricle: Left ventricular ejection fraction, by estimation, is 60%. The left ventricle has normal function. The left ventricle has no regional wall motion abnormalities. The left ventricular internal cavity size was normal in size. There is mild left ventricular hypertrophy. Left ventricular diastolic parameters were normal. Right Ventricle: The right ventricular size is normal. No increase in right ventricular wall thickness.  Right ventricular systolic function is normal. Left Atrium: Left atrial size was mildly dilated. Right Atrium: Right atrial size was normal in size. Pericardium: There is no evidence of pericardial effusion. Mitral Valve: The mitral valve is grossly normal. Normal mobility of the mitral valve leaflets. Mild mitral valve regurgitation. No evidence of mitral valve stenosis. MV peak gradient, 7.1 mmHg. The mean mitral valve gradient is 2.0 mmHg. Tricuspid Valve: The tricuspid valve is grossly normal. Tricuspid valve regurgitation is mild to moderate. No evidence of tricuspid stenosis. Aortic Valve: The aortic valve is grossly normal. Aortic valve regurgitation is mild. Aortic regurgitation PHT measures 354 msec. No aortic stenosis is present. Aortic valve mean gradient measures 13.0 mmHg. Aortic valve peak gradient measures 26.4 mmHg.  Aortic valve area, by VTI measures 1.58 cm. Pulmonic Valve: The pulmonic valve was grossly normal. Pulmonic valve regurgitation is trivial. No evidence of pulmonic stenosis. Aorta: The aortic root and ascending aorta are structurally normal, with no evidence of dilitation. Venous: The inferior vena cava was not well visualized. IAS/Shunts: The interatrial septum was not well visualized.  LEFT VENTRICLE PLAX 2D LVIDd:         4.60 cm  Diastology LVIDs:         3.10 cm  LV e' lateral:   8.16 cm/s LV PW:         1.00 cm  LV E/e' lateral: 16.2 LV IVS:        1.00 cm  LV e' medial:    8.05 cm/s LVOT diam:     1.80 cm  LV E/e' medial:  16.4 LV SV:         83 LV SV Index:   46 LVOT Area:     2.54 cm  RIGHT VENTRICLE RV Basal diam:  3.00 cm RV S prime:     13.50 cm/s TAPSE (M-mode): 3.2 cm LEFT ATRIUM             Index       RIGHT ATRIUM           Index LA diam:        4.00 cm 2.22 cm/m  RA Area:     17.20 cm LA Vol (A2C):   65.6 ml 36.41 ml/m RA Volume:   41.80 ml  23.20 ml/m LA Vol (A4C):   57.7 ml 32.03 ml/m LA Biplane Vol: 61.7 ml 34.25 ml/m  AORTIC VALVE AV Area (Vmax):    1.51 cm  AV Area (Vmean):   1.53 cm AV Area (VTI):     1.58 cm AV Vmax:           257.00 cm/s AV Vmean:          168.000 cm/s AV VTI:            0.524 m AV Peak Grad:      26.4 mmHg AV Mean Grad:      13.0 mmHg LVOT Vmax:         153.00 cm/s LVOT Vmean:        101.000 cm/s LVOT VTI:          0.325 m LVOT/AV VTI ratio: 0.62 AI PHT:            354 msec  AORTA Ao Root diam: 2.80 cm Ao Asc diam:  2.80 cm MITRAL VALVE                TRICUSPID VALVE MV Area (PHT): 3.60 cm     TR Peak grad:   44.9 mmHg MV Peak grad:  7.1 mmHg     TR Vmax:        335.00 cm/s MV Mean grad:  2.0 mmHg MV Vmax:       1.33 m/s     SHUNTS MV Vmean:      62.9 cm/s    Systemic VTI:  0.32 m MV Decel Time: 211 msec     Systemic Diam: 1.80 cm MV E velocity: 132.00 cm/s MV A velocity: 91.50 cm/s MV E/A ratio:  1.44 Cherlynn Kaiser MD Electronically signed by Cherlynn Kaiser MD Signature Date/Time: 05/01/2020/2:42:52 PM    Final    ECHOCARDIOGRAM LIMITED  Result Date: 05/25/2020    ECHOCARDIOGRAM LIMITED REPORT   Patient Name:   Regina Richards Date of Exam: 05/25/2020 Medical Rec #:  [id number]     Height:       64.0 in Accession #:    [id number]    Weight:       175.3 lb Date of Birth:  1946-06-01     BSA:          1.850 m Patient Age:    67 years      BP:           154/61 mmHg Patient Gender: F             HR:           91 bpm. Exam Location:  Inpatient Procedure: 2D Echo Indications:    murmur 785.2  History:        Patient has prior history of  Echocardiogram examinations, most                 recent 05/01/2020. Risk Factors:Hypertension.  Sonographer:    Jannett Celestine RDCS (AE) Referring Phys: [id number] [facility]  Sonographer Comments: No apical window and suboptimal subcostal window. limited mobility. IMPRESSIONS  1. Limited study due to poor windows, poor patient mobility - off axis images  2. Left ventricular ejection fraction, by estimation, is 70 to 75%. The left ventricle has hyperdynamic function.  3. The mitral valve is grossly normal. Trivial  mitral valve regurgitation.  4. The aortic valve is tricuspid. Aortic valve regurgitation is mild. Mild aortic valve sclerosis is present, with no evidence of aortic valve stenosis. Aortic valve mean gradient measures 13.0 mmHg. The aortic valve leaflet excursion appears normal.  5. There is moderately elevated pulmonary artery systolic pressure. The estimated right ventricular systolic pressure is 64.4 mmHg.  6. The inferior vena cava is normal in size with greater than 50% respiratory variability, suggesting right atrial pressure of 3 mmHg. FINDINGS  Left Ventricle: Left ventricular ejection fraction, by estimation, is 70 to 75%. The left ventricle has hyperdynamic function. Right Ventricle: There is moderately elevated pulmonary artery systolic pressure. The tricuspid regurgitant velocity is 3.30 m/s, and with an assumed right atrial pressure of 3 mmHg, the estimated right ventricular systolic pressure is 03.4 mmHg. Mitral Valve: The mitral valve is grossly normal. Trivial mitral valve regurgitation. Tricuspid Valve: The tricuspid valve is grossly normal. Tricuspid valve regurgitation is trivial. Aortic Valve: The aortic valve is tricuspid. Aortic valve regurgitation is mild. Mild aortic valve sclerosis is present, with no evidence of aortic valve stenosis. Aortic valve mean gradient measures 13.0 mmHg. Aortic valve peak gradient measures 20.8 mmHg. Venous: The inferior vena cava is normal in size with greater than 50% respiratory variability, suggesting right atrial pressure of 3 mmHg. AORTIC VALVE AV Vmax:           228.00 cm/s AV Vmean:          174.000 cm/s AV VTI:            0.418 m AV Peak Grad:      20.8 mmHg AV Mean Grad:      13.0 mmHg LVOT Vmax:         149.00 cm/s LVOT Vmean:        114.000 cm/s LVOT VTI:          0.313 m LVOT/AV VTI ratio: 0.75 TRICUSPID VALVE TR Peak grad:   43.6 mmHg TR Vmax:        330.00 cm/s  SHUNTS Systemic VTI: 0.31 m Lyman Bishop MD Electronically signed by Lyman Bishop MD  Signature Date/Time: 05/25/2020/12:16:45 PM    Final    [place] EKG SITE RITE  Result Date: 05/19/2020 If Site Rite image not attached, placement could not be confirmed due to current cardiac rhythm.  [place] EKG SITE RITE  Result Date: 05/18/2020 If Site Rite image not attached, placement could not be confirmed due to current cardiac rhythm.   Microbiology No results found for this or any previous visit (from the past 240 hour(s)).  Lab Basic Metabolic Panel: Recent Labs  Lab 05/22/20 0356 05/22/20 0356 05/23/20 0337 05/24/20 0435 05/25/20 0430 05/26/20 0437 2020-06-19 0420  NA 143  --  145 147* 149* 145  --   K 4.0  --  3.9 4.6 4.7 4.7  --   CL 109  --  110 114* 118* 116*  --   CO2  30  --  28 27 25 24   --   GLUCOSE 120*  --  137* 121* 120* 124*  --   BUN 44*  --  44* 51* 49* 50*  --   CREATININE 1.26*  --  1.06* 1.02* 0.89 0.83  --   CALCIUM 8.5*  --  8.2* 8.7* 8.7* 8.8*  --   MG  --   --  1.5* 2.2  --  1.8 2.1  PHOS 2.7   < > 3.1 2.7 2.8 2.7 5.7*   < > = values in this interval not displayed.   Liver Function Tests: Recent Labs  Lab 05/22/20 0356 05/23/20 0337 05/24/20 0435 05/25/20 0430 05/26/20 0437  AST  --  29 36 32  --   ALT  --  16 16 16   --   ALKPHOS  --  85 123 129*  --   BILITOT  --  0.7 0.8 0.7  --   PROT  --  5.3* 5.9* 5.3*  --   ALBUMIN 3.3* 3.0*  2.9* 3.1* 2.7* 2.5*   No results for input(s): LIPASE, AMYLASE in the last 168 hours. Recent Labs  Lab 05/23/20 0805 05/25/20 0430  AMMONIA 44* 28   CBC: Recent Labs  Lab 05/22/20 0356 05/23/20 0337 05/24/20 0435 05/25/20 0430  WBC 10.9* 10.6* 15.4* 9.5  NEUTROABS  --   --  12.5*  --   HGB 10.3* 10.1* 11.0* 9.7*  HCT 33.6* 34.0* 37.3 34.5*  MCV 94.6 97.4 99.5 99.7  PLT 109* 83* 91* 69*   Cardiac Enzymes: No results for input(s): CKTOTAL, CKMB, CKMBINDEX, TROPONINI in the last 168 hours. Sepsis Labs: Recent Labs  Lab 05/22/20 0356 05/23/20 0337 05/24/20 0435 05/25/20 0430 05/26/20 0437   PROCALCITON  --   --   --  0.40 0.30  WBC 10.9* 10.6* 15.4* 9.5  --   LATICACIDVEN  --   --   --  1.0  --     Procedures/Operations  As above   Avyay Coger 05/28/2020, 8:04 AM

## 2021-03-21 IMAGING — CR DG CHEST 2V
2 series · 2 of 2 positions shown · non-contrast
Comparison: I 321

CLINICAL DATA: Fever today; underwent exploratory laparotomy and
partial colectomy on 05/06/2020

EXAM:
CHEST - 2 VIEW

[chest lat]
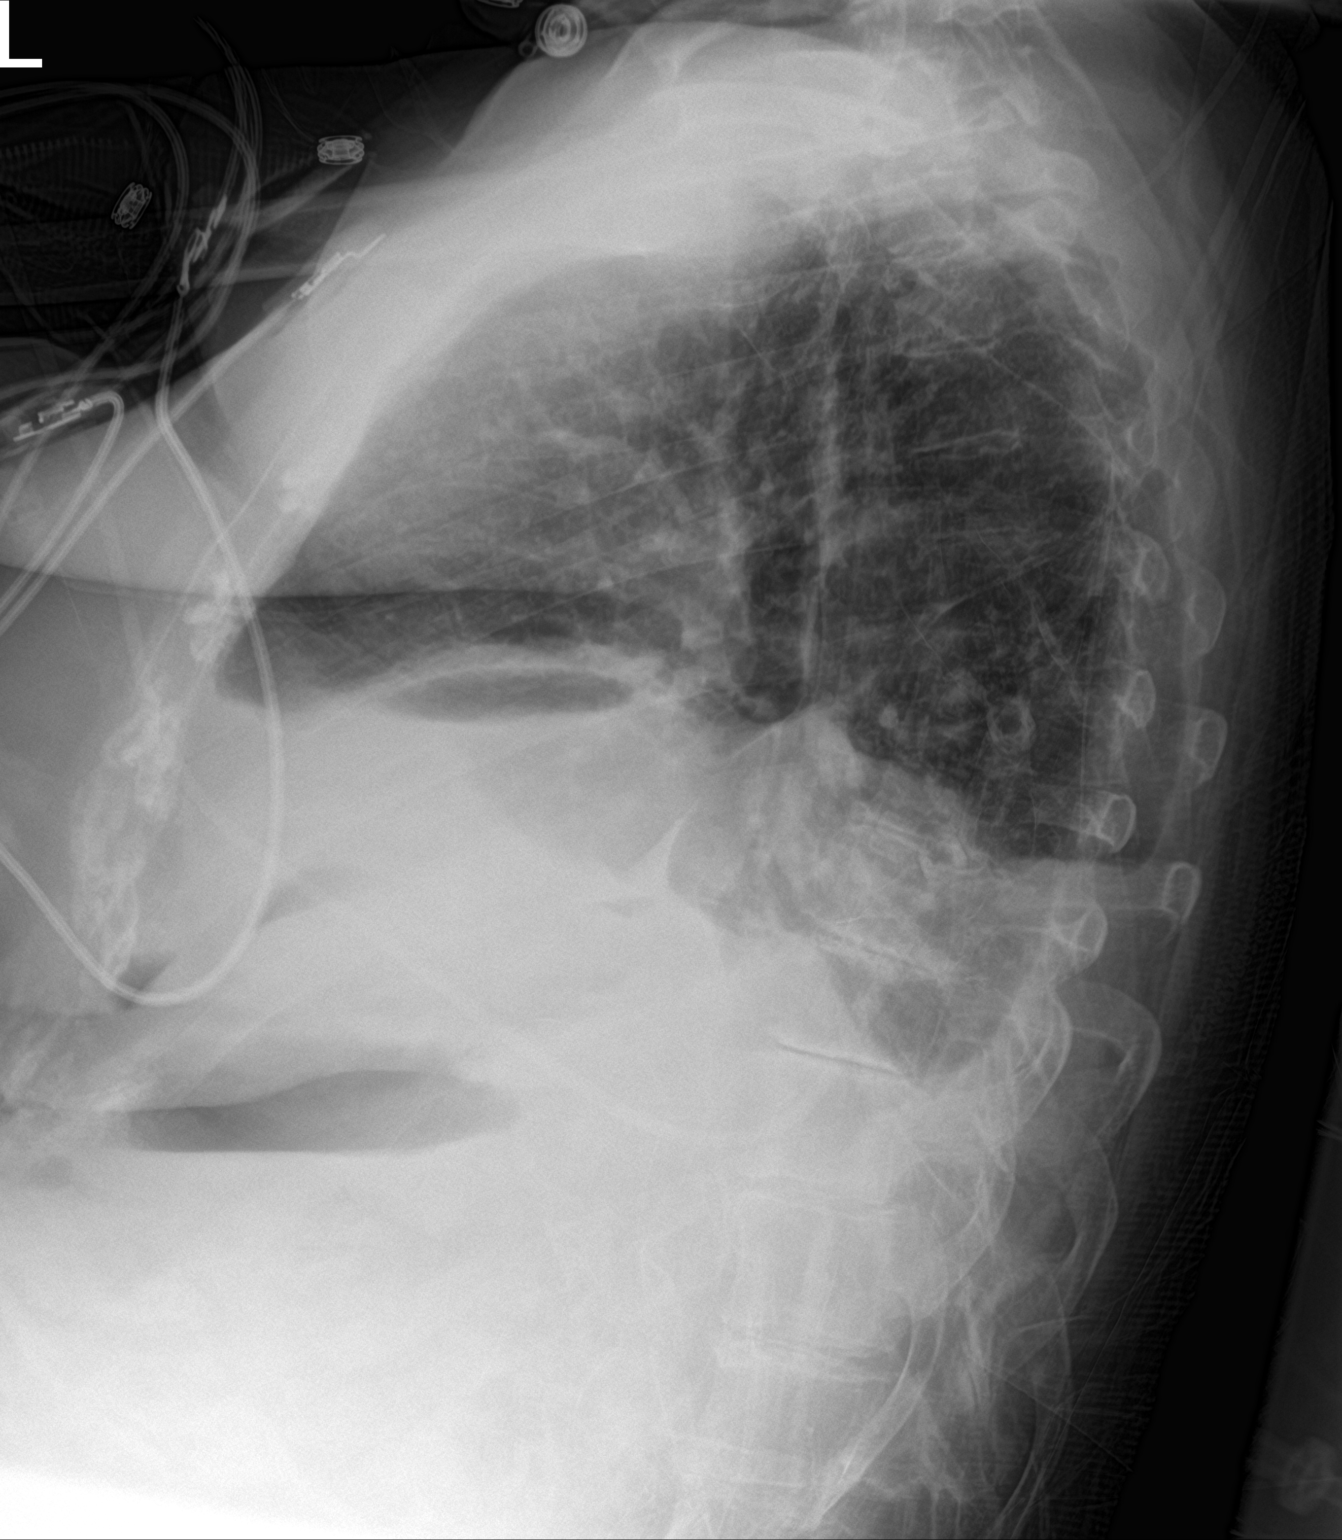

[chest ap]
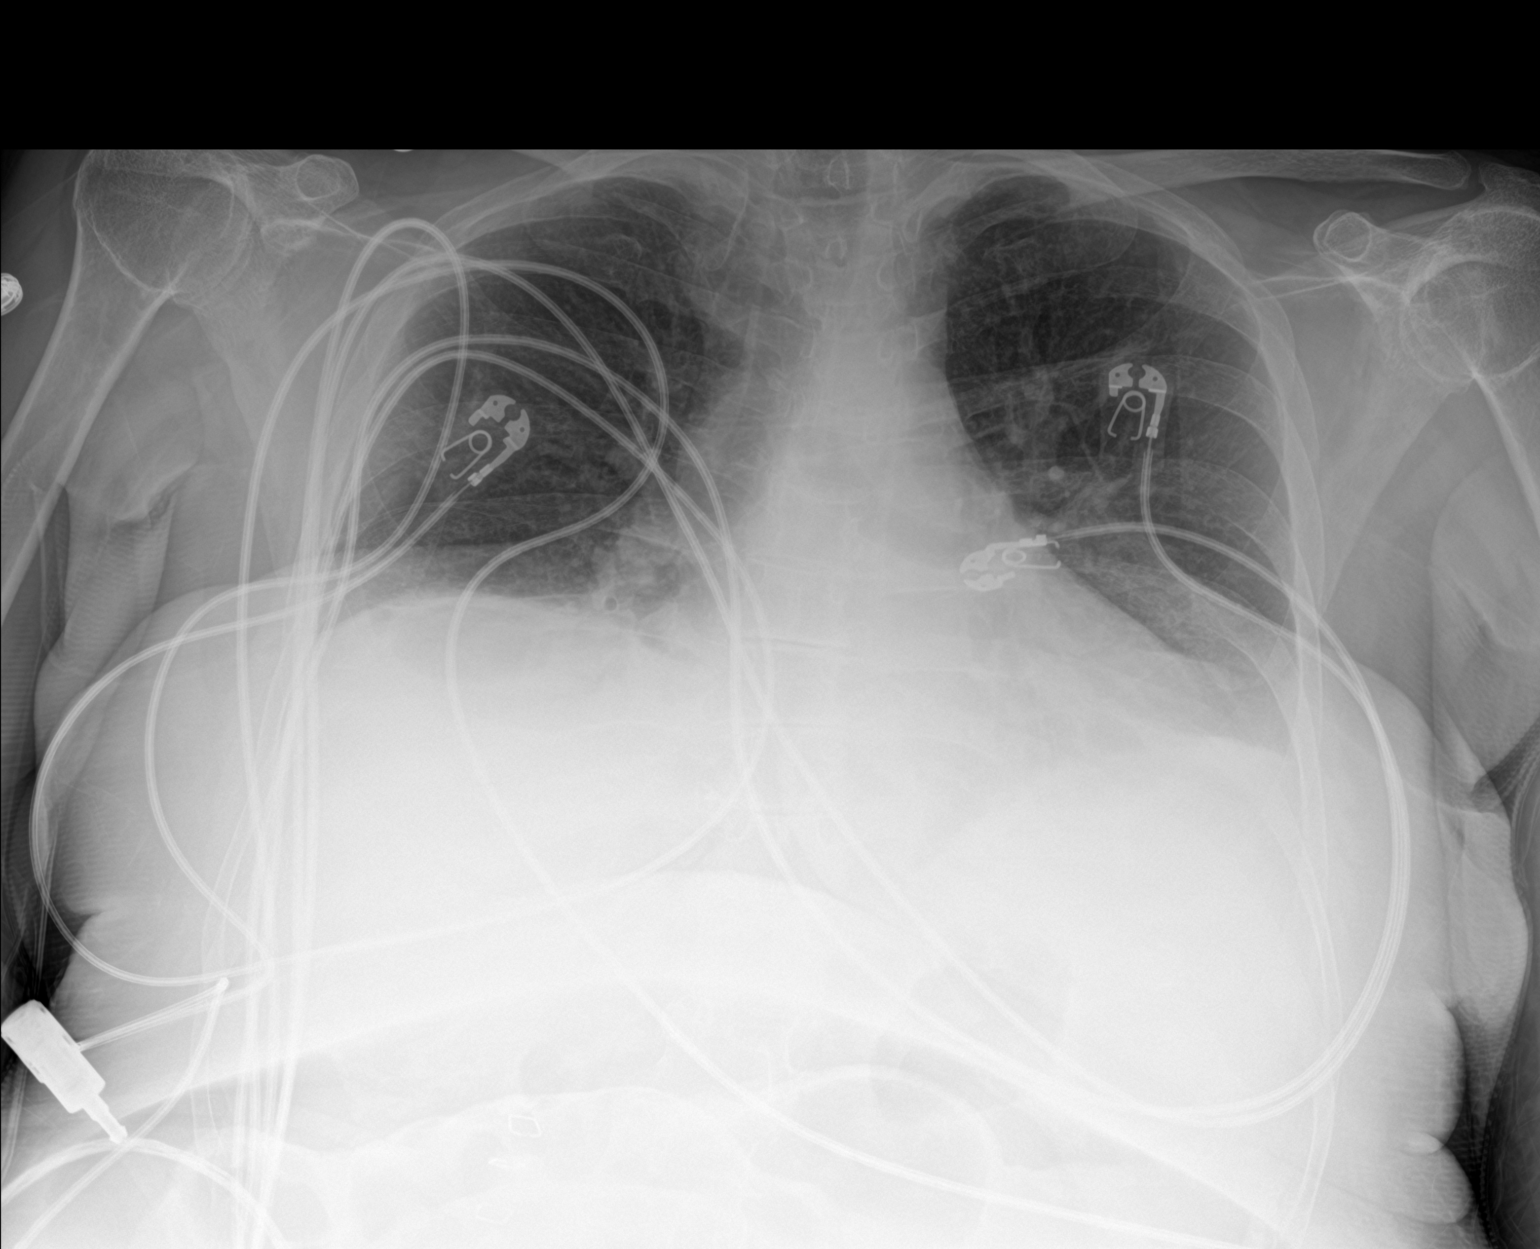

[2 of 2 positions shown; findings below may reference images not displayed]

FINDINGS: Enlargement of cardiac silhouette with slight vascular congestion.

Mediastinal contours normal.

Bibasilar pleural effusions and atelectasis greater on RIGHT.

Elevation of RIGHT diaphragm.

Free air under the RIGHT diaphragm and outlining a bowel loop in the
mid abdomen, likely related to exploratory laparotomy 3 days ago.

Upper lungs clear.

No pneumothorax or acute osseous findings.
IMPRESSION: Bibasilar effusions and atelectasis.

Elevated RIGHT diaphragm with free intraperitoneal air likely
related to recent laparotomy 3 days ago.

## 2021-03-29 IMAGING — DX DG CHEST 1V PORT
1 series · 1 of 1 positions shown · non-contrast
Comparison: 05/09/2020

CLINICAL DATA: Colon cancer status post recent colectomy, severe
abdominal pain, shortness of breath

EXAM:
PORTABLE CHEST 1 VIEW

[chest]
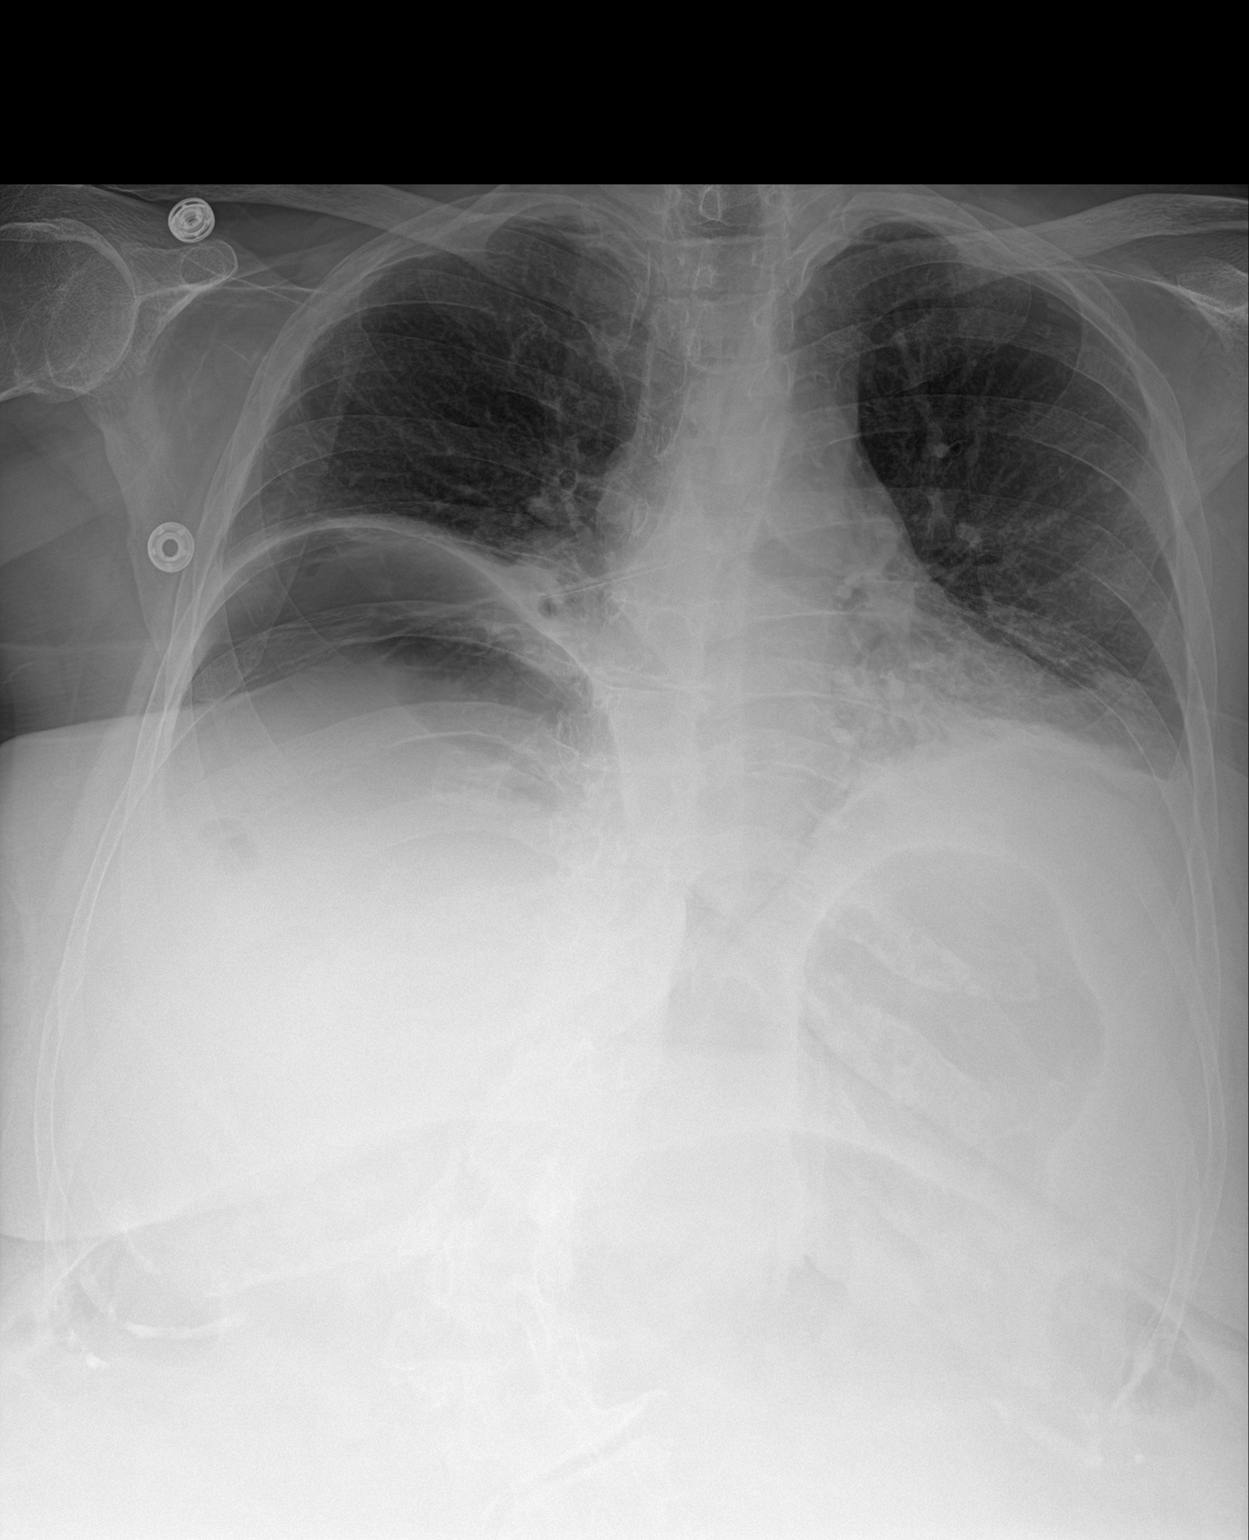

[1 of 1 positions shown; findings below may reference images not displayed]

FINDINGS: Upright frontal view of the chest demonstrates interval development
of pneumoperitoneum most pronounced under the right hemidiaphragm.
Lung volumes are diminished, with bibasilar atelectasis. Trace left
effusion. No pneumothorax. The cardiac silhouette is unremarkable.
IMPRESSION: 1. Interval development of pneumoperitoneum, concerning for
anastomotic breakdown given recent bowel surgery.
2. Low lung volumes, with bibasilar atelectasis and trace left
effusion.

Critical Value/emergent results were called by telephone at the time
of interpretation on 05/17/2020 at [DATE] to provider KATTIA
ESKIL , who verbally acknowledged these results.

## 2021-03-30 IMAGING — DX DG CHEST 1V
1 series · 1 of 1 positions shown · non-contrast
Comparison: 05/17/2020

CLINICAL DATA: Central line placement

EXAM:
CHEST  1 VIEW

[chest ap]
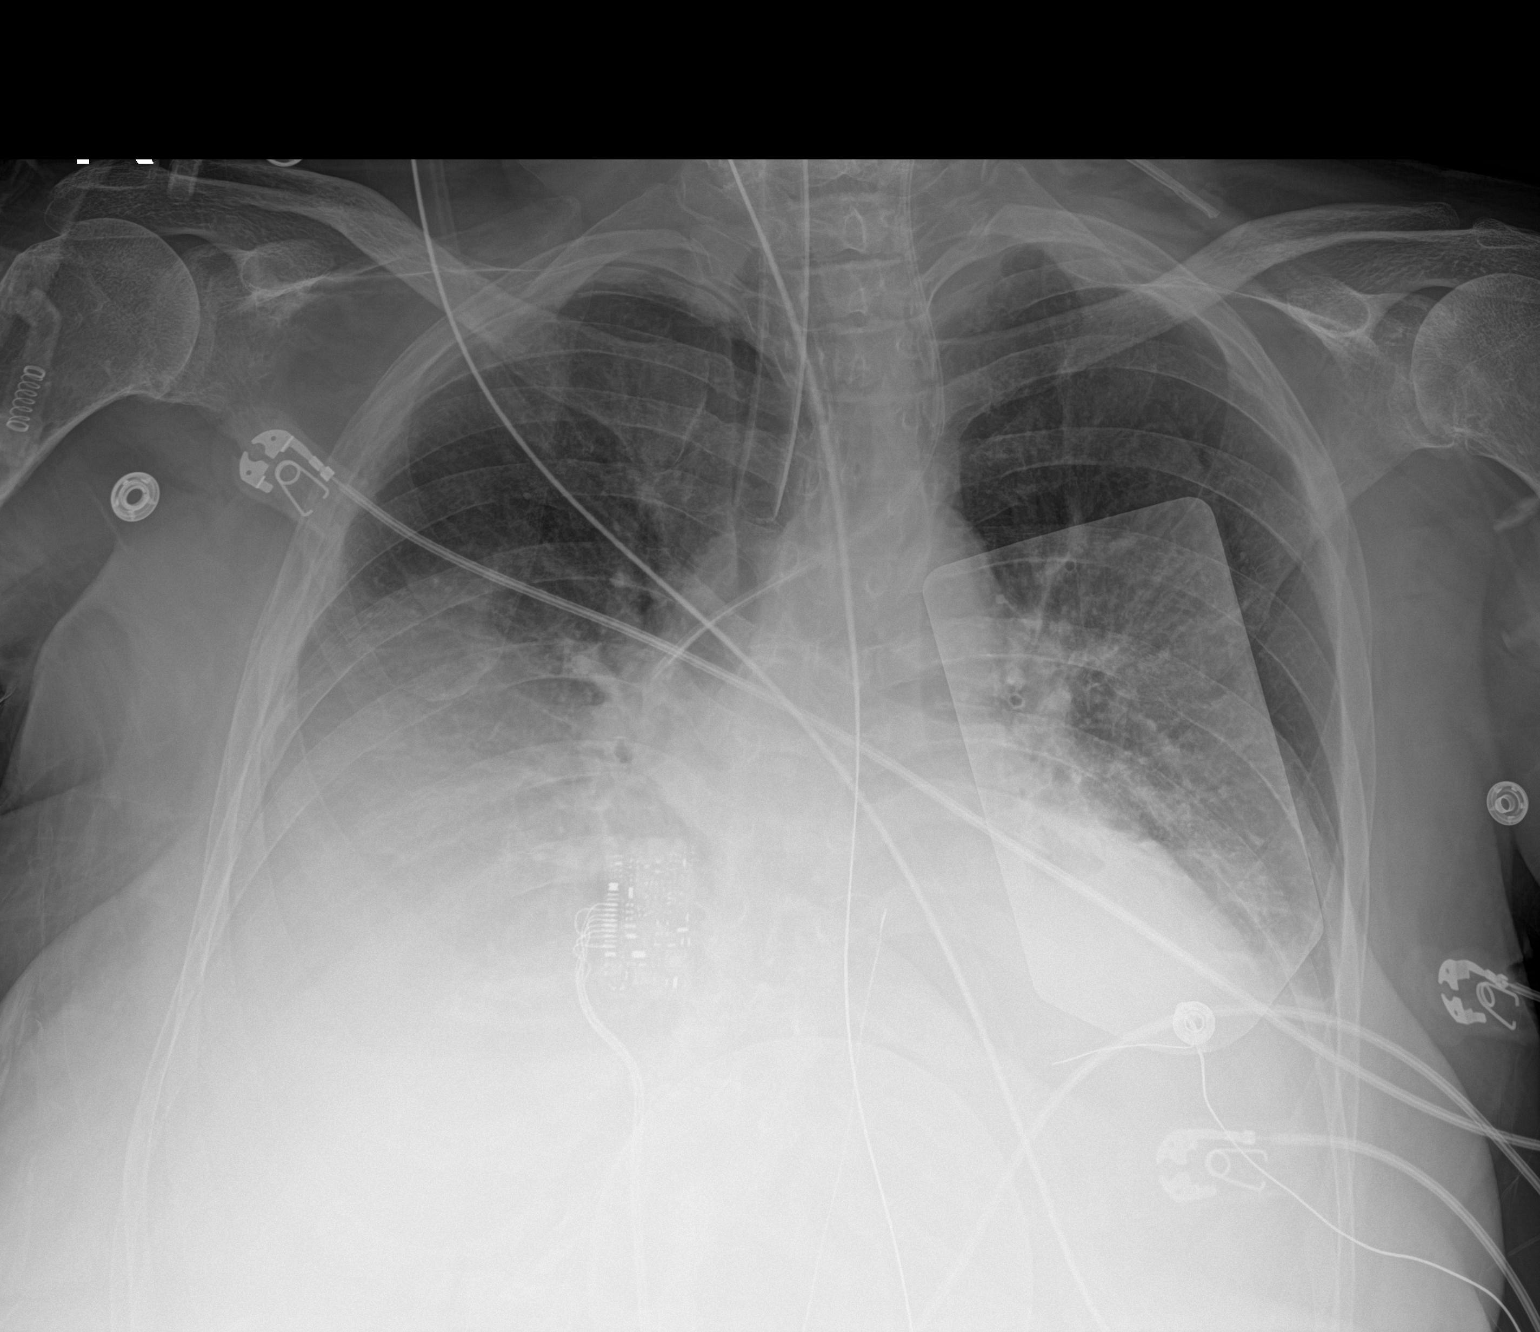

[1 of 1 positions shown; findings below may reference images not displayed]

FINDINGS: Interval placement of left neck vascular catheter, tip projecting
over the superior portion of the SVC. Otherwise unchanged AP
portable examination with layering bilateral pleural effusions,
endotracheal tube, esophagogastric tube.
IMPRESSION: 1. Interval placement of left neck vascular catheter, tip projecting
over the superior portion of the SVC.

2.  Otherwise unchanged AP portable examination.

## 2021-04-01 IMAGING — DX DG ABDOMEN 1V
1 series · 2 of 2 positions shown · non-contrast
Comparison: May 11, 2020

CLINICAL DATA: Evaluate nasogastric tube placement.

EXAM:
ABDOMEN - 1 VIEW

[Series 1: abdomen · 0.14mm/px · 2 of 2 slices shown]
[im 1/2]
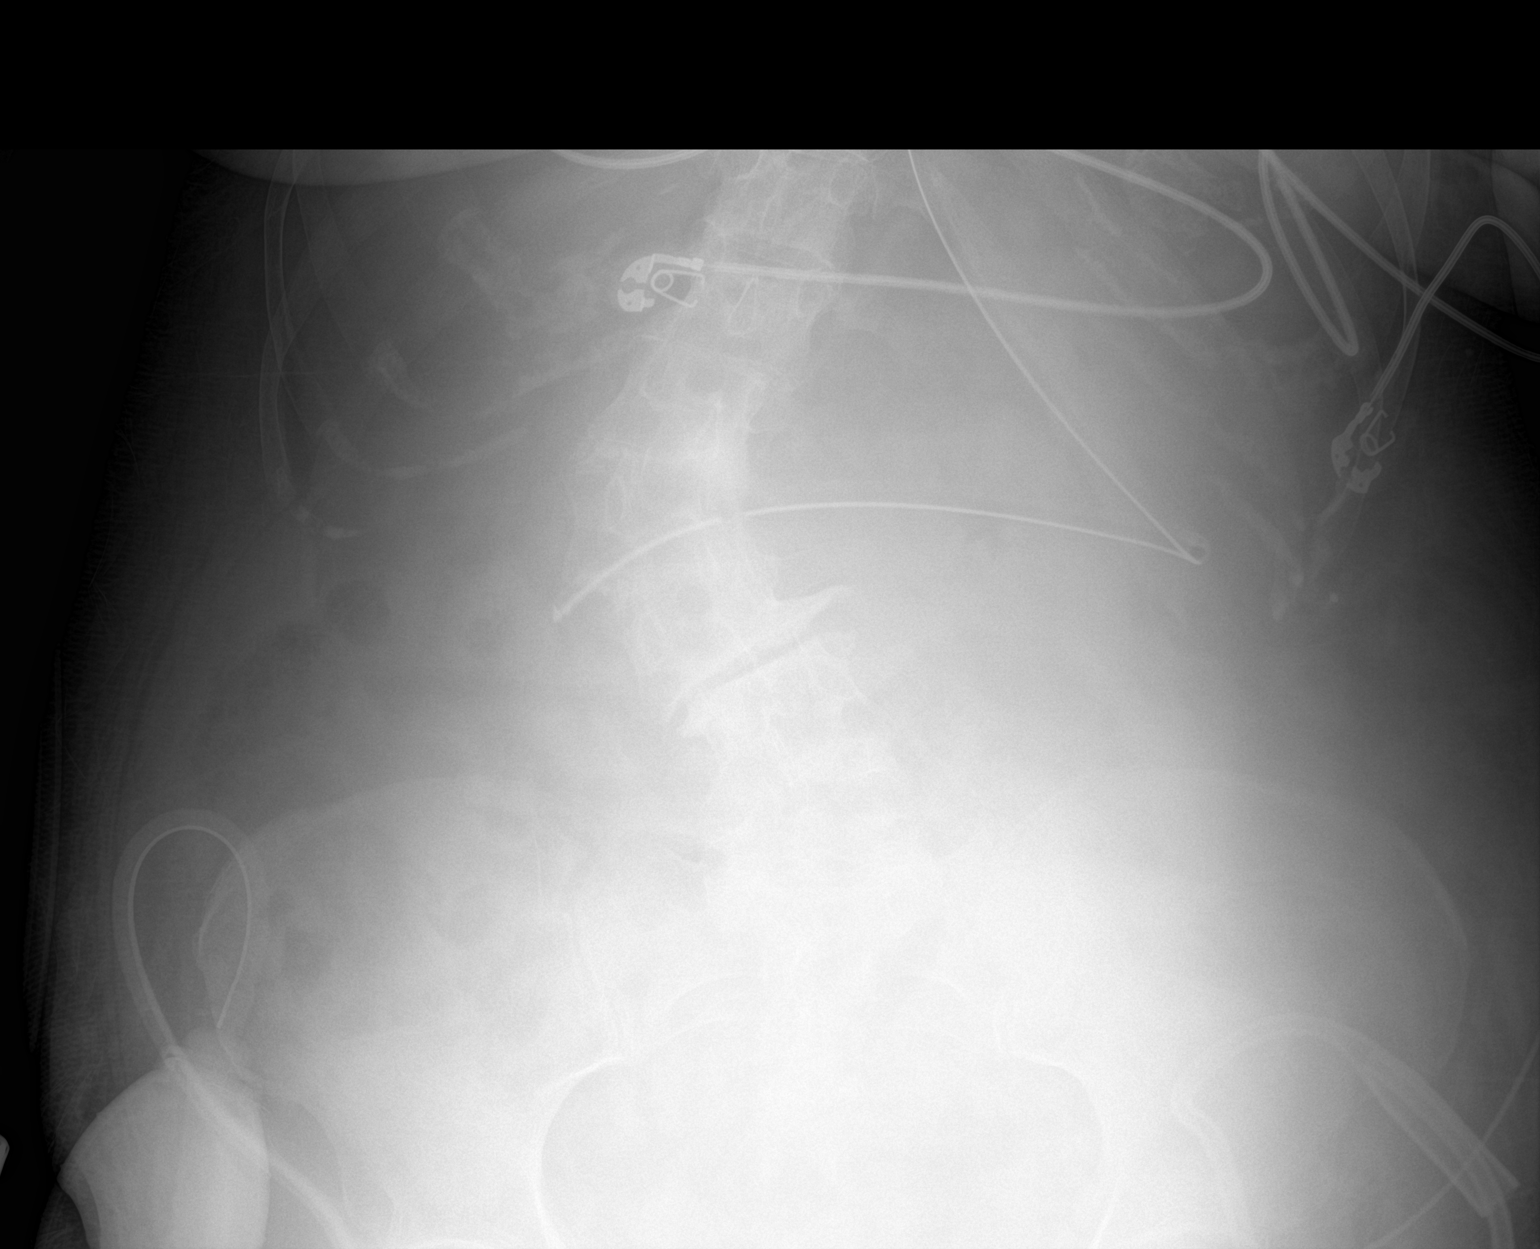
[im 2/2]
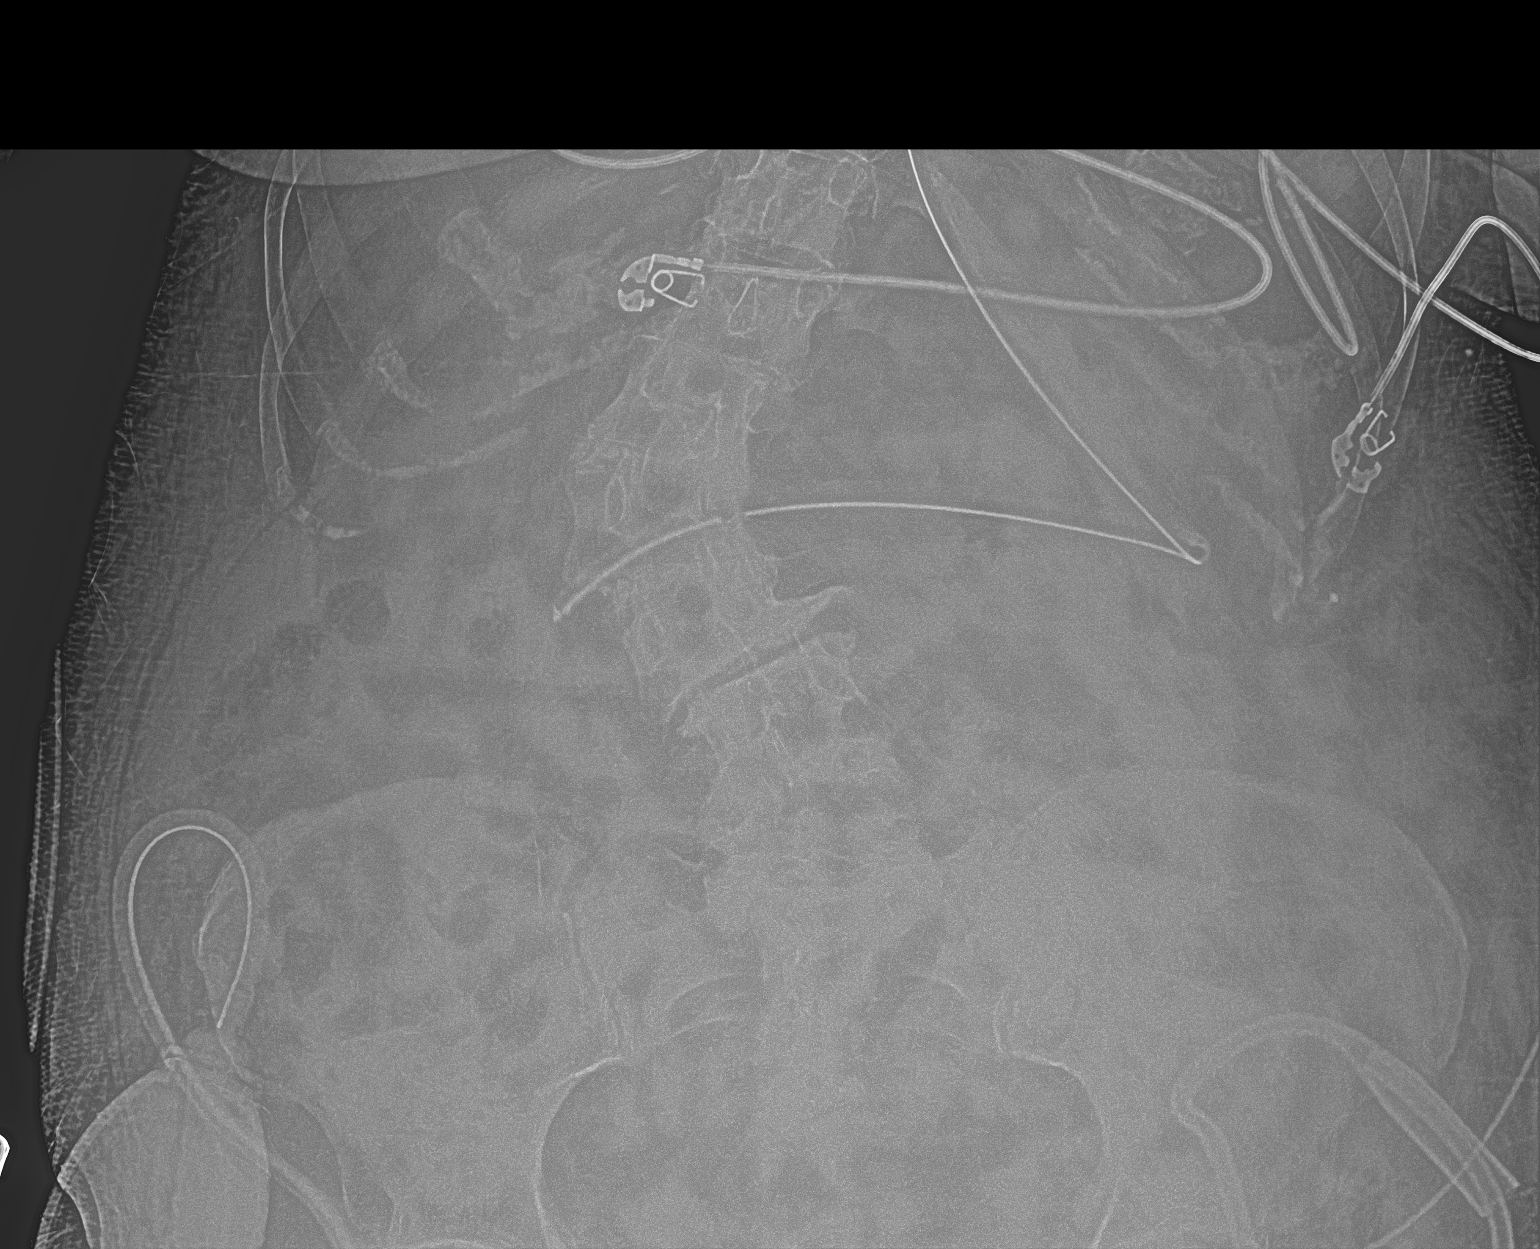

[2 of 2 positions shown; findings below may reference images not displayed]

FINDINGS: Interval insertion of gastric decompression tube with the tip near
the gastric antrum. Nonobstructive bowel gas pattern. Similar
appearing marked dextroconvex lumbar scoliotic curvature.
IMPRESSION: Gastric tube tip and proximal side hole are in the gastric antrum.
